# Patient Record
Sex: Female | Born: 1935 | Race: White | Hispanic: No | Marital: Married | Smoking: Never smoker
Health system: Southern US, Community
[De-identification: ages and names within clinical notes are randomized; demographics above are authoritative.]

## PROBLEM LIST (undated history)

## (undated) ENCOUNTER — Ambulatory Visit: Admission: EM | Payer: Medicare Other

## (undated) DIAGNOSIS — N183 Chronic kidney disease, stage 3 unspecified: Secondary | ICD-10-CM

## (undated) DIAGNOSIS — I499 Cardiac arrhythmia, unspecified: Secondary | ICD-10-CM

## (undated) DIAGNOSIS — Z974 Presence of external hearing-aid: Secondary | ICD-10-CM

## (undated) DIAGNOSIS — K219 Gastro-esophageal reflux disease without esophagitis: Secondary | ICD-10-CM

## (undated) DIAGNOSIS — K449 Diaphragmatic hernia without obstruction or gangrene: Secondary | ICD-10-CM

## (undated) DIAGNOSIS — N189 Chronic kidney disease, unspecified: Secondary | ICD-10-CM

## (undated) DIAGNOSIS — Z950 Presence of cardiac pacemaker: Secondary | ICD-10-CM

## (undated) DIAGNOSIS — C801 Malignant (primary) neoplasm, unspecified: Secondary | ICD-10-CM

## (undated) DIAGNOSIS — Z8679 Personal history of other diseases of the circulatory system: Secondary | ICD-10-CM

## (undated) DIAGNOSIS — I442 Atrioventricular block, complete: Secondary | ICD-10-CM

## (undated) DIAGNOSIS — R0600 Dyspnea, unspecified: Secondary | ICD-10-CM

## (undated) DIAGNOSIS — M1990 Unspecified osteoarthritis, unspecified site: Secondary | ICD-10-CM

## (undated) DIAGNOSIS — K224 Dyskinesia of esophagus: Secondary | ICD-10-CM

## (undated) DIAGNOSIS — I469 Cardiac arrest, cause unspecified: Secondary | ICD-10-CM

## (undated) DIAGNOSIS — R011 Cardiac murmur, unspecified: Secondary | ICD-10-CM

## (undated) DIAGNOSIS — B029 Zoster without complications: Secondary | ICD-10-CM

## (undated) DIAGNOSIS — C4372 Malignant melanoma of left lower limb, including hip: Secondary | ICD-10-CM

## (undated) DIAGNOSIS — C50919 Malignant neoplasm of unspecified site of unspecified female breast: Secondary | ICD-10-CM

## (undated) DIAGNOSIS — R609 Edema, unspecified: Secondary | ICD-10-CM

## (undated) DIAGNOSIS — I4891 Unspecified atrial fibrillation: Secondary | ICD-10-CM

## (undated) DIAGNOSIS — I495 Sick sinus syndrome: Secondary | ICD-10-CM

## (undated) DIAGNOSIS — I2510 Atherosclerotic heart disease of native coronary artery without angina pectoris: Secondary | ICD-10-CM

## (undated) DIAGNOSIS — E785 Hyperlipidemia, unspecified: Secondary | ICD-10-CM

## (undated) DIAGNOSIS — I509 Heart failure, unspecified: Secondary | ICD-10-CM

## (undated) DIAGNOSIS — Z9889 Other specified postprocedural states: Secondary | ICD-10-CM

## (undated) DIAGNOSIS — Z7902 Long term (current) use of antithrombotics/antiplatelets: Secondary | ICD-10-CM

## (undated) DIAGNOSIS — G453 Amaurosis fugax: Secondary | ICD-10-CM

## (undated) DIAGNOSIS — H9190 Unspecified hearing loss, unspecified ear: Secondary | ICD-10-CM

## (undated) DIAGNOSIS — D71 Functional disorders of polymorphonuclear neutrophils: Secondary | ICD-10-CM

## (undated) DIAGNOSIS — B019 Varicella without complication: Secondary | ICD-10-CM

## (undated) DIAGNOSIS — I219 Acute myocardial infarction, unspecified: Secondary | ICD-10-CM

## (undated) DIAGNOSIS — I252 Old myocardial infarction: Secondary | ICD-10-CM

## (undated) HISTORY — PX: CHOLECYSTECTOMY: SHX55

## (undated) HISTORY — DX: Unspecified atrial fibrillation: I48.91

## (undated) HISTORY — PX: CARDIAC CATHETERIZATION: SHX172

## (undated) HISTORY — PX: PACEMAKER INSERTION: SHX728

## (undated) HISTORY — PX: OTHER SURGICAL HISTORY: SHX169

## (undated) HISTORY — PX: DILATION AND CURETTAGE OF UTERUS: SHX78

## (undated) HISTORY — PX: ATRIAL ABLATION SURGERY: SHX560

## (undated) HISTORY — PX: ESOPHAGOGASTRODUODENOSCOPY: SHX1529

## (undated) HISTORY — PX: COLONOSCOPY: SHX174

## (undated) HISTORY — DX: Atherosclerotic heart disease of native coronary artery without angina pectoris: I25.10

## (undated) HISTORY — PX: BREAST SURGERY: SHX581

## (undated) HISTORY — PX: ROTATOR CUFF REPAIR: SHX139

## (undated) HISTORY — DX: Old myocardial infarction: I25.2

## (undated) HISTORY — PX: TONSILLECTOMY: SUR1361

---

## 2009-08-23 DIAGNOSIS — I214 Non-ST elevation (NSTEMI) myocardial infarction: Secondary | ICD-10-CM

## 2009-08-23 HISTORY — DX: Non-ST elevation (NSTEMI) myocardial infarction: I21.4

## 2009-10-23 DIAGNOSIS — I469 Cardiac arrest, cause unspecified: Secondary | ICD-10-CM

## 2009-10-23 HISTORY — DX: Cardiac arrest, cause unspecified: I46.9

## 2011-11-24 ENCOUNTER — Emergency Department: Payer: Self-pay | Admitting: Emergency Medicine

## 2011-11-24 ENCOUNTER — Ambulatory Visit: Payer: Self-pay

## 2011-11-24 LAB — COMPREHENSIVE METABOLIC PANEL
Albumin: 3.3 g/dL — ABNORMAL LOW (ref 3.4–5.0)
Alkaline Phosphatase: 95 U/L (ref 50–136)
Anion Gap: 9 (ref 7–16)
Bilirubin,Total: 0.3 mg/dL (ref 0.2–1.0)
Calcium, Total: 8.6 mg/dL (ref 8.5–10.1)
Co2: 28 mmol/L (ref 21–32)
Creatinine: 0.74 mg/dL (ref 0.60–1.30)
EGFR (African American): >60
EGFR (Non-African Amer.): >60
Osmolality: 290 (ref 275–301)

## 2011-11-24 LAB — CBC
HCT: 42.6 % (ref 35.0–47.0)
HGB: 14.3 g/dL (ref 12.0–16.0)
MCHC: 33.5 g/dL (ref 32.0–36.0)
MCV: 95 fL (ref 80–100)
RDW: 13.6 % (ref 11.5–14.5)
WBC: 6 10*3/uL (ref 3.6–11.0)

## 2011-11-24 LAB — CK TOTAL AND CKMB (NOT AT ARMC): CK-MB: 2.4 ng/mL (ref 0.5–3.6)

## 2011-11-27 ENCOUNTER — Ambulatory Visit (INDEPENDENT_AMBULATORY_CARE_PROVIDER_SITE_OTHER): Payer: Medicare Other | Admitting: Cardiovascular Disease

## 2011-11-27 ENCOUNTER — Encounter: Payer: Self-pay | Admitting: Cardiovascular Disease

## 2011-11-27 VITALS — BP 135/81 | HR 120 | Ht 69.0 in | Wt 177.0 lb

## 2011-11-27 DIAGNOSIS — I2510 Atherosclerotic heart disease of native coronary artery without angina pectoris: Secondary | ICD-10-CM | POA: Insufficient documentation

## 2011-11-27 DIAGNOSIS — I4891 Unspecified atrial fibrillation: Secondary | ICD-10-CM | POA: Insufficient documentation

## 2011-11-27 MED ORDER — METOPROLOL TARTRATE 50 MG PO TABS: 50.0000 mg | ORAL_TABLET | Freq: Two times a day (BID) | ORAL | Status: DC

## 2011-11-27 MED ORDER — RIVAROXABAN 20 MG PO TABS: 20.0000 mg | ORAL_TABLET | ORAL | Status: DC

## 2011-11-27 NOTE — Patient Instructions (Addendum)
Your physician wants you to follow-up in: 1 month with Dr. Elease Hashimoto. You will receive a reminder letter in the mail two months in advance. If you don't receive a letter, please call our office to schedule the follow-up appointment.  Your physician recommends that you return for lab work in 1 month 1 week prior to appointment.  Make sure you are fasting.  Nothing to eat/drink after midnight.   Your physician has recommended you make the following change in your medication:   1)stop cardizem  2)stop sotolol  3)start metoprolol 50 mg twice daily  4)start xeralto 20 mg daily  Check BP daily and call us with readings.

## 2011-11-27 NOTE — Assessment & Plan Note (Signed)
Charlotte Glover presents with rapid atrial fibrillation. She has a long history of atrial ablation controlled with sotalol in the past but she is not as well controlled. She's tried taking extra doses of sotalol but did not convert. She was seen in the [facility] emergency room this past weekend.  She's not very symptomatically. I would favor anticoagulation with rate control. She's never tried rate control past.  We discussed all the various options including amiodarone, Tikosyn,  rate control with anticoagulation, and a A. fib ablation. She enjoys getting out playing golf and has not been all that symptomatic playing golf.  We'll start her on metoprolol 50 mg twice a day. She'll discontinue the Cardizem since she's out of it anyway. She'll discontinue the sotalol. We will start her on Xarelto 20 mg a day.  She will take her blood pressure and heart rate on regular basis. I'll see her again in one month for an office visit, fasting labs, and EKG.

## 2011-11-27 NOTE — Progress Notes (Signed)
Charlotte Glover Date of Birth  1928/03/19       [facility]    [place] [address]  [address] [place], [place]  [address]   [place], [place]  [address] [id number]     [phone]   Fax  [phone]    Fax [phone]  Problem List: 1. Coronary artery disease- history of MI( March 2011) 2. History of atrial fibrillation- dx May, 2009 - fairly well controlled on sotalol 3. Cardiac arrest - May 2011-  her husband resuscitated her. Apparently there was no etiology found for cardiac arrest. 3. Hyperlipidemia 4. Hypertension 5. Cholecystectomy  History of Present Illness:  Pt has a hx of CAD and A-fib in the past.  She recently moved from [place], [place].    She went back into A-Fib this weekend and came to [facility] for eval.  She took an extra Sotolol 40 mg but did not convert. She took  160 mg of Sotolol but still did not convert.  She has some fatigue but otherwise does not have any significant problems with the a-Fib.  Has a history of a myocardial infarction in the past but she's not had any episodes of chest pain.  Current Outpatient Prescriptions on File Prior to Visit  Medication Sig Dispense Refill  . diltiazem (CARDIZEM) 60 MG tablet Take 60 mg by mouth daily.      . Diphenhydramine-APAP, sleep, (TYLENOL PM EXTRA STRENGTH PO) Take by mouth as needed.      Marland Kitchen esomeprazole (NEXIUM) 20 MG packet Take 20 mg by mouth daily before breakfast.      . furosemide (LASIX) 40 MG tablet Take 40 mg by mouth daily.      . pravastatin (PRAVACHOL) 40 MG tablet Take 40 mg by mouth daily.      . sotalol (BETAPACE) 80 MG tablet Take 80 mg by mouth 2 (two) times daily.        No Known Allergies  Past Medical History  Diagnosis Date  . Hypertension   . Hyperlipidemia   . Coronary artery disease   . Atrial fibrillation   . History of MI (myocardial infarction)     Past Surgical History  Procedure Date  . Cardiac catheterization   .  Rotator cuff repair   . Cholecystectomy     History  Smoking status  . Former Smoker  . Types: Cigarettes  Smokeless tobacco  . Not on file    History  Alcohol Use  . 0.6 oz/week  . 1 Glasses of wine per week    daily    History reviewed. No pertinent family history.  Reviw of Systems:  Reviewed in the HPI.  All other systems are negative.  Physical Exam: Blood pressure 135/81, pulse 120, height 5\' 9"  (1.753 m), weight 177 lb (80.287 kg). General: Well developed, well nourished, in no acute distress.  Head: Normocephalic, atraumatic, sclera non-icteric, mucus membranes are moist,   Neck: Supple. Carotids are 2 + without bruits. No JVD  Lungs: Clear bilaterally to auscultation.  Heart: irregularly irregular.  normal  S1 S2. No murmurs, gallops or rubs. Her heart rate is tachycardic.  Abdomen: Soft, non-tender, non-distended with normal bowel sounds. No hepatomegaly. No rebound/guarding. No masses.  Msk:  Strength and tone are normal  Extremities: No clubbing or cyanosis. No edema.  Distal pedal pulses are 2+ and equal bilaterally.  Neuro: Alert and oriented X 3. Moves all extremities spontaneously.  Psych:  Responds to  questions appropriately with a normal affect.  ECG: November 24, 2011, a-Fib with rapid vent. Rate.  Assessment / Plan:

## 2011-11-28 ENCOUNTER — Telehealth: Payer: Self-pay | Admitting: Cardiovascular Disease

## 2011-11-28 NOTE — Telephone Encounter (Signed)
Pt aware of Dr. Harvie Bridge recommendations regarding lasix and agrees with plan Mylo Red RN

## 2011-11-28 NOTE — Telephone Encounter (Signed)
She may double her lasix today and tomorrow.  Then return to normal dose.

## 2011-11-28 NOTE — Telephone Encounter (Signed)
New msg Pt wants to talk to you about swelling in her legs. She wants to know should she double her lasix med.

## 2011-11-28 NOTE — Telephone Encounter (Signed)
I spoke with pt husband. Pt is at another md appt. She has had a weight gain of 3 pounds overnight and increased swelling of the ankles. Husband states pt is not short of breath and no angina. "she feels fine".  0700       BP 119/80 HR 100 1000 am  BP 140/76 HR 98 Pt is not sure if she should take another lasix. She will be home later today. I will forward to Dr. Elease Hashimoto for review. Mylo Red RN

## 2011-12-03 ENCOUNTER — Telehealth: Payer: Self-pay | Admitting: Cardiovascular Disease

## 2011-12-03 NOTE — Telephone Encounter (Signed)
Pt calling concerning her Bp and pulse rate would like to speak to a nurse

## 2011-12-03 NOTE — Telephone Encounter (Signed)
Pt was seen last week by Dr. Elease Hashimoto. Some changes were made to her meds and she was told to keep a log of BP and HR.  She is calling with some concerns re: both.  She tells me she wakes up daily with HR=120's.  She takes meds (metoprolol 50 BID) and HR decreases slightly. It then jumps back up to 120's in the PM.  She also says her DBP stays above 100 (110-115) since med changes as well.  She denies h/a, blurred vision, slurred speech. Her only complaint is "slight dizziness". She is anxious to start playing golf again and start exercising.  I told her I would review these with Dr. Elease Hashimoto and call her back. Understanding verb.

## 2011-12-04 ENCOUNTER — Other Ambulatory Visit: Payer: Self-pay

## 2011-12-04 MED ORDER — DILTIAZEM HCL ER COATED BEADS 120 MG PO CP24: 120.0000 mg | ORAL_CAPSULE | Freq: Every day | ORAL | Status: DC

## 2011-12-04 NOTE — Telephone Encounter (Signed)
Discussed with Dr. Mariah Milling who suggests resuming cardizem 120 mg daily, have pt monitor BP/HR closely. If BP/HR cannot tolerate med change, we may need to change metoprolol dose.  He also wants to make sure pt has f/u appt scheduled with Dr. Elease Hashimoto.  Will call pt to advise.

## 2011-12-04 NOTE — Telephone Encounter (Signed)
Discussed with pt. Understanding verb. RX sent to pharm.  She will monitor BP and HR and call us if she cannot tolerate med change.

## 2011-12-04 NOTE — Telephone Encounter (Signed)
Fu call °Patient calling back about this issue °

## 2011-12-04 NOTE — Telephone Encounter (Signed)
Has appt with Dr. Elease Hashimoto in July

## 2011-12-25 ENCOUNTER — Other Ambulatory Visit: Payer: Medicare Other

## 2012-01-07 ENCOUNTER — Other Ambulatory Visit: Payer: Medicare Other

## 2012-01-16 ENCOUNTER — Ambulatory Visit: Payer: Medicare Other | Admitting: Cardiovascular Disease

## 2012-01-23 DIAGNOSIS — I10 Essential (primary) hypertension: Secondary | ICD-10-CM | POA: Insufficient documentation

## 2012-01-23 HISTORY — DX: Essential (primary) hypertension: I10

## 2012-02-08 DIAGNOSIS — I38 Endocarditis, valve unspecified: Secondary | ICD-10-CM

## 2012-02-08 HISTORY — DX: Endocarditis, valve unspecified: I38

## 2012-03-24 HISTORY — PX: ATRIAL FIBRILLATION ABLATION: SHX5732

## 2012-04-02 HISTORY — PX: CARDIOVERSION: SHX1299

## 2012-04-17 ENCOUNTER — Emergency Department: Payer: Self-pay | Admitting: Emergency Medicine

## 2012-04-17 LAB — URINALYSIS, COMPLETE
Bacteria: NONE SEEN
Bilirubin,UR: NEGATIVE
Blood: NEGATIVE
Glucose,UR: NEGATIVE mg/dL (ref 0–75)
Leukocyte Esterase: NEGATIVE
Nitrite: NEGATIVE
Protein: NEGATIVE
RBC,UR: <1 /HPF (ref 0–5)
WBC UR: <1 /HPF (ref 0–5)

## 2012-04-17 LAB — BASIC METABOLIC PANEL
Anion Gap: 12 (ref 7–16)
Calcium, Total: 8.6 mg/dL (ref 8.5–10.1)
EGFR (Non-African Amer.): >60
Osmolality: 282 (ref 275–301)
Potassium: 4.4 mmol/L (ref 3.5–5.1)

## 2012-04-17 LAB — CBC
HCT: 41.9 % (ref 35.0–47.0)
HGB: 14 g/dL (ref 12.0–16.0)
MCH: 30.8 pg (ref 26.0–34.0)
MCHC: 33.3 g/dL (ref 32.0–36.0)
MCV: 93 fL (ref 80–100)
Platelet: 257 10*3/uL (ref 150–440)
RBC: 4.53 10*6/uL (ref 3.80–5.20)
RDW: 14.6 % — ABNORMAL HIGH (ref 11.5–14.5)

## 2012-04-28 ENCOUNTER — Ambulatory Visit: Payer: Self-pay

## 2012-04-28 LAB — CREATININE, SERUM
Creatinine: 0.88 mg/dL (ref 0.60–1.30)
EGFR (African American): >60

## 2012-07-07 ENCOUNTER — Ambulatory Visit: Payer: Self-pay

## 2012-07-11 DIAGNOSIS — Z8679 Personal history of other diseases of the circulatory system: Secondary | ICD-10-CM | POA: Insufficient documentation

## 2012-07-18 ENCOUNTER — Ambulatory Visit: Payer: Self-pay | Admitting: Gastroenterology

## 2012-07-28 ENCOUNTER — Ambulatory Visit: Payer: Self-pay | Admitting: Gastroenterology

## 2012-08-06 ENCOUNTER — Ambulatory Visit: Payer: Self-pay | Admitting: Gastroenterology

## 2013-01-01 DIAGNOSIS — I219 Acute myocardial infarction, unspecified: Secondary | ICD-10-CM

## 2013-01-01 DIAGNOSIS — I252 Old myocardial infarction: Secondary | ICD-10-CM | POA: Insufficient documentation

## 2013-01-01 DIAGNOSIS — IMO0001 Reserved for inherently not codable concepts without codable children: Secondary | ICD-10-CM | POA: Insufficient documentation

## 2013-01-01 HISTORY — DX: Acute myocardial infarction, unspecified: I21.9

## 2013-01-02 HISTORY — PX: ATRIAL FIBRILLATION ABLATION: SHX5732

## 2013-01-15 HISTORY — PX: CARDIOVERSION: SHX1299

## 2013-05-30 ENCOUNTER — Emergency Department: Payer: Self-pay | Admitting: Internal Medicine

## 2013-05-30 LAB — CBC
HGB: 14.3 g/dL (ref 12.0–16.0)
MCV: 88 fL (ref 80–100)
RDW: 15.3 % — ABNORMAL HIGH (ref 11.5–14.5)
WBC: 6.6 10*3/uL (ref 3.6–11.0)

## 2013-05-30 LAB — TSH: Thyroid Stimulating Horm: 1.04 u[IU]/mL

## 2013-05-30 LAB — TROPONIN I: Troponin-I: <0.02 ng/mL

## 2013-05-30 LAB — COMPREHENSIVE METABOLIC PANEL
Alkaline Phosphatase: 93 U/L
Anion Gap: 10 (ref 7–16)
Calcium, Total: 9.1 mg/dL (ref 8.5–10.1)
EGFR (African American): >60
EGFR (Non-African Amer.): >60
Total Protein: 6.5 g/dL (ref 6.4–8.2)

## 2013-06-08 ENCOUNTER — Ambulatory Visit: Payer: Self-pay | Admitting: Cardiology

## 2013-06-08 LAB — CBC WITH DIFFERENTIAL/PLATELET
Basophil #: 0 10*3/uL (ref 0.0–0.1)
Eosinophil #: 0.3 10*3/uL (ref 0.0–0.7)
HCT: 43.6 % (ref 35.0–47.0)
Lymphocyte %: 21.9 %
MCHC: 33.2 g/dL (ref 32.0–36.0)
MCV: 89 fL (ref 80–100)
Neutrophil #: 4.1 10*3/uL (ref 1.4–6.5)
Neutrophil %: 63.9 %
RBC: 4.91 10*6/uL (ref 3.80–5.20)

## 2013-06-08 LAB — PROTIME-INR
INR: 1.2
Prothrombin Time: 15.3 secs — ABNORMAL HIGH (ref 11.5–14.7)

## 2013-06-08 LAB — BASIC METABOLIC PANEL
Anion Gap: 4 — ABNORMAL LOW (ref 7–16)
Calcium, Total: 9.4 mg/dL (ref 8.5–10.1)
Co2: 32 mmol/L (ref 21–32)
EGFR (Non-African Amer.): >60
Osmolality: 279 (ref 275–301)
Sodium: 138 mmol/L (ref 136–145)

## 2013-06-11 ENCOUNTER — Ambulatory Visit: Payer: Self-pay | Admitting: Cardiology

## 2013-06-25 DIAGNOSIS — C50919 Malignant neoplasm of unspecified site of unspecified female breast: Secondary | ICD-10-CM

## 2013-06-25 HISTORY — PX: BREAST BIOPSY: SHX20

## 2013-06-25 HISTORY — DX: Malignant neoplasm of unspecified site of unspecified female breast: C50.919

## 2013-07-07 ENCOUNTER — Ambulatory Visit: Payer: Self-pay

## 2013-07-08 ENCOUNTER — Ambulatory Visit: Payer: Self-pay

## 2013-07-13 ENCOUNTER — Ambulatory Visit: Payer: Self-pay

## 2013-07-13 DIAGNOSIS — D0511 Intraductal carcinoma in situ of right breast: Secondary | ICD-10-CM

## 2013-07-13 HISTORY — DX: Intraductal carcinoma in situ of right breast: D05.11

## 2013-07-17 LAB — PATHOLOGY REPORT

## 2013-08-11 DIAGNOSIS — D0510 Intraductal carcinoma in situ of unspecified breast: Secondary | ICD-10-CM | POA: Insufficient documentation

## 2013-08-11 DIAGNOSIS — Z86000 Personal history of in-situ neoplasm of breast: Secondary | ICD-10-CM | POA: Insufficient documentation

## 2013-11-10 ENCOUNTER — Ambulatory Visit: Payer: Self-pay | Admitting: Unknown Physician Specialty

## 2013-11-24 DIAGNOSIS — C439 Malignant melanoma of skin, unspecified: Secondary | ICD-10-CM | POA: Insufficient documentation

## 2013-11-24 DIAGNOSIS — Z85820 Personal history of malignant melanoma of skin: Secondary | ICD-10-CM | POA: Insufficient documentation

## 2014-03-16 DIAGNOSIS — R519 Headache, unspecified: Secondary | ICD-10-CM | POA: Insufficient documentation

## 2014-03-16 DIAGNOSIS — R5383 Other fatigue: Secondary | ICD-10-CM | POA: Insufficient documentation

## 2014-04-06 DIAGNOSIS — R52 Pain, unspecified: Secondary | ICD-10-CM | POA: Insufficient documentation

## 2014-05-06 DIAGNOSIS — G479 Sleep disorder, unspecified: Secondary | ICD-10-CM | POA: Insufficient documentation

## 2014-05-18 DIAGNOSIS — Z950 Presence of cardiac pacemaker: Secondary | ICD-10-CM | POA: Insufficient documentation

## 2014-05-26 HISTORY — PX: AV NODE ABLATION: SHX1209

## 2014-05-28 ENCOUNTER — Emergency Department: Payer: Self-pay | Admitting: Emergency Medicine

## 2014-05-28 HISTORY — PX: PACEMAKER INSERTION: SHX728

## 2014-05-28 LAB — CBC
HCT: 43 % (ref 35.0–47.0)
HGB: 14 g/dL (ref 12.0–16.0)
MCH: 29.7 pg (ref 26.0–34.0)
MCHC: 32.5 g/dL (ref 32.0–36.0)
MCV: 91 fL (ref 80–100)
PLATELETS: 236 10*3/uL (ref 150–440)
RBC: 4.7 10*6/uL (ref 3.80–5.20)
RDW: 15 % — ABNORMAL HIGH (ref 11.5–14.5)
WBC: 7.9 10*3/uL (ref 3.6–11.0)

## 2014-05-28 LAB — BASIC METABOLIC PANEL
Anion Gap: 9 (ref 7–16)
BUN: 15 mg/dL (ref 7–18)
CHLORIDE: 103 mmol/L (ref 98–107)
CO2: 29 mmol/L (ref 21–32)
CREATININE: 0.87 mg/dL (ref 0.60–1.30)
Calcium, Total: 8.6 mg/dL (ref 8.5–10.1)
EGFR (African American): >60
EGFR (Non-African Amer.): >60
GLUCOSE: 108 mg/dL — AB (ref 65–99)
Osmolality: 283 (ref 275–301)
POTASSIUM: 4.3 mmol/L (ref 3.5–5.1)
SODIUM: 141 mmol/L (ref 136–145)

## 2014-05-28 LAB — TROPONIN I
Troponin-I: 0.63 ng/mL — ABNORMAL HIGH
Troponin-I: 0.57 ng/mL — ABNORMAL HIGH

## 2014-05-28 LAB — CK-MB
CK-MB: 2.5 ng/mL (ref 0.5–3.6)
CK-MB: 2.1 ng/mL (ref 0.5–3.6)

## 2014-05-28 LAB — PROTIME-INR
INR: 1.4
PROTHROMBIN TIME: 17 s — AB (ref 11.5–14.7)

## 2014-06-30 DIAGNOSIS — I498 Other specified cardiac arrhythmias: Secondary | ICD-10-CM | POA: Insufficient documentation

## 2014-07-15 ENCOUNTER — Ambulatory Visit: Payer: Self-pay | Admitting: Specialist

## 2014-10-08 ENCOUNTER — Other Ambulatory Visit: Payer: Self-pay | Admitting: Specialist

## 2014-10-08 DIAGNOSIS — R918 Other nonspecific abnormal finding of lung field: Secondary | ICD-10-CM

## 2014-10-15 NOTE — Op Note (Signed)
PATIENT NAME:  Charlotte Glover, Charlotte Glover MR#:  [id number] DATE OF BIRTH:  [birth year]  DATE OF PROCEDURE:  06/11/2013  PRIMARY CARE PHYSICIAN:  Dr. Ola Spurr.  PRIMARY DIAGNOSES: 1.  Sick sinus syndrome.  2.  Atrial fibrillation.   PROCEDURE:  Dual chamber pacemaker implantation.   POSTPROCEDURE DIAGNOSIS:  Atrial and ventricular sensing.   INDICATION:  The patient is an 81 year old female with history of atrial fibrillation status post catheter ablation and redo catheter ablation. The patient has had intermittent sinus bradycardia and has been intolerant of moderate doses of metoprolol, Cardizem or sotalol. The patient has had recurrent paroxysmal atrial fibrillation alternating with episodes of sinus bradycardia. The patient is symptomatic when she is bradycardic with dizziness.   PROCEDURE:  The risks, benefits, and alternatives of dual chamber pacemaker implantation were explained to the patient and informed written consent was obtained.   She was brought to the Operating Room in a fasting state. The left pectoral region was prepped and draped in the usual sterile manner. Anesthesia was obtained with 1% Xylocaine locally. A 6-cm incision was performed over the left pectoral region. The pacemaker pocket was generated by electrocautery and blunt dissection. Access was obtained to the left subclavian vein by fine needle aspiration. The right ventricular and right atrial leads were positioned to the right ventricular septum and right atrial appendage under fluoroscopic guidance. After proper thresholds were obtained, the leads were sutured in place. The pacemaker pocket was irrigated with gentamicin solution and the leads were connected dual-chamber, MRI-compatible, rate responsive pacemaker generator (Medtronic A2D RO1) and positioned into the pocket. The pocket was closed with 2-0 and 4-0 Vicryl, respectively. Steri-Strips and a pressure dressing were applied.  ____________________________ Isaias Cowman, MD ap:jm D: 06/11/2013 14:07:59 ET T: 06/11/2013 14:28:34 ET JOB#: [id number]  cc: Isaias Cowman, MD, <Dictator>   Isaias Cowman MD ELECTRONICALLY SIGNED 06/24/2013 8:39

## 2015-04-05 DIAGNOSIS — G454 Transient global amnesia: Secondary | ICD-10-CM | POA: Insufficient documentation

## 2015-05-23 ENCOUNTER — Ambulatory Visit: Admission: RE | Admit: 2015-05-23 | Payer: Medicare Other | Source: Ambulatory Visit

## 2015-12-28 DIAGNOSIS — R918 Other nonspecific abnormal finding of lung field: Secondary | ICD-10-CM | POA: Insufficient documentation

## 2015-12-29 ENCOUNTER — Other Ambulatory Visit: Payer: Self-pay | Admitting: Infectious Diseases

## 2015-12-29 DIAGNOSIS — R918 Other nonspecific abnormal finding of lung field: Secondary | ICD-10-CM

## 2016-01-03 ENCOUNTER — Ambulatory Visit
Admission: RE | Admit: 2016-01-03 | Discharge: 2016-01-03 | Disposition: A | Discharge status: Home / Self Care | Payer: Medicare Other | Source: Ambulatory Visit | Attending: Infectious Diseases | Admitting: Infectious Diseases

## 2016-01-03 DIAGNOSIS — K449 Diaphragmatic hernia without obstruction or gangrene: Secondary | ICD-10-CM | POA: Insufficient documentation

## 2016-01-03 DIAGNOSIS — R918 Other nonspecific abnormal finding of lung field: Secondary | ICD-10-CM | POA: Insufficient documentation

## 2016-01-03 DIAGNOSIS — I728 Aneurysm of other specified arteries: Secondary | ICD-10-CM | POA: Insufficient documentation

## 2016-01-03 DIAGNOSIS — I2510 Atherosclerotic heart disease of native coronary artery without angina pectoris: Secondary | ICD-10-CM | POA: Diagnosis not present

## 2016-03-06 ENCOUNTER — Other Ambulatory Visit
Admission: RE | Admit: 2016-03-06 | Discharge: 2016-03-06 | Disposition: A | Discharge status: Home / Self Care | Payer: Medicare Other | Source: Ambulatory Visit | Attending: Gastroenterology | Admitting: Gastroenterology

## 2016-03-06 DIAGNOSIS — R197 Diarrhea, unspecified: Secondary | ICD-10-CM | POA: Diagnosis present

## 2016-03-07 LAB — GASTROINTESTINAL PANEL BY PCR, STOOL (REPLACES STOOL CULTURE)
ADENOVIRUS F40/41: NOT DETECTED
Astrovirus: NOT DETECTED
CAMPYLOBACTER SPECIES: NOT DETECTED
CRYPTOSPORIDIUM: NOT DETECTED
CYCLOSPORA CAYETANENSIS: NOT DETECTED
E. coli O157: NOT DETECTED
ENTEROPATHOGENIC E COLI (EPEC): DETECTED — AB
Entamoeba histolytica: NOT DETECTED
Enteroaggregative E coli (EAEC): NOT DETECTED
Enterotoxigenic E coli (ETEC): NOT DETECTED
Giardia lamblia: NOT DETECTED
Norovirus GI/GII: NOT DETECTED
PLESIMONAS SHIGELLOIDES: NOT DETECTED
ROTAVIRUS A: NOT DETECTED
SALMONELLA SPECIES: NOT DETECTED
SHIGA LIKE TOXIN PRODUCING E COLI (STEC): NOT DETECTED
SHIGELLA/ENTEROINVASIVE E COLI (EIEC): NOT DETECTED
Sapovirus (I, II, IV, and V): NOT DETECTED
VIBRIO SPECIES: NOT DETECTED
Vibrio cholerae: NOT DETECTED
YERSINIA ENTEROCOLITICA: NOT DETECTED

## 2016-04-04 ENCOUNTER — Encounter: Payer: Self-pay | Admitting: Emergency Medicine

## 2016-04-04 ENCOUNTER — Ambulatory Visit
Admission: EM | Admit: 2016-04-04 | Discharge: 2016-04-04 | Disposition: A | Discharge status: Home / Self Care | Payer: Medicare Other | Attending: Family Medicine | Admitting: Family Medicine

## 2016-04-04 ENCOUNTER — Ambulatory Visit: Payer: Medicare Other

## 2016-04-04 DIAGNOSIS — X58XXXA Exposure to other specified factors, initial encounter: Secondary | ICD-10-CM | POA: Insufficient documentation

## 2016-04-04 DIAGNOSIS — M79605 Pain in left leg: Secondary | ICD-10-CM

## 2016-04-04 DIAGNOSIS — S93145A Subluxation of metatarsophalangeal joint of left lesser toe(s), initial encounter: Secondary | ICD-10-CM | POA: Diagnosis not present

## 2016-04-04 DIAGNOSIS — M79672 Pain in left foot: Secondary | ICD-10-CM | POA: Diagnosis present

## 2016-04-04 DIAGNOSIS — S8012XA Contusion of left lower leg, initial encounter: Secondary | ICD-10-CM

## 2016-04-04 NOTE — ED Provider Notes (Signed)
[facility] ____________________________________________  Time seen: Approximately 3:54 PM  I have reviewed the triage vital signs and the nursing notes.   HISTORY  Chief Complaint Foot Pain   HPI Charlotte Glover is a 82 y.o. female presents for complaint of left lower leg pain. Patient presents this past Saturday she was walking through her house and accidentally ran into her dishwasher corner. Patient reports this occurred on Saturday. Patient reports she had immediate pain in the same location after injury that has continued. Patient reports pain when walking. Patient states that pain has slightly improved from Saturday but pain continues. Denies pain radiation. Denies any numbness or tingling sensation. Patient reports she has chronic bilateral lower extremity swelling, but states she has more swelling directly around pain site. Denies any calf pain or calf swelling. Reports has remained ambulatory. Reports has iced and elevated some.  Denies fall, head injury or loss of consciousness. Denies recent consultation, recent immobilization. Denies chest pain, shortness of breath, dizziness, weakness, paresthesias or other complaints. Reports currently taking Xarelto.  Leonel Ramsay, MD: PCP   Past Medical History:  Diagnosis Date  . Atrial fibrillation ([place])   . Coronary artery disease   . History of MI (myocardial infarction)   . Hyperlipidemia   . Hypertension     Patient Active Problem List   Diagnosis Date Noted  . Atrial fibrillation ([place]) 11/27/2011  . Coronary artery disease 11/27/2011    Past Surgical History:  Procedure Laterality Date  . CARDIAC CATHETERIZATION    . CHOLECYSTECTOMY    . ROTATOR CUFF REPAIR      Current Outpatient Rx  . Order #: [id number] Class: Historical Med  . Order #: [id number] Class: Historical Med  . Order #: [id number] Class: Historical Med  . Order #: [id number] Class: Historical Med  . Order #: [id number] Class:  Historical Med  . Order #: [id number] Class: Normal    No current facility-administered medications for this encounter.   Current Outpatient Prescriptions:  .  gabapentin (NEURONTIN) 300 MG capsule, Take 900 mg by mouth 2 (two) times daily., Disp: , Rfl:  .  omeprazole (PRILOSEC) 20 MG capsule, Take 20 mg by mouth 2 (two) times daily before a meal., Disp: , Rfl:  .  acetaminophen (TYLENOL) 325 MG tablet, Take 325 mg by mouth every 6 (six) hours as needed., Disp: , Rfl:  .  Multiple Vitamin (MULTIVITAMIN) tablet, Take 1 tablet by mouth daily., Disp: , Rfl:  .  Multiple Vitamins-Minerals (PRESERVISION AREDS PO), Take by mouth daily., Disp: , Rfl:  .  Rivaroxaban (XARELTO) 20 MG TABS, Take 20 mg by mouth 1 day or 1 dose., Disp: 30 tablet, Rfl: 0  Allergies Review of patient's allergies indicates no known allergies.  History reviewed. No pertinent family history.  Social History Social History  Substance Use Topics  . Smoking status: Former Smoker    Types: Cigarettes  . Smokeless tobacco: Never Used  . Alcohol use 0.6 oz/week    1 Glasses of wine per week     Comment: daily    Review of Systems Constitutional: No fever/chills Eyes: No visual changes. ENT: No sore throat. Cardiovascular: Denies chest pain. Respiratory: Denies shortness of breath. Gastrointestinal: No abdominal pain.  No nausea, no vomiting.  No diarrhea.  No constipation. Genitourinary: Negative for dysuria. Musculoskeletal: Negative for back pain.As above.  Skin: Negative for rash. Neurological: Negative for headaches, focal weakness or numbness.  10-point ROS otherwise negative.  ____________________________________________   PHYSICAL EXAM:  VITAL SIGNS: ED Triage  Vitals  Enc Vitals Group     BP 04/04/16 1538 (!) 155/68     Pulse Rate 04/04/16 1538 78     Resp 04/04/16 1538 16     Temp 04/04/16 1538 97.9 F (36.6 C)     Temp Source 04/04/16 1538 Oral     SpO2 04/04/16 1538 99 %     Weight  04/04/16 1538 170 lb (77.1 kg)     Height 04/04/16 1538 5\' 7"  (1.702 m)     Head Circumference --      Peak Flow --      Pain Score 04/04/16 1542 6     Pain Loc --      Pain Edu? --      Excl. in [place]? --     Constitutional: Alert and oriented. Well appearing and in no acute distress. Eyes: Conjunctivae are normal. PERRL. EOMI. ENT      Head: Normocephalic and atraumatic.      Mouth/Throat: Mucous membranes are moist. Cardiovascular: Normal rate, regular rhythm. Grossly normal heart sounds.  Good peripheral circulation. Respiratory: Normal respiratory effort without tachypnea nor retractions. Breath sounds are clear and equal bilaterally. No wheezes/rales/rhonchi.. Musculoskeletal:  Ambulatory with steady gait.  Except: left lower lateral fibula area moderate tenderness to palpation, left lateral malleolus mild tenderness to palpation, no erythema, no ecchymosis, left lateral shin wound present that patient reports this is from a biopsy site and is healing well and denies complaints regarding biopsy site without surrounding erythema to wound, patient with bilateral lower extremity pitting edema to bilateral ankles and feet, left distal fibular area also with swelling, no calf tenderness bilaterally. Bilateral pedal pulses equal and easily palpated. Left lower extremity otherwise nontender. Left distal foot normal capillary refill.  Neurologic:  Normal speech and language. No gross focal neurologic deficits are appreciated. Speech is normal. No gait instability.  Skin:  Skin is warm, dry and intact. No rash noted. Psychiatric: Mood and affect are normal. Speech and behavior are normal. Patient exhibits appropriate insight and judgment   ___________________________________________   LABS (all labs ordered are listed, but only abnormal results are displayed)  Labs Reviewed - No data to display  RADIOLOGY  Dg Tibia/fibula Left  Result Date: 04/04/2016 CLINICAL DATA:  Injury to the  lateral lower leg 4 days ago. Generalized pain with moderate swelling. EXAM: LEFT TIBIA AND FIBULA - 2 VIEW COMPARISON:  None. FINDINGS: Tibia and fibula are intact without a fracture. Irregularity and degenerative changes at the medial malleolus and medial ankle joint. There is evidence for subcutaneous edema in the left lower leg. IMPRESSION: No acute bone abnormality in left lower leg. Electronically Signed   By: Markus Daft M.D.   On: 04/04/2016 16:42   Dg Foot Complete Left  Result Date: 04/04/2016 CLINICAL DATA:  Lateral distal pain and swelling. EXAM: LEFT FOOT - COMPLETE 3+ VIEW COMPARISON:  None. FINDINGS: Subluxation at the DIP joint of the left little toe. No fracture visualized. Soft tissue swelling noted along the dorsum of the foot. Plantar calcaneal spur. IMPRESSION: Subluxation of the DIP joint of the left little toe. Electronically Signed   By: Rolm Baptise M.D.   On: 04/04/2016 16:43   ____________________________________________   PROCEDURES Procedures    INITIAL IMPRESSION / ASSESSMENT AND PLAN / ED COURSE  Pertinent labs & imaging results that were available during my care of the patient were reviewed by me and considered in my medical decision making (see chart for details).  Well-appearing  patient. No acute distress. Presents for complaints of left lower extremity pain post mechanical injury this past weekend. Patient with point bony tenderness, and per patient pain is fully reproducible by palpation, pain to distal lateral fibula and lateral left malleolus. Will evaluate x-rays. Reports chronic bilateral lower extremity swelling.  Per radiologist left tib-fib x-ray no acute bony abnormality of the left lower leg. The radiologist left foot x-ray subluxation at the DIP joint of the little left toe. Discussed in detail with patient x-ray results. Left fifth toe nontender to palpation, and patient denies injury to this toe. Patient further states left fifth toe has always had  a subluxation and she has multiple injuries in the past to her left fifth toe. Patient states left fifth toe view on x-ray is old. Suspect contusion injuries with local pain. Encourage patient to elevate, ice. Velcro stirrup splint applied for support. Patient uses cane as baseline and denies need for additional assistive devices or walker. Patient ambulatory with steady gait. Patient denies need for prescription medications. Denies any other complaints. Patient reports that she follows with Dr. Rudene Christians orthopedic as needed and will follow up for any continued pain. Counsel patient to monitor swelling closely at home.   Discussed follow up with Primary care physician this week. Discussed follow up and return parameters including no resolution or any worsening concerns. Patient verbalized understanding and agreed to plan.   ____________________________________________   FINAL CLINICAL IMPRESSION(S) / ED DIAGNOSES  Final diagnoses:  Left leg pain  Contusion of left lower extremity, initial encounter     New Prescriptions   No medications on file    Note: This dictation was prepared with Dragon dictation along with smaller phrase technology. Any transcriptional errors that result from this process are unintentional.    Clinical Course      Marylene Land, NP 04/04/16 [place], NP 04/04/16 1719

## 2016-04-04 NOTE — ED Triage Notes (Signed)
Patient c/o pain in her left foot and left lower leg since Saturday.  Patient states that she hit her left lower leg on the side of the dishwasher on Saturday.

## 2016-04-04 NOTE — Discharge Instructions (Signed)
Elevate. Apply ice.    Follow up with your primary care physician this week as needed. Return to Urgent care for new or worsening concerns.

## 2016-04-12 ENCOUNTER — Other Ambulatory Visit: Payer: Self-pay | Admitting: Neurology

## 2016-04-12 ENCOUNTER — Ambulatory Visit
Admission: RE | Admit: 2016-04-12 | Discharge: 2016-04-12 | Disposition: A | Discharge status: Home / Self Care | Payer: Medicare Other | Source: Ambulatory Visit | Attending: Neurology | Admitting: Neurology

## 2016-04-12 DIAGNOSIS — M79605 Pain in left leg: Secondary | ICD-10-CM

## 2016-04-12 DIAGNOSIS — M7989 Other specified soft tissue disorders: Secondary | ICD-10-CM

## 2016-04-23 DIAGNOSIS — R600 Localized edema: Secondary | ICD-10-CM | POA: Insufficient documentation

## 2016-05-08 ENCOUNTER — Encounter: Payer: Self-pay | Admitting: *Deleted

## 2016-05-08 ENCOUNTER — Encounter
Admission: RE | Disposition: A | Discharge status: Home / Self Care | Payer: Self-pay | Source: Ambulatory Visit | Attending: Cardiology

## 2016-05-08 ENCOUNTER — Ambulatory Visit
Admission: RE | Admit: 2016-05-08 | Discharge: 2016-05-08 | Disposition: A | Discharge status: Home / Self Care | Payer: Medicare Other | Source: Ambulatory Visit | Attending: Cardiology | Admitting: Cardiology

## 2016-05-08 DIAGNOSIS — I4891 Unspecified atrial fibrillation: Secondary | ICD-10-CM | POA: Diagnosis not present

## 2016-05-08 DIAGNOSIS — I340 Nonrheumatic mitral (valve) insufficiency: Secondary | ICD-10-CM | POA: Diagnosis not present

## 2016-05-08 DIAGNOSIS — I10 Essential (primary) hypertension: Secondary | ICD-10-CM | POA: Diagnosis not present

## 2016-05-08 DIAGNOSIS — Z950 Presence of cardiac pacemaker: Secondary | ICD-10-CM | POA: Insufficient documentation

## 2016-05-08 DIAGNOSIS — I252 Old myocardial infarction: Secondary | ICD-10-CM | POA: Insufficient documentation

## 2016-05-08 DIAGNOSIS — I272 Pulmonary hypertension, unspecified: Secondary | ICD-10-CM

## 2016-05-08 DIAGNOSIS — I2510 Atherosclerotic heart disease of native coronary artery without angina pectoris: Secondary | ICD-10-CM | POA: Insufficient documentation

## 2016-05-08 DIAGNOSIS — E785 Hyperlipidemia, unspecified: Secondary | ICD-10-CM | POA: Diagnosis not present

## 2016-05-08 DIAGNOSIS — Z8674 Personal history of sudden cardiac arrest: Secondary | ICD-10-CM | POA: Diagnosis not present

## 2016-05-08 HISTORY — DX: Edema, unspecified: R60.9

## 2016-05-08 HISTORY — PX: CARDIAC CATHETERIZATION: SHX172

## 2016-05-08 HISTORY — DX: Zoster without complications: B02.9

## 2016-05-08 HISTORY — DX: Malignant (primary) neoplasm, unspecified: C80.1

## 2016-05-08 HISTORY — DX: Dyspnea, unspecified: R06.00

## 2016-05-08 HISTORY — DX: Cardiac arrest, cause unspecified: I46.9

## 2016-05-08 HISTORY — DX: Gastro-esophageal reflux disease without esophagitis: K21.9

## 2016-05-08 HISTORY — DX: Acute myocardial infarction, unspecified: I21.9

## 2016-05-08 HISTORY — DX: Varicella without complication: B01.9

## 2016-05-08 HISTORY — DX: Pulmonary hypertension, unspecified: I27.20

## 2016-05-08 HISTORY — DX: Other specified postprocedural states: Z86.79

## 2016-05-08 HISTORY — DX: Unspecified hearing loss, unspecified ear: H91.90

## 2016-05-08 HISTORY — DX: Cardiac arrhythmia, unspecified: I49.9

## 2016-05-08 HISTORY — DX: Other specified postprocedural states: Z98.890

## 2016-05-08 SURGERY — RIGHT/LEFT HEART CATH AND CORONARY ANGIOGRAPHY
Anesthesia: Moderate Sedation | Laterality: Bilateral

## 2016-05-08 MED ORDER — FENTANYL CITRATE (PF) 100 MCG/2ML IJ SOLN
INTRAMUSCULAR | Status: AC
  Filled 2016-05-08: qty 2

## 2016-05-08 MED ORDER — MIDAZOLAM HCL 2 MG/2ML IJ SOLN
INTRAMUSCULAR | Status: DC | PRN
  Administered 2016-05-08: 1 mg via INTRAVENOUS

## 2016-05-08 MED ORDER — IOPAMIDOL (ISOVUE-300) INJECTION 61%
INTRAVENOUS | Status: DC | PRN
Start: 2016-05-08 — End: 2016-05-08
  Administered 2016-05-08: 80 mL via INTRA_ARTERIAL

## 2016-05-08 MED ORDER — FENTANYL CITRATE (PF) 100 MCG/2ML IJ SOLN
INTRAMUSCULAR | Status: DC | PRN
  Administered 2016-05-08: 25 ug via INTRAVENOUS

## 2016-05-08 MED ORDER — HEPARIN (PORCINE) IN NACL 2-0.9 UNIT/ML-% IJ SOLN
INTRAMUSCULAR | Status: AC
  Filled 2016-05-08: qty 500

## 2016-05-08 MED ORDER — MIDAZOLAM HCL 2 MG/2ML IJ SOLN
INTRAMUSCULAR | Status: AC
  Filled 2016-05-08: qty 2

## 2016-05-08 SURGICAL SUPPLY — 14 items
CATH 5FR JL4 DIAGNOSTIC (CATHETERS) ×3 IMPLANT
CATH 5FR JR4 DIAGNOSTIC (CATHETERS) ×2 IMPLANT
CATH INFINITI 5FR ANG PIGTAIL (CATHETERS) ×3 IMPLANT
CATH SWANZ 7F THERMO (CATHETERS) ×3 IMPLANT
DEVICE CLOSURE MYNXGRIP 5F (Vascular Products) ×3 IMPLANT
GUIDEWIRE 3MM J TIP .035 145 (WIRE) ×3 IMPLANT
GUIDEWIRE EMER 3M J .025X150CM (WIRE) ×3 IMPLANT
GUIDEWIRE J TIP .035 (WIRE) IMPLANT
KIT MANI 3VAL PERCEP (MISCELLANEOUS) ×3 IMPLANT
KIT RIGHT HEART (MISCELLANEOUS) ×3 IMPLANT
NEEDLE PERC 18GX7CM (NEEDLE) ×3 IMPLANT
PACK CARDIAC CATH (CUSTOM PROCEDURE TRAY) ×3 IMPLANT
SHEATH AVANTI 5FR X 11CM (SHEATH) ×3 IMPLANT
SHEATH PINNACLE 7F 10CM (SHEATH) ×3 IMPLANT

## 2016-05-08 NOTE — Discharge Instructions (Signed)

## 2016-05-16 ENCOUNTER — Encounter: Payer: Self-pay | Admitting: Anesthesiology

## 2016-05-16 ENCOUNTER — Ambulatory Visit
Admission: RE | Admit: 2016-05-16 | Discharge: 2016-05-16 | Disposition: A | Discharge status: Home / Self Care | Payer: Medicare Other | Source: Ambulatory Visit | Attending: Cardiology | Admitting: Cardiology

## 2016-05-16 ENCOUNTER — Encounter
Admission: RE | Disposition: A | Discharge status: Home / Self Care | Payer: Self-pay | Source: Ambulatory Visit | Attending: Cardiology

## 2016-05-16 DIAGNOSIS — I252 Old myocardial infarction: Secondary | ICD-10-CM | POA: Insufficient documentation

## 2016-05-16 DIAGNOSIS — Z853 Personal history of malignant neoplasm of breast: Secondary | ICD-10-CM | POA: Diagnosis not present

## 2016-05-16 DIAGNOSIS — Z79899 Other long term (current) drug therapy: Secondary | ICD-10-CM | POA: Diagnosis not present

## 2016-05-16 DIAGNOSIS — K219 Gastro-esophageal reflux disease without esophagitis: Secondary | ICD-10-CM | POA: Insufficient documentation

## 2016-05-16 DIAGNOSIS — I495 Sick sinus syndrome: Secondary | ICD-10-CM | POA: Insufficient documentation

## 2016-05-16 DIAGNOSIS — Z7901 Long term (current) use of anticoagulants: Secondary | ICD-10-CM | POA: Diagnosis not present

## 2016-05-16 DIAGNOSIS — I481 Persistent atrial fibrillation: Secondary | ICD-10-CM | POA: Insufficient documentation

## 2016-05-16 DIAGNOSIS — I10 Essential (primary) hypertension: Secondary | ICD-10-CM | POA: Diagnosis not present

## 2016-05-16 DIAGNOSIS — I081 Rheumatic disorders of both mitral and tricuspid valves: Secondary | ICD-10-CM | POA: Insufficient documentation

## 2016-05-16 DIAGNOSIS — Z791 Long term (current) use of non-steroidal anti-inflammatories (NSAID): Secondary | ICD-10-CM | POA: Diagnosis not present

## 2016-05-16 DIAGNOSIS — Z950 Presence of cardiac pacemaker: Secondary | ICD-10-CM | POA: Diagnosis not present

## 2016-05-16 DIAGNOSIS — I480 Paroxysmal atrial fibrillation: Secondary | ICD-10-CM | POA: Diagnosis not present

## 2016-05-16 HISTORY — PX: TEE WITHOUT CARDIOVERSION: SHX5443

## 2016-05-16 SURGERY — ECHOCARDIOGRAM, TRANSESOPHAGEAL
Anesthesia: Moderate Sedation

## 2016-05-16 MED ORDER — BUTAMBEN-TETRACAINE-BENZOCAINE 2-2-14 % EX AERO
INHALATION_SPRAY | CUTANEOUS | Status: AC
  Filled 2016-05-16: qty 20

## 2016-05-16 MED ORDER — MIDAZOLAM HCL 5 MG/5ML IJ SOLN
INTRAMUSCULAR | Status: AC
  Filled 2016-05-16: qty 5

## 2016-05-16 MED ORDER — FENTANYL CITRATE (PF) 100 MCG/2ML IJ SOLN
INTRAMUSCULAR | Status: AC | PRN
  Administered 2016-05-16: 50 ug via INTRAVENOUS

## 2016-05-16 MED ORDER — FENTANYL CITRATE (PF) 100 MCG/2ML IJ SOLN
INTRAMUSCULAR | Status: AC
  Filled 2016-05-16: qty 2

## 2016-05-16 MED ORDER — LIDOCAINE VISCOUS 2 % MT SOLN
OROMUCOSAL | Status: AC
  Filled 2016-05-16: qty 15

## 2016-05-16 MED ORDER — MIDAZOLAM HCL 2 MG/2ML IJ SOLN
INTRAMUSCULAR | Status: AC | PRN
Start: 2016-05-16 — End: 2016-05-16
  Administered 2016-05-16: 2 mg via INTRAVENOUS

## 2016-05-16 MED ORDER — SODIUM CHLORIDE FLUSH 0.9 % IV SOLN
INTRAVENOUS | Status: AC
  Filled 2016-05-16: qty 10

## 2016-05-16 MED ORDER — SODIUM CHLORIDE 0.9 % IV SOLN: INTRAVENOUS | Status: DC

## 2016-05-16 NOTE — Discharge Instructions (Signed)
Electrical Cardioversion, Care After °This sheet gives you information about how to care for yourself after your procedure. Your health care provider may also give you more specific instructions. If you have problems or questions, contact your health care provider. °What can I expect after the procedure? °After the procedure, it is common to have: °· Some redness on the skin where the shocks were given. °Follow these instructions at home: °· Do not drive for 24 hours if you were given a medicine to help you relax (sedative). °· Take over-the-counter and prescription medicines only as told by your health care provider. °· Ask your health care provider how to check your pulse. Check it often. °· Rest for 48 hours after the procedure or as told by your health care provider. °· Avoid or limit your caffeine use as told by your health care provider. °Contact a health care provider if: °· You feel like your heart is beating too quickly or your pulse is not regular. °· You have a serious muscle cramp that does not go away. °Get help right away if: °· You have discomfort in your chest. °· You are dizzy or you feel faint. °· You have trouble breathing or you are short of breath. °· Your speech is slurred. °· You have trouble moving an arm or leg on one side of your body. °· Your fingers or toes turn cold or blue. °This information is not intended to replace advice given to you by your health care provider. Make sure you discuss any questions you have with your health care provider. °Document Released: 04/01/2013 Document Revised: 01/13/2016 Document Reviewed: 12/16/2015 °Elsevier Interactive Patient Education © 2017 [employer] ° °

## 2016-05-16 NOTE — Progress Notes (Signed)
*  PRELIMINARY RESULTS* Echocardiogram Echocardiogram Transesophageal has been performed.  Charlotte Glover 05/16/2016, 8:21 AM

## 2016-05-16 NOTE — Procedures (Signed)
    TRANSESOPHAGEAL ECHOCARDIOGRAM   NAME:  Charlotte Glover   MRN: [id number] DOB:  01/15/1928   ADMIT DATE: 05/16/2016  INDICATIONS: Mitral valve disease PROCEDURE:   Informed consent was obtained prior to the procedure. The risks, benefits and alternatives for the procedure were discussed and the patient comprehended these risks.  Risks include, but are not limited to, cough, sore throat, vomiting, nausea, somnolence, esophageal and stomach trauma or perforation, bleeding, low blood pressure, aspiration, pneumonia, infection, trauma to the teeth and death.    After a procedural time-out, the patient was given 2 mg versed and 50 mcg fentanyl for moderate sedation.  The oropharynx was anesthetized 1 cc of topical 1% viscous lidocaine.  The transesophageal probe was inserted in the esophagus and stomach without difficulty and multiple views were obtained.    COMPLICATIONS:    There were no immediate complications.  FINDINGS:  LEFT VENTRICLE: EF = 55%. No regional wall motion abnormalities.  RIGHT VENTRICLE: Normal size and function.   LEFT ATRIUM: Mildly enlarged with no thrombus.  LEFT ATRIAL APPENDAGE: No thrombus.   RIGHT ATRIUM:  Upper limits of normal. Pacer wire visualized  AORTIC VALVE:  Trileaflet. Mild to moderate AI  MITRAL VALVE:    Moderate to severe MR with two jets. No vegetations.   TRICUSPID VALVE: Normal. Mild tr  PULMONIC VALVE: Grossly normal.  INTERATRIAL SEPTUM: No PFO or ASD. Agitated saline contrast injected with no right to left shunt  PERICARDIUM: No effusion  DESCENDING AORTA: No significant abnormalities.    CONCLUSION: No vegetations.  Moderate to severe MR

## 2016-05-29 DIAGNOSIS — I340 Nonrheumatic mitral (valve) insufficiency: Secondary | ICD-10-CM | POA: Insufficient documentation

## 2016-06-20 ENCOUNTER — Encounter: Payer: Self-pay | Admitting: Anesthesiology

## 2016-06-20 ENCOUNTER — Ambulatory Visit: Payer: Medicare Other | Attending: Anesthesiology | Admitting: Anesthesiology

## 2016-06-20 VITALS — BP 139/49 | HR 71 | Temp 96.1°F | Resp 18 | Ht 69.0 in | Wt 173.0 lb

## 2016-06-20 DIAGNOSIS — M545 Low back pain: Secondary | ICD-10-CM | POA: Diagnosis present

## 2016-06-20 DIAGNOSIS — Z7901 Long term (current) use of anticoagulants: Secondary | ICD-10-CM | POA: Insufficient documentation

## 2016-06-20 DIAGNOSIS — M48062 Spinal stenosis, lumbar region with neurogenic claudication: Secondary | ICD-10-CM | POA: Insufficient documentation

## 2016-06-20 DIAGNOSIS — G8929 Other chronic pain: Secondary | ICD-10-CM | POA: Diagnosis not present

## 2016-06-20 DIAGNOSIS — Z79899 Other long term (current) drug therapy: Secondary | ICD-10-CM | POA: Diagnosis not present

## 2016-06-20 DIAGNOSIS — I4891 Unspecified atrial fibrillation: Secondary | ICD-10-CM | POA: Diagnosis not present

## 2016-06-20 DIAGNOSIS — Z5181 Encounter for therapeutic drug level monitoring: Secondary | ICD-10-CM | POA: Diagnosis not present

## 2016-06-20 DIAGNOSIS — K219 Gastro-esophageal reflux disease without esophagitis: Secondary | ICD-10-CM | POA: Insufficient documentation

## 2016-06-20 DIAGNOSIS — I2510 Atherosclerotic heart disease of native coronary artery without angina pectoris: Secondary | ICD-10-CM | POA: Insufficient documentation

## 2016-06-20 DIAGNOSIS — H9190 Unspecified hearing loss, unspecified ear: Secondary | ICD-10-CM | POA: Diagnosis not present

## 2016-06-20 DIAGNOSIS — Z8674 Personal history of sudden cardiac arrest: Secondary | ICD-10-CM | POA: Insufficient documentation

## 2016-06-20 DIAGNOSIS — Z950 Presence of cardiac pacemaker: Secondary | ICD-10-CM | POA: Insufficient documentation

## 2016-06-20 DIAGNOSIS — I252 Old myocardial infarction: Secondary | ICD-10-CM | POA: Insufficient documentation

## 2016-06-20 NOTE — Patient Instructions (Signed)
Pain Management Discharge Instructions  General Discharge Instructions :  If you need to reach your doctor call: Monday-Friday 8:00 am - 4:00 pm at [phone] or toll free [phone].  After clinic hours [phone] to have operator reach doctor.  Bring all of your medication bottles to all your appointments in the pain clinic.  To cancel or reschedule your appointment with Pain Management please remember to call 24 hours in advance to avoid a fee.  Refer to the educational materials which you have been given on: General Risks, I had my Procedure. Discharge Instructions, Post Sedation.  Post Procedure Instructions:  The drugs you were given will stay in your system until tomorrow, so for the next 24 hours you should not drive, make any legal decisions or drink any alcoholic beverages.  You may eat anything you prefer, but it is better to start with liquids then soups and crackers, and gradually work up to solid foods.  Please notify your doctor immediately if you have any unusual bleeding, trouble breathing or pain that is not related to your normal pain.  Depending on the type of procedure that was done, some parts of your body may feel week and/or numb.  This usually clears up by tonight or the next day.  Walk with the use of an assistive device or accompanied by an adult for the 24 hours.  You may use ice on the affected area for the first 24 hours.  Put ice in a Ziploc bag and cover with a towel and place against area 15 minutes on 15 minutes off.  You may switch to heat after 24 hours.Trigger Point Injection Trigger points are areas where you have pain. A trigger point injection is a shot given in the trigger point to help relieve pain for a few days to a few months. Common places for trigger points include:  The neck.  The shoulders.  The upper back.  The lower back. A trigger point injection will not cure long-lasting (chronic) pain permanently. These injections do not  always work for every person, but for some people they can help to relieve pain for a few days to a few months. Tell a health care provider about:  Any allergies you have.  All medicines you are taking, including vitamins, herbs, eye drops, creams, and over-the-counter medicines.  Any problems you or family members have had with anesthetic medicines.  Any blood disorders you have.  Any surgeries you have had.  Any medical conditions you have. What are the risks? Generally, this is a safe procedure. However, problems may occur, including:  Infection.  Bleeding.  Allergic reaction to the injected medicine.  Irritation of the skin around the injection site. What happens before the procedure?  Ask your health care provider about changing or stopping your regular medicines. This is especially important if you are taking diabetes medicines or blood thinners. What happens during the procedure?  Your health care provider will feel for trigger points. A marker may be used to circle the area for the injection.  The skin over the trigger point will be washed with a germ-killing (antiseptic) solution.  A thin needle is used for the shot. You may feel pain or a twitching feeling when the needle enters the trigger point.  A numbing solution may be injected into the trigger point. Sometimes a medicine to keep down swelling, redness, and warmth (inflammation) is also injected.  Your health care provider may move the needle around the area where the trigger   point is located until the tightness and twitching goes away.  After the injection, your health care provider may put gentle pressure over the injection site.  The injection site will be covered with a bandage (dressing). The procedure may vary among health care providers and hospitals. What happens after the procedure?  The dressing can be taken off in a few hours or as told by your health care provider.  You may feel sore and stiff  for 1-2 days. This information is not intended to replace advice given to you by your health care provider. Make sure you discuss any questions you have with your health care provider. Document Released: 05/31/2011 Document Revised: 02/12/2016 Document Reviewed: 11/29/2014 Elsevier Interactive Patient Education  2017 [employer]  

## 2016-06-21 NOTE — Progress Notes (Signed)
Subjective:  Patient ID: Charlotte Glover, female    DOB: 09/17/1927  Age: 82 y.o. MRN: [id number]  CC: Back Pain (low and left)      PROCEDURE:None  HPI Charlotte Glover presents for a new patient evaluation. She has a long-standing history of low back pain. This has been present for greater than 4 years with no known inciting event. Her maximum VAS score is a 7 and occasionally she is pain-free but recently the pain has been worse area and it is not influenced by time of day and is aggravated by standing lateral rotation and bending. She has taken oxycodone that is given her relief but conservative therapy has failed to give her any significant improvement. She has tried TENS physical therapy modalities and medication management of a conservative nature without much relief. She reports no lower extremity weakness or problems with her bowel or bladder function. And she describes the pain is aching and cramping disabling primarily in the central lower back region. In the past she has had epidural steroids with improvement in her low back pain.  History Pretty has a past medical history of Atrial fibrillation ([place]); Cancer ([place]); Cardiac arrest ([place]); Chickenpox; Coronary artery disease; Dyspnea; Dysrhythmia; Edema; GERD (gastroesophageal reflux disease); History of MI (myocardial infarction); HOH (hard of hearing); Myocardial infarction; S/P ablation of atrial fibrillation; and Shingles.   She has a past surgical history that includes Cardiac catheterization; Rotator cuff repair (Left); Cholecystectomy; Pacemaker insertion; Tonsillectomy; Breast surgery; Dilation and curettage of uterus; uterine polyp; Atrial ablation surgery; Esophagogastroduodenoscopy; Cardiac catheterization (Bilateral, 05/08/2016); and TEE without cardioversion (N/A, 05/16/2016).   Her family history includes Cancer in her brother, daughter, sister, and son.She reports that she has never smoked. She has never used  smokeless tobacco. She reports that she drinks about 0.6 oz of alcohol per week . She reports that she does not use drugs.  No results found for this or any previous visit.  No results found for: TOXASSSELUR  Outpatient Medications Prior to Visit  Medication Sig Dispense Refill  . calcium carbonate (TUMS - DOSED IN MG ELEMENTAL CALCIUM) 500 MG chewable tablet Chew 1-2 tablets by mouth daily as needed for indigestion or heartburn.    . furosemide (LASIX) 40 MG tablet Take 40 mg by mouth 2 (two) times daily.   3  . gabapentin (NEURONTIN) 300 MG capsule Take 1,200-1,500 mg by mouth 2 (two) times daily. Take 1500mg  in the morning and 1200mg  at night    . Multiple Vitamin (MULTIVITAMIN) tablet Take 1 tablet by mouth daily.    . Multiple Vitamins-Minerals (PRESERVISION AREDS 2) CAPS Take 1 capsule by mouth 2 (two) times daily.    . nitroGLYCERIN (NITROSTAT) 0.4 MG SL tablet Place 0.4 mg under the tongue every 5 (five) minutes x 2 doses as needed for chest pain.     . nortriptyline (PAMELOR) 10 MG capsule Take 10 mg by mouth every evening.  3  . omeprazole (PRILOSEC) 20 MG capsule Take 20 mg by mouth 2 (two) times daily before a meal.    . Polyvinyl Alcohol-Povidone (REFRESH OP) Apply 1 drop to eye daily as needed (dry eyes).    . potassium chloride SA (K-DUR,KLOR-CON) 20 MEQ tablet Take 20 mEq by mouth once.    . Rivaroxaban (XARELTO) 20 MG TABS Take 20 mg by mouth 1 day or 1 dose. (Patient taking differently: Take 20 mg by mouth daily. ) 30 tablet 0  . senna (SENOKOT) 8.6 MG TABS tablet Take  1 tablet by mouth daily as needed for mild constipation.     No facility-administered medications prior to visit.    Lab Results  Component Value Date   WBC 7.9 05/28/2014   HGB 14.0 05/28/2014   HCT 43.0 05/28/2014   PLT 236 05/28/2014   GLUCOSE 108 (H) 05/28/2014   ALT 21 05/30/2013   AST 19 05/30/2013   NA 141 05/28/2014   K 4.3 05/28/2014   CL 103 05/28/2014   CREATININE 0.87 05/28/2014   BUN  15 05/28/2014   CO2 29 05/28/2014   TSH 1.04 05/30/2013   INR 1.4 05/28/2014    --------------------------------------------------------------------------------------------------------------------- No results found.     ---------------------------------------------------------------------------------------------------------------------- Past Medical History:  Diagnosis Date  . Atrial fibrillation ([place])   . Cancer ([place])   . Cardiac arrest ([place])   . Chickenpox   . Coronary artery disease   . Dyspnea   . Dysrhythmia   . Edema   . GERD (gastroesophageal reflux disease)   . History of MI (myocardial infarction)   . HOH (hard of hearing)   . Myocardial infarction   . S/P ablation of atrial fibrillation   . Shingles     Past Surgical History:  Procedure Laterality Date  . ATRIAL ABLATION SURGERY    . BREAST SURGERY    . CARDIAC CATHETERIZATION    . CARDIAC CATHETERIZATION Bilateral 05/08/2016   Procedure: Right/Left Heart Cath and Coronary Angiography;  Surgeon: Isaias Cowman, MD;  Location: [facility];  Service: Cardiovascular;  Laterality: Bilateral;  . CHOLECYSTECTOMY    . DILATION AND CURETTAGE OF UTERUS    . ESOPHAGOGASTRODUODENOSCOPY    . PACEMAKER INSERTION    . ROTATOR CUFF REPAIR Left   . TEE WITHOUT CARDIOVERSION N/A 05/16/2016   Procedure: TRANSESOPHAGEAL ECHOCARDIOGRAM (TEE);  Surgeon: Teodoro Spray, MD;  Location: [facility];  Service: Cardiovascular;  Laterality: N/A;  . TONSILLECTOMY    . uterine polyp      Family History  Problem Relation Age of Onset  . Cancer Sister   . Cancer Brother   . Cancer Daughter   . Cancer Son     Social History  Substance Use Topics  . Smoking status: Never Smoker  . Smokeless tobacco: Never Used  . Alcohol use 0.6 oz/week    1 Glasses of wine per week     Comment: daily    ---------------------------------------------------------------------------------------------------------------------- Social  History   Social History  . Marital status: Married    Spouse name: N/A  . Number of children: N/A  . Years of education: N/A   Social History Main Topics  . Smoking status: Never Smoker  . Smokeless tobacco: Never Used  . Alcohol use 0.6 oz/week    1 Glasses of wine per week     Comment: daily  . Drug use: No  . Sexual activity: Not Asked   Other Topics Concern  . None   Social History Narrative  . None    Scheduled Meds: Continuous Infusions: PRN Meds:.   BP (!) 139/49   Pulse 71   Temp (!) 96.1 F (35.6 C) (Oral)   Resp 18   Ht 5\' 9"  (1.753 m)   Wt 173 lb (78.5 kg)   SpO2 100%   BMI 25.55 kg/m    BP Readings from Last 3 Encounters:  06/20/16 (!) 139/49  05/16/16 (!) 152/71  05/08/16 (!) 144/70     Wt Readings from Last 3 Encounters:  06/20/16 173 lb (78.5 kg)  05/16/16 173 lb (78.5 kg)  05/08/16 173 lb (78.5 kg)     ----------------------------------------------------------------------------------------------------------------------  ROS Review of Systems cardiac: No angina or shortness of breath she does have a pacemaker following her heart surgery with a history of mitral valve regurg. Pulmonary: No dyspnea on exertion or PND GI: No reflux at present no constipation  Neurologic: As above   Objective:  BP (!) 139/49   Pulse 71   Temp (!) 96.1 F (35.6 C) (Oral)   Resp 18   Ht 5\' 9"  (1.753 m)   Wt 173 lb (78.5 kg)   SpO2 100%   BMI 25.55 kg/m   Physical Exam Patient is a good historian hard of hearing. Pleasant alert and oriented and cooperative Heart is regular rate and rhythm with distant 2/6 systolic ejection murmur Lungs are clear to auscultation bilaterally Inspection of the low back reveals some paraspinous muscle tenderness with 1 overt trigger point on the left lower back. She has a negative straight leg raise bilaterally with good muscle strength rated at 5 over 5 both proximal and distal and intact light touch  sensation.     Assessment & Plan:   Sharonda was seen today for back pain.  Diagnoses and all orders for this visit:  Spinal stenosis of lumbar region with neurogenic claudication -     ToxASSURE Select 13 (MW), Urine  Chronic left-sided low back pain without sciatica -     TRIGGER POINT INJECTION; Future -     ToxASSURE Select 13 (MW), Urine     ----------------------------------------------------------------------------------------------------------------------  Problem List Items Addressed This Visit    None    Visit Diagnoses    Spinal stenosis of lumbar region with neurogenic claudication    -  Primary   Relevant Orders   ToxASSURE Select 13 (MW), Urine   Chronic left-sided low back pain without sciatica       Relevant Orders   TRIGGER POINT INJECTION   ToxASSURE Select 13 (MW), Urine      ----------------------------------------------------------------------------------------------------------------------  1. Spinal stenosis of lumbar region with neurogenic claudication We will have her return to clinic in 1-2 weeks for trigger point injection for an L5 left side trigger point. In the meantime I want her to continue with her stretching and strengthening exercises she is on blood thinners which we would need to hold prior to proceeding with an epidural steroid. She's had these in the past for her spinal stenosis and they have given her good relief. - ToxASSURE Select 13 (MW), Urine  2. Chronic left-sided low back pain without sciatica We'll have her continue her current medication management with the urine drug screen today and which point we may initiate her existing opioid therapy. - TRIGGER POINT INJECTION; Future - ToxASSURE Select 13 (MW), Urine    ----------------------------------------------------------------------------------------------------------------------  I am having Ms. Lajeunesse maintain her multivitamin, rivaroxaban, omeprazole, gabapentin,  PRESERVISION AREDS 2, nortriptyline, furosemide, nitroGLYCERIN, calcium carbonate, Polyvinyl Alcohol-Povidone (REFRESH OP), senna, and potassium chloride SA.   No orders of the defined types were placed in this encounter.      Follow-up: Return in about 2 weeks (around 07/04/2016) for evaluation, procedure.    Molli Barrows, MD  The [facility] practitioner database for opioid medications on this patient has been reviewed by me and my staff   Greater than 50% of the total encounter time was spent in counseling and / or coordination of care.     This dictation was performed utilizing Systems analyst.  Please  excuse any unintentional or mistaken typographical errors as a result.

## 2016-06-28 LAB — TOXASSURE SELECT 13 (MW), URINE

## 2016-06-30 DIAGNOSIS — I442 Atrioventricular block, complete: Secondary | ICD-10-CM | POA: Insufficient documentation

## 2016-07-02 ENCOUNTER — Encounter: Payer: Self-pay | Admitting: Anesthesiology

## 2016-07-02 ENCOUNTER — Ambulatory Visit: Payer: Medicare Other | Attending: Anesthesiology | Admitting: Anesthesiology

## 2016-07-02 VITALS — BP 156/72 | HR 72 | Resp 18 | Ht 69.0 in | Wt 175.0 lb

## 2016-07-02 DIAGNOSIS — M48062 Spinal stenosis, lumbar region with neurogenic claudication: Secondary | ICD-10-CM | POA: Diagnosis not present

## 2016-07-02 DIAGNOSIS — Z79899 Other long term (current) drug therapy: Secondary | ICD-10-CM | POA: Diagnosis not present

## 2016-07-02 DIAGNOSIS — Z7901 Long term (current) use of anticoagulants: Secondary | ICD-10-CM | POA: Insufficient documentation

## 2016-07-02 DIAGNOSIS — Z950 Presence of cardiac pacemaker: Secondary | ICD-10-CM | POA: Insufficient documentation

## 2016-07-02 DIAGNOSIS — G8929 Other chronic pain: Secondary | ICD-10-CM | POA: Diagnosis not present

## 2016-07-02 DIAGNOSIS — M545 Low back pain, unspecified: Secondary | ICD-10-CM

## 2016-07-02 MED ORDER — ROPIVACAINE HCL 2 MG/ML IJ SOLN
INTRAMUSCULAR | Status: AC
  Filled 2016-07-02: qty 10

## 2016-07-02 MED ORDER — DEXAMETHASONE SODIUM PHOSPHATE 4 MG/ML IJ SOLN
INTRAMUSCULAR | Status: AC
  Filled 2016-07-02: qty 2

## 2016-07-02 NOTE — Progress Notes (Signed)
Safety precautions to be maintained throughout the outpatient stay will include: orient to surroundings, keep bed in low position, maintain call bell within reach at all times, provide assistance with transfer out of bed and ambulation.  

## 2016-07-02 NOTE — Patient Instructions (Signed)
Trigger Point Injection Trigger points are areas where you have pain. A trigger point injection is a shot given in the trigger point to help relieve pain for a few days to a few months. Common places for trigger points include:  The neck.  The shoulders.  The upper back.  The lower back. A trigger point injection will not cure long-lasting (chronic) pain permanently. These injections do not always work for every person, but for some people they can help to relieve pain for a few days to a few months. Tell a health care provider about:  Any allergies you have.  All medicines you are taking, including vitamins, herbs, eye drops, creams, and over-the-counter medicines.  Any problems you or family members have had with anesthetic medicines.  Any blood disorders you have.  Any surgeries you have had.  Any medical conditions you have. What are the risks? Generally, this is a safe procedure. However, problems may occur, including:  Infection.  Bleeding.  Allergic reaction to the injected medicine.  Irritation of the skin around the injection site. What happens before the procedure?  Ask your health care provider about changing or stopping your regular medicines. This is especially important if you are taking diabetes medicines or blood thinners. What happens during the procedure?  Your health care provider will feel for trigger points. A marker may be used to circle the area for the injection.  The skin over the trigger point will be washed with a germ-killing (antiseptic) solution.  A thin needle is used for the shot. You may feel pain or a twitching feeling when the needle enters the trigger point.  A numbing solution may be injected into the trigger point. Sometimes a medicine to keep down swelling, redness, and warmth (inflammation) is also injected.  Your health care provider may move the needle around the area where the trigger point is located until the tightness and  twitching goes away.  After the injection, your health care provider may put gentle pressure over the injection site.  The injection site will be covered with a bandage (dressing). The procedure may vary among health care providers and hospitals. What happens after the procedure?  The dressing can be taken off in a few hours or as told by your health care provider.  You may feel sore and stiff for 1-2 days. This information is not intended to replace advice given to you by your health care provider. Make sure you discuss any questions you have with your health care provider. Document Released: 05/31/2011 Document Revised: 02/12/2016 Document Reviewed: 11/29/2014 Elsevier Interactive Patient Education  2017 [employer]. Pain Management Discharge Instructions  General Discharge Instructions :  If you need to reach your doctor call: Monday-Friday 8:00 am - 4:00 pm at [phone] or toll free [phone].  After clinic hours [phone] to have operator reach doctor.  Bring all of your medication bottles to all your appointments in the pain clinic.  To cancel or reschedule your appointment with Pain Management please remember to call 24 hours in advance to avoid a fee.  Refer to the educational materials which you have been given on: General Risks, I had my Procedure. Discharge Instructions, Post Sedation.  Post Procedure Instructions:  The drugs you were given will stay in your system until tomorrow, so for the next 24 hours you should not drive, make any legal decisions or drink any alcoholic beverages.  You may eat anything you prefer, but it is better to start with liquids then  soups and crackers, and gradually work up to solid foods.  Please notify your doctor immediately if you have any unusual bleeding, trouble breathing or pain that is not related to your normal pain.  Depending on the type of procedure that was done, some parts of your body may feel week and/or numb.   This usually clears up by tonight or the next day.  Walk with the use of an assistive device or accompanied by an adult for the 24 hours.  You may use ice on the affected area for the first 24 hours.  Put ice in a Ziploc bag and cover with a towel and place against area 15 minutes on 15 minutes off.  You may switch to heat after 24 hours.

## 2016-07-03 ENCOUNTER — Telehealth: Payer: Self-pay | Admitting: *Deleted

## 2016-07-03 NOTE — Progress Notes (Addendum)
Subjective:  Patient ID: Charlotte Glover, female    DOB [birth year]  Age: 83 y.o. MRN: [id number]  CC: Back Pain (lower, left)   Service Provided on Last Visit: Evaluation (new patient visit)  PROCEDURE : Left L5 trigger point injection  HPI Charlotte Glover presents for reevaluation. The quality characteristic and distribution of her back pain and generalized pain are otherwise unchanged. She continues to have severe pain in the left lower back of the same quality mentioned at her preceding initial patient evaluation. She desires to proceed with a trigger point injection for that today. No changes in her medication are reported and no changes in lower extremity strength or function are reported.   By history. She has a long-standing history of low back pain. This has been present for greater than 4 years with no known inciting event. Her maximum VAS score is a 7 and occasionally she is pain-free but recently the pain has been worse.  it is not influenced by time of day and is aggravated by standing lateral rotation and bending. She has taken oxycodone that is given her relief but conservative therapy has failed to give her any significant improvement. She has tried TENS physical therapy modalities and medication management of a conservative nature without much relief. She reports no lower extremity weakness or problems with her bowel or bladder function. And she describes the pain is aching and cramping disabling primarily in the central lower back region. In the past she has had epidural steroids with improvement in her low back pain.  History Charlotte Glover has a past medical history of Atrial fibrillation ([place]); Cancer ([place]); Cardiac arrest ([place]); Chickenpox; Coronary artery disease; Dyspnea; Dysrhythmia; Edema; GERD (gastroesophageal reflux disease); History of MI (myocardial infarction); HOH (hard of hearing); Myocardial infarction; S/P ablation of atrial fibrillation; and Shingles.    She has a past surgical history that includes Cardiac catheterization; Rotator cuff repair (Left); Cholecystectomy; Pacemaker insertion; Tonsillectomy; Breast surgery; Dilation and curettage of uterus; uterine polyp; Atrial ablation surgery; Esophagogastroduodenoscopy; Cardiac catheterization (Bilateral, 05/08/2016); and TEE without cardioversion (N/A, 05/16/2016).   Her family history includes Cancer in her brother, daughter, sister, and son.She reports that she has never smoked. She has never used smokeless tobacco. She reports that she drinks about 0.6 oz of alcohol per week . She reports that she does not use drugs.  No results found for this or any previous visit.  [employer] Select 13  Date Value Ref Range Status  06/20/2016 FINAL  Final    Comment:    ==================================================================== [employer] SELECT 13 (MW) ==================================================================== Test                             Result       Flag       Units   NO DRUGS DETECTED. ==================================================================== Test                      Result    Flag   Units      Ref Range   Creatinine              20               mg/dL      >=20 ==================================================================== Declared Medications:  The flagging and interpretation on this report are based on the  following declared medications.  Unexpected results may arise from  inaccuracies in the declared medications.  **Note:  The testing scope of this panel does not include following  reported medications:  Calcium Carbonate (Calcium Carb)  Docusate (Senokot-S)  Furosemide  Gabapentin (Neurontin)  Multivitamin  Nitroglycerin (Nitrostat)  Nortriptyline (Pamelor)  Omeprazole (Prilosec)  Potassium (K-Dur)  Rivaroxaban (Xarelto)  Sennosides (Senokot-S)  Supplement  (Preservision) ==================================================================== For clinical consultation, please call [phone]. ====================================================================     Outpatient Medications Prior to Visit  Medication Sig Dispense Refill  . calcium carbonate (TUMS - DOSED IN MG ELEMENTAL CALCIUM) 500 MG chewable tablet Chew 1-2 tablets by mouth daily as needed for indigestion or heartburn.    . furosemide (LASIX) 40 MG tablet Take 40 mg by mouth 2 (two) times daily.   3  . gabapentin (NEURONTIN) 300 MG capsule Take 1,200-1,500 mg by mouth 2 (two) times daily. Take 1500mg  in the morning and 1200mg  at night    . Multiple Vitamin (MULTIVITAMIN) tablet Take 1 tablet by mouth daily.    . Multiple Vitamins-Minerals (PRESERVISION AREDS 2) CAPS Take 1 capsule by mouth 2 (two) times daily.    . nitroGLYCERIN (NITROSTAT) 0.4 MG SL tablet Place 0.4 mg under the tongue every 5 (five) minutes x 2 doses as needed for chest pain.     . nortriptyline (PAMELOR) 10 MG capsule Take 10 mg by mouth every evening.  3  . omeprazole (PRILOSEC) 20 MG capsule Take 20 mg by mouth 2 (two) times daily before a meal.    . Polyvinyl Alcohol-Povidone (REFRESH OP) Apply 1 drop to eye daily as needed (dry eyes).    . potassium chloride SA (K-DUR,KLOR-CON) 20 MEQ tablet Take 20 mEq by mouth once.    . Rivaroxaban (XARELTO) 20 MG TABS Take 20 mg by mouth 1 day or 1 dose. (Patient taking differently: Take 20 mg by mouth daily. ) 30 tablet 0  . senna (SENOKOT) 8.6 MG TABS tablet Take 1 tablet by mouth daily as needed for mild constipation.     No facility-administered medications prior to visit.    Lab Results  Component Value Date   WBC 7.9 05/28/2014   HGB 14.0 05/28/2014   HCT 43.0 05/28/2014   PLT 236 05/28/2014   GLUCOSE 108 (H) 05/28/2014   ALT 21 05/30/2013   AST 19 05/30/2013   NA 141 05/28/2014   K 4.3 05/28/2014   CL 103 05/28/2014   CREATININE 0.87 05/28/2014    BUN 15 05/28/2014   CO2 29 05/28/2014   TSH 1.04 05/30/2013   INR 1.4 05/28/2014    --------------------------------------------------------------------------------------------------------------------- No results found.     ---------------------------------------------------------------------------------------------------------------------- Past Medical History:  Diagnosis Date  . Atrial fibrillation ([place])   . Cancer ([place])   . Cardiac arrest ([place])   . Chickenpox   . Coronary artery disease   . Dyspnea   . Dysrhythmia   . Edema   . GERD (gastroesophageal reflux disease)   . History of MI (myocardial infarction)   . HOH (hard of hearing)   . Myocardial infarction   . S/P ablation of atrial fibrillation   . Shingles     Past Surgical History:  Procedure Laterality Date  . ATRIAL ABLATION SURGERY    . BREAST SURGERY    . CARDIAC CATHETERIZATION    . CARDIAC CATHETERIZATION Bilateral 05/08/2016   Procedure: Right/Left Heart Cath and Coronary Angiography;  Surgeon: Isaias Cowman, MD;  Location: [facility];  Service: Cardiovascular;  Laterality: Bilateral;  . CHOLECYSTECTOMY    . DILATION AND CURETTAGE OF UTERUS    .  ESOPHAGOGASTRODUODENOSCOPY    . PACEMAKER INSERTION    . ROTATOR CUFF REPAIR Left   . TEE WITHOUT CARDIOVERSION N/A 05/16/2016   Procedure: TRANSESOPHAGEAL ECHOCARDIOGRAM (TEE);  Surgeon: Teodoro Spray, MD;  Location: [facility];  Service: Cardiovascular;  Laterality: N/A;  . TONSILLECTOMY    . uterine polyp      Family History  Problem Relation Age of Onset  . Cancer Sister   . Cancer Brother   . Cancer Daughter   . Cancer Son     Social History  Substance Use Topics  . Smoking status: Never Smoker  . Smokeless tobacco: Never Used  . Alcohol use 0.6 oz/week    1 Glasses of wine per week     Comment: daily     ---------------------------------------------------------------------------------------------------------------------- Social History   Social History  . Marital status: Married    Spouse name: N/A  . Number of children: N/A  . Years of education: N/A   Social History Main Topics  . Smoking status: Never Smoker  . Smokeless tobacco: Never Used  . Alcohol use 0.6 oz/week    1 Glasses of wine per week     Comment: daily  . Drug use: No  . Sexual activity: Not Asked   Other Topics Concern  . None   Social History Narrative  . None    Scheduled Meds: Continuous Infusions: PRN Meds:.   BP (!) 156/72   Pulse 72   Resp 18   Ht 5\' 9"  (1.753 m)   Wt 175 lb (79.4 kg)   SpO2 100%   BMI 25.84 kg/m    BP Readings from Last 3 Encounters:  07/02/16 (!) 156/72  06/20/16 (!) 139/49  05/16/16 (!) 152/71     Wt Readings from Last 3 Encounters:  07/02/16 175 lb (79.4 kg)  06/20/16 173 lb (78.5 kg)  05/16/16 173 lb (78.5 kg)     ----------------------------------------------------------------------------------------------------------------------  ROS Review of Systems cardiac: No angina or shortness of breath she does have a pacemaker following her heart surgery with a history of mitral valve regurg. Pulmonary: No dyspnea on exertion or PND GI: No reflux at present no constipation  Neurologic: As above   Objective:  BP (!) 156/72   Pulse 72   Resp 18   Ht 5\' 9"  (1.753 m)   Wt 175 lb (79.4 kg)   SpO2 100%   BMI 25.84 kg/m   Physical Exam Patient is a good historian hard of hearing. Pleasant alert and oriented and cooperative Heart is regular rate and rhythm with distant 2/6 systolic ejection murmur Lungs are clear to auscultation bilaterally Inspection of the low back reveals some paraspinous muscle tenderness with 1 overt trigger point on the left lower back.    Assessment & Plan:   Charlotte Glover was seen today for back pain.  Diagnoses and all orders  for this visit:  Spinal stenosis of lumbar region with neurogenic claudication  Chronic left-sided low back pain without sciatica -     TRIGGER POINT INJECTION  Acute left-sided low back pain without sciatica  Other orders -     ropivacaine (PF) 2 mg/mL (0.2%) (NAROPIN) 2 MG/ML injection;  -     dexamethasone (DECADRON) 4 MG/ML injection;      ----------------------------------------------------------------------------------------------------------------------  Problem List Items Addressed This Visit    None    Visit Diagnoses    Spinal stenosis of lumbar region with neurogenic claudication    -  Primary   Chronic left-sided low back pain without  sciatica       Acute left-sided low back pain without sciatica          ----------------------------------------------------------------------------------------------------------------------  1. Spinal stenosis of lumbar region with neurogenic claudication  2. Chronic left-sided low back pain without sciatica  3.  Myofascial low back pain. We'll plan on a trigger point injection at L5 left side today. The risks and benefits of an reviewed all questions answered. She is to return to clinic in 1 month for reevaluation.  ----------------------------------------------------------------------------------------------------------------------  I am having Ms. Pottenger maintain her multivitamin, rivaroxaban, omeprazole, gabapentin, PRESERVISION AREDS 2, nortriptyline, furosemide, nitroGLYCERIN, calcium carbonate, Polyvinyl Alcohol-Povidone (REFRESH OP), senna, and potassium chloride SA.   Meds ordered this encounter  Medications  . ropivacaine (PF) 2 mg/mL (0.2%) (NAROPIN) 2 MG/ML injection    Sharlett Iles, Delores: cabinet override  . dexamethasone (DECADRON) 4 MG/ML injection    Sharlett Iles, Delores: cabinet override       Follow-up: Return in about 1 month (around 08/02/2016) for evaluation, procedure.   Procedure note. Left L5 trigger  point injection  Trigger point injection: The area overlying the aforementioned trigger points were prepped with alcohol. They were then injected with a 25-gauge needle with 8 cc of ropivacaine 0.2% and Decadron 8 mg at each site after negative aspiration for heme. This was performed after informed consent was obtained and risks and benefits reviewed. She tolerated this procedure without difficulty and was convalesced and discharged to home in stable condition for follow-up as mentioned.  @Shawnee Gambone, MD@    Molli Barrows, MD  The [place] practitioner database for opioid medications on this patient has been reviewed by me and my staff   Greater than 50% of the total encounter time was spent in counseling and / or coordination of care.     This dictation was performed utilizing Systems analyst.  Please excuse any unintentional or mistaken typographical errors as a result.

## 2016-07-03 NOTE — Telephone Encounter (Signed)
No problems post procedure. 

## 2016-07-09 ENCOUNTER — Ambulatory Visit: Payer: BLUE CROSS/BLUE SHIELD | Admitting: Anesthesiology

## 2016-07-13 HISTORY — PX: BIV PACEMAKER INSERTION CRT-P: EP1199

## 2016-08-09 ENCOUNTER — Encounter: Payer: Self-pay | Admitting: Anesthesiology

## 2016-08-09 ENCOUNTER — Ambulatory Visit: Payer: Medicare Other | Attending: Anesthesiology | Admitting: Anesthesiology

## 2016-08-09 VITALS — BP 139/60 | HR 88 | Temp 97.7°F | Resp 16 | Ht 69.0 in | Wt 170.0 lb

## 2016-08-09 DIAGNOSIS — R0600 Dyspnea, unspecified: Secondary | ICD-10-CM | POA: Diagnosis not present

## 2016-08-09 DIAGNOSIS — Z950 Presence of cardiac pacemaker: Secondary | ICD-10-CM | POA: Insufficient documentation

## 2016-08-09 DIAGNOSIS — K219 Gastro-esophageal reflux disease without esophagitis: Secondary | ICD-10-CM | POA: Insufficient documentation

## 2016-08-09 DIAGNOSIS — I252 Old myocardial infarction: Secondary | ICD-10-CM | POA: Diagnosis not present

## 2016-08-09 DIAGNOSIS — M48062 Spinal stenosis, lumbar region with neurogenic claudication: Secondary | ICD-10-CM | POA: Insufficient documentation

## 2016-08-09 DIAGNOSIS — G8929 Other chronic pain: Secondary | ICD-10-CM | POA: Diagnosis not present

## 2016-08-09 DIAGNOSIS — M545 Low back pain, unspecified: Secondary | ICD-10-CM

## 2016-08-09 DIAGNOSIS — Z859 Personal history of malignant neoplasm, unspecified: Secondary | ICD-10-CM | POA: Insufficient documentation

## 2016-08-09 DIAGNOSIS — I2510 Atherosclerotic heart disease of native coronary artery without angina pectoris: Secondary | ICD-10-CM | POA: Diagnosis not present

## 2016-08-09 DIAGNOSIS — I4891 Unspecified atrial fibrillation: Secondary | ICD-10-CM | POA: Diagnosis not present

## 2016-08-09 DIAGNOSIS — M25561 Pain in right knee: Secondary | ICD-10-CM | POA: Insufficient documentation

## 2016-08-09 DIAGNOSIS — H9190 Unspecified hearing loss, unspecified ear: Secondary | ICD-10-CM | POA: Insufficient documentation

## 2016-08-09 DIAGNOSIS — Z79899 Other long term (current) drug therapy: Secondary | ICD-10-CM | POA: Insufficient documentation

## 2016-08-09 DIAGNOSIS — Z809 Family history of malignant neoplasm, unspecified: Secondary | ICD-10-CM | POA: Insufficient documentation

## 2016-08-09 DIAGNOSIS — Z7901 Long term (current) use of anticoagulants: Secondary | ICD-10-CM | POA: Diagnosis not present

## 2016-08-09 DIAGNOSIS — Z8674 Personal history of sudden cardiac arrest: Secondary | ICD-10-CM | POA: Insufficient documentation

## 2016-08-09 MED ORDER — DEXAMETHASONE SODIUM PHOSPHATE 4 MG/ML IJ SOLN
10.0000 mg | Freq: Once | INTRAMUSCULAR | Status: AC
  Administered 2016-08-13: 10 mg

## 2016-08-09 MED ORDER — ROPIVACAINE HCL 2 MG/ML IJ SOLN
1.0000 mL | Freq: Once | INTRAMUSCULAR | Status: AC
  Administered 2016-08-13: 1 mL via EPIDURAL

## 2016-08-09 MED ORDER — DEXAMETHASONE SODIUM PHOSPHATE 4 MG/ML IJ SOLN
INTRAMUSCULAR | Status: AC
  Filled 2016-08-09: qty 2

## 2016-08-09 MED ORDER — ROPIVACAINE HCL 2 MG/ML IJ SOLN
INTRAMUSCULAR | Status: AC
  Filled 2016-08-09: qty 20

## 2016-08-09 NOTE — Progress Notes (Signed)
Subjective:  Patient ID: Charlotte Glover, female    DOB 1927/12/19  Age: 83 y.o. MRN: [id number]  CC: Knee Pain (right)   Service Provided on Last Visit: Procedure  PROCEDURE : Left L5 trigger point injection#2  HPI Holliday Callaway presents for reevaluation last seen January 8. She had a left L5 trigger point injection at that time yielding 100% pain relief up until last week. She now has some intermittent pain of the same quality but much less frequent and no longer continuous in the left lower back. No changes to the lower extremities strength or function are noted.   By history. She has a long-standing history of low back pain. This has been present for greater than 4 years with no known inciting event. Her maximum VAS score is a 7 and occasionally she is pain-free but recently the pain has been worse.  it is not influenced by time of day and is aggravated by standing lateral rotation and bending. She has taken oxycodone that is given her relief but conservative therapy has failed to give her any significant improvement. She has tried TENS physical therapy modalities and medication management of a conservative nature without much relief. She reports no lower extremity weakness or problems with her bowel or bladder function. And she describes the pain is aching and cramping disabling primarily in the central lower back region. In the past she has had epidural steroids with improvement in her low back pain.  History Tranise has a past medical history of Atrial fibrillation ([place]); Cancer ([place]); Cardiac arrest ([facility] [place]); Chickenpox; Coronary artery disease; Dyspnea; Dysrhythmia; Edema; GERD (gastroesophageal reflux disease); History of MI (myocardial infarction); HOH (hard of hearing); Myocardial infarction; S/P ablation of atrial fibrillation; and Shingles.   She has a past surgical history that includes Cardiac catheterization; Rotator cuff repair (Left); Cholecystectomy; Pacemaker  insertion; Tonsillectomy; Breast surgery; Dilation and curettage of uterus; uterine polyp; Atrial ablation surgery; Esophagogastroduodenoscopy; Cardiac catheterization (Bilateral, 05/08/2016); and TEE without cardioversion (N/A, 05/16/2016).   Her family history includes Cancer in her brother, daughter, sister, and son.She reports that she has never smoked. She has never used smokeless tobacco. She reports that she drinks about 0.6 oz of alcohol per week . She reports that she does not use drugs.  No results found for this or any previous visit.  Tox[employer] Select 13  Date Value Ref Range Status  06/20/2016 FINAL  Final    Comment:    ==================================================================== TOX[employer] SELECT 13 (MW) ==================================================================== Test                             Result       Flag       Units   NO DRUGS DETECTED. ==================================================================== Test                      Result    Flag   Units      Ref Range   Creatinine              20               mg/dL      >=20 ==================================================================== Declared Medications:  The flagging and interpretation on this report are based on the  following declared medications.  Unexpected results may arise from  inaccuracies in the declared medications.  **Note: The testing scope of this panel does not include following  reported medications:  Calcium Carbonate (Calcium Carb)  Docusate (Senokot-S)  Furosemide  Gabapentin (Neurontin)  Multivitamin  Nitroglycerin (Nitrostat)  Nortriptyline (Pamelor)  Omeprazole (Prilosec)  Potassium (K-Dur)  Rivaroxaban (Xarelto)  Sennosides (Senokot-S)  Supplement (Preservision) ==================================================================== For clinical consultation, please call [phone]. ====================================================================      Outpatient Medications Prior to Visit  Medication Sig Dispense Refill  . calcium carbonate (TUMS - DOSED IN MG ELEMENTAL CALCIUM) 500 MG chewable tablet Chew 1-2 tablets by mouth daily as needed for indigestion or heartburn.    . furosemide (LASIX) 40 MG tablet Take 40 mg by mouth 2 (two) times daily.   3  . gabapentin (NEURONTIN) 300 MG capsule Take 1,200-1,500 mg by mouth 2 (two) times daily. Take 1500mg  in the morning and 1200mg  at night    . Multiple Vitamin (MULTIVITAMIN) tablet Take 1 tablet by mouth daily.    . nitroGLYCERIN (NITROSTAT) 0.4 MG SL tablet Place 0.4 mg under the tongue every 5 (five) minutes x 2 doses as needed for chest pain.     . nortriptyline (PAMELOR) 10 MG capsule Take 10 mg by mouth every evening.  3  . Polyvinyl Alcohol-Povidone (REFRESH OP) Apply 1 drop to eye daily as needed (dry eyes).    . potassium chloride SA (K-DUR,KLOR-CON) 20 MEQ tablet Take 20 mEq by mouth once.    . Rivaroxaban (XARELTO) 20 MG TABS Take 20 mg by mouth 1 day or 1 dose. (Patient taking differently: Take 20 mg by mouth daily. ) 30 tablet 0  . senna (SENOKOT) 8.6 MG TABS tablet Take 1 tablet by mouth daily as needed for mild constipation.    . Multiple Vitamins-Minerals (PRESERVISION AREDS 2) CAPS Take 1 capsule by mouth 2 (two) times daily.    Marland Kitchen omeprazole (PRILOSEC) 20 MG capsule Take 20 mg by mouth 2 (two) times daily before a meal.     No facility-administered medications prior to visit.    Lab Results  Component Value Date   WBC 7.9 05/28/2014   HGB 14.0 05/28/2014   HCT 43.0 05/28/2014   PLT 236 05/28/2014   GLUCOSE 108 (H) 05/28/2014   ALT 21 05/30/2013   AST 19 05/30/2013   NA 141 05/28/2014   K 4.3 05/28/2014   CL 103 05/28/2014   CREATININE 0.87 05/28/2014   BUN 15 05/28/2014   CO2 29 05/28/2014   TSH 1.04 05/30/2013   INR 1.4 05/28/2014    --------------------------------------------------------------------------------------------------------------------- No  results found.     ---------------------------------------------------------------------------------------------------------------------- Past Medical History:  Diagnosis Date  . Atrial fibrillation ([place])   . Cancer ([place])   . Cardiac arrest ([place])   . Chickenpox   . Coronary artery disease   . Dyspnea   . Dysrhythmia   . Edema   . GERD (gastroesophageal reflux disease)   . History of MI (myocardial infarction)   . HOH (hard of hearing)   . Myocardial infarction   . S/P ablation of atrial fibrillation   . Shingles     Past Surgical History:  Procedure Laterality Date  . ATRIAL ABLATION SURGERY    . BREAST SURGERY    . CARDIAC CATHETERIZATION    . CARDIAC CATHETERIZATION Bilateral 05/08/2016   Procedure: Right/Left Heart Cath and Coronary Angiography;  Surgeon: Isaias Cowman, MD;  Location: [facility];  Service: Cardiovascular;  Laterality: Bilateral;  . CHOLECYSTECTOMY    . DILATION AND CURETTAGE OF UTERUS    . ESOPHAGOGASTRODUODENOSCOPY    . PACEMAKER INSERTION    . ROTATOR CUFF REPAIR  Left   . TEE WITHOUT CARDIOVERSION N/A 05/16/2016   Procedure: TRANSESOPHAGEAL ECHOCARDIOGRAM (TEE);  Surgeon: Teodoro Spray, MD;  Location: [facility];  Service: Cardiovascular;  Laterality: N/A;  . TONSILLECTOMY    . uterine polyp      Family History  Problem Relation Age of Onset  . Cancer Sister   . Cancer Brother   . Cancer Daughter   . Cancer Son     Social History  Substance Use Topics  . Smoking status: Never Smoker  . Smokeless tobacco: Never Used  . Alcohol use 0.6 oz/week    1 Glasses of wine per week     Comment: daily    ---------------------------------------------------------------------------------------------------------------------- Social History   Social History  . Marital status: Married    Spouse name: N/A  . Number of children: N/A  . Years of education: N/A   Social History Main Topics  . Smoking status: Never Smoker  .  Smokeless tobacco: Never Used  . Alcohol use 0.6 oz/week    1 Glasses of wine per week     Comment: daily  . Drug use: No  . Sexual activity: Not Asked   Other Topics Concern  . None   Social History Narrative  . None    Scheduled Meds: Continuous Infusions: PRN Meds:.   BP 139/60   Pulse 88   Temp 97.7 F (36.5 C) (Oral)   Resp 16   Ht 5\' 9"  (1.753 m)   Wt 170 lb (77.1 kg)   SpO2 98%   BMI 25.10 kg/m    BP Readings from Last 3 Encounters:  08/09/16 139/60  07/02/16 (!) 156/72  06/20/16 (!) 139/49     Wt Readings from Last 3 Encounters:  08/09/16 170 lb (77.1 kg)  07/02/16 175 lb (79.4 kg)  06/20/16 173 lb (78.5 kg)     ----------------------------------------------------------------------------------------------------------------------  ROS Review of Systems cardiac: No angina or shortness of breath she does have a pacemaker following her heart surgery with a history of mitral valve regurg. Pulmonary: No dyspnea on exertion or PND GI: No reflux at present no constipation  Neurologic: As above   Objective:  BP 139/60   Pulse 88   Temp 97.7 F (36.5 C) (Oral)   Resp 16   Ht 5\' 9"  (1.753 m)   Wt 170 lb (77.1 kg)   SpO2 98%   BMI 25.10 kg/m   Physical Exam Patient is a good historian hard of hearing. Pleasant alert and oriented and cooperative Heart is regular rate and rhythm with distant 2/6 systolic ejection murmur Lungs are clear to auscultation bilaterally Inspection of the low back reveals some paraspinous muscle tenderness with 1 overt trigger point on the left lower back With diminished tenderness as compared to her January exam.   Assessment & Plan:   Genavee was seen today for knee pain.  Diagnoses and all orders for this visit:  Chronic left-sided low back pain without sciatica -     dexamethasone (DECADRON) injection 10 mg; 2.5 mLs (10 mg total) by Other route once. -     ropivacaine (PF) 2 mg/mL (0.2%) (NAROPIN) injection 1 mL;  1 mL by Epidural route once.  Other orders -     ropivacaine (PF) 2 mg/mL (0.2%) (NAROPIN) 2 MG/ML injection;  -     dexamethasone (DECADRON) 4 MG/ML injection;      ----------------------------------------------------------------------------------------------------------------------  Problem List Items Addressed This Visit    None    Visit Diagnoses    Chronic  left-sided low back pain without sciatica    -  Primary   Relevant Medications   Acetaminophen 500 MG coapsule   naproxen sodium (ANAPROX) 220 MG tablet   dexamethasone (DECADRON) 4 MG/ML injection   dexamethasone (DECADRON) injection 10 mg   ropivacaine (PF) 2 mg/mL (0.2%) (NAROPIN) injection 1 mL      ----------------------------------------------------------------------------------------------------------------------  1. Spinal stenosis of lumbar region with neurogenic claudication  2. Chronic left-sided low back pain without sciatica  3.  Myofascial low back pain. We'll plan on a trigger point injection at L5 left side today. The risks and benefits of an reviewed all questions answered. She is to return to clinic in 1 month for reevaluation.  ----------------------------------------------------------------------------------------------------------------------  I am having Ms. Sadowsky maintain her multivitamin, rivaroxaban, omeprazole, gabapentin, PRESERVISION AREDS 2, nortriptyline, furosemide, nitroGLYCERIN, calcium carbonate, Polyvinyl Alcohol-Povidone (REFRESH OP), senna, potassium chloride SA, Acetaminophen, gabapentin, nortriptyline, omeprazole, rivaroxaban, and naproxen sodium. We will continue to administer dexamethasone and ropivacaine (PF) 2 mg/mL (0.2%).   Meds ordered this encounter  Medications  . Acetaminophen 500 MG coapsule    Sig: Take by mouth.  . gabapentin (NEURONTIN) 300 MG capsule    Sig: Take by mouth.  . nortriptyline (PAMELOR) 50 MG capsule    Sig: Take by mouth.  Marland Kitchen omeprazole (PRILOSEC)  20 MG capsule    Sig: TAKE 1 CAPSULE(20 MG) BY MOUTH TWICE DAILY  . rivaroxaban (XARELTO) 20 MG TABS tablet    Sig: Take by mouth.  . naproxen sodium (ANAPROX) 220 MG tablet    Sig: Take 220 mg by mouth as needed.  . ropivacaine (PF) 2 mg/mL (0.2%) (NAROPIN) 2 MG/ML injection    Sharlett Iles, Delores: cabinet override  . dexamethasone (DECADRON) 4 MG/ML injection    Sharlett Iles, Delores: cabinet override  . dexamethasone (DECADRON) injection 10 mg  . ropivacaine (PF) 2 mg/mL (0.2%) (NAROPIN) injection 1 mL       Follow-up: Return in about 2 months (around 10/07/2016) for evaluation, procedure.   Procedure note. #2 Left L5 trigger point injection  Trigger point injection: The area overlying the aforementioned trigger points were prepped with alcohol. They were then injected with a 25-gauge needle with 8 cc of ropivacaine 0.2% and Decadron 8 mg at each site after negative aspiration for heme. This was performed after informed consent was obtained and risks and benefits reviewed. She tolerated this procedure without difficulty and was convalesced and discharged to home in stable condition for follow-up as mentioned.  @James  Adams, MD@    Molli Barrows, MD  The [facility] practitioner database for opioid medications on this patient has been reviewed by me and my staff   Greater than 50% of the total encounter time was spent in counseling and / or coordination of care.     This dictation was performed utilizing Systems analyst.  Please excuse any unintentional or mistaken typographical errors as a result.

## 2016-08-09 NOTE — Progress Notes (Signed)
Safety precautions to be maintained throughout the outpatient stay will include: orient to surroundings, keep bed in low position, maintain call bell within reach at all times, provide assistance with transfer out of bed and ambulation.  

## 2016-08-10 ENCOUNTER — Telehealth: Payer: Self-pay

## 2016-08-10 NOTE — Telephone Encounter (Signed)
Post procedure phone call.  Patient states she is doing fine, but the pain has came back.

## 2016-08-13 ENCOUNTER — Telehealth: Payer: Self-pay

## 2016-08-13 NOTE — Telephone Encounter (Signed)
Pt had a procedure Thursday. Pt has been in pain every since she had procedure. pt has been taking six  300mg  of gabapentin to help reduce pain. Patient would like for someone to call her back as as possible to discuss what she should do next

## 2016-08-13 NOTE — Telephone Encounter (Signed)
Denies any fever, loss of bowel or bladder control. Her only symptom is increased pain. Reminded that it may take 4-10 days to feel effects of steroids. Instructed to apply heat, rest. Call if any of the above symptoms appear. Reassurred that it is common to experience increased pain during first few days following a procedure.

## 2016-10-31 ENCOUNTER — Other Ambulatory Visit: Payer: Self-pay | Admitting: Infectious Diseases

## 2016-10-31 DIAGNOSIS — G8929 Other chronic pain: Secondary | ICD-10-CM

## 2016-10-31 DIAGNOSIS — M545 Low back pain: Principal | ICD-10-CM

## 2016-11-08 ENCOUNTER — Ambulatory Visit (HOSPITAL_COMMUNITY)
Admission: RE | Admit: 2016-11-08 | Discharge: 2016-11-08 | Disposition: A | Discharge status: Home / Self Care | Payer: Medicare Other | Source: Ambulatory Visit | Attending: Infectious Diseases | Admitting: Infectious Diseases

## 2016-11-08 DIAGNOSIS — M48061 Spinal stenosis, lumbar region without neurogenic claudication: Secondary | ICD-10-CM | POA: Diagnosis not present

## 2016-11-08 DIAGNOSIS — M4606 Spinal enthesopathy, lumbar region: Secondary | ICD-10-CM | POA: Insufficient documentation

## 2016-11-08 DIAGNOSIS — M47896 Other spondylosis, lumbar region: Secondary | ICD-10-CM | POA: Diagnosis not present

## 2016-11-08 DIAGNOSIS — G8929 Other chronic pain: Secondary | ICD-10-CM | POA: Insufficient documentation

## 2016-11-08 DIAGNOSIS — M545 Low back pain: Secondary | ICD-10-CM | POA: Insufficient documentation

## 2016-11-08 LAB — POCT I-STAT CREATININE: Creatinine, Ser: 0.4 mg/dL — ABNORMAL LOW (ref 0.44–1.00)

## 2016-11-08 LAB — POCT ACTIVATED CLOTTING TIME: ACTIVATED CLOTTING TIME: 230 s

## 2016-11-08 MED ORDER — GADOBENATE DIMEGLUMINE 529 MG/ML IV SOLN
15.0000 mL | Freq: Once | INTRAVENOUS | Status: AC | PRN
  Administered 2016-11-08: 15 mL via INTRAVENOUS

## 2016-12-07 DIAGNOSIS — M75101 Unspecified rotator cuff tear or rupture of right shoulder, not specified as traumatic: Secondary | ICD-10-CM | POA: Insufficient documentation

## 2016-12-07 DIAGNOSIS — M19011 Primary osteoarthritis, right shoulder: Secondary | ICD-10-CM | POA: Insufficient documentation

## 2016-12-07 DIAGNOSIS — M7581 Other shoulder lesions, right shoulder: Secondary | ICD-10-CM | POA: Insufficient documentation

## 2016-12-12 ENCOUNTER — Other Ambulatory Visit: Payer: Self-pay | Admitting: Surgery

## 2016-12-12 DIAGNOSIS — M25511 Pain in right shoulder: Secondary | ICD-10-CM

## 2016-12-12 DIAGNOSIS — M7581 Other shoulder lesions, right shoulder: Secondary | ICD-10-CM

## 2016-12-12 DIAGNOSIS — M75101 Unspecified rotator cuff tear or rupture of right shoulder, not specified as traumatic: Secondary | ICD-10-CM

## 2016-12-12 DIAGNOSIS — M19011 Primary osteoarthritis, right shoulder: Secondary | ICD-10-CM

## 2016-12-21 ENCOUNTER — Ambulatory Visit
Admission: RE | Admit: 2016-12-21 | Discharge: 2016-12-21 | Disposition: A | Discharge status: Home / Self Care | Payer: Medicare Other | Source: Ambulatory Visit | Attending: Surgery | Admitting: Surgery

## 2016-12-21 DIAGNOSIS — M25511 Pain in right shoulder: Secondary | ICD-10-CM

## 2016-12-21 DIAGNOSIS — M7581 Other shoulder lesions, right shoulder: Secondary | ICD-10-CM

## 2016-12-21 DIAGNOSIS — M75101 Unspecified rotator cuff tear or rupture of right shoulder, not specified as traumatic: Secondary | ICD-10-CM

## 2016-12-21 DIAGNOSIS — M19011 Primary osteoarthritis, right shoulder: Secondary | ICD-10-CM

## 2016-12-24 ENCOUNTER — Ambulatory Visit
Admission: RE | Admit: 2016-12-24 | Discharge: 2016-12-24 | Disposition: A | Discharge status: Home / Self Care | Payer: Medicare Other | Source: Ambulatory Visit | Attending: Surgery | Admitting: Surgery

## 2016-12-24 DIAGNOSIS — M75101 Unspecified rotator cuff tear or rupture of right shoulder, not specified as traumatic: Secondary | ICD-10-CM | POA: Insufficient documentation

## 2016-12-24 DIAGNOSIS — M25511 Pain in right shoulder: Secondary | ICD-10-CM | POA: Diagnosis present

## 2016-12-24 DIAGNOSIS — M19011 Primary osteoarthritis, right shoulder: Secondary | ICD-10-CM | POA: Diagnosis not present

## 2016-12-24 DIAGNOSIS — M7581 Other shoulder lesions, right shoulder: Secondary | ICD-10-CM | POA: Insufficient documentation

## 2016-12-24 MED ORDER — LIDOCAINE HCL (PF) 1 % IJ SOLN
10.0000 mL | Freq: Once | INTRAMUSCULAR | Status: AC
  Administered 2016-12-24: 10 mL
  Filled 2016-12-24: qty 10

## 2016-12-24 MED ORDER — IOPAMIDOL (ISOVUE-200) INJECTION 41%
10.0000 mL | Freq: Once | INTRAVENOUS | Status: AC | PRN
  Administered 2016-12-24: 10 mL
  Filled 2016-12-24: qty 50

## 2017-03-13 ENCOUNTER — Encounter: Payer: Self-pay | Admitting: *Deleted

## 2017-03-14 NOTE — Discharge Instructions (Signed)

## 2017-03-20 ENCOUNTER — Ambulatory Visit: Payer: Medicare Other | Admitting: Anesthesiology

## 2017-03-20 ENCOUNTER — Encounter: Payer: Self-pay | Admitting: *Deleted

## 2017-03-20 ENCOUNTER — Encounter
Admission: RE | Disposition: A | Discharge status: Home / Self Care | Payer: Self-pay | Source: Ambulatory Visit | Attending: Ophthalmology

## 2017-03-20 ENCOUNTER — Ambulatory Visit
Admission: RE | Admit: 2017-03-20 | Discharge: 2017-03-20 | Disposition: A | Discharge status: Home / Self Care | Payer: Medicare Other | Source: Ambulatory Visit | Attending: Ophthalmology | Admitting: Ophthalmology

## 2017-03-20 DIAGNOSIS — K219 Gastro-esophageal reflux disease without esophagitis: Secondary | ICD-10-CM | POA: Diagnosis not present

## 2017-03-20 DIAGNOSIS — T8522XA Displacement of intraocular lens, initial encounter: Secondary | ICD-10-CM | POA: Insufficient documentation

## 2017-03-20 DIAGNOSIS — I2510 Atherosclerotic heart disease of native coronary artery without angina pectoris: Secondary | ICD-10-CM | POA: Insufficient documentation

## 2017-03-20 DIAGNOSIS — I10 Essential (primary) hypertension: Secondary | ICD-10-CM | POA: Diagnosis not present

## 2017-03-20 DIAGNOSIS — Y838 Other surgical procedures as the cause of abnormal reaction of the patient, or of later complication, without mention of misadventure at the time of the procedure: Secondary | ICD-10-CM | POA: Diagnosis not present

## 2017-03-20 DIAGNOSIS — M1990 Unspecified osteoarthritis, unspecified site: Secondary | ICD-10-CM | POA: Insufficient documentation

## 2017-03-20 DIAGNOSIS — I252 Old myocardial infarction: Secondary | ICD-10-CM | POA: Insufficient documentation

## 2017-03-20 DIAGNOSIS — I4891 Unspecified atrial fibrillation: Secondary | ICD-10-CM | POA: Diagnosis not present

## 2017-03-20 DIAGNOSIS — Y9289 Other specified places as the place of occurrence of the external cause: Secondary | ICD-10-CM | POA: Insufficient documentation

## 2017-03-20 HISTORY — PX: ANTERIOR VITRECTOMY: SHX1173

## 2017-03-20 HISTORY — DX: Unspecified osteoarthritis, unspecified site: M19.90

## 2017-03-20 HISTORY — DX: Presence of external hearing-aid: Z97.4

## 2017-03-20 HISTORY — PX: PERIPHERAL IRIDOTOMY: SHX5414

## 2017-03-20 HISTORY — PX: CATARACT EXTRACTION W/PHACO: SHX586

## 2017-03-20 SURGERY — VITRECTOMY, ANTERIOR
Anesthesia: Monitor Anesthesia Care | Laterality: Left | Wound class: Clean

## 2017-03-20 MED ORDER — CEFUROXIME OPHTHALMIC INJECTION 1 MG/0.1 ML
INJECTION | OPHTHALMIC | Status: DC | PRN
  Administered 2017-03-20: 1 mg via INTRACAMERAL

## 2017-03-20 MED ORDER — FENTANYL CITRATE (PF) 100 MCG/2ML IJ SOLN
INTRAMUSCULAR | Status: DC | PRN
  Administered 2017-03-20: 50 ug via INTRAVENOUS

## 2017-03-20 MED ORDER — LIDOCAINE HCL 2 % IJ SOLN
INTRAMUSCULAR | Status: DC | PRN
  Administered 2017-03-20: 2.5 mL

## 2017-03-20 MED ORDER — PROPOFOL 10 MG/ML IV BOLUS
INTRAVENOUS | Status: DC | PRN
  Administered 2017-03-20: 30 mg via INTRAVENOUS

## 2017-03-20 MED ORDER — ACETAMINOPHEN 160 MG/5ML PO SOLN: 325.0000 mg | ORAL | Status: DC | PRN

## 2017-03-20 MED ORDER — EPINEPHRINE PF 1 MG/ML IJ SOLN
INTRAOCULAR | Status: DC | PRN
  Administered 2017-03-20: 56 mL via OPHTHALMIC

## 2017-03-20 MED ORDER — MOXIFLOXACIN HCL 0.5 % OP SOLN
1.0000 [drp] | Freq: Three times a day (TID) | OPHTHALMIC | Status: DC
  Administered 2017-03-20 (×3): 1 [drp] via OPHTHALMIC

## 2017-03-20 MED ORDER — MIDAZOLAM HCL 2 MG/2ML IJ SOLN
INTRAMUSCULAR | Status: DC | PRN
  Administered 2017-03-20: 2 mg via INTRAVENOUS

## 2017-03-20 MED ORDER — ACETAMINOPHEN 325 MG PO TABS: 325.0000 mg | ORAL_TABLET | ORAL | Status: DC | PRN

## 2017-03-20 MED ORDER — ONDANSETRON HCL 4 MG/2ML IJ SOLN: 4.0000 mg | Freq: Once | INTRAMUSCULAR | Status: DC | PRN

## 2017-03-20 MED ORDER — ARMC OPHTHALMIC DILATING DROPS
1.0000 "application " | OPHTHALMIC | Status: DC | PRN
  Administered 2017-03-20 (×3): 1 via OPHTHALMIC

## 2017-03-20 MED ORDER — NA HYALUR & NA CHOND-NA HYALUR 0.4-0.35 ML IO KIT
PACK | INTRAOCULAR | Status: DC | PRN
  Administered 2017-03-20: 1 mL via INTRAOCULAR

## 2017-03-20 MED ORDER — LACTATED RINGERS IV SOLN: 10.0000 mL/h | INTRAVENOUS | Status: DC

## 2017-03-20 MED ORDER — ACETYLCHOLINE CHLORIDE 20 MG IO SOLR
INTRAOCULAR | Status: DC | PRN
  Administered 2017-03-20: 20 mg via INTRAOCULAR

## 2017-03-20 SURGICAL SUPPLY — 26 items
CANNULA ANT/CHMB 27GA (MISCELLANEOUS) ×6 IMPLANT
CARTRIDGE ABBOTT (MISCELLANEOUS) IMPLANT
GLIDE IOL SHEET 35MLX5ML (MISCELLANEOUS) ×3 IMPLANT
GLOVE SURG LX 7.5 STRW (GLOVE) ×2
GLOVE SURG LX STRL 7.5 STRW (GLOVE) ×1 IMPLANT
GLOVE SURG TRIUMPH 8.0 PF LTX (GLOVE) ×3 IMPLANT
GOWN STRL REUS W/ TWL LRG LVL3 (GOWN DISPOSABLE) ×2 IMPLANT
GOWN STRL REUS W/TWL LRG LVL3 (GOWN DISPOSABLE) ×4
KNIFE 45D UP 2.3 (MISCELLANEOUS) ×3 IMPLANT
LENS IOL +20 DIOP CNVXPLN (Intraocular Lens) ×1 IMPLANT
LENS IOL KELMAN MF 20.0 (Intraocular Lens) ×1 IMPLANT
LENS IOL KELMAN MULTIFLEX 20.0 (Intraocular Lens) ×2 IMPLANT
MARKER SKIN DUAL TIP RULER LAB (MISCELLANEOUS) ×3 IMPLANT
NDL RETROBULBAR .5 NSTRL (NEEDLE) ×3 IMPLANT
NEEDLE FILTER BLUNT 18X 1/2SAF (NEEDLE) ×2
NEEDLE FILTER BLUNT 18X1 1/2 (NEEDLE) ×1 IMPLANT
PACK CATARACT BRASINGTON (MISCELLANEOUS) ×3 IMPLANT
PACK EYE AFTER SURG (MISCELLANEOUS) ×3 IMPLANT
PACK OPTHALMIC (MISCELLANEOUS) ×3 IMPLANT
SUT ETHILON 10-0 CS-B-6CS-B-6 (SUTURE) ×3
SUTURE EHLN 10-0 CS-B-6CS-B-6 (SUTURE) ×1 IMPLANT
SYR 3ML LL SCALE MARK (SYRINGE) ×6 IMPLANT
SYR 5ML LL (SYRINGE) ×3 IMPLANT
SYR TB 1ML LUER SLIP (SYRINGE) ×3 IMPLANT
WATER STERILE IRR 250ML POUR (IV SOLUTION) ×3 IMPLANT
WIPE NON LINTING 3.25X3.25 (MISCELLANEOUS) ×3 IMPLANT

## 2017-03-20 NOTE — H&P (Signed)
The History and Physical notes are on paper, have been signed, and are to be scanned. The patient remains stable and unchanged from the H&P.   Previous H&P reviewed, patient examined, and there are no changes.  Charlotte Glover 03/20/2017 10:31 AM'

## 2017-03-20 NOTE — Anesthesia Procedure Notes (Signed)
Procedure Name: MAC Date/Time: 03/20/2017 11:07 AM Performed by: Londell Moh Pre-anesthesia Checklist: Patient identified, Emergency Drugs available, Suction available and Patient being monitored Patient Re-evaluated:Patient Re-evaluated prior to induction Oxygen Delivery Method: Nasal cannula

## 2017-03-20 NOTE — Op Note (Signed)
LOCATION:  [place]  PREOPERATIVE DIAGNOSIS: Dislocated intraocular lens left eye.   POSTOPERATIVE DIAGNOSIS:   Dislocated intraocular lens left eye.    PROCEDURE:  1) Intraocular lens exchange left eye 2) Anterior vitrectomy left eye 3) Peripheral iridotomy left eye   LENS:  Implant Name Type Inv. Item Serial No. Manufacturer Lot No. LRB No. Used  LENS IOL ANT DIOP 20.0 - [id number] Intraocular Lens LENS IOL ANT DIOP 20.0 [id number] ALCON   Left 1     SURGEON:  Wyonia Hough, MD   ANESTHESIA:  Retrobulbar block of Xylocaine and Bupivacaine.   COMPLICATIONS:  None.   DESCRIPTION OF PROCEDURE:  The patient was identified in the holding room and transported to the operating room and placed in the supine position under the operating microscope.  The left eye was identified as the operative eye and a retrobulbar block was performed under intravenous sedation.  It was then prepped and draped in the usual sterile ophthalmic fashion.   A 1.5 millimeter clear-corneal paracentesis was made at the 3:00 and 9:00 positions.  The anterior chamber was filled with Viscoat viscoelastic.  A conjunctival peritomy was performed with Westcott scissors and hemostasis was achieved with cautery.  A 54mm scleral tunnel incision was made at the 12:00 position.  A 2.4 mm incision was made into the anterior chamber.  The posterior chamber lens was elevated into the anterior chamber with the viscoat cannula.  The haptic was grasped and externalized.  The full 6 mm incision was extended with the keratome.  The lens was explanted.  Anterior vitrectomy was done bimanually throught the 1.5 mm incisions.  A peripheral iridotomy was placed at the 12:00 position.    Wounds were checked with Weck cells and noted to be free of incarcerated vitreous strands.  The anterior chamber was dilated with Provisc viscoelastic.   An [id number] Anterior chamber lens was placed into the anterior chamber.  Two 10-0 nylon  sutures were placed through the 6 mm incision.  The lens was rotated 90 degrees so that the haptics were in the 3 and 9 o'clock positions.   The bimanual vitrector was used to aspirate the viscoelastic. Wounds were again checked to ensure no vitreous was present.  The lens was well centered and the pupil was round.  Miochol was placed into the anterior chamber.   Wounds were hydrated with balanced salt solution.  The anterior chamber was inflated to a physiologic pressure with balanced salt solution.  No wound leaks were noted. Cefuroxime 0.1 ml of a 10mg /ml solution was injected into the anterior chamber for a dose of 1 mg of intracameral antibiotic at the completion of the case. The conjunctival peritomy was closed with cautery. The eye was patched.  The patient was taken to the recovery room in stable condition without complications of anesthesia or surgery.   Breeanne Oblinger 03/20/2017, 11:52 AM

## 2017-03-20 NOTE — Anesthesia Preprocedure Evaluation (Signed)
Anesthesia Evaluation  Patient identified by MRN, date of birth, ID band  Reviewed: NPO status   History of Anesthesia Complications Negative for: history of anesthetic complications  Airway Mallampati: III  TM Distance: >3 FB Neck ROM: full    Dental no notable dental hx.    Pulmonary neg pulmonary ROS,    Pulmonary exam normal        Cardiovascular Exercise Tolerance: Good + CAD and + Past MI (2011; cardiac arrest;)  Normal cardiovascular exam+ dysrhythmias (ablation) Atrial Fibrillation + pacemaker + Valvular Problems/Murmurs (mod to severe MR) MR   P. HTN.   Neuro/Psych LBpain;  Neck pain, posterior head. negative neurological ROS  negative psych ROS   GI/Hepatic negative GI ROS, Neg liver ROS, GERD  Controlled,  Endo/Other  negative endocrine ROS  Renal/GU negative Renal ROS  negative genitourinary   Musculoskeletal  (+) Arthritis ,   Abdominal   Peds  Hematology R breast cancer, in situ.;  Melanoma, leg;   Anesthesia Other Findings cath 2017:    1. Normal coronary anatomy 2. Mildly reduced left ventricular function with mild global hypokinesis, with LVEF 46% 3. Moderate to severe mitral regurgitation 4. Pulmonary hypertension;   Last coumadin : 9/25;        Reproductive/Obstetrics                             Anesthesia Physical Anesthesia Plan  ASA: III  Anesthesia Plan: MAC   Post-op Pain Management:    Induction:   PONV Risk Score and Plan:   Airway Management Planned:   Additional Equipment:   Intra-op Plan:   Post-operative Plan:   Informed Consent: I have reviewed the patients History and Physical, chart, labs and discussed the procedure including the risks, benefits and alternatives for the proposed anesthesia with the patient or authorized representative who has indicated his/her understanding and acceptance.     Plan Discussed with:  CRNA  Anesthesia Plan Comments:         Anesthesia Quick Evaluation

## 2017-03-20 NOTE — Anesthesia Postprocedure Evaluation (Signed)
Anesthesia Post Note  Patient: Charlotte Glover  Procedure(s) Performed: Procedure(s) (LRB): ANTERIOR VITRECTOMY (Left) IOL EXCHANGE (Left) PERIPHERAL IRIDECTOMY (Left)  Patient location during evaluation: PACU Anesthesia Type: MAC Level of consciousness: awake and alert Pain management: pain level controlled Vital Signs Assessment: post-procedure vital signs reviewed and stable Respiratory status: spontaneous breathing, nonlabored ventilation, respiratory function stable and patient connected to nasal cannula oxygen Cardiovascular status: stable and blood pressure returned to baseline Postop Assessment: no apparent nausea or vomiting Anesthetic complications: no    Currie Dennin

## 2017-03-20 NOTE — Transfer of Care (Signed)
Immediate Anesthesia Transfer of Care Note  Patient: Charlotte Glover  Procedure(s) Performed: Procedure(s) with comments: ANTERIOR VITRECTOMY (Left) - IVA TOPICAL LEFT IOL EXCHANGE (Left) PERIPHERAL IRIDECTOMY (Left)  Patient Location: PACU  Anesthesia Type: MAC  Level of Consciousness: awake, alert  and patient cooperative  Airway and Oxygen Therapy: Patient Spontanous Breathing and Patient connected to supplemental oxygen  Post-op Assessment: Post-op Vital signs reviewed, Patient's Cardiovascular Status Stable, Respiratory Function Stable, Patent Airway and No signs of Nausea or vomiting  Post-op Vital Signs: Reviewed and stable  Complications: No apparent anesthesia complications

## 2017-03-21 ENCOUNTER — Encounter: Payer: Self-pay | Admitting: Ophthalmology

## 2017-03-21 ENCOUNTER — Other Ambulatory Visit: Payer: Self-pay | Admitting: Gastroenterology

## 2017-03-21 DIAGNOSIS — R1319 Other dysphagia: Secondary | ICD-10-CM

## 2017-03-21 DIAGNOSIS — R1310 Dysphagia, unspecified: Secondary | ICD-10-CM

## 2017-03-28 ENCOUNTER — Ambulatory Visit
Admission: RE | Admit: 2017-03-28 | Discharge: 2017-03-28 | Disposition: A | Discharge status: Home / Self Care | Payer: Medicare Other | Source: Ambulatory Visit | Attending: Gastroenterology | Admitting: Gastroenterology

## 2017-03-28 DIAGNOSIS — R1310 Dysphagia, unspecified: Secondary | ICD-10-CM | POA: Insufficient documentation

## 2017-03-28 DIAGNOSIS — K219 Gastro-esophageal reflux disease without esophagitis: Secondary | ICD-10-CM | POA: Diagnosis not present

## 2017-03-28 DIAGNOSIS — R1319 Other dysphagia: Secondary | ICD-10-CM

## 2017-04-12 DIAGNOSIS — I872 Venous insufficiency (chronic) (peripheral): Secondary | ICD-10-CM | POA: Insufficient documentation

## 2017-04-15 ENCOUNTER — Other Ambulatory Visit: Payer: Self-pay | Admitting: Infectious Diseases

## 2017-04-15 DIAGNOSIS — D0511 Intraductal carcinoma in situ of right breast: Secondary | ICD-10-CM

## 2017-05-06 DIAGNOSIS — B029 Zoster without complications: Secondary | ICD-10-CM | POA: Insufficient documentation

## 2017-05-27 ENCOUNTER — Ambulatory Visit
Admission: RE | Admit: 2017-05-27 | Discharge: 2017-05-27 | Disposition: A | Discharge status: Home / Self Care | Payer: Medicare Other | Source: Ambulatory Visit | Attending: Infectious Diseases | Admitting: Infectious Diseases

## 2017-05-27 DIAGNOSIS — D0511 Intraductal carcinoma in situ of right breast: Secondary | ICD-10-CM

## 2017-05-27 HISTORY — DX: Malignant neoplasm of unspecified site of unspecified female breast: C50.919

## 2017-07-02 IMAGING — CT CT SHOULDER*R* W/CM
1 series · 12 of 14 positions shown, 15 images · non-contrast
Comparison: Image from contrast injection reviewed.

CLINICAL DATA: Chronic right shoulder pain. Rotator cuff
tendinopathy. No known injury.

EXAM:
CT ARTHROGRAPHY OF THE RIGHT SHOULDER
TECHNIQUE: Multidetector CT imaging was performed following the standard
protocol after injection of dilute contrast into the joint.

[Series 10: ax st · axial · 0.27mm/px · z∈[-115,+3]mm · 12 of 77 slices shown, 15 images]
[im 6/77  soft-tissue]
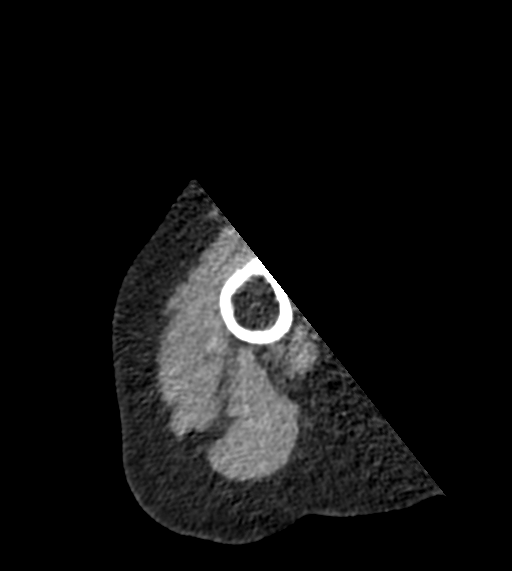
[im 6/77  bone]
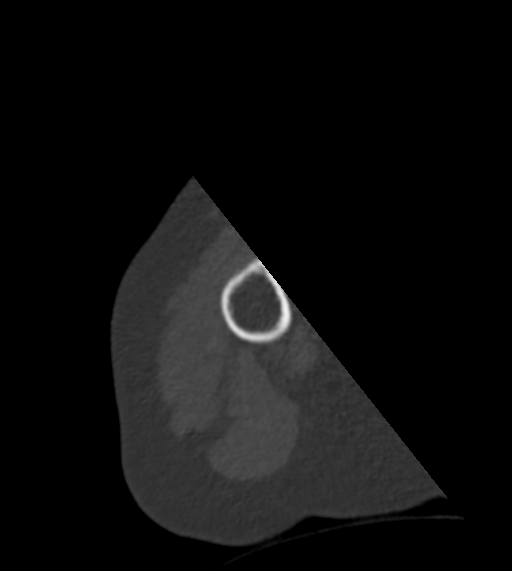
[im 12/77  bone]
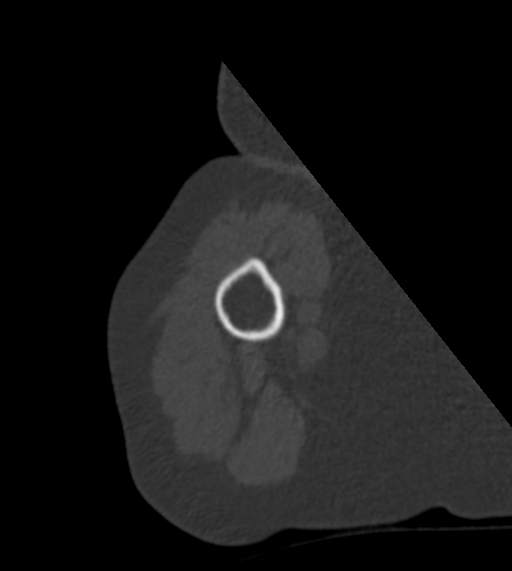
[im 18/77  bone]
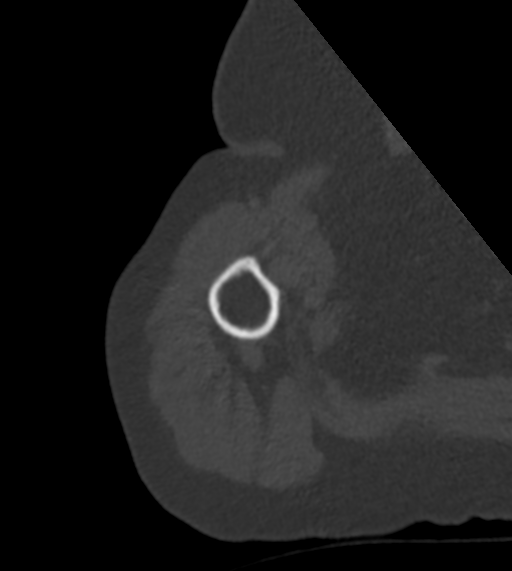
[im 24/77  bone]
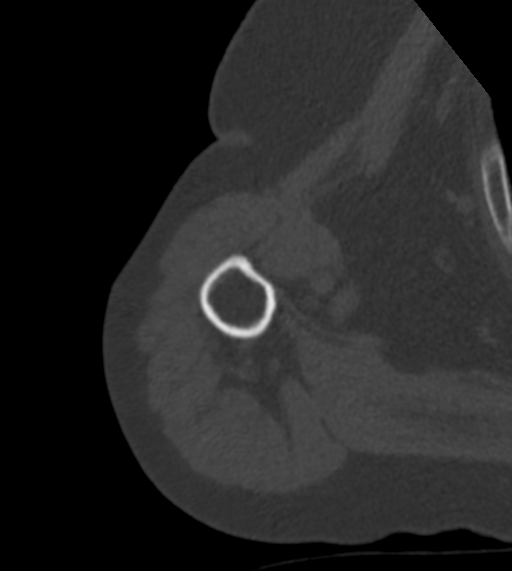
[im 30/77  soft-tissue]
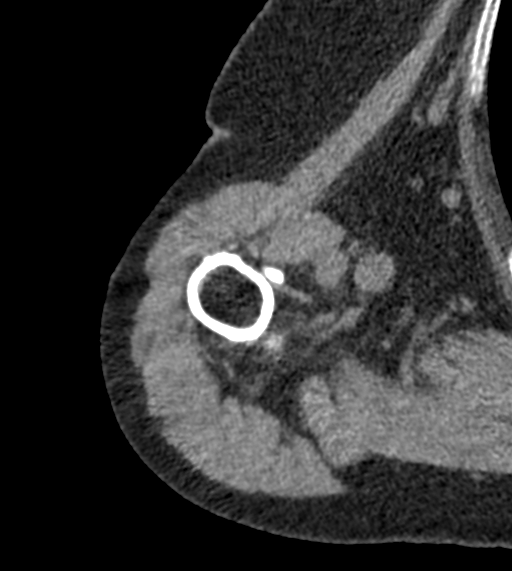
[im 30/77  bone]
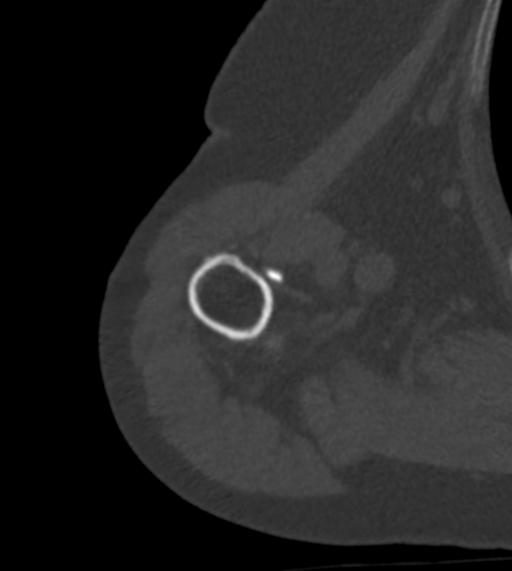
[im 36/77  bone]
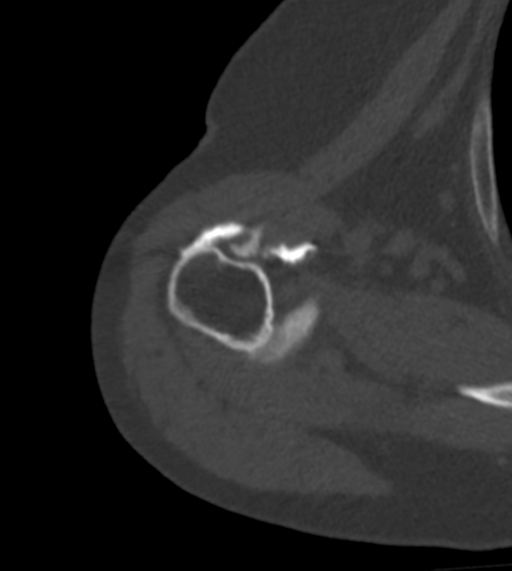
[im 41/77  bone]
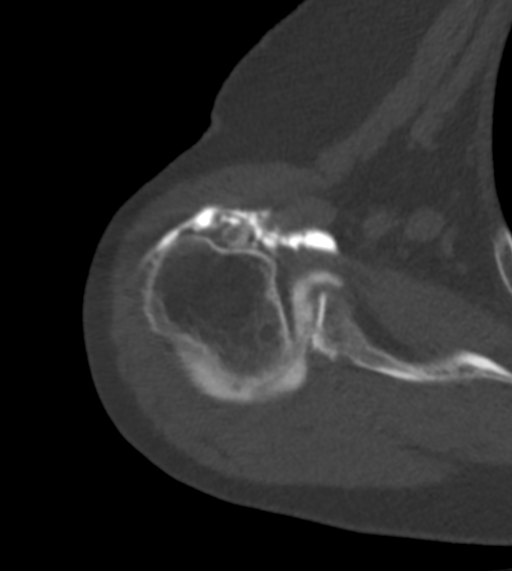
[im 47/77  bone]
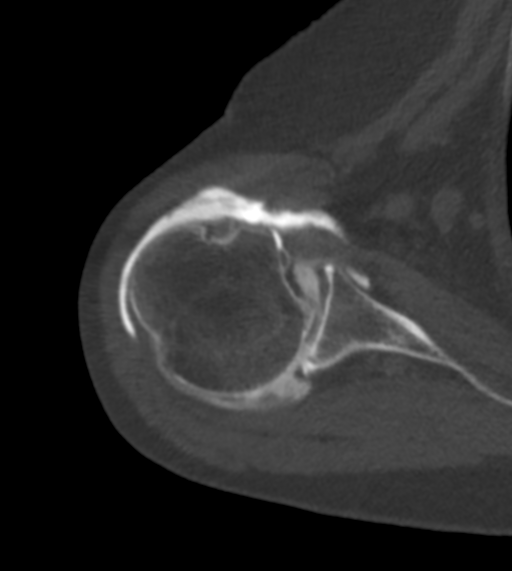
[im 53/77  soft-tissue]
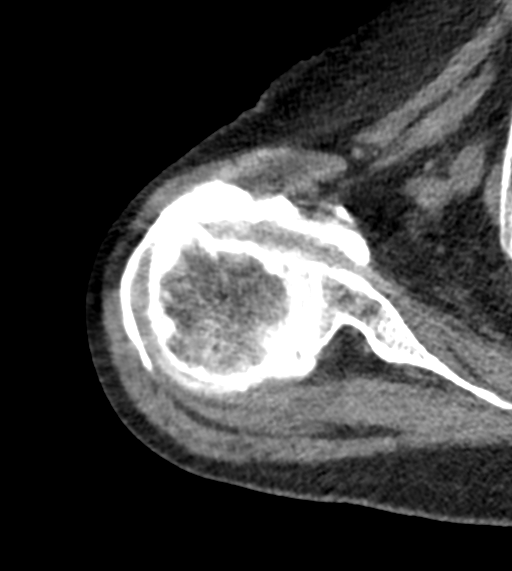
[im 53/77  bone]
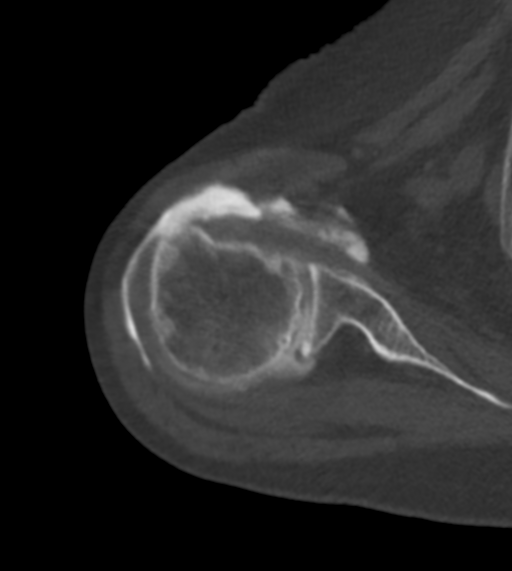
[im 59/77  bone]
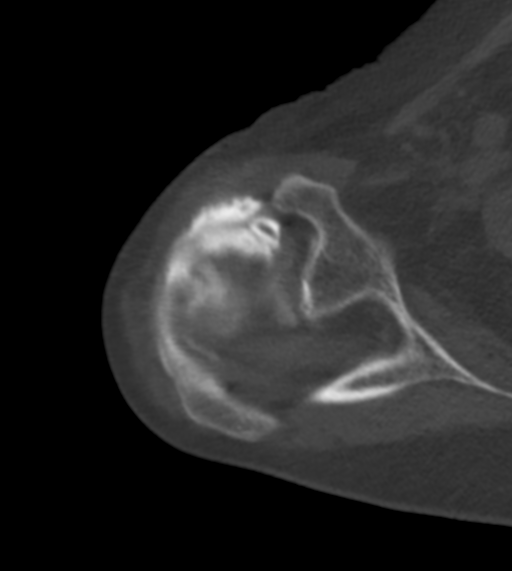
[im 65/77  bone]
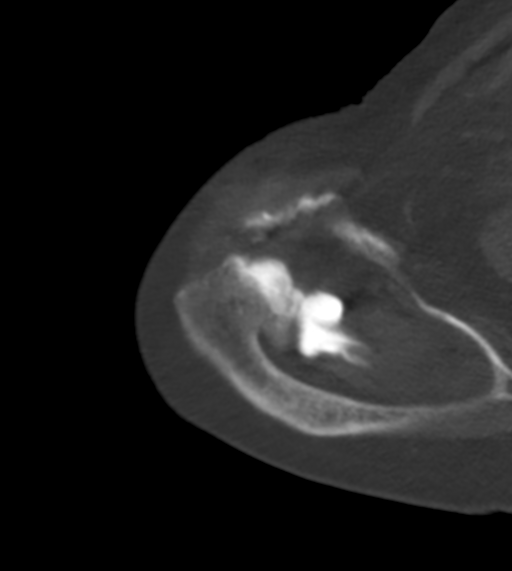
[im 71/77  bone]
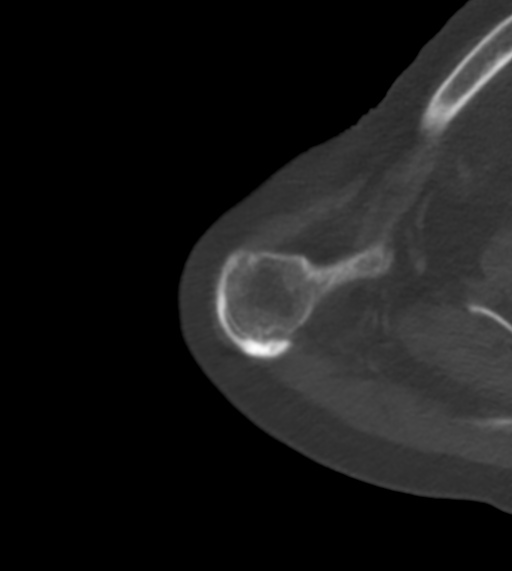

[12 of 14 positions shown; findings below may reference images not displayed]

FINDINGS: The rotator cuff appears thickened consistent with tendinopathy.
There is a partial width, full-thickness tear of the leading edge of
the supraspinatus measuring approximately 0.7 cm from front to back
with little to no retraction. There is no atrophy. The rotator cuff
is otherwise intact.

Long head biceps tendon is intact. The humeral head is posteriorly
subluxed on the glenoid suggestive of glenohumeral joint
instability. The posterior labrum appears degenerated without focal
tear. The patient has glenohumeral osteoarthritis with a small
osteophyte off the humeral head and cartilage thinning. Bulky
acromioclavicular degenerative change and a prominent subacromial
spur also seen. Contrast extravasates into the
subacromial/subdeltoid bursa.
IMPRESSION: Rotator cuff tendinopathy with a 0.7 cm from front to back
full-thickness tear of the leading edge of the supraspinatus with
minimal retraction. No atrophy.

Bulky acromioclavicular osteoarthritis and prominent subacromial
spurring.

Moderate glenohumeral osteoarthritis.

Posterior subluxation of the humeral head on the glenoid suggestive
of glenohumeral joint instability.

## 2017-12-06 ENCOUNTER — Other Ambulatory Visit: Payer: Self-pay | Admitting: Orthopedic Surgery

## 2017-12-06 DIAGNOSIS — M545 Low back pain: Secondary | ICD-10-CM

## 2017-12-23 ENCOUNTER — Ambulatory Visit (HOSPITAL_COMMUNITY): Admission: RE | Admit: 2017-12-23 | Payer: Medicare Other | Source: Ambulatory Visit

## 2017-12-27 DIAGNOSIS — N183 Chronic kidney disease, stage 3 unspecified: Secondary | ICD-10-CM | POA: Insufficient documentation

## 2017-12-30 ENCOUNTER — Other Ambulatory Visit: Payer: Self-pay | Admitting: Internal Medicine

## 2017-12-30 DIAGNOSIS — R918 Other nonspecific abnormal finding of lung field: Secondary | ICD-10-CM

## 2018-01-02 ENCOUNTER — Ambulatory Visit
Admission: RE | Admit: 2018-01-02 | Discharge: 2018-01-02 | Disposition: A | Discharge status: Home / Self Care | Payer: Medicare Other | Source: Ambulatory Visit | Attending: Internal Medicine | Admitting: Internal Medicine

## 2018-01-02 ENCOUNTER — Encounter (INDEPENDENT_AMBULATORY_CARE_PROVIDER_SITE_OTHER): Payer: Self-pay

## 2018-01-02 DIAGNOSIS — R918 Other nonspecific abnormal finding of lung field: Secondary | ICD-10-CM | POA: Diagnosis present

## 2018-01-02 DIAGNOSIS — R911 Solitary pulmonary nodule: Secondary | ICD-10-CM | POA: Diagnosis not present

## 2018-01-06 ENCOUNTER — Ambulatory Visit (HOSPITAL_COMMUNITY)
Admission: RE | Admit: 2018-01-06 | Discharge: 2018-01-06 | Disposition: A | Discharge status: Home / Self Care | Payer: Medicare Other | Source: Ambulatory Visit | Attending: Orthopedic Surgery | Admitting: Orthopedic Surgery

## 2018-01-06 ENCOUNTER — Ambulatory Visit (HOSPITAL_COMMUNITY): Payer: BLUE CROSS/BLUE SHIELD

## 2018-01-06 DIAGNOSIS — M5136 Other intervertebral disc degeneration, lumbar region: Secondary | ICD-10-CM | POA: Diagnosis not present

## 2018-01-06 DIAGNOSIS — M48061 Spinal stenosis, lumbar region without neurogenic claudication: Secondary | ICD-10-CM | POA: Diagnosis not present

## 2018-01-06 DIAGNOSIS — M545 Low back pain: Secondary | ICD-10-CM | POA: Diagnosis not present

## 2018-01-07 ENCOUNTER — Other Ambulatory Visit: Payer: Self-pay | Admitting: Internal Medicine

## 2018-01-07 DIAGNOSIS — R918 Other nonspecific abnormal finding of lung field: Secondary | ICD-10-CM

## 2018-01-14 ENCOUNTER — Ambulatory Visit
Admission: RE | Admit: 2018-01-14 | Discharge: 2018-01-14 | Disposition: A | Discharge status: Home / Self Care | Payer: Medicare Other | Source: Ambulatory Visit | Attending: Internal Medicine | Admitting: Internal Medicine

## 2018-01-14 DIAGNOSIS — R911 Solitary pulmonary nodule: Secondary | ICD-10-CM | POA: Diagnosis not present

## 2018-01-14 DIAGNOSIS — R918 Other nonspecific abnormal finding of lung field: Secondary | ICD-10-CM

## 2018-01-14 LAB — GLUCOSE, CAPILLARY: Glucose-Capillary: 97 mg/dL (ref 70–99)

## 2018-01-14 MED ORDER — FLUDEOXYGLUCOSE F - 18 (FDG) INJECTION
9.6500 | Freq: Once | INTRAVENOUS | Status: AC | PRN
  Administered 2018-01-14: 9.65 via INTRAVENOUS

## 2018-11-05 DIAGNOSIS — R700 Elevated erythrocyte sedimentation rate: Secondary | ICD-10-CM | POA: Insufficient documentation

## 2018-11-20 DIAGNOSIS — R519 Headache, unspecified: Secondary | ICD-10-CM | POA: Insufficient documentation

## 2018-11-24 ENCOUNTER — Other Ambulatory Visit: Payer: Self-pay | Admitting: Internal Medicine

## 2018-11-24 DIAGNOSIS — R519 Headache, unspecified: Secondary | ICD-10-CM

## 2018-11-24 DIAGNOSIS — R42 Dizziness and giddiness: Secondary | ICD-10-CM

## 2018-11-25 ENCOUNTER — Encounter: Payer: Self-pay | Admitting: Emergency Medicine

## 2018-11-25 ENCOUNTER — Other Ambulatory Visit: Payer: Self-pay

## 2018-11-25 ENCOUNTER — Emergency Department: Payer: Medicare Other

## 2018-11-25 ENCOUNTER — Emergency Department
Admission: EM | Admit: 2018-11-25 | Discharge: 2018-11-25 | Disposition: A | Discharge status: Home / Self Care | Payer: Medicare Other | Attending: Emergency Medicine | Admitting: Emergency Medicine

## 2018-11-25 DIAGNOSIS — Z853 Personal history of malignant neoplasm of breast: Secondary | ICD-10-CM | POA: Diagnosis not present

## 2018-11-25 DIAGNOSIS — I252 Old myocardial infarction: Secondary | ICD-10-CM | POA: Insufficient documentation

## 2018-11-25 DIAGNOSIS — Z7901 Long term (current) use of anticoagulants: Secondary | ICD-10-CM | POA: Diagnosis not present

## 2018-11-25 DIAGNOSIS — Z79899 Other long term (current) drug therapy: Secondary | ICD-10-CM | POA: Diagnosis not present

## 2018-11-25 DIAGNOSIS — E876 Hypokalemia: Secondary | ICD-10-CM | POA: Diagnosis not present

## 2018-11-25 DIAGNOSIS — Z950 Presence of cardiac pacemaker: Secondary | ICD-10-CM | POA: Insufficient documentation

## 2018-11-25 DIAGNOSIS — R0789 Other chest pain: Secondary | ICD-10-CM | POA: Diagnosis not present

## 2018-11-25 DIAGNOSIS — I2510 Atherosclerotic heart disease of native coronary artery without angina pectoris: Secondary | ICD-10-CM | POA: Insufficient documentation

## 2018-11-25 DIAGNOSIS — R5383 Other fatigue: Secondary | ICD-10-CM | POA: Diagnosis not present

## 2018-11-25 LAB — BASIC METABOLIC PANEL
Anion gap: 15 (ref 5–15)
BUN: 37 mg/dL — ABNORMAL HIGH (ref 8–23)
CO2: 30 mmol/L (ref 22–32)
Calcium: 9.1 mg/dL (ref 8.9–10.3)
Chloride: 92 mmol/L — ABNORMAL LOW (ref 98–111)
Creatinine, Ser: 1.17 mg/dL — ABNORMAL HIGH (ref 0.44–1.00)
GFR calc Af Amer: 48 mL/min — ABNORMAL LOW (ref >60)
GFR calc non Af Amer: 42 mL/min — ABNORMAL LOW (ref >60)
Glucose, Bld: 117 mg/dL — ABNORMAL HIGH (ref 70–99)
Potassium: 2.8 mmol/L — ABNORMAL LOW (ref 3.5–5.1)
Sodium: 137 mmol/L (ref 135–145)

## 2018-11-25 LAB — CBC
HCT: 43.3 % (ref 36.0–46.0)
Hemoglobin: 14.7 g/dL (ref 12.0–15.0)
MCH: 30.8 pg (ref 26.0–34.0)
MCHC: 33.9 g/dL (ref 30.0–36.0)
MCV: 90.6 fL (ref 80.0–100.0)
Platelets: 251 10*3/uL (ref 150–400)
RBC: 4.78 MIL/uL (ref 3.87–5.11)
RDW: 15.8 % — ABNORMAL HIGH (ref 11.5–15.5)
WBC: 15.4 10*3/uL — ABNORMAL HIGH (ref 4.0–10.5)
nRBC: 0 % (ref 0.0–0.2)

## 2018-11-25 LAB — TROPONIN I
Troponin I: <0.03 ng/mL (ref <0.03)
Troponin I: <0.03 ng/mL (ref <0.03)

## 2018-11-25 LAB — MAGNESIUM: Magnesium: 2.3 mg/dL (ref 1.7–2.4)

## 2018-11-25 LAB — BRAIN NATRIURETIC PEPTIDE: B Natriuretic Peptide: 55 pg/mL (ref 0.0–100.0)

## 2018-11-25 MED ORDER — SODIUM CHLORIDE 0.9% FLUSH
3.0000 mL | Freq: Once | INTRAVENOUS | Status: AC
  Administered 2018-11-25: 3 mL via INTRAVENOUS

## 2018-11-25 MED ORDER — POTASSIUM CHLORIDE CRYS ER 20 MEQ PO TBCR: 20.0000 meq | EXTENDED_RELEASE_TABLET | Freq: Two times a day (BID) | ORAL | 0 refills | Status: DC

## 2018-11-25 MED ORDER — POTASSIUM CHLORIDE CRYS ER 20 MEQ PO TBCR
40.0000 meq | EXTENDED_RELEASE_TABLET | Freq: Once | ORAL | Status: AC
  Administered 2018-11-25: 12:00:00 40 meq via ORAL
  Filled 2018-11-25: qty 2

## 2018-11-25 MED ORDER — POTASSIUM CHLORIDE 10 MEQ/100ML IV SOLN
10.0000 meq | INTRAVENOUS | Status: AC
  Administered 2018-11-25 (×2): 10 meq via INTRAVENOUS
  Filled 2018-11-25 (×2): qty 100

## 2018-11-25 NOTE — ED Triage Notes (Signed)
Pt c/o having an episode of chest pain that started around 935am lasting about 5-58min. States she took 1 nitro was having nausea with the pain and b/p dropped to 85/65. Denies any chest pain at present.

## 2018-11-25 NOTE — ED Notes (Signed)
Was at home doing a crossword puzzle and developed severe crushing central chest pain.  She took ntg and it went away.  She called dr Josefa Half office and they tiold her to come here.  She has no pain now and is in nad.

## 2018-11-25 NOTE — ED Provider Notes (Signed)
[facility] EMERGENCY DEPARTMENT Provider Note   CSN: [id number] Arrival date & time: 11/25/18  1053    History   Chief Complaint Chief Complaint  Patient presents with  . Chest Pain    HPI Aloma Boch is a 85 y.o. female.     HPI 85 year old female with past medical history of A. fib, coronary disease, here with transient chest pain.  The patient states that she was in her usual state of health until this morning.  She felt acute onset of very mild chest pressure.  She took a nitroglycerin and it relieved.  She had eaten 1 or 2 hours prior to this.  She has a history of esophageal spasms states it felt somewhat similar to that, though slightly worse.  She called her cardiologist, and did not want to come, but was recommended to come for evaluation.  She now feels completely back to her baseline.  She states her symptoms does not feel similar to her previous chest coronary episodes.  Denies any other complaints.  Nursing medication changes.  Of note, she did take metolazone yesterday and has been fighting increased fluid.  She is been taking her other meds as prescribed.  Denies any shortness of breath.  No palpitations.  No fevers or chills.   Past Medical History:  Diagnosis Date  . Arthritis   . Atrial fibrillation ([place])   . Breast cancer ([place]) 2015   DCIS  . Cancer ([place])   . Cardiac arrest ([place])   . Chickenpox   . Coronary artery disease   . Dyspnea   . Dysrhythmia   . Edema   . GERD (gastroesophageal reflux disease)   . Hearing aid worn    bilateral  . History of MI (myocardial infarction)   . HOH (hard of hearing)   . Myocardial infarction ([place]) 01/01/2013  . S/P ablation of atrial fibrillation   . Shingles     Patient Active Problem List   Diagnosis Date Noted  . Cardiac pacemaker 05/18/2014  . Atrial fibrillation ([place]) 11/27/2011  . Coronary artery disease 11/27/2011    Past Surgical History:  Procedure Laterality Date  .  ANTERIOR VITRECTOMY Left 03/20/2017   Procedure: ANTERIOR VITRECTOMY;  Surgeon: Leandrew Koyanagi, MD;  Location: [facility];  Service: Ophthalmology;  Laterality: Left;  IVA TOPICAL LEFT  . ATRIAL ABLATION SURGERY    . BREAST BIOPSY Right 2015   + for DCIS   . BREAST SURGERY    . CARDIAC CATHETERIZATION    . CARDIAC CATHETERIZATION Bilateral 05/08/2016   Procedure: Right/Left Heart Cath and Coronary Angiography;  Surgeon: Isaias Cowman, MD;  Location: [facility];  Service: Cardiovascular;  Laterality: Bilateral;  . CATARACT EXTRACTION W/PHACO Left 03/20/2017   Procedure: IOL EXCHANGE;  Surgeon: Leandrew Koyanagi, MD;  Location: [facility];  Service: Ophthalmology;  Laterality: Left;  . CHOLECYSTECTOMY    . DILATION AND CURETTAGE OF UTERUS    . ESOPHAGOGASTRODUODENOSCOPY    . PACEMAKER INSERTION    . PERIPHERAL IRIDOTOMY Left 03/20/2017   Procedure: PERIPHERAL IRIDECTOMY;  Surgeon: Leandrew Koyanagi, MD;  Location: [facility];  Service: Ophthalmology;  Laterality: Left;  . ROTATOR CUFF REPAIR Left   . TEE WITHOUT CARDIOVERSION N/A 05/16/2016   Procedure: TRANSESOPHAGEAL ECHOCARDIOGRAM (TEE);  Surgeon: Teodoro Spray, MD;  Location: [facility];  Service: Cardiovascular;  Laterality: N/A;  . TONSILLECTOMY    . uterine polyp       OB History  No obstetric history on file.      Home Medications    Prior to Admission medications   Medication Sig Start Date End Date Taking? Authorizing Provider  Acetaminophen 500 MG coapsule Take by mouth.    [provider]  calcium carbonate (TUMS - DOSED IN MG ELEMENTAL CALCIUM) 500 MG chewable tablet Chew 1-2 tablets by mouth daily as needed for indigestion or heartburn.    [provider]  furosemide (LASIX) 40 MG tablet Take 40 mg by mouth 2 (two) times daily.  03/30/16   [provider]  gabapentin (NEURONTIN) 300 MG capsule Take 1,200-1,500 mg by mouth 2 (two) times  daily. Take 1500mg  in the morning and 1200mg  at night    [provider]  gabapentin (NEURONTIN) 300 MG capsule Take by mouth. 07/14/16   [provider]  losartan (COZAAR) 25 MG tablet Take 25 mg by mouth daily.    [provider]  metolazone (ZAROXOLYN) 5 MG tablet Take 5 mg by mouth daily as needed.    [provider]  Multiple Vitamin (MULTIVITAMIN) tablet Take 1 tablet by mouth daily.    [provider]  Multiple Vitamins-Minerals (PRESERVISION AREDS 2) CAPS Take 1 capsule by mouth 2 (two) times daily.    [provider]  naproxen sodium (ANAPROX) 220 MG tablet Take 220 mg by mouth as needed.    [provider]  nitroGLYCERIN (NITROSTAT) 0.4 MG SL tablet Place 0.4 mg under the tongue every 5 (five) minutes x 2 doses as needed for chest pain.  04/01/13   [provider]  nortriptyline (PAMELOR) 10 MG capsule Take 10 mg by mouth every evening. 01/27/16   [provider]  nortriptyline (PAMELOR) 50 MG capsule Take by mouth.    [provider]  omeprazole (PRILOSEC) 20 MG capsule Take 20 mg by mouth 2 (two) times daily before a meal.    [provider]  omeprazole (PRILOSEC) 20 MG capsule TAKE 1 CAPSULE(20 MG) BY MOUTH TWICE DAILY 07/23/16   [provider]  Polyvinyl Alcohol-Povidone (REFRESH OP) Apply 1 drop to eye daily as needed (dry eyes).    [provider]  potassium chloride SA (K-DUR) 20 MEQ tablet Take 1 tablet (20 mEq total) by mouth 2 (two) times daily for 5 days. 11/25/18 11/30/18  Duffy Bruce, MD  senna (SENOKOT) 8.6 MG TABS tablet Take 1 tablet by mouth daily as needed for mild constipation.    [provider]  warfarin (COUMADIN) 5 MG tablet Take 5 mg by mouth daily.    [provider]    Family History Family History  Problem Relation Age of Onset  . Breast cancer Mother   . Cancer Sister   . Cancer Brother   . Cancer Daughter   . Breast cancer  Daughter   . Cancer Son     Social History Social History   Tobacco Use  . Smoking status: Never Smoker  . Smokeless tobacco: Never Used  Substance Use Topics  . Alcohol use: Yes    Alcohol/week: 7.0 standard drinks    Types: 7 Shots of liquor per week    Comment:  (1 scotch daily)  . Drug use: No     Allergies   Amiodarone; Metoprolol; Pantoprazole; and Tape   Review of Systems Review of Systems  Constitutional: Positive for fatigue. Negative for chills and fever.  HENT: Negative for congestion and rhinorrhea.   Eyes: Negative for visual disturbance.  Respiratory: Positive for chest  tightness. Negative for cough, shortness of breath and wheezing.   Cardiovascular: Negative for chest pain and leg swelling.  Gastrointestinal: Negative for abdominal pain, diarrhea, nausea and vomiting.  Genitourinary: Negative for dysuria and flank pain.  Musculoskeletal: Negative for neck pain and neck stiffness.  Skin: Negative for rash and wound.  Allergic/Immunologic: Negative for immunocompromised state.  Neurological: Negative for syncope, weakness and headaches.  All other systems reviewed and are negative.    Physical Exam Updated Vital Signs BP 131/66   Pulse 70   Temp 98.1 F (36.7 C) (Oral)   Resp 19   Ht 5\' 9"  (1.753 m)   Wt 77.1 kg   SpO2 100%   BMI 25.10 kg/m   Physical Exam Vitals signs and nursing note reviewed.  Constitutional:      General: She is not in acute distress.    Appearance: She is well-developed.  HENT:     Head: Normocephalic and atraumatic.  Eyes:     Conjunctiva/sclera: Conjunctivae normal.  Neck:     Musculoskeletal: Neck supple.  Cardiovascular:     Rate and Rhythm: Normal rate and regular rhythm.     Heart sounds: Normal heart sounds. No murmur. No friction rub.  Pulmonary:     Effort: Pulmonary effort is normal. No respiratory distress.     Breath sounds: Normal breath sounds. No wheezing or rales.  Abdominal:     General: There  is no distension.     Palpations: Abdomen is soft.     Tenderness: There is no abdominal tenderness.  Musculoskeletal:     Right lower leg: Edema present.     Left lower leg: Edema present.  Skin:    General: Skin is warm.     Capillary Refill: Capillary refill takes less than 2 seconds.  Neurological:     Mental Status: She is alert and oriented to person, place, and time.     Motor: No abnormal muscle tone.      ED Treatments / Results  Labs (all labs ordered are listed, but only abnormal results are displayed) Labs Reviewed  BASIC METABOLIC PANEL - Abnormal; Notable for the following components:      Result Value   Potassium 2.8 (*)    Chloride 92 (*)    Glucose, Bld 117 (*)    BUN 37 (*)    Creatinine, Ser 1.17 (*)    GFR calc non Af Amer 42 (*)    GFR calc Af Amer 48 (*)    All other components within normal limits  CBC - Abnormal; Notable for the following components:   WBC 15.4 (*)    RDW 15.8 (*)    All other components within normal limits  TROPONIN I  BRAIN NATRIURETIC PEPTIDE  MAGNESIUM  TROPONIN I    EKG None  Radiology Dg Chest 2 View  Result Date: 11/25/2018 CLINICAL DATA:  Chest pain EXAM: CHEST - 2 VIEW COMPARISON:  PET-CT January 14, 2018. FINDINGS: There is no edema or consolidation. Previously noted nodular opacity in the medial left base is not appreciable by radiography. Heart is borderline enlarged with pacemaker leads attached to the right atrium, right ventricle, and coronary sinus. No adenopathy. No bone lesions. IMPRESSION: No edema or consolidation. Heart borderline enlarged with pacemaker leads as described. No adenopathy evident. Electronically Signed   By: Lowella Grip III M.D.   On: 11/25/2018 11:41    Procedures Procedures (including critical care time)  Medications Ordered in ED Medications  sodium chloride  flush (NS) 0.9 % injection 3 mL (3 mLs Intravenous Given 11/25/18 1110)  potassium chloride 10 mEq in 100 mL IVPB (0 mEq  Intravenous Stopped 11/25/18 1456)  potassium chloride SA (K-DUR) CR tablet 40 mEq (40 mEq Oral Given 11/25/18 1222)     Initial Impression / Assessment and Plan / ED Course  I have reviewed the triage vital signs and the nursing notes.  Pertinent labs & imaging results that were available during my care of the patient were reviewed by me and considered in my medical decision making (see chart for details).  Clinical Course as of Nov 25 1907  Tue Nov 25, 2018  1315 Discussed with Dr. Saralyn Pilar. Given normal coronaries in 2017, with no pain here and neg trop, low concern for ACS. Plan to replace    [CI]    Clinical Course User Index [CI] Duffy Bruce, MD       85 year old female here with transient episode of chest pain, now resolved.  She has history of esophageal spasms and I suspect this could be the etiology of her pain, though she also has a coronary history.  However, coronary history is somewhat questionable and I reviewed with her cardiologist, and saw that she had a completely clean cath in 2017, and does not actually have coronary disease.  He recommends delta troponin and discharge if negative.  Interrogated her pacemaker, which shows no evidence of persistent or severe arrhythmia.  Of note, her labs do show mild hypokalemia which could have contributed to esophageal or other muscular spasm.  Will replace her potassium, repeat troponin, and discharge if negative as per cardiologist.  K replaced, repeat trop is negative.Pt feels much better and has had no CP here. She is requesting d/c home. Had a long shared decision making discussion with her re: obs vs d/c. She elects for d/c. Understands need to return with any recurrent CP or other concerning sx. Will d/c home.  Final Clinical Impressions(s) / ED Diagnoses   Final diagnoses:  Atypical chest pain  Hypokalemia    ED Discharge Orders         Ordered    potassium chloride SA (K-DUR) 20 MEQ tablet  2 times daily      11/25/18 1527           Duffy Bruce, MD 11/25/18 1909

## 2018-11-25 NOTE — ED Notes (Signed)
Attempted to interrogate pacer, battery dead in device, called [employer] and was told they would be coming to assess this

## 2018-11-25 NOTE — ED Notes (Signed)
Pt ambulated to toilet unassisted, gait steady

## 2018-11-25 NOTE — Discharge Instructions (Signed)
For the next few days, take potassium TWICE a day instead of once  Call Dr. Saralyn Pilar tomorrow to discuss follow-up  RETURN to the ER if you develop recurrent chest pain

## 2018-11-25 NOTE — ED Notes (Signed)
[employer] rep called back and will be here in about 45 minutes to interrogate pacer.

## 2018-12-01 ENCOUNTER — Ambulatory Visit
Admission: RE | Admit: 2018-12-01 | Discharge: 2018-12-01 | Disposition: A | Discharge status: Home / Self Care | Payer: Medicare Other | Source: Ambulatory Visit | Attending: Internal Medicine | Admitting: Internal Medicine

## 2018-12-01 ENCOUNTER — Other Ambulatory Visit: Payer: Self-pay

## 2018-12-01 ENCOUNTER — Other Ambulatory Visit: Payer: Self-pay | Admitting: Internal Medicine

## 2018-12-01 DIAGNOSIS — R519 Headache, unspecified: Secondary | ICD-10-CM

## 2018-12-01 DIAGNOSIS — R42 Dizziness and giddiness: Secondary | ICD-10-CM | POA: Insufficient documentation

## 2018-12-01 DIAGNOSIS — R51 Headache: Secondary | ICD-10-CM | POA: Diagnosis not present

## 2018-12-05 ENCOUNTER — Other Ambulatory Visit (HOSPITAL_COMMUNITY): Payer: Self-pay | Admitting: Neurology

## 2018-12-05 ENCOUNTER — Other Ambulatory Visit: Payer: Self-pay | Admitting: Neurology

## 2018-12-05 DIAGNOSIS — G44211 Episodic tension-type headache, intractable: Secondary | ICD-10-CM

## 2018-12-25 ENCOUNTER — Ambulatory Visit (HOSPITAL_COMMUNITY)
Admission: RE | Admit: 2018-12-25 | Discharge: 2018-12-25 | Disposition: A | Discharge status: Home / Self Care | Payer: Medicare Other | Source: Ambulatory Visit | Attending: Neurology | Admitting: Neurology

## 2018-12-25 ENCOUNTER — Other Ambulatory Visit: Payer: Self-pay

## 2018-12-25 DIAGNOSIS — G44211 Episodic tension-type headache, intractable: Secondary | ICD-10-CM

## 2018-12-29 DIAGNOSIS — D329 Benign neoplasm of meninges, unspecified: Secondary | ICD-10-CM | POA: Insufficient documentation

## 2018-12-31 ENCOUNTER — Other Ambulatory Visit: Payer: Self-pay | Admitting: Internal Medicine

## 2018-12-31 DIAGNOSIS — R911 Solitary pulmonary nodule: Secondary | ICD-10-CM

## 2019-01-01 ENCOUNTER — Other Ambulatory Visit: Payer: Self-pay | Admitting: Neurology

## 2019-01-01 DIAGNOSIS — G44211 Episodic tension-type headache, intractable: Secondary | ICD-10-CM

## 2019-01-01 DIAGNOSIS — G08 Intracranial and intraspinal phlebitis and thrombophlebitis: Secondary | ICD-10-CM

## 2019-01-01 DIAGNOSIS — M316 Other giant cell arteritis: Secondary | ICD-10-CM

## 2019-01-02 ENCOUNTER — Telehealth (HOSPITAL_COMMUNITY): Payer: Self-pay

## 2019-01-08 ENCOUNTER — Other Ambulatory Visit (HOSPITAL_COMMUNITY): Payer: Self-pay | Admitting: Neurology

## 2019-01-08 DIAGNOSIS — G08 Intracranial and intraspinal phlebitis and thrombophlebitis: Secondary | ICD-10-CM

## 2019-01-09 ENCOUNTER — Other Ambulatory Visit: Payer: Self-pay

## 2019-01-09 ENCOUNTER — Ambulatory Visit
Admission: RE | Admit: 2019-01-09 | Discharge: 2019-01-09 | Disposition: A | Discharge status: Home / Self Care | Payer: Medicare Other | Source: Ambulatory Visit | Attending: Internal Medicine | Admitting: Internal Medicine

## 2019-01-09 DIAGNOSIS — R911 Solitary pulmonary nodule: Secondary | ICD-10-CM | POA: Diagnosis present

## 2019-02-24 ENCOUNTER — Ambulatory Visit: Payer: Medicare Other | Admitting: Physical Therapy

## 2019-02-27 ENCOUNTER — Ambulatory Visit: Payer: Medicare Other | Attending: Physician Assistant | Admitting: Physical Therapy

## 2019-02-27 ENCOUNTER — Encounter: Payer: Self-pay | Admitting: Physical Therapy

## 2019-02-27 ENCOUNTER — Other Ambulatory Visit: Payer: Self-pay

## 2019-02-27 DIAGNOSIS — H8111 Benign paroxysmal vertigo, right ear: Secondary | ICD-10-CM | POA: Diagnosis present

## 2019-02-27 DIAGNOSIS — M6281 Muscle weakness (generalized): Secondary | ICD-10-CM | POA: Diagnosis present

## 2019-02-27 DIAGNOSIS — R42 Dizziness and giddiness: Secondary | ICD-10-CM | POA: Diagnosis present

## 2019-02-27 DIAGNOSIS — R262 Difficulty in walking, not elsewhere classified: Secondary | ICD-10-CM | POA: Diagnosis present

## 2019-02-27 NOTE — Therapy (Signed)
[facility] [address] [place], [place], [address] Phone: [phone]   Fax:  [phone]  Physical Therapy Evaluation  Patient Details  Name: Charlotte Glover MRN: [id number] Date of Birth: May 03, 1928 Referring Provider (PT): Brett Fairy PA-C   Encounter Date: 02/27/2019  PT End of Session - 02/27/19 1305    Visit Number  1    Number of Visits  9    Date for PT Re-Evaluation  05/01/19    Authorization Type  1/10 progress notes    PT Start Time  0854    PT Stop Time  0949    PT Time Calculation (min)  55 min    Activity Tolerance  Patient tolerated treatment well       Past Medical History:  Diagnosis Date  . Arthritis   . Atrial fibrillation ([place])   . Breast cancer ([place]) 2015   DCIS  . Cancer ([place])   . Cardiac arrest ([place])   . Chickenpox   . Coronary artery disease   . Dyspnea   . Dysrhythmia   . Edema   . GERD (gastroesophageal reflux disease)   . Hearing aid worn    bilateral  . History of MI (myocardial infarction)   . HOH (hard of hearing)   . Myocardial infarction ([place]) 01/01/2013  . S/P ablation of atrial fibrillation   . Shingles     Past Surgical History:  Procedure Laterality Date  . ANTERIOR VITRECTOMY Left 03/20/2017   Procedure: ANTERIOR VITRECTOMY;  Surgeon: Leandrew Koyanagi, MD;  Location: [place];  Service: Ophthalmology;  Laterality: Left;  IVA TOPICAL LEFT  . ATRIAL ABLATION SURGERY    . BREAST BIOPSY Right 2015   + for DCIS   . BREAST SURGERY    . CARDIAC CATHETERIZATION    . CARDIAC CATHETERIZATION Bilateral 05/08/2016   Procedure: Right/Left Heart Cath and Coronary Angiography;  Surgeon: Isaias Cowman, MD;  Location: [place] [facility];  Service: Cardiovascular;  Laterality: Bilateral;  . CATARACT EXTRACTION W/PHACO Left 03/20/2017   Procedure: IOL EXCHANGE;  Surgeon: Leandrew Koyanagi, MD;  Location: [place];  Service: Ophthalmology;   Laterality: Left;  . CHOLECYSTECTOMY    . DILATION AND CURETTAGE OF UTERUS    . ESOPHAGOGASTRODUODENOSCOPY    . PACEMAKER INSERTION    . PERIPHERAL IRIDOTOMY Left 03/20/2017   Procedure: PERIPHERAL IRIDECTOMY;  Surgeon: Leandrew Koyanagi, MD;  Location: [place];  Service: Ophthalmology;  Laterality: Left;  . ROTATOR CUFF REPAIR Left   . TEE WITHOUT CARDIOVERSION N/A 05/16/2016   Procedure: TRANSESOPHAGEAL ECHOCARDIOGRAM (TEE);  Surgeon: Teodoro Spray, MD;  Location: [facility];  Service: Cardiovascular;  Laterality: N/A;  . TONSILLECTOMY    . uterine polyp      There were no vitals filed for this visit.   Subjective Assessment - 02/27/19 1303    Subjective  Patient states that she gets dizziness several times a day and that it usually occurs if she rolls over or turns.    Pertinent History  Patient reports she started having dizziness about a month ago when she would turn over in the morning. Patient reports she has dizziness and vertigo. Patient reports she was seen by ENT services and that she had the Epley maneuver performed 3 times at the ENT office. Patient reports her symptoms got better for a while and now have returned. Patient states she thought she was all better, but states she one night recently she moved and  experienced vertigo again. Patient she is cautious when she gets up in the morning and tries not to turn her head. Patient reports she has a fear of falling. Patient reports she has a bad right knee and that is why she uses the [facility]. Patient reports she has poor balance. Patient reports she has veered when she walks for a long time. Patient reports sometimes she will get dizziness when she is sitting still, but states when that occurs it is not as bad as when she is moving. Patient reports she typically has episodes of sneezing fits where she will sneeze seventeen to twenty times in a row. Patient describes her dizziness as vertigo, unsteadiness, lightheadedness a few  times and aural fullness. Patient reports that she experiences dizziness a few times a day and that the episodes last seconds. Patient's symptoms are provoked by bending over, rolling over and turning her head. Patient reports nothing makes her symptoms better. Patient reports she has not had difficulty with dizziness prior to one month age.    Patient Stated Goals  to have decreased dizziness and vertigo and improved balance         [facility] PT Assessment - 02/27/19 0001      Assessment   Medical Diagnosis  dizziness and giddiness    Referring Provider (PT)  Brett Fairy PA-C    Prior Therapy  no prior vestibular therapy; reports had Epley manuever performed at ENT office      Precautions   Precautions  Fall      Restrictions   Weight Bearing Restrictions  No      Balance Screen   Has the patient fallen in the past 6 months  No    Has the patient had a decrease in activity level because of a fear of falling?   Yes    Is the patient reluctant to leave their home because of a fear of falling?   No      Home Film/video editor residence    Living Arrangements  Spouse/significant other    Available Help at Discharge  Family    Type of Home  Other(Comment)   [place] Access  Level entry    [place] - single point      Prior Function   Level of Independence  Independent with community mobility with device      Cognition   Overall Cognitive Status  Within Functional Limits for tasks assessed      Standardized Balance Assessment   Standardized Balance Assessment  --       VESTIBULAR AND BALANCE EVALUATION   HISTORY:  Subjective history of current problem: Patient reports she started having dizziness about a month ago when she would turn over in the morning. Patient reports she has dizziness and vertigo. Patient reports she was seen by ENT services and that she had the Epley maneuver performed 3 times at  the ENT office. Patient reports her symptoms got better for a while and now have returned. Patient states she thought she was all better, but states she one night recently she moved and experienced vertigo again. Patient she is cautious when she gets up in the morning and tries not to turn her head. Patient reports she has a fear of falling. Patient reports she has a bad right knee and that is why she uses the [facility]. Patient reports she has poor balance.  Patient reports she has veered when she walks for a long time. Patient reports sometimes she will get dizziness when she is sitting still, but states when that occurs it is not as bad as when she is moving. Patient reports she typically has episodes of sneezing fits where she will sneeze seventeen to twenty times in a row. Patient describes her dizziness as vertigo, unsteadiness, lightheadedness a few times and aural fullness. Patient reports that she experiences dizziness a few times a day and that the episodes last seconds. Patient's symptoms are provoked by bending over, rolling over and turning her head. Patient reports nothing makes her symptoms better. Patient reports she has not had difficulty with dizziness prior to one month age.   Description of dizziness: vertigo, unsteadiness, lightheadedness a few times, aural fullness Frequency: a few times a day Duration: seconds Symptom nature: motion provoked, positional, variable, intermittent   Provocative Factors: bending over, rolling over, turning head Easing Factors: nothing  Progression of symptoms: better History of similar episodes: no  Falls (yes/no): no but reports she is afraid of falling Number of falls in past 6 months: 0  Auditory complaints (tinnitus, pain, drainage): tinnitus states she is not sure how long this has been going on and is not able to say if it is both ears  Vision (last eye exam, diplopia, recent changes): wears glasses; reports she just got a new  prescription  Current Symptoms: (dysarthria, drop attacks, bowel and bladder changes, recent weight loss/gain)    Review of systems negative for red flags.  Patient reports she has twisted esophagus and has spasms.   EXAMINATION    MUSCULOSKELETAL SCREEN: Cervical Spine ROM: AROM grossly WFL cervical flexion, rotation left and right and extension. Right and left rotation was grossly 0-45 degrees.    Functional Mobility: independent with transfers and bed mobility skills  Gait: Patient arrives ambulating with SPC. Patient ambulates with slower cadence with fair scanning of visual environment with step through gait pattern.   Balance: Patient is challenged by ambulating with head turns.    OCULOMOTOR / VESTIBULAR TESTING: Deferred this date secondary to patient with positive bilateral Dix-Hallpike tests   BPPV TESTS:  Symptoms Duration Intensity Nystagmus  Left Dix-Hallpike Mild vertigo About 10 sec "mild" Left beating upward torsional nystagmus  Right Dix-Hallpike vertigo About 30 sec 5/10 Right beating upward torsional nystagmus    FUNCTIONAL OUTCOME MEASURES: Will plan on performing DHI, ABC and DGI next session.   Canalith Repositioning Manuever: On mat table, performed right Dix-Hallpike testing and it was positive with right upbeating torsional nystagmus of short duration. Performed right canalith repositioning maneuver (CRT). Repeated R CRT for a total of 3 maneuvers with retesting between maneuvers. Patient reported 5/10 vertigo with first maneuver and reports decreased dizziness with each subsequent maneuver.     PT Education - 02/27/19 1304    Education Details  discussed BPPV and Epley maneuver    Person(s) Educated  Patient    Methods  Explanation;Verbal cues    Comprehension  Verbalized understanding       PT Short Term Goals - 02/27/19 1309      PT SHORT TERM GOAL #1   Title  Patient will report 50% or greater improvement in her symptoms of dizziness and  imbalance with provoking motions or positions.    Time  4    Period  Weeks    Status  New    Target Date  03/27/19        PT  Long Term Goals - 02/27/19 1310      PT LONG TERM GOAL #1   Title  Patient will report 75% or greater improvement in her symptoms of dizziness and imbalance with provoking motions or positions    Time  8    Period  Weeks    Status  New    Target Date  04/24/19      PT LONG TERM GOAL #2   Title  Patient will reduce falls risk as indicated by Activities Specific Balance Confidence Scale (ABC) >67%.    Time  8    Period  Weeks    Status  New    Target Date  04/24/19      PT LONG TERM GOAL #3   Title  Patient will demonstrate reduced falls risk as evidenced by Dynamic Gait Index (DGI) >19/24.    Time  8    Period  Weeks    Status  New    Target Date  04/24/19      PT LONG TERM GOAL #4   Title  Patient reports no vertigo with provoking motions or positions.    Time  8    Period  Weeks    Status  New    Target Date  04/24/19             Plan - 02/27/19 1306    Clinical Impression Statement  Patient presents to the clinic with reports of episodes of vertigo several times a day that lasts seconds and are brought on by turning her head, rolling over in bed and bending over. Patient with positive right and left Dix-Hallpike tests which nystagmus noted and patient reporting vertigo which could be indicative of bilateral BPPV. The right ear provoked patient's symptoms more therefore performed 3 right canalith repositioning maneuvers this date which were well tolerated by the patient. Deferred further vestibular testing this date and will plan to assess at follow-up sessions as well as plan to further assess balance and functional outcome measures. Patient would benefit from PT services to address functional deficits and goals as set on plan of care.    Personal Factors and Comorbidities  Age;Comorbidity 3+    Comorbidities  MI, CAD, arthritis     Examination-Activity Limitations  Bend    Stability/Clinical Decision Making  Evolving/Moderate complexity    Clinical Decision Making  Moderate    Rehab Potential  Good    PT Frequency  1x / week    PT Duration  8 weeks    PT Treatment/Interventions  Canalith Repostioning;ADLs/Self Care Home Management;Gait training;Stair training;Therapeutic activities;Therapeutic exercise;Balance training;Neuromuscular re-education;Patient/family education;Vestibular    PT Next Visit Plan  repeat Dix-Hallpike and Epley maneuver as needed    Consulted and Agree with Plan of Care  Patient       Patient will benefit from skilled therapeutic intervention in order to improve the following deficits and impairments:  Decreased balance, Difficulty walking, Dizziness, Decreased strength  Visit Diagnosis: Dizziness and giddiness  BPPV (benign paroxysmal positional vertigo), right     Problem List Patient Active Problem List   Diagnosis Date Noted  . Cardiac pacemaker 05/18/2014  . Atrial fibrillation ([place]) 11/27/2011  . Coronary artery disease 11/27/2011   Lady Deutscher PT, DPT [phone] Lady Deutscher 02/27/2019, 1:27 PM  [place] [facility] [address] [place], [place], [address] Phone: [phone]   Fax:  [phone]  Name: Vear Mcaulay MRN: [id number] Date of Birth: [birth year]

## 2019-03-06 ENCOUNTER — Ambulatory Visit: Payer: Medicare Other | Admitting: Physical Therapy

## 2019-03-06 ENCOUNTER — Other Ambulatory Visit: Payer: Self-pay

## 2019-03-06 ENCOUNTER — Encounter: Payer: Self-pay | Admitting: Physical Therapy

## 2019-03-06 DIAGNOSIS — R42 Dizziness and giddiness: Secondary | ICD-10-CM | POA: Diagnosis not present

## 2019-03-06 DIAGNOSIS — H8111 Benign paroxysmal vertigo, right ear: Secondary | ICD-10-CM

## 2019-03-06 NOTE — Therapy (Signed)
[facility] [address] [place], [place], [address] Phone: [phone]   Fax:  [phone]  Physical Therapy Treatment  Patient Details  Name: Charlotte Glover MRN: [id number] Date of Birth: [birth year] Referring Provider (PT): Brett Fairy PA-C   Encounter Date: 03/06/2019  PT End of Session - 03/06/19 0946    Visit Number  2    Number of Visits  9    Date for PT Re-Evaluation  05/01/19    Authorization Type  2/10 progress notes    PT Start Time  0855    PT Stop Time  0934    PT Time Calculation (min)  39 min    Activity Tolerance  Patient tolerated treatment well    Behavior During Therapy  [facility] for tasks assessed/performed       Past Medical History:  Diagnosis Date  . Arthritis   . Atrial fibrillation ([place])   . Breast cancer ([place]) 2015   DCIS  . Cancer ([place])   . Cardiac arrest ([place])   . Chickenpox   . Coronary artery disease   . Dyspnea   . Dysrhythmia   . Edema   . GERD (gastroesophageal reflux disease)   . Hearing aid worn    bilateral  . History of MI (myocardial infarction)   . HOH (hard of hearing)   . Myocardial infarction ([place]) 01/01/2013  . S/P ablation of atrial fibrillation   . Shingles     Past Surgical History:  Procedure Laterality Date  . ANTERIOR VITRECTOMY Left 03/20/2017   Procedure: ANTERIOR VITRECTOMY;  Surgeon: Leandrew Koyanagi, MD;  Location: [place];  Service: Ophthalmology;  Laterality: Left;  IVA TOPICAL LEFT  . ATRIAL ABLATION SURGERY    . BREAST BIOPSY Right 2015   + for DCIS   . BREAST SURGERY    . CARDIAC CATHETERIZATION    . CARDIAC CATHETERIZATION Bilateral 05/08/2016   Procedure: Right/Left Heart Cath and Coronary Angiography;  Surgeon: Isaias Cowman, MD;  Location: [place] [facility];  Service: Cardiovascular;  Laterality: Bilateral;  . CATARACT EXTRACTION W/PHACO Left 03/20/2017   Procedure: IOL EXCHANGE;  Surgeon: Leandrew Koyanagi, MD;   Location: [place];  Service: Ophthalmology;  Laterality: Left;  . CHOLECYSTECTOMY    . DILATION AND CURETTAGE OF UTERUS    . ESOPHAGOGASTRODUODENOSCOPY    . PACEMAKER INSERTION    . PERIPHERAL IRIDOTOMY Left 03/20/2017   Procedure: PERIPHERAL IRIDECTOMY;  Surgeon: Leandrew Koyanagi, MD;  Location: [place];  Service: Ophthalmology;  Laterality: Left;  . ROTATOR CUFF REPAIR Left   . TEE WITHOUT CARDIOVERSION N/A 05/16/2016   Procedure: TRANSESOPHAGEAL ECHOCARDIOGRAM (TEE);  Surgeon: Teodoro Spray, MD;  Location: [facility];  Service: Cardiovascular;  Laterality: N/A;  . TONSILLECTOMY    . uterine polyp      There were no vitals filed for this visit.  Subjective Assessment - 03/06/19 1227    Subjective  Patient states she is doing good but that she continues to have vertigo.    Pertinent History  Patient reports she started having dizziness about a month ago when she would turn over in the morning. Patient reports she has dizziness and vertigo. Patient reports she was seen by ENT services and that she had the Epley maneuver performed 3 times at the ENT office. Patient reports her symptoms got better for a while and now have returned. Patient states she thought she was all better, but states she one night recently she  moved and experienced vertigo again. Patient she is cautious when she gets up in the morning and tries not to turn her head. Patient reports she has a fear of falling. Patient reports she has a bad right knee and that is why she uses the [facility]. Patient reports she has poor balance. Patient reports she has veered when she walks for a long time. Patient reports sometimes she will get dizziness when she is sitting still, but states when that occurs it is not as bad as when she is moving. Patient reports she typically has episodes of sneezing fits where she will sneeze seventeen to twenty times in a row. Patient describes her dizziness as vertigo, unsteadiness,  lightheadedness a few times and aural fullness. Patient reports that she experiences dizziness a few times a day and that the episodes last seconds. Patient's symptoms are provoked by bending over, rolling over and turning her head. Patient reports nothing makes her symptoms better. Patient reports she has not had difficulty with dizziness prior to one month age.    Patient Stated Goals  to have decreased dizziness and vertigo and improved balance        Canalith Repositioning Manuever: On mat table, performed right Dix-Hallpike testing and it was positive with right upbeating torsional nystagmus of short duration without latency.  Performed right canalith repositioning maneuver (CRT). Repeated right CRT for a total of 3 maneuvers with retesting between maneuvers. Patient reports 5/10 vertigo with first maneuver, 3/10 with second and 0/10 with third maneuver.  When patient rolled over onto the left side-lying position during the CRT, patient reported a second bout of vertigo and noted ageotrophic nystagmus.  Patient reports that she felt better and was not having dizziness at the end of the session.    PT Education - 03/06/19 1227    Education Details  discussed plan of care and canalith repositioning manuever    Person(s) Educated  Patient    Methods  Explanation    Comprehension  Verbalized understanding       PT Short Term Goals - 02/27/19 1309      PT SHORT TERM GOAL #1   Title  Patient will report 50% or greater improvement in her symptoms of dizziness and imbalance with provoking motions or positions.    Time  4    Period  Weeks    Status  New    Target Date  03/27/19        PT Long Term Goals - 02/27/19 1310      PT LONG TERM GOAL #1   Title  Patient will report 75% or greater improvement in her symptoms of dizziness and imbalance with provoking motions or positions    Time  8    Period  Weeks    Status  New    Target Date  04/24/19      PT LONG TERM GOAL #2   Title   Patient will reduce falls risk as indicated by Activities Specific Balance Confidence Scale (ABC) >67%.    Time  8    Period  Weeks    Status  New    Target Date  04/24/19      PT LONG TERM GOAL #3   Title  Patient will demonstrate reduced falls risk as evidenced by Dynamic Gait Index (DGI) >19/24.    Time  8    Period  Weeks    Status  New    Target Date  04/24/19  PT LONG TERM GOAL #4   Title  Patient reports no vertigo with provoking motions or positions.    Time  8    Period  Weeks    Status  New    Target Date  04/24/19            Plan - 03/06/19 1228    Clinical Impression Statement  Patient with positive right Dix-Hallpike test and performed canalith repositioning manuever. Patient again noted to have a second bout of nystagmus and vertigo during the canalith repositioning manuever when patient when patient moved into left sidelying. Will plan on treating right ear until cleared and then will test left ear. Patient would benefit from continued PT services to address goals and functional deficits.    Personal Factors and Comorbidities  Age;Comorbidity 3+    Comorbidities  MI, CAD, arthritis    Examination-Activity Limitations  Bend    Stability/Clinical Decision Making  Evolving/Moderate complexity    Rehab Potential  Good    PT Frequency  1x / week    PT Duration  8 weeks    PT Treatment/Interventions  Canalith Repostioning;ADLs/Self Care Home Management;Gait training;Stair training;Therapeutic activities;Therapeutic exercise;Balance training;Neuromuscular re-education;Patient/family education;Vestibular    PT Next Visit Plan  repeat Dix-Hallpike and Epley maneuver as needed    Consulted and Agree with Plan of Care  Patient       Patient will benefit from skilled therapeutic intervention in order to improve the following deficits and impairments:  Decreased balance, Difficulty walking, Dizziness, Decreased strength  Visit Diagnosis: Dizziness and  giddiness  BPPV (benign paroxysmal positional vertigo), right     Problem List Patient Active Problem List   Diagnosis Date Noted  . Cardiac pacemaker 05/18/2014  . Atrial fibrillation ([place]) 11/27/2011  . Coronary artery disease 11/27/2011   Lady Deutscher PT, DPT [phone] Lady Deutscher 03/06/2019, 12:33 PM  [facility] [address] [place], [place], [address] Phone: [phone]   Fax:  [phone]  Name: Paulena Kiang MRN: [id number] Date of Birth: 1928-02-19

## 2019-03-10 ENCOUNTER — Ambulatory Visit: Payer: Medicare Other | Admitting: Physical Therapy

## 2019-03-10 ENCOUNTER — Other Ambulatory Visit: Payer: Self-pay

## 2019-03-10 ENCOUNTER — Encounter: Payer: Self-pay | Admitting: Physical Therapy

## 2019-03-10 DIAGNOSIS — R42 Dizziness and giddiness: Secondary | ICD-10-CM | POA: Diagnosis not present

## 2019-03-10 DIAGNOSIS — H8111 Benign paroxysmal vertigo, right ear: Secondary | ICD-10-CM

## 2019-03-10 NOTE — Therapy (Signed)
[facility] [address]. [place], [place], [address] Phone: [phone]   Fax:  [phone]  Physical Therapy Treatment  Patient Details  Name: Charlotte Glover MRN: [id number] Date of Birth: April 12, 1928 Referring Provider (PT): Brett Fairy PA-C   Encounter Date: 03/10/2019  PT End of Session - 03/10/19 1740    Visit Number  3    Number of Visits  9    Date for PT Re-Evaluation  05/01/19    Authorization Type  2/10 progress notes    PT Start Time  1536    PT Stop Time  1614    PT Time Calculation (min)  38 min    Activity Tolerance  Patient tolerated treatment well    Behavior During Therapy  [facility] for tasks assessed/performed       Past Medical History:  Diagnosis Date  . Arthritis   . Atrial fibrillation ([place])   . Breast cancer ([place]) 2015   DCIS  . Cancer ([place])   . Cardiac arrest ([place])   . Chickenpox   . Coronary artery disease   . Dyspnea   . Dysrhythmia   . Edema   . GERD (gastroesophageal reflux disease)   . Hearing aid worn    bilateral  . History of MI (myocardial infarction)   . HOH (hard of hearing)   . Myocardial infarction ([place]) 01/01/2013  . S/P ablation of atrial fibrillation   . Shingles     Past Surgical History:  Procedure Laterality Date  . ANTERIOR VITRECTOMY Left 03/20/2017   Procedure: ANTERIOR VITRECTOMY;  Surgeon: Leandrew Koyanagi, MD;  Location: [place];  Service: Ophthalmology;  Laterality: Left;  IVA TOPICAL LEFT  . ATRIAL ABLATION SURGERY    . BREAST BIOPSY Right 2015   + for DCIS   . BREAST SURGERY    . CARDIAC CATHETERIZATION    . CARDIAC CATHETERIZATION Bilateral 05/08/2016   Procedure: Right/Left Heart Cath and Coronary Angiography;  Surgeon: Isaias Cowman, MD;  Location: [place] [facility];  Service: Cardiovascular;  Laterality: Bilateral;  . CATARACT EXTRACTION W/PHACO Left 03/20/2017   Procedure: IOL EXCHANGE;  Surgeon: Leandrew Koyanagi, MD;   Location: [place];  Service: Ophthalmology;  Laterality: Left;  . CHOLECYSTECTOMY    . DILATION AND CURETTAGE OF UTERUS    . ESOPHAGOGASTRODUODENOSCOPY    . PACEMAKER INSERTION    . PERIPHERAL IRIDOTOMY Left 03/20/2017   Procedure: PERIPHERAL IRIDECTOMY;  Surgeon: Leandrew Koyanagi, MD;  Location: [place];  Service: Ophthalmology;  Laterality: Left;  . ROTATOR CUFF REPAIR Left   . TEE WITHOUT CARDIOVERSION N/A 05/16/2016   Procedure: TRANSESOPHAGEAL ECHOCARDIOGRAM (TEE);  Surgeon: Teodoro Spray, MD;  Location: [facility];  Service: Cardiovascular;  Laterality: N/A;  . TONSILLECTOMY    . uterine polyp      There were no vitals filed for this visit.  Subjective Assessment - 03/10/19 1739    Subjective  Patient reports that she has had a few episodes of vertigo when she is in bed and turns her head.    Pertinent History  Patient reports she started having dizziness about a month ago when she would turn over in the morning. Patient reports she has dizziness and vertigo. Patient reports she was seen by ENT services and that she had the Epley maneuver performed 3 times at the ENT office. Patient reports her symptoms got better for a while and now have returned. Patient states she thought she was all better,  but states she one night recently she moved and experienced vertigo again. Patient she is cautious when she gets up in the morning and tries not to turn her head. Patient reports she has a fear of falling. Patient reports she has a bad right knee and that is why she uses the [facility]. Patient reports she has poor balance. Patient reports she has veered when she walks for a long time. Patient reports sometimes she will get dizziness when she is sitting still, but states when that occurs it is not as bad as when she is moving. Patient reports she typically has episodes of sneezing fits where she will sneeze seventeen to twenty times in a row. Patient describes her dizziness as  vertigo, unsteadiness, lightheadedness a few times and aural fullness. Patient reports that she experiences dizziness a few times a day and that the episodes last seconds. Patient's symptoms are provoked by bending over, rolling over and turning her head. Patient reports nothing makes her symptoms better. Patient reports she has not had difficulty with dizziness prior to one month age.    Patient Stated Goals  to have decreased dizziness and vertigo and improved balance        Canalith Repositioning Manuever: On mat table, performed left Dix-Hallpike testing and it was positive with left upbeating torsional nystagmus of short duration without latency. Performed left canalith repositioning maneuver (CRT). Repeated left CRT for a total of 3 maneuvers with retesting between maneuvers. Held each position of the CRT maneuvers for 1.5 to 2 minutes this date.  Patient reported <5/10 vertigo with first and denied vertigo with the 2nd and 3rd last maneuvers.  On mat table, performed right Dix-Hallpike testing and it was negative both times with no nystagmus observed and patient denied vertigo. However, during the CRT maneuver when patient rolled her head from the left to the right side on the first maneuver, patient experienced vertigo and noted right torsional up-beating nystagmus of short duration. This did not occur with the last two CRT maneuvers.      PT Education - 03/10/19 1740    Education Details  discussed plan of care    Person(s) Educated  Patient    Methods  Explanation    Comprehension  Verbalized understanding       PT Short Term Goals - 02/27/19 1309      PT SHORT TERM GOAL #1   Title  Patient will report 50% or greater improvement in her symptoms of dizziness and imbalance with provoking motions or positions.    Time  4    Period  Weeks    Status  New    Target Date  03/27/19        PT Long Term Goals - 02/27/19 1310      PT LONG TERM GOAL #1   Title  Patient will report  75% or greater improvement in her symptoms of dizziness and imbalance with provoking motions or positions    Time  8    Period  Weeks    Status  New    Target Date  04/24/19      PT LONG TERM GOAL #2   Title  Patient will reduce falls risk as indicated by Activities Specific Balance Confidence Scale (ABC) >67%.    Time  8    Period  Weeks    Status  New    Target Date  04/24/19      PT LONG TERM GOAL #3   Title  Patient  will demonstrate reduced falls risk as evidenced by Dynamic Gait Index (DGI) >19/24.    Time  8    Period  Weeks    Status  New    Target Date  04/24/19      PT LONG TERM GOAL #4   Title  Patient reports no vertigo with provoking motions or positions.    Time  8    Period  Weeks    Status  New    Target Date  04/24/19            Plan - 03/10/19 1741    Clinical Impression Statement  Patient reports improvement in her symptoms but states she did have a few episodes of vertigo when turning her head in bed since last visit. Patient with negative Right Dix-Hallpike test this date. Patient with positive left Dix-Hallpike test and performed 3 canalith repositioning maneuvers. Held each position during the maneuvers for 1.5 to 2 minutes each to try to assist in clearance of particles. Will consider inclining table next visit if patient continues to have complaints of vertigo and positive Dix-Hallpike tests.    Personal Factors and Comorbidities  Age;Comorbidity 3+    Comorbidities  MI, CAD, arthritis    Examination-Activity Limitations  Bend    Stability/Clinical Decision Making  Evolving/Moderate complexity    Rehab Potential  Good    PT Frequency  1x / week    PT Duration  8 weeks    PT Treatment/Interventions  Canalith Repostioning;ADLs/Self Care Home Management;Gait training;Stair training;Therapeutic activities;Therapeutic exercise;Balance training;Neuromuscular re-education;Patient/family education;Vestibular    PT Next Visit Plan  repeat Dix-Hallpike and  Epley maneuver as needed    Consulted and Agree with Plan of Care  Patient       Patient will benefit from skilled therapeutic intervention in order to improve the following deficits and impairments:  Decreased balance, Difficulty walking, Dizziness, Decreased strength  Visit Diagnosis: Dizziness and giddiness  BPPV (benign paroxysmal positional vertigo), right     Problem List Patient Active Problem List   Diagnosis Date Noted  . Cardiac pacemaker 05/18/2014  . Atrial fibrillation ([place]) 11/27/2011  . Coronary artery disease 11/27/2011   Lady Deutscher PT, DPT [phone] Lady Deutscher 03/10/2019, 5:49 PM  [facility] [address] [place], [place], [address] Phone: [phone]   Fax:  [phone]  Name: Charlotte Glover MRN: [id number] Date of Birth: [birth year]

## 2019-03-19 ENCOUNTER — Encounter: Payer: Medicare Other | Admitting: Physical Therapy

## 2019-03-23 ENCOUNTER — Other Ambulatory Visit: Payer: Self-pay

## 2019-03-23 ENCOUNTER — Encounter: Payer: Self-pay | Admitting: Physical Therapy

## 2019-03-23 ENCOUNTER — Ambulatory Visit: Payer: Medicare Other | Admitting: Physical Therapy

## 2019-03-23 DIAGNOSIS — R42 Dizziness and giddiness: Secondary | ICD-10-CM

## 2019-03-23 DIAGNOSIS — R262 Difficulty in walking, not elsewhere classified: Secondary | ICD-10-CM

## 2019-03-23 DIAGNOSIS — M6281 Muscle weakness (generalized): Secondary | ICD-10-CM

## 2019-03-23 NOTE — Therapy (Signed)
[facility] [address] [place], [place], [address] Phone: [phone]   Fax:  [phone]  Physical Therapy Treatment  Patient Details  Name: Charlotte Glover MRN: [id number] Date of Birth: 06-09-1928 Referring Provider (PT): Brett Fairy PA-C   Encounter Date: 03/23/2019  PT End of Session - 03/23/19 0903    Visit Number  4    Number of Visits  9    Date for PT Re-Evaluation  05/01/19    Authorization Type  4/10 progress notes    PT Start Time  0900    PT Stop Time  0931    PT Time Calculation (min)  31 min    Equipment Utilized During Treatment  Gait belt    Activity Tolerance  Patient tolerated treatment well    Behavior During Therapy  [facility] for tasks assessed/performed       Past Medical History:  Diagnosis Date  . Arthritis   . Atrial fibrillation ([place])   . Breast cancer ([place]) 2015   DCIS  . Cancer ([place])   . Cardiac arrest ([place])   . Chickenpox   . Coronary artery disease   . Dyspnea   . Dysrhythmia   . Edema   . GERD (gastroesophageal reflux disease)   . Hearing aid worn    bilateral  . History of MI (myocardial infarction)   . HOH (hard of hearing)   . Myocardial infarction ([place]) 01/01/2013  . S/P ablation of atrial fibrillation   . Shingles     Past Surgical History:  Procedure Laterality Date  . ANTERIOR VITRECTOMY Left 03/20/2017   Procedure: ANTERIOR VITRECTOMY;  Surgeon: Leandrew Koyanagi, MD;  Location: [place];  Service: Ophthalmology;  Laterality: Left;  IVA TOPICAL LEFT  . ATRIAL ABLATION SURGERY    . BREAST BIOPSY Right 2015   + for DCIS   . BREAST SURGERY    . CARDIAC CATHETERIZATION    . CARDIAC CATHETERIZATION Bilateral 05/08/2016   Procedure: Right/Left Heart Cath and Coronary Angiography;  Surgeon: Isaias Cowman, MD;  Location: [place] [facility];  Service: Cardiovascular;  Laterality: Bilateral;  . CATARACT EXTRACTION W/PHACO Left 03/20/2017   Procedure:  IOL EXCHANGE;  Surgeon: Leandrew Koyanagi, MD;  Location: [place];  Service: Ophthalmology;  Laterality: Left;  . CHOLECYSTECTOMY    . DILATION AND CURETTAGE OF UTERUS    . ESOPHAGOGASTRODUODENOSCOPY    . PACEMAKER INSERTION    . PERIPHERAL IRIDOTOMY Left 03/20/2017   Procedure: PERIPHERAL IRIDECTOMY;  Surgeon: Leandrew Koyanagi, MD;  Location: [place];  Service: Ophthalmology;  Laterality: Left;  . ROTATOR CUFF REPAIR Left   . TEE WITHOUT CARDIOVERSION N/A 05/16/2016   Procedure: TRANSESOPHAGEAL ECHOCARDIOGRAM (TEE);  Surgeon: Teodoro Spray, MD;  Location: [facility];  Service: Cardiovascular;  Laterality: N/A;  . TONSILLECTOMY    . uterine polyp      There were no vitals filed for this visit.  Subjective Assessment - 03/23/19 0900    Subjective  Patient states she is on an antibiotic for a sinus infection. Patient states she is feeling better now but that she missed last week becuase she was not feeling well. Patient reports she has not had any episodes of vertigo in the past 2 weeks. Patient states she was having bad headaches because of the sinus infection, but states the headaches have cleared.    Pertinent History  Patient reports she started having dizziness about a month ago when she would turn over  in the morning. Patient reports she has dizziness and vertigo. Patient reports she was seen by ENT services and that she had the Epley maneuver performed 3 times at the ENT office. Patient reports her symptoms got better for a while and now have returned. Patient states she thought she was all better, but states she one night recently she moved and experienced vertigo again. Patient she is cautious when she gets up in the morning and tries not to turn her head. Patient reports she has a fear of falling. Patient reports she has a bad right knee and that is why she uses the [facility]. Patient reports she has poor balance. Patient reports she has veered when she walks for a  long time. Patient reports sometimes she will get dizziness when she is sitting still, but states when that occurs it is not as bad as when she is moving. Patient reports she typically has episodes of sneezing fits where she will sneeze seventeen to twenty times in a row. Patient describes her dizziness as vertigo, unsteadiness, lightheadedness a few times and aural fullness. Patient reports that she experiences dizziness a few times a day and that the episodes last seconds. Patient's symptoms are provoked by bending over, rolling over and turning her head. Patient reports nothing makes her symptoms better. Patient reports she has not had difficulty with dizziness prior to one month age.    Patient Stated Goals  to have decreased dizziness and vertigo and improved balance       Neuromuscular Re-education:  Dix-Hallpike: Performed left and right Dix-Hallpike tests and both were negative with patient denying vertigo and no nystagmus observed.  Patient performed the DGI and the ABC scale. Patient reports that she has difficulty walking on uneven surfaces and on her inclined driveway. Patient reports that she uses the [facility] at times in the community because she is afraid that her right knee will "collapse". Patient states she has had episodes of her right knee buckling in the past. Patient reports she receives gel injections for her knee from the orthopedic physician. Patient with valgus right knee and Trendelenberg gait. Patient ambulates with SPC in the clinic.   FUNCTIONAL OUTCOME MEASURES:  Results Comments  ABC Scale 82.5% Low falls risk  DGI 17/24 Falls risk; in need of intervention    MMT:   Right Left  Hip flexors 3/5 +2/5  Hamstrings +3/5 +3/5  Quadriceps -5/5 -5/5  Dorsiflexors -5/5 -5/5     PT Education - 03/23/19 0903    Education Details  discussed progress towards goals and plan of care-patient's vertigo has resolved but she would like to now work on her balance and strength  deficits    Person(s) Educated  Patient    Methods  Explanation    Comprehension  Verbalized understanding       PT Short Term Goals - 03/23/19 0910      PT SHORT TERM GOAL #1   Title  Patient will report 50% or greater improvement in her symptoms of dizziness and imbalance with provoking motions or positions.    Baseline  100% on 03/23/19    Time  4    Period  Weeks    Status  Achieved    Target Date  03/27/19        PT Long Term Goals - 03/23/19 0912      PT LONG TERM GOAL #1   Title  Patient will report 75% or greater improvement in her symptoms of dizziness and imbalance  with provoking motions or positions    Baseline  Patient reports 100% improvement in her symptoms on 03/23/19    Time  8    Period  Weeks    Status  Achieved      PT LONG TERM GOAL #2   Title  Patient will reduce falls risk as indicated by Activities Specific Balance Confidence Scale (ABC) >67%.    Baseline  scored 82.5% on 03/23/19    Time  8    Period  Weeks    Status  Achieved      PT LONG TERM GOAL #3   Title  Patient will demonstrate reduced falls risk as evidenced by Dynamic Gait Index (DGI) >19/24.    Baseline  scored 17/24 on 03/23/19    Time  8    Period  Weeks    Status  On-going      PT LONG TERM GOAL #4   Title  Patient reports no vertigo with provoking motions or positions.    Time  8    Period  Weeks    Status  Achieved            Plan - 03/23/19 1232    Clinical Impression Statement  Patient with negative bilateral Dix-Hallpike tests this date and patient reports that she has not had any episodes of vertigo in the past two weeks. Patient has met 1/1 short term goals and 3/4 long term goals. Patient's vertigo has resolved but she does continue to have difficulty wtih balance and decreased strength. Patient reports that she has difficulty walking on uneven ground, difficulty walking on inclines such as her driveway and difficulty with sit to standing. Patient scored a 17/24 on the  DGI which indicates falls risk. Patient would benefit from continued PT services to further address functional deficits and to try to further reduce her falls risk.    Personal Factors and Comorbidities  Age;Comorbidity 3+    Comorbidities  MI, CAD, arthritis    Examination-Activity Limitations  Bend    Stability/Clinical Decision Making  Evolving/Moderate complexity    Rehab Potential  Good    PT Frequency  1x / week    PT Duration  8 weeks    PT Treatment/Interventions  Canalith Repostioning;ADLs/Self Care Home Management;Gait training;Stair training;Therapeutic activities;Therapeutic exercise;Balance training;Neuromuscular re-education;Patient/family education;Vestibular    Consulted and Agree with Plan of Care  Patient       Patient will benefit from skilled therapeutic intervention in order to improve the following deficits and impairments:  Decreased balance, Difficulty walking, Dizziness, Decreased strength  Visit Diagnosis: Dizziness and giddiness  Difficulty in walking, not elsewhere classified  Muscle weakness (generalized)     Problem List Patient Active Problem List   Diagnosis Date Noted  . Cardiac pacemaker 05/18/2014  . Atrial fibrillation ([place]) 11/27/2011  . Coronary artery disease 11/27/2011   Lady Deutscher PT, DPT [phone] Lady Deutscher 03/23/2019, 12:46 PM  [facility] [address] [place], [place], [address] Phone: [phone]   Fax:  [phone]  Name: Charlotte Glover MRN: [id number] Date of Birth: 10/26/1927

## 2019-03-31 ENCOUNTER — Ambulatory Visit: Payer: Medicare Other | Admitting: Physical Therapy

## 2019-04-01 ENCOUNTER — Ambulatory Visit: Payer: Medicare Other | Admitting: Physical Therapy

## 2019-04-07 ENCOUNTER — Other Ambulatory Visit (INDEPENDENT_AMBULATORY_CARE_PROVIDER_SITE_OTHER): Payer: Self-pay | Admitting: Nurse Practitioner

## 2019-04-07 ENCOUNTER — Other Ambulatory Visit
Admission: RE | Admit: 2019-04-07 | Discharge: 2019-04-07 | Disposition: A | Discharge status: Home / Self Care | Payer: Medicare Other | Source: Ambulatory Visit | Attending: Vascular Surgery | Admitting: Vascular Surgery

## 2019-04-07 ENCOUNTER — Encounter (INDEPENDENT_AMBULATORY_CARE_PROVIDER_SITE_OTHER): Payer: Self-pay | Admitting: Vascular Surgery

## 2019-04-07 ENCOUNTER — Other Ambulatory Visit: Payer: Self-pay

## 2019-04-07 ENCOUNTER — Telehealth (INDEPENDENT_AMBULATORY_CARE_PROVIDER_SITE_OTHER): Payer: Self-pay

## 2019-04-07 ENCOUNTER — Ambulatory Visit: Payer: Medicare Other | Attending: Physician Assistant | Admitting: Physical Therapy

## 2019-04-07 ENCOUNTER — Ambulatory Visit (INDEPENDENT_AMBULATORY_CARE_PROVIDER_SITE_OTHER): Payer: Medicare Other | Admitting: Vascular Surgery

## 2019-04-07 VITALS — BP 144/78 | HR 89 | Resp 16 | Ht 66.0 in | Wt 177.0 lb

## 2019-04-07 DIAGNOSIS — R2689 Other abnormalities of gait and mobility: Secondary | ICD-10-CM | POA: Diagnosis present

## 2019-04-07 DIAGNOSIS — R262 Difficulty in walking, not elsewhere classified: Secondary | ICD-10-CM | POA: Insufficient documentation

## 2019-04-07 DIAGNOSIS — M6281 Muscle weakness (generalized): Secondary | ICD-10-CM | POA: Diagnosis present

## 2019-04-07 DIAGNOSIS — Z20828 Contact with and (suspected) exposure to other viral communicable diseases: Secondary | ICD-10-CM | POA: Diagnosis not present

## 2019-04-07 DIAGNOSIS — I4891 Unspecified atrial fibrillation: Secondary | ICD-10-CM | POA: Diagnosis not present

## 2019-04-07 DIAGNOSIS — I2510 Atherosclerotic heart disease of native coronary artery without angina pectoris: Secondary | ICD-10-CM

## 2019-04-07 DIAGNOSIS — M316 Other giant cell arteritis: Secondary | ICD-10-CM | POA: Insufficient documentation

## 2019-04-07 DIAGNOSIS — Z01812 Encounter for preprocedural laboratory examination: Secondary | ICD-10-CM | POA: Insufficient documentation

## 2019-04-07 LAB — SARS CORONAVIRUS 2 (TAT 6-24 HRS): SARS Coronavirus 2: NEGATIVE

## 2019-04-07 NOTE — Telephone Encounter (Signed)
Patient returned the call and was given the information regarding her pre-op at the [facility] on 04/08/2019. Patient will need to be at the [facility] at 10:00 am for pre-op, patient was asked to not eat or drink anything after 12:00 pm midnight tonight and to not take her Xarelto. Patient stated she understood.

## 2019-04-07 NOTE — Assessment & Plan Note (Signed)
Rate controlled.  On anticoagulation.  Will hold today for procedure this week.

## 2019-04-07 NOTE — Telephone Encounter (Signed)
A message was left for the patient to be at the [facility] on 04/08/2019 at 10:00 am to do her pre-op before her surgery with Dr. Lucky Cowboy. Patient was also advised to not eat or drink anything after 12:00 pm tonight.

## 2019-04-07 NOTE — Assessment & Plan Note (Signed)
The patient has clinical parameters consistent with temporal arteritis.  She is currently on steroids and a formal diagnosis would be helpful in terms of dosing and duration of her steroids.  She has had a previous temporal artery biopsy in the past on the left which was negative, but her clinical concern remains quite high.  For this reason, a right temporal artery biopsy should be performed at this time.  I discussed the risks and benefits the procedure.  I would do this in the hospital either with general anesthesia or sedation.  I have discussed the typical recovery.  The results usually take 1 to 2 days to return if not longer.  She voices her understanding and is agreeable to proceed.  We will try to get this scheduled for this week.

## 2019-04-07 NOTE — Patient Instructions (Signed)
Temporal Artery Biopsy  Arteries are blood vessels that carry blood from the heart to the rest of the body. Temporal arteries are found on the side of the head and between the ears and eyes. During a temporal artery biopsy, a sample of the temporal artery is removed and examined under a microscope. This procedure may be done to see if you have a condition that causes your arteries to become swollen or inflamed (temporal arteritis or giant cell arteritis). Tell a health care provider about:  Any allergies you have.  All medicines you are taking, including vitamins, herbs, eye drops, creams, and over-the-counter medicines.  Any problems you or family members have had with anesthetic medicines.  Any blood disorders you have.  Any surgeries you have had.  Any medical conditions you have or have had.  Whether you are pregnant or may be pregnant. What are the risks? Generally, this is a safe procedure. However, problems may occur, including:  Bleeding.  A collection of blood under the skin (hematoma).  Infection.  Nerve damage in the temples. This can cause numbness or make the muscles in your face weak.  Scarring. On the scalp, hair may not grow around the scar. What happens before the procedure?  You may have blood tests to make sure that your blood clots normally.  Ask your health care provider about: ? Changing or stopping your regular medicines. This is especially important if you are taking diabetes medicines or blood thinners. ? Taking medicines such as aspirin and ibuprofen. These medicines can thin your blood. Do not take these medicines unless your health care provider tells you to take them. ? Taking over-the-counter medicines, vitamins, herbs, and supplements.  Follow instructions from your health care provider about eating or drinking restrictions.  Plan to have someone take you home from the hospital or clinic.  If you will be going home right after the procedure,  plan to have someone with you for 24 hours.  Ask your health care provider: ? How your surgery site will be marked. ? What steps will be taken to help prevent infection. These may include:  Removing hair at the surgery site.  Washing skin with a germ-killing soap.  Taking antibiotic medicine. What happens during the procedure?  Small monitors will be put on your body. They will be used to check your heart, blood pressure, and oxygen level.  An IV will be inserted into one of your veins.  You will be given one or more of the following: ? A medicine to help you relax (sedative). ? A medicine to numb the area (local anesthetic).  A tool called a Doppler may be used to find the artery.  An incision will be made over the temporal artery.  Two clamps will be placed on the artery. Then, a small piece of the artery between the clamps will be cut and removed.  The remaining ends of the artery will be closed with stitches (sutures) to avoid bleeding. The clamps will then be removed.  The incision will be closed with sutures.  A bandage (dressing) may be placed on the incision.  The artery piece that was taken out will be checked under a microscope. The procedure may vary among health care providers and hospitals. What happens after the procedure?  Your blood pressure, heart rate, breathing rate, and blood oxygen level may be monitored until you leave the hospital or clinic.  You may be given medicine for pain.  Do not drive for 24  hours if you were given a sedative during your procedure. Summary  During a temporal artery biopsy, a sample of the temporal artery is removed and examined under a microscope. This procedure may be done to see if you have a condition that causes your arteries to become swollen or inflamed (temporal arteritis or giant cell arteritis).  Before the procedure, follow instructions from your health care provider about changing or stopping your medicines and  about any eating or drinking restrictions.  Plan to have someone take you home after the procedure and have someone with you for 24 hours.  During the procedure, the incision will be closed with sutures, and a bandage (dressing) may be placed on the incision. This information is not intended to replace advice given to you by your health care provider. Make sure you discuss any questions you have with your health care provider. Document Released: 05/30/2009 Document Revised: 03/05/2018 Document Reviewed: 03/05/2018 Elsevier Patient Education  2020 [employer]  

## 2019-04-07 NOTE — Assessment & Plan Note (Signed)
Continue cardiac and antihypertensive medications as already ordered and reviewed, no changes at this time. Continue statin as ordered and reviewed, no changes at this time Nitrates PRN for chest pain  

## 2019-04-07 NOTE — Telephone Encounter (Signed)
Spoke with the patient and gave her the information regarding her surgery for 04/08/2019 with Dr. Lucky Cowboy. Patient is scheduled for temporal artery biopsy and will do her Covid testing today .

## 2019-04-07 NOTE — Patient Instructions (Signed)
Access Code: [email or web address]  URL: [email or web address]  Date: 04/07/2019  Prepared by: Dorcas Carrow   Exercises  Seated Long Arc Quad - 20 reps - 2 sets - 1x daily - 4x weekly  Seated March - 20 reps - 2 sets - 1x daily - 4x weekly  Seated Heel Raise - 20 reps - 2 sets - 1x daily - 4x weekly  Seated Hamstring Curl with Anchored Resistance - 15 reps - 2 sets - 1x daily - 4x weekly

## 2019-04-07 NOTE — Progress Notes (Addendum)
Patient ID: Charlotte Glover, female   DOB: 1927/09/09, 85 y.o.   MRN: [id number]  Chief Complaint  Patient presents with  . New Patient (Initial Visit)    temporal artery headaches    HPI Charlotte Glover is a 85 y.o. female.  I am asked to see the patient by Dr. Annalee Genta for evaluation for temporal artery biopsy. The patient has recurrent, severe headaches.  They were frontal and now they are right temporal and fontal. The patient is currently on steroids and this does help the headaches.  There is no clear inciting factor or something that really made them worse.  She had similar symptoms a year and a half ago and underwent a left temporal artery biopsy by General Surgery.  She describes this is a very miserable experience where she had to lay still for 2 hours.  That biopsy came back negative but given her severe symptoms and her clinical parameters which favor a temporal arteritis, her rheumatologist would like to have a right temporal artery biopsy performed at this time which sounds pretty reasonable.     Past Medical History:  Diagnosis Date  . Arthritis   . Atrial fibrillation ([place])   . Breast cancer ([place]) 2015   DCIS  . Cancer ([place])   . Cardiac arrest ([place])   . Chickenpox   . Coronary artery disease   . Dyspnea   . Dysrhythmia   . Edema   . GERD (gastroesophageal reflux disease)   . Hearing aid worn    bilateral  . History of MI (myocardial infarction)   . HOH (hard of hearing)   . Myocardial infarction ([place]) 01/01/2013  . S/P ablation of atrial fibrillation   . Shingles     Past Surgical History:  Procedure Laterality Date  . ANTERIOR VITRECTOMY Left 03/20/2017   Procedure: ANTERIOR VITRECTOMY;  Surgeon: Leandrew Koyanagi, MD;  Location: [facility];  Service: Ophthalmology;  Laterality: Left;  IVA TOPICAL LEFT  . ATRIAL ABLATION SURGERY    . BREAST BIOPSY Right 2015   + for DCIS   . BREAST SURGERY    . CARDIAC CATHETERIZATION    .  CARDIAC CATHETERIZATION Bilateral 05/08/2016   Procedure: Right/Left Heart Cath and Coronary Angiography;  Surgeon: Isaias Cowman, MD;  Location: [facility];  Service: Cardiovascular;  Laterality: Bilateral;  . CATARACT EXTRACTION W/PHACO Left 03/20/2017   Procedure: IOL EXCHANGE;  Surgeon: Leandrew Koyanagi, MD;  Location: [facility];  Service: Ophthalmology;  Laterality: Left;  . CHOLECYSTECTOMY    . DILATION AND CURETTAGE OF UTERUS    . ESOPHAGOGASTRODUODENOSCOPY    . PACEMAKER INSERTION    . PERIPHERAL IRIDOTOMY Left 03/20/2017   Procedure: PERIPHERAL IRIDECTOMY;  Surgeon: Leandrew Koyanagi, MD;  Location: [facility];  Service: Ophthalmology;  Laterality: Left;  . ROTATOR CUFF REPAIR Left   . TEE WITHOUT CARDIOVERSION N/A 05/16/2016   Procedure: TRANSESOPHAGEAL ECHOCARDIOGRAM (TEE);  Surgeon: Teodoro Spray, MD;  Location: [facility];  Service: Cardiovascular;  Laterality: N/A;  . TONSILLECTOMY    . uterine polyp      Family History Family History  Problem Relation Age of Onset  . Breast cancer Mother   . Cancer Sister   . Cancer Brother   . Cancer Daughter   . Breast cancer Daughter   . Cancer Son     Social History Social History   Tobacco Use  . Smoking status: Never Smoker  . Smokeless tobacco: Never Used  Substance Use Topics  . Alcohol use: Yes    Alcohol/week: 7.0 standard drinks    Types: 7 Shots of liquor per week    Comment:  (1 scotch daily)  . Drug use: No    Allergies  Allergen Reactions  . Amiodarone Other (See Comments)    Bradycardia and sinus pauses  . Baclofen Other (See Comments)    Double vision  . Metoprolol Rash  . Pantoprazole Rash  . Tape Rash    Current Outpatient Medications  Medication Sig Dispense Refill  . Acetaminophen 500 MG coapsule Take by mouth.    . furosemide (LASIX) 40 MG tablet Take 40 mg by mouth 2 (two) times daily.   3  . gabapentin (NEURONTIN) 300 MG capsule Take 1,200-1,500 mg  by mouth 2 (two) times daily. Take 1500mg  in the morning and 1200mg  at night    . gabapentin (NEURONTIN) 300 MG capsule Take by mouth.    . losartan (COZAAR) 25 MG tablet Take 25 mg by mouth daily.    . metolazone (ZAROXOLYN) 5 MG tablet Take 5 mg by mouth daily as needed.    . Multiple Vitamin (MULTIVITAMIN) tablet Take 1 tablet by mouth daily.    . Multiple Vitamins-Minerals (PRESERVISION AREDS 2) CAPS Take 1 capsule by mouth 2 (two) times daily.    . nitroGLYCERIN (NITROSTAT) 0.4 MG SL tablet Place 0.4 mg under the tongue every 5 (five) minutes x 2 doses as needed for chest pain.     Marland Kitchen omeprazole (PRILOSEC) 20 MG capsule Take 20 mg by mouth 2 (two) times daily before a meal.    . omeprazole (PRILOSEC) 20 MG capsule TAKE 1 CAPSULE(20 MG) BY MOUTH TWICE DAILY    . Polyvinyl Alcohol-Povidone (REFRESH OP) Apply 1 drop to eye daily as needed (dry eyes).    . predniSONE (DELTASONE) 20 MG tablet Take 20 mg by mouth daily with breakfast.    . senna (SENOKOT) 8.6 MG TABS tablet Take 1 tablet by mouth daily as needed for mild constipation.    . calcium carbonate (TUMS - DOSED IN MG ELEMENTAL CALCIUM) 500 MG chewable tablet Chew 1-2 tablets by mouth daily as needed for indigestion or heartburn.    . naproxen sodium (ANAPROX) 220 MG tablet Take 220 mg by mouth as needed.    . nortriptyline (PAMELOR) 10 MG capsule Take 10 mg by mouth every evening.  3  . nortriptyline (PAMELOR) 50 MG capsule Take by mouth.    . potassium chloride SA (K-DUR) 20 MEQ tablet Take 1 tablet (20 mEq total) by mouth 2 (two) times daily for 5 days. 10 tablet 0  . warfarin (COUMADIN) 5 MG tablet Take 5 mg by mouth daily.     No current facility-administered medications for this visit.       REVIEW OF SYSTEMS (Negative unless checked)  Constitutional: [] Weight loss  [] Fever  [] Chills Cardiac: [] Chest pain   [] Chest pressure   [] Palpitations   [] Shortness of breath when laying flat   [] Shortness of breath at rest   [] Shortness  of breath with exertion. Vascular:  [] Pain in legs with walking   [] Pain in legs at rest   [] Pain in legs when laying flat   [] Claudication   [] Pain in feet when walking  [] Pain in feet at rest  [] Pain in feet when laying flat   [] History of DVT   [] Phlebitis   [] Swelling in legs   [] Varicose veins   [] Non-healing ulcers Pulmonary:   [] Uses home oxygen   []   Productive cough   [] Hemoptysis   [] Wheeze  [] COPD   [] Asthma Neurologic:  [] Dizziness  [] Blackouts   [] Seizures   [] History of stroke   [] History of TIA  [] Aphasia   [] Temporary blindness   [] Dysphagia   [] Weakness or numbness in arms   [] Weakness or numbness in legs X positive for headaches Musculoskeletal:  [x] Arthritis   [] Joint swelling   [x] Joint pain   [] Low back pain Hematologic:  [] Easy bruising  [] Easy bleeding   [] Hypercoagulable state   [] Anemic  [] Hepatitis Gastrointestinal:  [] Blood in stool   [] Vomiting blood  [] Gastroesophageal reflux/heartburn   [] Abdominal pain Genitourinary:  [] Chronic kidney disease   [] Difficult urination  [] Frequent urination  [] Burning with urination   [] Hematuria Skin:  [] Rashes   [] Ulcers   [] Wounds Psychological:  [] History of anxiety   []  History of major depression.    Physical Exam BP (!) 144/78 (BP Location: Right Arm)   Pulse 89   Resp 16   Ht 5\' 6"  (1.676 m)   Wt 177 lb (80.3 kg)   BMI 28.57 kg/m  Gen:  WD/WN, NAD. Appears younger than stated age. Head: [place]/AT, No temporalis wasting. Prominent temp pulse not noted on the right. Ear/Nose/Throat: Hearing diminished, nares w/o erythema or drainage, oropharynx w/o Erythema/Exudate Eyes: Conjunctiva clear, sclera non-icteric  Neck: trachea midline.  No JVD.  Pulmonary:  Good air movement, respirations not labored, no use of accessory muscles  Cardiac: Irregular Vascular:  Vessel Right Left  Radial Palpable Palpable                                   Gastrointestinal:. No masses, surgical incisions, or scars. Musculoskeletal: M/S 5/5  throughout.  Extremities without ischemic changes.  No deformity or atrophy. Trace LE edema. Neurologic: Sensation grossly intact in extremities.  Symmetrical.  Speech is fluent. Motor exam as listed above. Psychiatric: Judgment intact, Mood & affect appropriate for pt's clinical situation. Dermatologic: No rashes or ulcers noted.  No cellulitis or open wounds.    Radiology No results found.  Labs No results found for this or any previous visit (from the past 2160 hour(s)).  Assessment/Plan:  Coronary artery disease Continue cardiac and antihypertensive medications as already ordered and reviewed, no changes at this time. Continue statin as ordered and reviewed, no changes at this time Nitrates PRN for chest pain   Atrial fibrillation Rate controlled.  On anticoagulation.  Will hold today for procedure this week.  Temporal arteritis ([place]) The patient has clinical parameters consistent with temporal arteritis.  She is currently on steroids and a formal diagnosis would be helpful in terms of dosing and duration of her steroids.  She has had a previous temporal artery biopsy in the past on the left which was negative, but her clinical concern remains quite high.  For this reason, a right temporal artery biopsy should be performed at this time.  I discussed the risks and benefits the procedure.  I would do this in the hospital either with general anesthesia or sedation.  I have discussed the typical recovery.  The results usually take 1 to 2 days to return if not longer.  She voices her understanding and is agreeable to proceed.  We will try to get this scheduled for this week.      Leotis Pain 04/07/2019, 3:27 PM   This note was created with Dragon medical transcription system.  Any errors from dictation are  unintentional.

## 2019-04-08 ENCOUNTER — Encounter: Payer: Self-pay | Admitting: *Deleted

## 2019-04-08 ENCOUNTER — Ambulatory Visit
Admission: RE | Admit: 2019-04-08 | Discharge: 2019-04-08 | Disposition: A | Discharge status: Home / Self Care | Payer: Medicare Other | Attending: Vascular Surgery | Admitting: Vascular Surgery

## 2019-04-08 ENCOUNTER — Encounter
Admission: RE | Disposition: A | Discharge status: Home / Self Care | Payer: Self-pay | Source: Home / Self Care | Attending: Vascular Surgery

## 2019-04-08 ENCOUNTER — Encounter: Payer: Self-pay | Admitting: Physical Therapy

## 2019-04-08 ENCOUNTER — Encounter: Payer: Medicare Other | Admitting: Physical Therapy

## 2019-04-08 ENCOUNTER — Ambulatory Visit: Payer: Medicare Other | Admitting: Certified Registered"

## 2019-04-08 DIAGNOSIS — R9431 Abnormal electrocardiogram [ECG] [EKG]: Secondary | ICD-10-CM | POA: Diagnosis not present

## 2019-04-08 DIAGNOSIS — M316 Other giant cell arteritis: Secondary | ICD-10-CM | POA: Diagnosis present

## 2019-04-08 DIAGNOSIS — R519 Headache, unspecified: Secondary | ICD-10-CM

## 2019-04-08 HISTORY — PX: ARTERY BIOPSY: SHX891

## 2019-04-08 LAB — TYPE AND SCREEN
ABO/RH(D): A POS
Antibody Screen: NEGATIVE

## 2019-04-08 LAB — COMPREHENSIVE METABOLIC PANEL
ALT: 30 U/L (ref 0–44)
AST: 26 U/L (ref 15–41)
Albumin: 3.5 g/dL (ref 3.5–5.0)
Alkaline Phosphatase: 74 U/L (ref 38–126)
Anion gap: 14 (ref 5–15)
BUN: 33 mg/dL — ABNORMAL HIGH (ref 8–23)
CO2: 29 mmol/L (ref 22–32)
Calcium: 9.3 mg/dL (ref 8.9–10.3)
Chloride: 95 mmol/L — ABNORMAL LOW (ref 98–111)
Creatinine, Ser: 1.13 mg/dL — ABNORMAL HIGH (ref 0.44–1.00)
GFR calc Af Amer: 50 mL/min — ABNORMAL LOW (ref >60)
GFR calc non Af Amer: 43 mL/min — ABNORMAL LOW (ref >60)
Glucose, Bld: 115 mg/dL — ABNORMAL HIGH (ref 70–99)
Potassium: 2.9 mmol/L — ABNORMAL LOW (ref 3.5–5.1)
Sodium: 138 mmol/L (ref 135–145)
Total Bilirubin: 1 mg/dL (ref 0.3–1.2)
Total Protein: 6.2 g/dL — ABNORMAL LOW (ref 6.5–8.1)

## 2019-04-08 LAB — CBC
HCT: 39.9 % (ref 36.0–46.0)
Hemoglobin: 13.3 g/dL (ref 12.0–15.0)
MCH: 30 pg (ref 26.0–34.0)
MCHC: 33.3 g/dL (ref 30.0–36.0)
MCV: 90.1 fL (ref 80.0–100.0)
Platelets: 253 10*3/uL (ref 150–400)
RBC: 4.43 MIL/uL (ref 3.87–5.11)
RDW: 15.7 % — ABNORMAL HIGH (ref 11.5–15.5)
WBC: 10.1 10*3/uL (ref 4.0–10.5)
nRBC: 0 % (ref 0.0–0.2)

## 2019-04-08 LAB — APTT: aPTT: 31 seconds (ref 24–36)

## 2019-04-08 LAB — PROTIME-INR
INR: 1.2 (ref 0.8–1.2)
Prothrombin Time: 15.3 seconds — ABNORMAL HIGH (ref 11.4–15.2)

## 2019-04-08 LAB — ABO/RH: ABO/RH(D): A POS

## 2019-04-08 SURGERY — BIOPSY TEMPORAL ARTERY
Anesthesia: Monitor Anesthesia Care | Site: Face | Laterality: Right

## 2019-04-08 MED ORDER — PROPOFOL 10 MG/ML IV BOLUS
INTRAVENOUS | Status: AC
  Filled 2019-04-08: qty 20

## 2019-04-08 MED ORDER — CHLORHEXIDINE GLUCONATE CLOTH 2 % EX PADS: 6.0000 | MEDICATED_PAD | Freq: Once | CUTANEOUS | Status: DC

## 2019-04-08 MED ORDER — FENTANYL CITRATE (PF) 100 MCG/2ML IJ SOLN
INTRAMUSCULAR | Status: AC
  Filled 2019-04-08: qty 2

## 2019-04-08 MED ORDER — CEFAZOLIN SODIUM-DEXTROSE 2-4 GM/100ML-% IV SOLN
2.0000 g | INTRAVENOUS | Status: AC
  Administered 2019-04-08: 2 g via INTRAVENOUS

## 2019-04-08 MED ORDER — ONDANSETRON HCL 4 MG/2ML IJ SOLN: 4.0000 mg | Freq: Once | INTRAMUSCULAR | Status: DC | PRN

## 2019-04-08 MED ORDER — LACTATED RINGERS IV SOLN
INTRAVENOUS | Status: DC
  Administered 2019-04-08: 11:00:00 via INTRAVENOUS

## 2019-04-08 MED ORDER — LIDOCAINE-EPINEPHRINE 1 %-1:100000 IJ SOLN
INTRAMUSCULAR | Status: AC
  Filled 2019-04-08: qty 1

## 2019-04-08 MED ORDER — LIDOCAINE-EPINEPHRINE 1 %-1:100000 IJ SOLN
INTRAMUSCULAR | Status: DC | PRN
  Administered 2019-04-08: 6 mL

## 2019-04-08 MED ORDER — LIDOCAINE HCL (CARDIAC) PF 100 MG/5ML IV SOSY
PREFILLED_SYRINGE | INTRAVENOUS | Status: DC | PRN
  Administered 2019-04-08: 60 mg via INTRATRACHEAL

## 2019-04-08 MED ORDER — FENTANYL CITRATE (PF) 100 MCG/2ML IJ SOLN: 25.0000 ug | INTRAMUSCULAR | Status: DC | PRN

## 2019-04-08 MED ORDER — LIDOCAINE HCL (PF) 2 % IJ SOLN
INTRAMUSCULAR | Status: AC
  Filled 2019-04-08: qty 10

## 2019-04-08 MED ORDER — CEFAZOLIN SODIUM-DEXTROSE 2-4 GM/100ML-% IV SOLN
INTRAVENOUS | Status: AC
  Filled 2019-04-08: qty 100

## 2019-04-08 MED ORDER — EPHEDRINE SULFATE 50 MG/ML IJ SOLN
INTRAMUSCULAR | Status: DC | PRN
  Administered 2019-04-08: 10 mg via INTRAVENOUS

## 2019-04-08 MED ORDER — FENTANYL CITRATE (PF) 100 MCG/2ML IJ SOLN
INTRAMUSCULAR | Status: DC | PRN
  Administered 2019-04-08 (×2): 25 ug via INTRAVENOUS

## 2019-04-08 MED ORDER — PROPOFOL 500 MG/50ML IV EMUL
INTRAVENOUS | Status: DC | PRN
  Administered 2019-04-08: 50 ug/kg/min via INTRAVENOUS

## 2019-04-08 SURGICAL SUPPLY — 40 items
BLADE SURG 15 STRL LF DISP TIS (BLADE) ×1 IMPLANT
BLADE SURG 15 STRL SS (BLADE) ×2
BLADE SURG SZ11 CARB STEEL (BLADE) ×3 IMPLANT
CNTNR SPEC 2.5X3XGRAD LEK (MISCELLANEOUS) ×1
CONT SPEC 4OZ STER OR WHT (MISCELLANEOUS) ×2
CONTAINER SPEC 2.5X3XGRAD LEK (MISCELLANEOUS) ×1 IMPLANT
COTTON BALL STRL MEDIUM (GAUZE/BANDAGES/DRESSINGS) ×3 IMPLANT
COVER WAND RF STERILE (DRAPES) ×3 IMPLANT
DERMABOND ADVANCED (GAUZE/BANDAGES/DRESSINGS) ×2
DERMABOND ADVANCED .7 DNX12 (GAUZE/BANDAGES/DRESSINGS) ×1 IMPLANT
DRAPE LAPAROTOMY 77X122 PED (DRAPES) ×3 IMPLANT
DRSG TELFA 4X3 1S NADH ST (GAUZE/BANDAGES/DRESSINGS) ×3 IMPLANT
ELECT CAUTERY BLADE 6.4 (BLADE) ×3 IMPLANT
ELECT REM PT RETURN 9FT ADLT (ELECTROSURGICAL) ×3
ELECTRODE REM PT RTRN 9FT ADLT (ELECTROSURGICAL) ×1 IMPLANT
GLOVE BIO SURGEON STRL SZ7 (GLOVE) ×3 IMPLANT
GOWN STRL REUS W/ TWL LRG LVL3 (GOWN DISPOSABLE) ×2 IMPLANT
GOWN STRL REUS W/ TWL XL LVL3 (GOWN DISPOSABLE) ×1 IMPLANT
GOWN STRL REUS W/TWL LRG LVL3 (GOWN DISPOSABLE) ×4
GOWN STRL REUS W/TWL XL LVL3 (GOWN DISPOSABLE) ×2
KIT TURNOVER KIT A (KITS) ×3 IMPLANT
LABEL OR SOLS (LABEL) ×3 IMPLANT
NEEDLE HYPO 25X1 1.5 SAFETY (NEEDLE) IMPLANT
NS IRRIG 500ML POUR BTL (IV SOLUTION) ×3 IMPLANT
PACK BASIN MINOR ARMC (MISCELLANEOUS) ×3 IMPLANT
SOL PREP PVP 2OZ (MISCELLANEOUS) ×3
SOLUTION PREP PVP 2OZ (MISCELLANEOUS) ×1 IMPLANT
SUCTION FRAZIER HANDLE 10FR (MISCELLANEOUS) ×2
SUCTION TUBE FRAZIER 10FR DISP (MISCELLANEOUS) ×1 IMPLANT
SUT MNCRL AB 4-0 PS2 18 (SUTURE) ×3 IMPLANT
SUT SILK 2 0 (SUTURE) ×2
SUT SILK 2-0 18XBRD TIE 12 (SUTURE) ×1 IMPLANT
SUT SILK 3 0 (SUTURE) ×2
SUT SILK 3-0 18XBRD TIE 12 (SUTURE) ×1 IMPLANT
SUT SILK 4 0 (SUTURE) ×2
SUT SILK 4-0 18XBRD TIE 12 (SUTURE) ×1 IMPLANT
SUT VIC AB 3-0 SH 27 (SUTURE) ×2
SUT VIC AB 3-0 SH 27X BRD (SUTURE) ×1 IMPLANT
SYR 10ML LL (SYRINGE) IMPLANT
SYR BULB IRRIG 60ML STRL (SYRINGE) ×3 IMPLANT

## 2019-04-08 NOTE — Anesthesia Preprocedure Evaluation (Addendum)
Anesthesia Evaluation  Patient identified by MRN, date of birth, ID band Patient awake    Reviewed: Allergy & Precautions, H&P , NPO status , Patient's Chart, lab work & pertinent test results, reviewed documented beta blocker date and time   Airway Mallampati: II  TM Distance: >3 FB Neck ROM: full    Dental no notable dental hx. (+) Teeth Intact   Pulmonary shortness of breath and with exertion,    Pulmonary exam normal breath sounds clear to auscultation       Cardiovascular Exercise Tolerance: Poor hypertension, On Medications + CAD, + Past MI, +CHF and + DOE  + dysrhythmias  Rhythm:regular Rate:Normal  PM dependent with recent Parschos interrogaton. Severe MR JA   Neuro/Psych negative neurological ROS  negative psych ROS   GI/Hepatic Neg liver ROS, GERD  Medicated,  Endo/Other  negative endocrine ROSdiabetes, Well Controlled  Renal/GU CRFRenal disease     Musculoskeletal   Abdominal   Peds  Hematology negative hematology ROS (+)   Anesthesia Other Findings   Reproductive/Obstetrics negative OB ROS                            Anesthesia Physical Anesthesia Plan  ASA: II  Anesthesia Plan: MAC   Post-op Pain Management:    Induction:   PONV Risk Score and Plan:   Airway Management Planned:   Additional Equipment:   Intra-op Plan:   Post-operative Plan:   Informed Consent: I have reviewed the patients History and Physical, chart, labs and discussed the procedure including the risks, benefits and alternatives for the proposed anesthesia with the patient or authorized representative who has indicated his/her understanding and acceptance.       Plan Discussed with: CRNA  Anesthesia Plan Comments:         Anesthesia Quick Evaluation

## 2019-04-08 NOTE — Transfer of Care (Signed)
Immediate Anesthesia Transfer of Care Note  Patient: [name]  Procedure(s) Performed: BIOPSY TEMPORAL ARTERY (Right Face)  Patient Location: PACU  Anesthesia Type:MAC  Level of Consciousness: awake and alert   Airway & Oxygen Therapy: Patient Spontanous Breathing and Patient connected to nasal cannula oxygen  Post-op Assessment: Report given to RN and Post -op Vital signs reviewed and stable  Post vital signs: Reviewed and stable  Last Vitals:  Vitals Value Taken Time  BP 116/51 04/08/19 1454  Temp 36.2 C 04/08/19 1454  Pulse 70 04/08/19 1457  Resp 15 04/08/19 1457  SpO2 100 % 04/08/19 1457  Vitals shown include unvalidated device data.  Last Pain:  Vitals:   04/08/19 1454  TempSrc:   PainSc: 0-No pain         Complications: No apparent anesthesia complications

## 2019-04-08 NOTE — Op Note (Signed)
        OPERATIVE NOTE   PRE-OPERATIVE DIAGNOSIS: suspected temporal arteritis, severe persistent headaches  POST-OPERATIVE DIAGNOSIS: Same as above  PROCEDURE: 1.   Right temporal artery biopsy  SURGEON: Leotis Pain, MD  ASSISTANT(S): Hezzie Bump, PA-C  ANESTHESIA: General  ESTIMATED BLOOD LOSS: Minimal  FINDING(S): 1.  none  SPECIMEN(S):  Right  superficial temporal artery sent to pathology  INDICATIONS:   Patient is a 85 y.o. female who presents with severe, persistent headaches and a clinical suspicion high for temporal arteritis. We were consulted by rheumatology for consideration for temporal artery biopsy. Risks and benefits were discussed and she was agreeable to proceed. An assistant was present during the procedure to help facilitate the exposure and expedite the procedure.  DESCRIPTION: After obtaining full informed written consent, the patient was brought back to the operating room and placed supine upon the operating table.  The patient received IV antibiotics prior to induction.  After obtaining adequate anesthesia, the patient was prepped and draped in the standard fashion. The assistant provided retraction and mobilization to help facilitate exposure and expedite the procedure throughout the entire procedure.  This included following suture, using retractors, and optimizing lighting. The area in front of his right ear was anesthetized copiously with a solution of 1% lidocaine with epinephrine. I then made an incision just in front of the right ear overlying the palpable pulse. I then dissected down through the subcutaneous tissues and identified the superficial temporal artery. This was dissected out over a several centimeters and branches were ligated and divided between silk ties. Care was used to avoid electrocautery around the artery. I then clamped the artery proximally and distally and transected the artery. The specimen was then sent to pathology. The proximal and  distal artery were ligated with 3-0 silk ties. Hemostasis was achieved. The wound was then closed with a series of interrupted 3-0 Vicryl's and the skin was closed with a 4-0 Monocryl. Sterile dressing was placed. The patient was taken to the recovery room in stable condition having tolerated the procedure well.  COMPLICATIONS: None  CONDITION: Stable   Leotis Pain 04/08/2019 2:37 PM  This note was created with Dragon Medical transcription system. Any errors in dictation are purely unintentional.

## 2019-04-08 NOTE — OR Nursing (Signed)
Patients hear aids returned to her at the end of the case. She verbally agreed that both hearing aids and their batteries were present.

## 2019-04-08 NOTE — Discharge Instructions (Signed)

## 2019-04-08 NOTE — Therapy (Signed)
[facility] [address] [place], [place], [address] Phone: [phone]   Fax:  [phone]  Physical Therapy Treatment  Patient Details  Name: Charlotte Glover MRN: [id number] Date of Birth: [birth year] Referring Provider (PT): Brett Fairy PA-C   Encounter Date: 04/07/2019  PT End of Session - 04/08/19 0847    Visit Number  5    Number of Visits  9    Date for PT Re-Evaluation  05/01/19    PT Start Time  0915    PT Stop Time  1001    PT Time Calculation (min)  46 min    Equipment Utilized During Treatment  Gait belt    Activity Tolerance  Patient tolerated treatment well    Behavior During Therapy  [facility] for tasks assessed/performed       Past Medical History:  Diagnosis Date  . Arthritis   . Atrial fibrillation ([place])   . Breast cancer ([place]) 2015   DCIS  . Cancer ([place])   . Cardiac arrest ([place])   . Chickenpox   . Coronary artery disease   . Dyspnea   . Dysrhythmia   . Edema   . GERD (gastroesophageal reflux disease)   . Hearing aid worn    bilateral  . History of MI (myocardial infarction)   . HOH (hard of hearing)   . Myocardial infarction ([place]) 01/01/2013  . S/P ablation of atrial fibrillation   . Shingles     Past Surgical History:  Procedure Laterality Date  . ANTERIOR VITRECTOMY Left 03/20/2017   Procedure: ANTERIOR VITRECTOMY;  Surgeon: Leandrew Koyanagi, MD;  Location: [place];  Service: Ophthalmology;  Laterality: Left;  IVA TOPICAL LEFT  . ATRIAL ABLATION SURGERY    . BREAST BIOPSY Right 2015   + for DCIS   . BREAST SURGERY    . CARDIAC CATHETERIZATION    . CARDIAC CATHETERIZATION Bilateral 05/08/2016   Procedure: Right/Left Heart Cath and Coronary Angiography;  Surgeon: Isaias Cowman, MD;  Location: [place] [facility];  Service: Cardiovascular;  Laterality: Bilateral;  . CATARACT EXTRACTION W/PHACO Left 03/20/2017   Procedure: IOL EXCHANGE;  Surgeon: Leandrew Koyanagi, MD;  Location: [place];  Service: Ophthalmology;  Laterality: Left;  . CHOLECYSTECTOMY    . DILATION AND CURETTAGE OF UTERUS    . ESOPHAGOGASTRODUODENOSCOPY    . PACEMAKER INSERTION    . PERIPHERAL IRIDOTOMY Left 03/20/2017   Procedure: PERIPHERAL IRIDECTOMY;  Surgeon: Leandrew Koyanagi, MD;  Location: [place];  Service: Ophthalmology;  Laterality: Left;  . ROTATOR CUFF REPAIR Left   . TEE WITHOUT CARDIOVERSION N/A 05/16/2016   Procedure: TRANSESOPHAGEAL ECHOCARDIOGRAM (TEE);  Surgeon: Teodoro Spray, MD;  Location: [facility];  Service: Cardiovascular;  Laterality: N/A;  . TONSILLECTOMY    . uterine polyp      There were no vitals filed for this visit.  Subjective Assessment - 04/08/19 0839    Subjective  Pt reports having bad headaches over past week.  Pt reports she may have temporal arteritis and that she is going to see vascular this afternoon for a biopsy on the R (biopsy on L in May negative).  Pt reports taking predinsone for one week and that it has improved her headaches; she reports that she took a dose this morning.  Pt states she currently has a dull headache (1/10) with no reports of vertigo/ dizziness.  Pt reports using SPC as balance "safety blanket".    Pertinent History  Patient reports she started having dizziness about a month ago when she would turn over in the morning. Patient reports she has dizziness and vertigo. Patient reports she was seen by ENT services and that she had the Epley maneuver performed 3 times at the ENT office. Patient reports her symptoms got better for a while and now have returned. Patient states she thought she was all better, but states she one night recently she moved and experienced vertigo again. Patient she is cautious when she gets up in the morning and tries not to turn her head. Patient reports she has a fear of falling. Patient reports she has a bad right knee and that is why she uses the [facility]. Patient reports  she has poor balance. Patient reports she has veered when she walks for a long time. Patient reports sometimes she will get dizziness when she is sitting still, but states when that occurs it is not as bad as when she is moving. Patient reports she typically has episodes of sneezing fits where she will sneeze seventeen to twenty times in a row. Patient describes her dizziness as vertigo, unsteadiness, lightheadedness a few times and aural fullness. Patient reports that she experiences dizziness a few times a day and that the episodes last seconds. Patient's symptoms are provoked by bending over, rolling over and turning her head. Patient reports nothing makes her symptoms better. Patient reports she has not had difficulty with dizziness prior to one month age.    Patient Stated Goals  to have decreased dizziness and vertigo and improved balance    Currently in Pain?  No/denies       MMT  Right Left  Hip flexion 3/5 3/5  Hip abduction 4/5 4/5  Hip adduction 4+/5 4+/5  Knee flexion 5/5 5/5  Knee extension 5/5 5/5  Dorsiflexion 5/5 5/5  Plantarflexion 5/5 5/5    Berg: 46/56 - moderate fall risk (>50%) 5xSTS: 26.2s  Therapeutic Exercise: Gait in clinic with SPC - excessive R knee valgus and R ankle pronation in stance; inconsistent use of SPC with unintentional placement Seated marching, LAQ, heel raises with 3# ankle weights - 2x20 B each Seated hamstring curls with RTB resistance - 2x20 B    PT Education - 04/08/19 0847    Education Details  Pt educated on HEP    Person(s) Educated  Patient    Methods  Explanation;Demonstration    Comprehension  Verbalized understanding;Returned demonstration       PT Short Term Goals - 03/23/19 0910      PT SHORT TERM GOAL #1   Title  Patient will report 50% or greater improvement in her symptoms of dizziness and imbalance with provoking motions or positions.    Baseline  100% on 03/23/19    Time  4    Period  Weeks    Status  Achieved     Target Date  03/27/19        PT Long Term Goals - 04/08/19 1821      PT LONG TERM GOAL #1   Title  Patient will report 75% or greater improvement in her symptoms of dizziness and imbalance with provoking motions or positions    Baseline  Patient reports 100% improvement in her symptoms on 03/23/19    Time  8    Period  Weeks    Status  Achieved      PT LONG TERM GOAL #2   Title  Patient will reduce falls risk as indicated by  Activities Specific Balance Confidence Scale (ABC) >67%.    Baseline  scored 82.5% on 03/23/19    Time  8    Period  Weeks    Status  Achieved      PT LONG TERM GOAL #3   Title  Patient will demonstrate reduced falls risk as evidenced by Dynamic Gait Index (DGI) >19/24.    Baseline  scored 17/24 on 03/23/19    Time  8    Period  Weeks    Status  On-going      PT LONG TERM GOAL #4   Title  Patient reports no vertigo with provoking motions or positions.    Time  8    Period  Weeks    Status  Achieved      PT LONG TERM GOAL #5   Title  Pt will decrease 5TSTS by at least 3 seconds in order to demonstrate clinically significant improvement in LE strength.    Baseline  5xSTS 26.2 sec 04/07/2019    Time  8    Period  Weeks    Status  New    Target Date  06/02/19      Additional Long Term Goals   Additional Long Term Goals  Yes      PT LONG TERM GOAL #6   Title  Pt will increase Berg score to at least 50 to indicate a decrease in fall risk.    Baseline  Berg score 46/56    Time  8    Period  Weeks    Status  New    Target Date  06/02/19            Plan - 04/08/19 0904    Clinical Impression Statement  Pt presents to PT with deficits in B LE strength, balance, and gait mechanics.  Pt Berg score of 46/56 indicates moderate fall risk; results reveal limitations in LE power and tandem/ single leg balance.  Gait in clinic with [facility] reveals excessive R knee valgus, R ankle pronation in stance, and inconsistent use of SPC with unintentional placement.   Pt educated on LE strengthening for HEP.  Pt will benefit from skilled therapy to increase B LE strength, improve balance/ gait mechanics and decrease fall risk.    Personal Factors and Comorbidities  Age;Comorbidity 3+    Comorbidities  MI, CAD, arthritis    Examination-Activity Limitations  Bend    Stability/Clinical Decision Making  Evolving/Moderate complexity    Clinical Decision Making  Moderate    Rehab Potential  Good    PT Frequency  1x / week    PT Duration  8 weeks    PT Treatment/Interventions  Canalith Repostioning;ADLs/Self Care Home Management;Gait training;Stair training;Therapeutic activities;Therapeutic exercise;Balance training;Neuromuscular re-education;Patient/family education;Vestibular    PT Next Visit Plan  Progress LE strengthening, gait training indoors/ outdoors, gait with head turns    PT Home Exercise Plan  [id number]    Consulted and Agree with Plan of Care  Patient       Patient will benefit from skilled therapeutic intervention in order to improve the following deficits and impairments:  Decreased balance, Difficulty walking, Dizziness, Decreased strength  Visit Diagnosis: Difficulty in walking, not elsewhere classified  Muscle weakness (generalized)  Balance problem     Problem List Patient Active Problem List   Diagnosis Date Noted  . Temporal arteritis ([place]) 04/07/2019  . Cardiac pacemaker 05/18/2014  . Atrial fibrillation ([place]) 11/27/2011  . Coronary artery disease 11/27/2011   Pura Spice,  PT, DPT # [id number] Chinita Greenland, SPT 04/08/2019, 6:34 PM  [facility] [address] [place], [place], [address] Phone: [phone]   Fax:  [phone]  Name: Tima Than MRN: [id number] Date of Birth: 1927-09-20

## 2019-04-08 NOTE — Anesthesia Post-op Follow-up Note (Signed)
Anesthesia QCDR form completed.        

## 2019-04-08 NOTE — H&P (Signed)
[facility] History & Physical Update  The patient was interviewed and re-examined.  The patient's previous History and Physical has been reviewed and is unchanged.  There is no change in the plan of care. We plan to proceed with the scheduled procedure.  Leotis Pain, MD  04/08/2019, 11:15 AM

## 2019-04-09 ENCOUNTER — Encounter: Payer: Self-pay | Admitting: Vascular Surgery

## 2019-04-10 LAB — SURGICAL PATHOLOGY

## 2019-04-14 ENCOUNTER — Ambulatory Visit: Payer: Medicare Other | Admitting: Physical Therapy

## 2019-04-14 ENCOUNTER — Other Ambulatory Visit: Payer: Self-pay

## 2019-04-14 ENCOUNTER — Encounter: Payer: Self-pay | Admitting: Physical Therapy

## 2019-04-14 DIAGNOSIS — R262 Difficulty in walking, not elsewhere classified: Secondary | ICD-10-CM

## 2019-04-14 DIAGNOSIS — M6281 Muscle weakness (generalized): Secondary | ICD-10-CM

## 2019-04-14 DIAGNOSIS — R2689 Other abnormalities of gait and mobility: Secondary | ICD-10-CM

## 2019-04-14 NOTE — Therapy (Signed)
[facility] [address] [place], [place], [address] Phone: [phone]   Fax:  [phone]  Physical Therapy Treatment  Patient Details  Name: Charlotte Glover MRN: [id number] Date of Birth: Mar 11, 1928 Referring Provider (PT): Brett Fairy PA-C   Encounter Date: 04/14/2019  PT End of Session - 04/14/19 1807    Visit Number  6    Number of Visits  9    Date for PT Re-Evaluation  05/01/19    Authorization - Visit Number  6    Authorization - Number of Visits  10    PT Start Time  0914    PT Stop Time  1002    PT Time Calculation (min)  48 min    Equipment Utilized During Treatment  Gait belt    Activity Tolerance  Patient tolerated treatment well    Behavior During Therapy  [facility] for tasks assessed/performed       Past Medical History:  Diagnosis Date  . Arthritis   . Atrial fibrillation ([place])   . Breast cancer ([place]) 2015   DCIS  . Cancer ([place])   . Cardiac arrest ([place])   . Chickenpox   . Coronary artery disease   . Dyspnea   . Dysrhythmia   . Edema   . GERD (gastroesophageal reflux disease)   . Hearing aid worn    bilateral  . History of MI (myocardial infarction)   . HOH (hard of hearing)   . Myocardial infarction ([place]) 01/01/2013  . S/P ablation of atrial fibrillation   . Shingles     Past Surgical History:  Procedure Laterality Date  . ANTERIOR VITRECTOMY Left 03/20/2017   Procedure: ANTERIOR VITRECTOMY;  Surgeon: Leandrew Koyanagi, MD;  Location: [place];  Service: Ophthalmology;  Laterality: Left;  IVA TOPICAL LEFT  . ARTERY BIOPSY Right 04/08/2019   Procedure: BIOPSY TEMPORAL ARTERY;  Surgeon: Algernon Huxley, MD;  Location: [facility];  Service: Vascular;  Laterality: Right;  . ATRIAL ABLATION SURGERY    . BREAST BIOPSY Right 2015   + for DCIS   . BREAST SURGERY    . CARDIAC CATHETERIZATION    . CARDIAC CATHETERIZATION Bilateral 05/08/2016   Procedure: Right/Left Heart Cath and  Coronary Angiography;  Surgeon: Isaias Cowman, MD;  Location: [place] [facility];  Service: Cardiovascular;  Laterality: Bilateral;  . CATARACT EXTRACTION W/PHACO Left 03/20/2017   Procedure: IOL EXCHANGE;  Surgeon: Leandrew Koyanagi, MD;  Location: [place];  Service: Ophthalmology;  Laterality: Left;  . CHOLECYSTECTOMY    . DILATION AND CURETTAGE OF UTERUS    . ESOPHAGOGASTRODUODENOSCOPY    . PACEMAKER INSERTION    . PERIPHERAL IRIDOTOMY Left 03/20/2017   Procedure: PERIPHERAL IRIDECTOMY;  Surgeon: Leandrew Koyanagi, MD;  Location: [place];  Service: Ophthalmology;  Laterality: Left;  . ROTATOR CUFF REPAIR Left   . TEE WITHOUT CARDIOVERSION N/A 05/16/2016   Procedure: TRANSESOPHAGEAL ECHOCARDIOGRAM (TEE);  Surgeon: Teodoro Spray, MD;  Location: [facility];  Service: Cardiovascular;  Laterality: N/A;  . TONSILLECTOMY    . uterine polyp      There were no vitals filed for this visit.  Subjective Assessment - 04/14/19 1803    Subjective  Pt reports headache today 2-3/10 and that head is still sore from biopsy last Wednesday; has been on large doses of prednisone for a week (started at 80mg , at 40mg  now).    Pertinent History  Patient reports she started having dizziness about a month  ago when she would turn over in the morning. Patient reports she has dizziness and vertigo. Patient reports she was seen by ENT services and that she had the Epley maneuver performed 3 times at the ENT office. Patient reports her symptoms got better for a while and now have returned. Patient states she thought she was all better, but states she one night recently she moved and experienced vertigo again. Patient she is cautious when she gets up in the morning and tries not to turn her head. Patient reports she has a fear of falling. Patient reports she has a bad right knee and that is why she uses the [facility]. Patient reports she has poor balance. Patient reports she has veered when she  walks for a long time. Patient reports sometimes she will get dizziness when she is sitting still, but states when that occurs it is not as bad as when she is moving. Patient reports she typically has episodes of sneezing fits where she will sneeze seventeen to twenty times in a row. Patient describes her dizziness as vertigo, unsteadiness, lightheadedness a few times and aural fullness. Patient reports that she experiences dizziness a few times a day and that the episodes last seconds. Patient's symptoms are provoked by bending over, rolling over and turning her head. Patient reports nothing makes her symptoms better. Patient reports she has not had difficulty with dizziness prior to one month age.    Patient Stated Goals  to have decreased dizziness and vertigo and improved balance    Currently in Pain?  No/denies       Therapeutic Exercise: Nustep L1 x10 min Seated marching/ LAQ/ heel raises, no weights - 2x10 each Stair tapping on 6" step - 2x10 with unilateral UE support for balance   Neuro: Gait around clinic with head turns: horizontal, vertical - x2 each Rhomberg standing with head turns: horizontal, vertical - x10 each Standing on foam with head turns: rhomberg, narrow - x10 each Gait on outdoor surfaces with cane: inclines, curbs, grass    PT Education - 04/14/19 1804    Education Details  Pt educated on gait on outdoor surfaces    Person(s) Educated  Patient    Methods  Explanation    Comprehension  Verbalized understanding;Returned demonstration       PT Short Term Goals - 03/23/19 0910      PT SHORT TERM GOAL #1   Title  Patient will report 50% or greater improvement in her symptoms of dizziness and imbalance with provoking motions or positions.    Baseline  100% on 03/23/19    Time  4    Period  Weeks    Status  Achieved    Target Date  03/27/19        PT Long Term Goals - 04/08/19 1821      PT LONG TERM GOAL #1   Title  Patient will report 75% or greater  improvement in her symptoms of dizziness and imbalance with provoking motions or positions    Baseline  Patient reports 100% improvement in her symptoms on 03/23/19    Time  8    Period  Weeks    Status  Achieved      PT LONG TERM GOAL #2   Title  Patient will reduce falls risk as indicated by Activities Specific Balance Confidence Scale (ABC) >67%.    Baseline  scored 82.5% on 03/23/19    Time  8    Period  Weeks  Status  Achieved      PT LONG TERM GOAL #3   Title  Patient will demonstrate reduced falls risk as evidenced by Dynamic Gait Index (DGI) >19/24.    Baseline  scored 17/24 on 03/23/19    Time  8    Period  Weeks    Status  On-going      PT LONG TERM GOAL #4   Title  Patient reports no vertigo with provoking motions or positions.    Time  8    Period  Weeks    Status  Achieved      PT LONG TERM GOAL #5   Title  Pt will decrease 5TSTS by at least 3 seconds in order to demonstrate clinically significant improvement in LE strength.    Baseline  5xSTS 26.2 sec 04/07/2019    Time  8    Period  Weeks    Status  New    Target Date  06/02/19      Additional Long Term Goals   Additional Long Term Goals  Yes      PT LONG TERM GOAL #6   Title  Pt will increase Berg score to at least 50 to indicate a decrease in fall risk.    Baseline  Berg score 46/56    Time  8    Period  Weeks    Status  New    Target Date  06/02/19            Plan - 04/14/19 1809    Clinical Impression Statement  Pt limited in therapy 2/2 fatigue and headache.  Pt demonstrates increased postural sway with head turns in standing on foam, no LOB throughout session.  Pt required frequent rest breaks to catch her breath and expressed a lack of confidence in her ability to perform exercises, reporting that she surprised herself with completion of many endurance, balance, and gait activities. During gait outdoors with SPC, pt expresses fear of falling backwards on inclines and ascending/ descending  curbs.  Pt will benefit from further skilled therapy to increase LE strength/ endurance, improve balance/ gait mechanics, and decrease fall risk.    Personal Factors and Comorbidities  Age;Comorbidity 3+    Comorbidities  MI, CAD, arthritis    Examination-Activity Limitations  Bend    Stability/Clinical Decision Making  Evolving/Moderate complexity    Clinical Decision Making  Moderate    Rehab Potential  Good    PT Frequency  1x / week    PT Duration  8 weeks    PT Treatment/Interventions  Canalith Repostioning;ADLs/Self Care Home Management;Gait training;Stair training;Therapeutic activities;Therapeutic exercise;Balance training;Neuromuscular re-education;Patient/family education;Vestibular    PT Next Visit Plan  Gait with head turns, curbs with SPC, LE strengthening    PT Home Exercise Plan  [id number]    Consulted and Agree with Plan of Care  Patient       Patient will benefit from skilled therapeutic intervention in order to improve the following deficits and impairments:  Decreased balance, Difficulty walking, Dizziness, Decreased strength  Visit Diagnosis: Difficulty in walking, not elsewhere classified  Muscle weakness (generalized)  Balance problem     Problem List Patient Active Problem List   Diagnosis Date Noted  . Temporal arteritis ([place]) 04/07/2019  . Cardiac pacemaker 05/18/2014  . Atrial fibrillation ([place]) 11/27/2011  . Coronary artery disease 11/27/2011   Pura Spice, PT, DPT # [id number] Chinita Greenland, SPT 04/14/2019, 6:18 PM  [facility]  [address] [place], [place], [address] Phone: [phone]   Fax:  [phone]  Name: Charlotte Glover MRN: [id number] Date of Birth: [birth year]

## 2019-04-15 ENCOUNTER — Encounter: Payer: Medicare Other | Admitting: Physical Therapy

## 2019-04-21 ENCOUNTER — Ambulatory Visit: Payer: Medicare Other | Admitting: Physical Therapy

## 2019-04-21 ENCOUNTER — Encounter: Payer: Self-pay | Admitting: Physical Therapy

## 2019-04-21 ENCOUNTER — Other Ambulatory Visit: Payer: Self-pay

## 2019-04-21 DIAGNOSIS — R2689 Other abnormalities of gait and mobility: Secondary | ICD-10-CM

## 2019-04-21 DIAGNOSIS — R262 Difficulty in walking, not elsewhere classified: Secondary | ICD-10-CM | POA: Diagnosis not present

## 2019-04-21 DIAGNOSIS — M6281 Muscle weakness (generalized): Secondary | ICD-10-CM

## 2019-04-21 NOTE — Therapy (Signed)
[facility] [address] [place], [place], [address] Phone: [phone]   Fax:  [phone]  Physical Therapy Treatment  Patient Details  Name: Charlotte Glover MRN: [id number] Date of Birth: 1927-11-18 Referring Provider (PT): Brett Fairy PA-C   Encounter Date: 04/21/2019  PT End of Session - 04/21/19 0921    Visit Number  7    Number of Visits  9    Date for PT Re-Evaluation  05/01/19    Authorization - Visit Number  7    Authorization - Number of Visits  10    PT Start Time  0915    PT Stop Time  1001    PT Time Calculation (min)  46 min    Equipment Utilized During Treatment  Gait belt    Activity Tolerance  Patient tolerated treatment well    Behavior During Therapy  [facility] for tasks assessed/performed       Past Medical History:  Diagnosis Date  . Arthritis   . Atrial fibrillation ([place])   . Breast cancer ([place]) 2015   DCIS  . Cancer ([place])   . Cardiac arrest ([place])   . Chickenpox   . Coronary artery disease   . Dyspnea   . Dysrhythmia   . Edema   . GERD (gastroesophageal reflux disease)   . Hearing aid worn    bilateral  . History of MI (myocardial infarction)   . HOH (hard of hearing)   . Myocardial infarction ([place]) 01/01/2013  . S/P ablation of atrial fibrillation   . Shingles     Past Surgical History:  Procedure Laterality Date  . ANTERIOR VITRECTOMY Left 03/20/2017   Procedure: ANTERIOR VITRECTOMY;  Surgeon: Leandrew Koyanagi, MD;  Location: [place];  Service: Ophthalmology;  Laterality: Left;  IVA TOPICAL LEFT  . ARTERY BIOPSY Right 04/08/2019   Procedure: BIOPSY TEMPORAL ARTERY;  Surgeon: Algernon Huxley, MD;  Location: [facility];  Service: Vascular;  Laterality: Right;  . ATRIAL ABLATION SURGERY    . BREAST BIOPSY Right 2015   + for DCIS   . BREAST SURGERY    . CARDIAC CATHETERIZATION    . CARDIAC CATHETERIZATION Bilateral 05/08/2016   Procedure: Right/Left Heart Cath and  Coronary Angiography;  Surgeon: Isaias Cowman, MD;  Location: [place] [facility];  Service: Cardiovascular;  Laterality: Bilateral;  . CATARACT EXTRACTION W/PHACO Left 03/20/2017   Procedure: IOL EXCHANGE;  Surgeon: Leandrew Koyanagi, MD;  Location: [place];  Service: Ophthalmology;  Laterality: Left;  . CHOLECYSTECTOMY    . DILATION AND CURETTAGE OF UTERUS    . ESOPHAGOGASTRODUODENOSCOPY    . PACEMAKER INSERTION    . PERIPHERAL IRIDOTOMY Left 03/20/2017   Procedure: PERIPHERAL IRIDECTOMY;  Surgeon: Leandrew Koyanagi, MD;  Location: [place];  Service: Ophthalmology;  Laterality: Left;  . ROTATOR CUFF REPAIR Left   . TEE WITHOUT CARDIOVERSION N/A 05/16/2016   Procedure: TRANSESOPHAGEAL ECHOCARDIOGRAM (TEE);  Surgeon: Teodoro Spray, MD;  Location: [facility];  Service: Cardiovascular;  Laterality: N/A;  . TONSILLECTOMY    . uterine polyp      There were no vitals filed for this visit.  Subjective Assessment - 04/21/19 0914    Subjective  Pt reports 5/10 headache.  Pt reports will be seeing a neurologist this afternoon.  Pt reports can stand up from bed without using hands which she says is a "big accomplishment."    Pertinent History  Patient reports she started having dizziness about a  month ago when she would turn over in the morning. Patient reports she has dizziness and vertigo. Patient reports she was seen by ENT services and that she had the Epley maneuver performed 3 times at the ENT office. Patient reports her symptoms got better for a while and now have returned. Patient states she thought she was all better, but states she one night recently she moved and experienced vertigo again. Patient she is cautious when she gets up in the morning and tries not to turn her head. Patient reports she has a fear of falling. Patient reports she has a bad right knee and that is why she uses the [facility]. Patient reports she has poor balance. Patient reports she has veered  when she walks for a long time. Patient reports sometimes she will get dizziness when she is sitting still, but states when that occurs it is not as bad as when she is moving. Patient reports she typically has episodes of sneezing fits where she will sneeze seventeen to twenty times in a row. Patient describes her dizziness as vertigo, unsteadiness, lightheadedness a few times and aural fullness. Patient reports that she experiences dizziness a few times a day and that the episodes last seconds. Patient's symptoms are provoked by bending over, rolling over and turning her head. Patient reports nothing makes her symptoms better. Patient reports she has not had difficulty with dizziness prior to one month age.    Patient Stated Goals  to have decreased dizziness and vertigo and improved balance    Currently in Pain?  No/denies        Therapeutic Exercise: Nustep L0 x10 min Seated marching/ LAQ/ heel raises 3# ankle weights - 2x20 each Marching in // bars forward/ backward 3# ankle weights - x5 Lateral walking 3# ankle weights - x4 Gait on outdoor surfaces with cane: inclines, curbs, grass - x1 lap - patient unwilling to continue after 1 lap   PT Education - 04/21/19 1723    Education Details  Pt educated on exercise mechanics    Person(s) Educated  Patient    Methods  Explanation;Demonstration    Comprehension  Verbalized understanding;Returned demonstration       PT Short Term Goals - 03/23/19 0910      PT SHORT TERM GOAL #1   Title  Patient will report 50% or greater improvement in her symptoms of dizziness and imbalance with provoking motions or positions.    Baseline  100% on 03/23/19    Time  4    Period  Weeks    Status  Achieved    Target Date  03/27/19        PT Long Term Goals - 04/08/19 1821      PT LONG TERM GOAL #1   Title  Patient will report 75% or greater improvement in her symptoms of dizziness and imbalance with provoking motions or positions    Baseline  Patient  reports 100% improvement in her symptoms on 03/23/19    Time  8    Period  Weeks    Status  Achieved      PT LONG TERM GOAL #2   Title  Patient will reduce falls risk as indicated by Activities Specific Balance Confidence Scale (ABC) >67%.    Baseline  scored 82.5% on 03/23/19    Time  8    Period  Weeks    Status  Achieved      PT LONG TERM GOAL #3   Title  Patient  will demonstrate reduced falls risk as evidenced by Dynamic Gait Index (DGI) >19/24.    Baseline  scored 17/24 on 03/23/19    Time  8    Period  Weeks    Status  On-going      PT LONG TERM GOAL #4   Title  Patient reports no vertigo with provoking motions or positions.    Time  8    Period  Weeks    Status  Achieved      PT LONG TERM GOAL #5   Title  Pt will decrease 5TSTS by at least 3 seconds in order to demonstrate clinically significant improvement in LE strength.    Baseline  5xSTS 26.2 sec 04/07/2019    Time  8    Period  Weeks    Status  New    Target Date  06/02/19      Additional Long Term Goals   Additional Long Term Goals  Yes      PT LONG TERM GOAL #6   Title  Pt will increase Berg score to at least 50 to indicate a decrease in fall risk.    Baseline  Berg score 46/56    Time  8    Period  Weeks    Status  New    Target Date  06/02/19            Plan - 04/21/19 1725    Clinical Impression Statement  Pt somewhat limited by fatigue and SOB, but is able to finish exercise reps and takes rest breaks between sets.  Pt experirences R knee pain when beginning LE warm up on bike that dissipates with continued use.  Pt expresses an initial lack of confidence in ability to perform exercises and gait activities, but is willing to try and performs tasks well with no LOB or postural sway; pt seems confident in performing her HEP.  Pt discontinued outdoor walking after 1 lap in the front of the clinic saying she was "done for the day."  Pt will benefit from further skilled therapy to increase LE strength,  improve balance/ gait mechanics, and decrease fall risk.    Personal Factors and Comorbidities  Age;Comorbidity 3+    Comorbidities  MI, CAD, arthritis    Examination-Activity Limitations  Bend    Stability/Clinical Decision Making  Evolving/Moderate complexity    Clinical Decision Making  Moderate    Rehab Potential  Good    PT Frequency  1x / week    PT Duration  8 weeks    PT Treatment/Interventions  Canalith Repostioning;ADLs/Self Care Home Management;Gait training;Stair training;Therapeutic activities;Therapeutic exercise;Balance training;Neuromuscular re-education;Patient/family education;Vestibular    PT Next Visit Plan  LE strengthening, outdoor walking    PT Home Exercise Plan  [id number]    Consulted and Agree with Plan of Care  Patient       Patient will benefit from skilled therapeutic intervention in order to improve the following deficits and impairments:  Decreased balance, Difficulty walking, Dizziness, Decreased strength  Visit Diagnosis: Difficulty in walking, not elsewhere classified  Muscle weakness (generalized)  Balance problem     Problem List Patient Active Problem List   Diagnosis Date Noted  . Temporal arteritis ([place]) 04/07/2019  . Cardiac pacemaker 05/18/2014  . Atrial fibrillation ([place]) 11/27/2011  . Coronary artery disease 11/27/2011   Pura Spice, PT, DPT # [id number] Chinita Greenland, SPT 04/21/2019, 5:34 PM  [facility] [address] [place], [place], [address]  Phone: [phone]   Fax:  [phone]  Name: Deniss Freundlich MRN: [id number] Date of Birth: January 04, 1928

## 2019-04-21 NOTE — Anesthesia Postprocedure Evaluation (Signed)
Anesthesia Post Note  Patient: Charlotte Glover  Procedure(s) Performed: BIOPSY TEMPORAL ARTERY (Right Face)  Patient location during evaluation: PACU Anesthesia Type: MAC Level of consciousness: awake and alert Pain management: pain level controlled Vital Signs Assessment: post-procedure vital signs reviewed and stable Respiratory status: spontaneous breathing, nonlabored ventilation, respiratory function stable and patient connected to nasal cannula oxygen Cardiovascular status: blood pressure returned to baseline and stable Postop Assessment: no apparent nausea or vomiting Anesthetic complications: no     Last Vitals:  Vitals:   04/08/19 1524 04/08/19 1531  BP: (!) 126/58 (!) 131/50  Pulse: 70 75  Resp: 13 16  Temp: (!) 36.3 C (!) 36.2 C  SpO2: 98% 100%    Last Pain:  Vitals:   04/09/19 0839  TempSrc:   PainSc: [place]

## 2019-04-22 ENCOUNTER — Ambulatory Visit (INDEPENDENT_AMBULATORY_CARE_PROVIDER_SITE_OTHER): Payer: Medicare Other | Admitting: Nurse Practitioner

## 2019-04-22 ENCOUNTER — Encounter: Payer: Medicare Other | Admitting: Physical Therapy

## 2019-04-28 ENCOUNTER — Ambulatory Visit: Payer: Medicare Other | Admitting: Physical Therapy

## 2019-04-29 ENCOUNTER — Encounter: Payer: Self-pay | Admitting: Physical Therapy

## 2019-04-29 ENCOUNTER — Encounter: Payer: Medicare Other | Admitting: Physical Therapy

## 2019-04-29 ENCOUNTER — Ambulatory Visit: Payer: Medicare Other | Attending: Physician Assistant | Admitting: Physical Therapy

## 2019-04-29 ENCOUNTER — Other Ambulatory Visit: Payer: Self-pay

## 2019-04-29 DIAGNOSIS — M6281 Muscle weakness (generalized): Secondary | ICD-10-CM | POA: Insufficient documentation

## 2019-04-29 DIAGNOSIS — R42 Dizziness and giddiness: Secondary | ICD-10-CM | POA: Insufficient documentation

## 2019-04-29 DIAGNOSIS — H8111 Benign paroxysmal vertigo, right ear: Secondary | ICD-10-CM | POA: Insufficient documentation

## 2019-04-29 DIAGNOSIS — R262 Difficulty in walking, not elsewhere classified: Secondary | ICD-10-CM | POA: Insufficient documentation

## 2019-04-29 DIAGNOSIS — R2689 Other abnormalities of gait and mobility: Secondary | ICD-10-CM | POA: Insufficient documentation

## 2019-04-29 NOTE — Therapy (Signed)
[place] [facility] [address] [place], [place], [address] Phone: [phone]   Fax:  [phone]  Physical Therapy Treatment  Patient Details  Name: Charlotte Glover MRN: [id number] Date of Birth: 25-Dec-1927 Referring Provider (PT): Brett Fairy PA-C   Encounter Date: 04/29/2019  PT End of Session - 04/29/19 1224    Visit Number  8    Number of Visits  9    Date for PT Re-Evaluation  05/01/19    Authorization - Visit Number  8    Authorization - Number of Visits  10    PT Start Time  1108    PT Stop Time  1156    PT Time Calculation (min)  48 min    Equipment Utilized During Treatment  Gait belt    Activity Tolerance  Patient tolerated treatment well    Behavior During Therapy  [facility] for tasks assessed/performed       Past Medical History:  Diagnosis Date  . Arthritis   . Atrial fibrillation ([place])   . Breast cancer ([place]) 2015   DCIS  . Cancer ([place])   . Cardiac arrest ([place])   . Chickenpox   . Coronary artery disease   . Dyspnea   . Dysrhythmia   . Edema   . GERD (gastroesophageal reflux disease)   . Hearing aid worn    bilateral  . History of MI (myocardial infarction)   . HOH (hard of hearing)   . Myocardial infarction ([place]) 01/01/2013  . S/P ablation of atrial fibrillation   . Shingles     Past Surgical History:  Procedure Laterality Date  . ANTERIOR VITRECTOMY Left 03/20/2017   Procedure: ANTERIOR VITRECTOMY;  Surgeon: Leandrew Koyanagi, MD;  Location: [place];  Service: Ophthalmology;  Laterality: Left;  IVA TOPICAL LEFT  . ARTERY BIOPSY Right 04/08/2019   Procedure: BIOPSY TEMPORAL ARTERY;  Surgeon: Algernon Huxley, MD;  Location: [facility];  Service: Vascular;  Laterality: Right;  . ATRIAL ABLATION SURGERY    . BREAST BIOPSY Right 2015   + for DCIS   . BREAST SURGERY    . CARDIAC CATHETERIZATION    . CARDIAC CATHETERIZATION Bilateral 05/08/2016   Procedure: Right/Left Heart Cath and  Coronary Angiography;  Surgeon: Isaias Cowman, MD;  Location: [place] [facility];  Service: Cardiovascular;  Laterality: Bilateral;  . CATARACT EXTRACTION W/PHACO Left 03/20/2017   Procedure: IOL EXCHANGE;  Surgeon: Leandrew Koyanagi, MD;  Location: [place];  Service: Ophthalmology;  Laterality: Left;  . CHOLECYSTECTOMY    . DILATION AND CURETTAGE OF UTERUS    . ESOPHAGOGASTRODUODENOSCOPY    . PACEMAKER INSERTION    . PERIPHERAL IRIDOTOMY Left 03/20/2017   Procedure: PERIPHERAL IRIDECTOMY;  Surgeon: Leandrew Koyanagi, MD;  Location: [place];  Service: Ophthalmology;  Laterality: Left;  . ROTATOR CUFF REPAIR Left   . TEE WITHOUT CARDIOVERSION N/A 05/16/2016   Procedure: TRANSESOPHAGEAL ECHOCARDIOGRAM (TEE);  Surgeon: Teodoro Spray, MD;  Location: [facility];  Service: Cardiovascular;  Laterality: N/A;  . TONSILLECTOMY    . uterine polyp      There were no vitals filed for this visit.  Subjective Assessment - 04/29/19 1223    Subjective  Pt. states she is doing okay.  No new complaints with headaches.  Pt. drove to PT today and entered clinic with use of SPC.    Pertinent History  Patient reports she started having dizziness about a month ago when she would turn over  in the morning. Patient reports she has dizziness and vertigo. Patient reports she was seen by ENT services and that she had the Epley maneuver performed 3 times at the ENT office. Patient reports her symptoms got better for a while and now have returned. Patient states she thought she was all better, but states she one night recently she moved and experienced vertigo again. Patient she is cautious when she gets up in the morning and tries not to turn her head. Patient reports she has a fear of falling. Patient reports she has a bad right knee and that is why she uses the [facility]. Patient reports she has poor balance. Patient reports she has veered when she walks for a long time. Patient reports sometimes  she will get dizziness when she is sitting still, but states when that occurs it is not as bad as when she is moving. Patient reports she typically has episodes of sneezing fits where she will sneeze seventeen to twenty times in a row. Patient describes her dizziness as vertigo, unsteadiness, lightheadedness a few times and aural fullness. Patient reports that she experiences dizziness a few times a day and that the episodes last seconds. Patient's symptoms are provoked by bending over, rolling over and turning her head. Patient reports nothing makes her symptoms better. Patient reports she has not had difficulty with dizziness prior to one month age.    Patient Stated Goals  to have decreased dizziness and vertigo and improved balance    Currently in Pain?  No/denies        Therapeutic Exercise:  Nustep B UE/LE L3 x 10 min. Seated marching/ LAQ/ heel raises 3# ankle weights -20x2 each Marching in // bars forward/ backward/ 3# ankle weights - x5 Lateral walking 3# ankle weights - x4 Step touches (3" plinth): toe/ heel strike on L/R 20x2 Walking in hallway with use of SPC (2-point gait) with cuing to correct posture/ head position/ BOS Gait on outdoor surfaces with cane to car (maneuvering thresholds/ curb/ ramp)    PT Short Term Goals - 03/23/19 0910      PT SHORT TERM GOAL #1   Title  Patient will report 50% or greater improvement in her symptoms of dizziness and imbalance with provoking motions or positions.    Baseline  100% on 03/23/19    Time  4    Period  Weeks    Status  Achieved    Target Date  03/27/19        PT Long Term Goals - 04/08/19 1821      PT LONG TERM GOAL #1   Title  Patient will report 75% or greater improvement in her symptoms of dizziness and imbalance with provoking motions or positions    Baseline  Patient reports 100% improvement in her symptoms on 03/23/19    Time  8    Period  Weeks    Status  Achieved      PT LONG TERM GOAL #2   Title  Patient  will reduce falls risk as indicated by Activities Specific Balance Confidence Scale (ABC) >67%.    Baseline  scored 82.5% on 03/23/19    Time  8    Period  Weeks    Status  Achieved      PT LONG TERM GOAL #3   Title  Patient will demonstrate reduced falls risk as evidenced by Dynamic Gait Index (DGI) >19/24.    Baseline  scored 17/24 on 03/23/19    Time  8    Period  Weeks    Status  On-going      PT LONG TERM GOAL #4   Title  Patient reports no vertigo with provoking motions or positions.    Time  8    Period  Weeks    Status  Achieved      PT LONG TERM GOAL #5   Title  Pt will decrease 5TSTS by at least 3 seconds in order to demonstrate clinically significant improvement in LE strength.    Baseline  5xSTS 26.2 sec 04/07/2019    Time  8    Period  Weeks    Status  New    Target Date  06/02/19      Additional Long Term Goals   Additional Long Term Goals  Yes      PT LONG TERM GOAL #6   Title  Pt will increase Berg score to at least 50 to indicate a decrease in fall risk.    Baseline  Berg score 46/56    Time  8    Period  Weeks    Status  New    Target Date  06/02/19         Plan - 04/29/19 1224    Clinical Impression Statement  Moderate fatigue and shortness of breath with standing ther.ex./ walking tasks in //-bars.  Pt. requires short seated rest breaks to recover.  Pt. had 1 LOB with hallway walking (with SPC) with lean into wall on R (due to fatigue).  Pt. able to self-correct and return to 2-point gait in hallway.  Pt. maneuvers around PT clinic obstacles with min. cuing and focus on step pattern/ posture.  Pt. prefers forward head/ flexed posture in standing and walkng tasks.    Personal Factors and Comorbidities  Age;Comorbidity 3+    Comorbidities  MI, CAD, arthritis    Examination-Activity Limitations  Bend    Stability/Clinical Decision Making  Evolving/Moderate complexity    Clinical Decision Making  Moderate    Rehab Potential  Good    PT Frequency  1x /  week    PT Duration  8 weeks    PT Treatment/Interventions  Canalith Repostioning;ADLs/Self Care Home Management;Gait training;Stair training;Therapeutic activities;Therapeutic exercise;Balance training;Neuromuscular re-education;Patient/family education;Vestibular    PT Next Visit Plan  LE strengthening, outdoor walking.  CHECK GOALS/ RECERT    PT Home Exercise Plan  [id number]    Consulted and Agree with Plan of Care  Patient       Patient will benefit from skilled therapeutic intervention in order to improve the following deficits and impairments:  Decreased balance, Difficulty walking, Dizziness, Decreased strength  Visit Diagnosis: Difficulty in walking, not elsewhere classified  Muscle weakness (generalized)  Balance problem  Dizziness and giddiness  BPPV (benign paroxysmal positional vertigo), right     Problem List Patient Active Problem List   Diagnosis Date Noted  . Temporal arteritis ([place]) 04/07/2019  . Cardiac pacemaker 05/18/2014  . Atrial fibrillation ([place]) 11/27/2011  . Coronary artery disease 11/27/2011   Pura Spice, PT, DPT # [phone] 04/29/2019, 12:29 PM  [facility] [address] [place], [place], [address] Phone: [phone]   Fax:  [phone]  Name: Charlotte Glover MRN: [id number] Date of Birth: Mar 22, 1928

## 2019-05-04 DIAGNOSIS — Z13820 Encounter for screening for osteoporosis: Secondary | ICD-10-CM | POA: Insufficient documentation

## 2019-05-04 DIAGNOSIS — Z7952 Long term (current) use of systemic steroids: Secondary | ICD-10-CM | POA: Insufficient documentation

## 2019-05-05 ENCOUNTER — Encounter: Payer: Self-pay | Admitting: Physical Therapy

## 2019-05-05 ENCOUNTER — Ambulatory Visit: Payer: Medicare Other | Admitting: Physical Therapy

## 2019-05-05 DIAGNOSIS — M6281 Muscle weakness (generalized): Secondary | ICD-10-CM

## 2019-05-05 DIAGNOSIS — R2689 Other abnormalities of gait and mobility: Secondary | ICD-10-CM

## 2019-05-05 DIAGNOSIS — R262 Difficulty in walking, not elsewhere classified: Secondary | ICD-10-CM | POA: Diagnosis not present

## 2019-05-05 DIAGNOSIS — R42 Dizziness and giddiness: Secondary | ICD-10-CM

## 2019-05-05 NOTE — Therapy (Signed)
[facility] [address] [place], [place], [address] Phone: [phone]   Fax:  [phone]  Physical Therapy Treatment  Patient Details  Name: Charlotte Glover MRN: [id number] Date of Birth: 10-Mar-1928 Referring Provider (PT): Brett Fairy PA-C   Encounter Date: 05/05/2019  PT End of Session - 05/05/19 1324    Visit Number  9    Number of Visits  13    Date for PT Re-Evaluation  06/02/19    Authorization - Visit Number  1    Authorization - Number of Visits  10    PT Start Time  1026    PT Stop Time  1116    PT Time Calculation (min)  50 min    Activity Tolerance  Patient tolerated treatment well    Behavior During Therapy  [facility] for tasks assessed/performed       Past Medical History:  Diagnosis Date  . Arthritis   . Atrial fibrillation ([place])   . Breast cancer ([place]) 2015   DCIS  . Cancer ([place])   . Cardiac arrest ([place])   . Chickenpox   . Coronary artery disease   . Dyspnea   . Dysrhythmia   . Edema   . GERD (gastroesophageal reflux disease)   . Hearing aid worn    bilateral  . History of MI (myocardial infarction)   . HOH (hard of hearing)   . Myocardial infarction ([place]) 01/01/2013  . S/P ablation of atrial fibrillation   . Shingles     Past Surgical History:  Procedure Laterality Date  . ANTERIOR VITRECTOMY Left 03/20/2017   Procedure: ANTERIOR VITRECTOMY;  Surgeon: Leandrew Koyanagi, MD;  Location: [place];  Service: Ophthalmology;  Laterality: Left;  IVA TOPICAL LEFT  . ARTERY BIOPSY Right 04/08/2019   Procedure: BIOPSY TEMPORAL ARTERY;  Surgeon: Algernon Huxley, MD;  Location: [facility];  Service: Vascular;  Laterality: Right;  . ATRIAL ABLATION SURGERY    . BREAST BIOPSY Right 2015   + for DCIS   . BREAST SURGERY    . CARDIAC CATHETERIZATION    . CARDIAC CATHETERIZATION Bilateral 05/08/2016   Procedure: Right/Left Heart Cath and Coronary Angiography;  Surgeon: Isaias Cowman,  MD;  Location: [place] CV [facility];  Service: Cardiovascular;  Laterality: Bilateral;  . CATARACT EXTRACTION W/PHACO Left 03/20/2017   Procedure: IOL EXCHANGE;  Surgeon: Leandrew Koyanagi, MD;  Location: [place];  Service: Ophthalmology;  Laterality: Left;  . CHOLECYSTECTOMY    . DILATION AND CURETTAGE OF UTERUS    . ESOPHAGOGASTRODUODENOSCOPY    . PACEMAKER INSERTION    . PERIPHERAL IRIDOTOMY Left 03/20/2017   Procedure: PERIPHERAL IRIDECTOMY;  Surgeon: Leandrew Koyanagi, MD;  Location: [place];  Service: Ophthalmology;  Laterality: Left;  . ROTATOR CUFF REPAIR Left   . TEE WITHOUT CARDIOVERSION N/A 05/16/2016   Procedure: TRANSESOPHAGEAL ECHOCARDIOGRAM (TEE);  Surgeon: Teodoro Spray, MD;  Location: [facility];  Service: Cardiovascular;  Laterality: N/A;  . TONSILLECTOMY    . uterine polyp      There were no vitals filed for this visit.  Subjective Assessment - 05/05/19 1036    Subjective  Pt. reports no new complaints.  Pt. had a L side HA yesterday but no HA today.    Pertinent History  Patient reports she started having dizziness about a month ago when she would turn over in the morning. Patient reports she has dizziness and vertigo. Patient reports she was seen by ENT services  and that she had the Epley maneuver performed 3 times at the ENT office. Patient reports her symptoms got better for a while and now have returned. Patient states she thought she was all better, but states she one night recently she moved and experienced vertigo again. Patient she is cautious when she gets up in the morning and tries not to turn her head. Patient reports she has a fear of falling. Patient reports she has a bad right knee and that is why she uses the [facility]. Patient reports she has poor balance. Patient reports she has veered when she walks for a long time. Patient reports sometimes she will get dizziness when she is sitting still, but states when that occurs it is not as bad as  when she is moving. Patient reports she typically has episodes of sneezing fits where she will sneeze seventeen to twenty times in a row. Patient describes her dizziness as vertigo, unsteadiness, lightheadedness a few times and aural fullness. Patient reports that she experiences dizziness a few times a day and that the episodes last seconds. Patient's symptoms are provoked by bending over, rolling over and turning her head. Patient reports nothing makes her symptoms better. Patient reports she has not had difficulty with dizziness prior to one month age.    Patient Stated Goals  to have decreased dizziness and vertigo and improved balance    Currently in Pain?  No/denies         [facility] PT Assessment - 05/05/19 0001      Assessment   Medical Diagnosis  dizziness and giddiness    Referring Provider (PT)  Brett Fairy PA-C      Prior Function   Level of Independence  Independent with community mobility with device       Therapeutic Exercise:  Seated marching/ LAQ/ heel raises4# ankle weights-20x2each Marching in // bars forward/ backward/ 4# ankle weights - x5 Lateral walking 4# ankle weights - x5 Nustep B UE/LE L3 x 10 min.  Step touches (6" step): toe/ heel strike on L/R 20x2 Walking in hallway with use of SPC (2-point gait) with cuing to correct posture/ head position/ BOS Reviewed HEP   PT Short Term Goals - 03/23/19 0910      PT SHORT TERM GOAL #1   Title  Patient will report 50% or greater improvement in her symptoms of dizziness and imbalance with provoking motions or positions.    Baseline  100% on 03/23/19    Time  4    Period  Weeks    Status  Achieved    Target Date  03/27/19        PT Long Term Goals - 05/05/19 1332      PT LONG TERM GOAL #1   Title  Patient will report 75% or greater improvement in her symptoms of dizziness and imbalance with provoking motions or positions    Baseline  Patient reports 100% improvement in her symptoms on 03/23/19    Time   8    Period  Weeks    Status  Achieved      PT LONG TERM GOAL #2   Title  Patient will reduce falls risk as indicated by Activities Specific Balance Confidence Scale (ABC) >67%.    Baseline  scored 82.5% on 03/23/19    Time  8    Period  Weeks    Status  Achieved      PT LONG TERM GOAL #3   Title  Patient will demonstrate  reduced falls risk as evidenced by Dynamic Gait Index (DGI) >19/24.    Baseline  scored 17/24 on 03/23/19    Time  4    Period  Weeks    Status  On-going    Target Date  06/02/19      PT LONG TERM GOAL #4   Title  Patient reports no vertigo with provoking motions or positions.    Time  8    Period  Weeks    Status  Achieved      PT LONG TERM GOAL #5   Title  Pt will decrease 5TSTS by at least 3 seconds in order to demonstrate clinically significant improvement in LE strength.    Baseline  5xSTS 26.2 sec 04/07/2019    Time  8    Period  Weeks    Status  On-going    Target Date  06/02/19      PT LONG TERM GOAL #6   Title  Pt will increase Berg score to at least 50 to indicate a decrease in fall risk.    Baseline  Berg score 46/56    Time  4    Period  Weeks    Status  On-going    Target Date  06/02/19            Plan - 05/05/19 1325    Clinical Impression Statement  Pt. entered PT with use of SPC and reports increase shortness breath with increase activity today.  Pt. able to complete standing ther.ex. with proper technique and min. cuing to correct posture/ head position.  No LOB during tx. session but cuing to increase hip flexion/ step pattern/ heel strike.  Pt. reports no HA today and still working with MDs to determine cause of migraines.  Pt. will continue with daily walking program and ther.ex.    Personal Factors and Comorbidities  Age;Comorbidity 3+    Comorbidities  MI, CAD, arthritis    Examination-Activity Limitations  Bend    Stability/Clinical Decision Making  Evolving/Moderate complexity    Clinical Decision Making  Moderate    Rehab  Potential  Good    PT Frequency  1x / week    PT Duration  4 weeks    PT Treatment/Interventions  Canalith Repostioning;ADLs/Self Care Home Management;Gait training;Stair training;Therapeutic activities;Therapeutic exercise;Balance training;Neuromuscular re-education;Patient/family education;Vestibular    PT Next Visit Plan  LE strengthening, outdoor walking.    PT Home Exercise Plan  [id number]    Consulted and Agree with Plan of Care  Patient       Patient will benefit from skilled therapeutic intervention in order to improve the following deficits and impairments:  Decreased balance, Difficulty walking, Dizziness, Decreased strength  Visit Diagnosis: Difficulty in walking, not elsewhere classified  Muscle weakness (generalized)  Balance problem  Dizziness and giddiness     Problem List Patient Active Problem List   Diagnosis Date Noted  . Temporal arteritis ([place]) 04/07/2019  . Cardiac pacemaker 05/18/2014  . Atrial fibrillation ([place]) 11/27/2011  . Coronary artery disease 11/27/2011   Pura Spice, PT, DPT # [phone] 05/05/2019, 1:34 PM   [facility] [address] [place], [place], [address] Phone: [phone]   Fax:  [phone]  Name: Charlotte Glover MRN: [id number] Date of Birth: 08/06/1927

## 2019-05-06 ENCOUNTER — Other Ambulatory Visit: Payer: Self-pay | Admitting: Internal Medicine

## 2019-05-06 DIAGNOSIS — R519 Headache, unspecified: Secondary | ICD-10-CM

## 2019-05-12 ENCOUNTER — Other Ambulatory Visit: Payer: Self-pay

## 2019-05-12 ENCOUNTER — Encounter: Payer: Self-pay | Admitting: Physical Therapy

## 2019-05-12 ENCOUNTER — Ambulatory Visit: Payer: Medicare Other | Admitting: Physical Therapy

## 2019-05-12 DIAGNOSIS — R262 Difficulty in walking, not elsewhere classified: Secondary | ICD-10-CM | POA: Diagnosis not present

## 2019-05-12 DIAGNOSIS — R2689 Other abnormalities of gait and mobility: Secondary | ICD-10-CM

## 2019-05-12 DIAGNOSIS — M6281 Muscle weakness (generalized): Secondary | ICD-10-CM

## 2019-05-12 DIAGNOSIS — R42 Dizziness and giddiness: Secondary | ICD-10-CM

## 2019-05-12 NOTE — Therapy (Signed)
[facility] [address] [place], [place], [address] Phone: [phone]   Fax:  [phone]  Physical Therapy Treatment  Patient Details  Name: Charlotte Glover MRN: [id number] Date of Birth: 1927-11-29 Referring Provider (PT): Brett Fairy PA-C   Encounter Date: 05/12/2019  PT End of Session - 05/12/19 1223    Visit Number  10    Number of Visits  13    Date for PT Re-Evaluation  06/02/19    Authorization - Visit Number  2    Authorization - Number of Visits  10    PT Start Time  1025    PT Stop Time  1114    PT Time Calculation (min)  49 min    Activity Tolerance  Patient tolerated treatment well    Behavior During Therapy  [facility] for tasks assessed/performed       Past Medical History:  Diagnosis Date  . Arthritis   . Atrial fibrillation ([place])   . Breast cancer ([place]) 2015   DCIS  . Cancer ([place])   . Cardiac arrest ([place])   . Chickenpox   . Coronary artery disease   . Dyspnea   . Dysrhythmia   . Edema   . GERD (gastroesophageal reflux disease)   . Hearing aid worn    bilateral  . History of MI (myocardial infarction)   . HOH (hard of hearing)   . Myocardial infarction ([place]) 01/01/2013  . S/P ablation of atrial fibrillation   . Shingles     Past Surgical History:  Procedure Laterality Date  . ANTERIOR VITRECTOMY Left 03/20/2017   Procedure: ANTERIOR VITRECTOMY;  Surgeon: Leandrew Koyanagi, MD;  Location: [place];  Service: Ophthalmology;  Laterality: Left;  IVA TOPICAL LEFT  . ARTERY BIOPSY Right 04/08/2019   Procedure: BIOPSY TEMPORAL ARTERY;  Surgeon: Algernon Huxley, MD;  Location: [facility];  Service: Vascular;  Laterality: Right;  . ATRIAL ABLATION SURGERY    . BREAST BIOPSY Right 2015   + for DCIS   . BREAST SURGERY    . CARDIAC CATHETERIZATION    . CARDIAC CATHETERIZATION Bilateral 05/08/2016   Procedure: Right/Left Heart Cath and Coronary Angiography;  Surgeon: Isaias Cowman,  MD;  Location: [place] CV [facility];  Service: Cardiovascular;  Laterality: Bilateral;  . CATARACT EXTRACTION W/PHACO Left 03/20/2017   Procedure: IOL EXCHANGE;  Surgeon: Leandrew Koyanagi, MD;  Location: [place];  Service: Ophthalmology;  Laterality: Left;  . CHOLECYSTECTOMY    . DILATION AND CURETTAGE OF UTERUS    . ESOPHAGOGASTRODUODENOSCOPY    . PACEMAKER INSERTION    . PERIPHERAL IRIDOTOMY Left 03/20/2017   Procedure: PERIPHERAL IRIDECTOMY;  Surgeon: Leandrew Koyanagi, MD;  Location: [place];  Service: Ophthalmology;  Laterality: Left;  . ROTATOR CUFF REPAIR Left   . TEE WITHOUT CARDIOVERSION N/A 05/16/2016   Procedure: TRANSESOPHAGEAL ECHOCARDIOGRAM (TEE);  Surgeon: Teodoro Spray, MD;  Location: [facility];  Service: Cardiovascular;  Laterality: N/A;  . TONSILLECTOMY    . uterine polyp      There were no vitals filed for this visit.  Subjective Assessment - 05/12/19 1220    Subjective  Pt. states she has been doing alright.  Pt. will not be able to attend PT next week due to Thanksgiving week/ family in town but will return the following week.  No new complaints.    Pertinent History  Patient reports she started having dizziness about a month ago when she would turn over  in the morning. Patient reports she has dizziness and vertigo. Patient reports she was seen by ENT services and that she had the Epley maneuver performed 3 times at the ENT office. Patient reports her symptoms got better for a while and now have returned. Patient states she thought she was all better, but states she one night recently she moved and experienced vertigo again. Patient she is cautious when she gets up in the morning and tries not to turn her head. Patient reports she has a fear of falling. Patient reports she has a bad right knee and that is why she uses the [facility]. Patient reports she has poor balance. Patient reports she has veered when she walks for a long time. Patient reports  sometimes she will get dizziness when she is sitting still, but states when that occurs it is not as bad as when she is moving. Patient reports she typically has episodes of sneezing fits where she will sneeze seventeen to twenty times in a row. Patient describes her dizziness as vertigo, unsteadiness, lightheadedness a few times and aural fullness. Patient reports that she experiences dizziness a few times a day and that the episodes last seconds. Patient's symptoms are provoked by bending over, rolling over and turning her head. Patient reports nothing makes her symptoms better. Patient reports she has not had difficulty with dizziness prior to one month age.    Patient Stated Goals  to have decreased dizziness and vertigo and improved balance    Currently in Pain?  No/denies         Therapeutic Exercise:   Seated marching/ LAQ/ heel raises 5# ankle weights - 30x each Marching in // bars forward/ backward/ 5# ankle weights - x5 Lateral walking 5# ankle weights - x5 Step ups/down at stairs (B UE assist) Nustep B UE/LE L3 x 10 min.  Neuro:   Step touches (6" step): toe/ heel strike on L/R 20x2 Walking in clinic without use of SPC with cuing to correct posture/ head position/ BOS Walking over blue mat pad in //-bars without UE assist (forward/ backwards)- good heel clearance.   PT Short Term Goals - 03/23/19 0910      PT SHORT TERM GOAL #1   Title  Patient will report 50% or greater improvement in her symptoms of dizziness and imbalance with provoking motions or positions.    Baseline  100% on 03/23/19    Time  4    Period  Weeks    Status  Achieved    Target Date  03/27/19        PT Long Term Goals - 05/05/19 1332      PT LONG TERM GOAL #1   Title  Patient will report 75% or greater improvement in her symptoms of dizziness and imbalance with provoking motions or positions    Baseline  Patient reports 100% improvement in her symptoms on 03/23/19    Time  8    Period  Weeks     Status  Achieved      PT LONG TERM GOAL #2   Title  Patient will reduce falls risk as indicated by Activities Specific Balance Confidence Scale (ABC) >67%.    Baseline  scored 82.5% on 03/23/19    Time  8    Period  Weeks    Status  Achieved      PT LONG TERM GOAL #3   Title  Patient will demonstrate reduced falls risk as evidenced by Dynamic Gait Index (DGI) >19/24.  Baseline  scored 17/24 on 03/23/19    Time  4    Period  Weeks    Status  On-going    Target Date  06/02/19      PT LONG TERM GOAL #4   Title  Patient reports no vertigo with provoking motions or positions.    Time  8    Period  Weeks    Status  Achieved      PT LONG TERM GOAL #5   Title  Pt will decrease 5TSTS by at least 3 seconds in order to demonstrate clinically significant improvement in LE strength.    Baseline  5xSTS 26.2 sec 04/07/2019    Time  8    Period  Weeks    Status  On-going    Target Date  06/02/19      PT LONG TERM GOAL #6   Title  Pt will increase Berg score to at least 50 to indicate a decrease in fall risk.    Baseline  Berg score 46/56    Time  4    Period  Weeks    Status  On-going    Target Date  06/02/19            Plan - 05/12/19 1224    Clinical Impression Statement  Improved endurance with standing tolerance/ Nustep ex. with less shortness of breath.  Pt. able to ambulate over blue pad without UE assist but remains hesitant.  Pt. really hesitant with trying to walking on grassy/ uneven terrain.  Pt. benefits from elevated seat to complete resisted hip/knee ther.ex.  No change to HEP at this time.    Personal Factors and Comorbidities  Age;Comorbidity 3+    Comorbidities  MI, CAD, arthritis    Examination-Activity Limitations  Bend    Stability/Clinical Decision Making  Evolving/Moderate complexity    Clinical Decision Making  Moderate    Rehab Potential  Good    PT Frequency  1x / week    PT Duration  4 weeks    PT Treatment/Interventions  Canalith  Repostioning;ADLs/Self Care Home Management;Gait training;Stair training;Therapeutic activities;Therapeutic exercise;Balance training;Neuromuscular re-education;Patient/family education;Vestibular    PT Next Visit Plan  LE strengthening, outdoor walking.    PT Home Exercise Plan  [id number]    Consulted and Agree with Plan of Care  Patient       Patient will benefit from skilled therapeutic intervention in order to improve the following deficits and impairments:  Decreased balance, Difficulty walking, Dizziness, Decreased strength  Visit Diagnosis: Difficulty in walking, not elsewhere classified  Muscle weakness (generalized)  Balance problem  Dizziness and giddiness     Problem List Patient Active Problem List   Diagnosis Date Noted  . Temporal arteritis ([place]) 04/07/2019  . Cardiac pacemaker 05/18/2014  . Atrial fibrillation ([place]) 11/27/2011  . Coronary artery disease 11/27/2011   Pura Spice, PT, DPT # [phone] 05/12/2019, 12:30 PM  [facility] [address] [place], [place], [address] Phone: [phone]   Fax:  [phone]  Name: Charlotte Glover MRN: [id number] Date of Birth: April 29, 1928

## 2019-05-14 ENCOUNTER — Ambulatory Visit
Admission: RE | Admit: 2019-05-14 | Discharge: 2019-05-14 | Disposition: A | Discharge status: Home / Self Care | Payer: Medicare Other | Source: Ambulatory Visit | Attending: Internal Medicine | Admitting: Internal Medicine

## 2019-05-14 ENCOUNTER — Other Ambulatory Visit: Payer: Self-pay

## 2019-05-14 DIAGNOSIS — R519 Headache, unspecified: Secondary | ICD-10-CM | POA: Insufficient documentation

## 2019-05-14 HISTORY — DX: Heart failure, unspecified: I50.9

## 2019-05-14 MED ORDER — IOHEXOL 350 MG/ML SOLN
100.0000 mL | Freq: Once | INTRAVENOUS | Status: AC | PRN
  Administered 2019-05-14: 75 mL via INTRAVENOUS

## 2019-05-19 ENCOUNTER — Encounter: Payer: Medicare Other | Admitting: Physical Therapy

## 2019-05-26 ENCOUNTER — Ambulatory Visit: Payer: Medicare Other | Admitting: Physical Therapy

## 2019-05-29 ENCOUNTER — Other Ambulatory Visit: Payer: Self-pay

## 2019-05-29 ENCOUNTER — Ambulatory Visit (INDEPENDENT_AMBULATORY_CARE_PROVIDER_SITE_OTHER): Payer: Medicare Other | Admitting: Vascular Surgery

## 2019-05-29 ENCOUNTER — Encounter (INDEPENDENT_AMBULATORY_CARE_PROVIDER_SITE_OTHER): Payer: Self-pay | Admitting: Vascular Surgery

## 2019-05-29 DIAGNOSIS — R519 Headache, unspecified: Secondary | ICD-10-CM | POA: Diagnosis not present

## 2019-05-29 DIAGNOSIS — G8929 Other chronic pain: Secondary | ICD-10-CM | POA: Diagnosis not present

## 2019-05-29 DIAGNOSIS — I6521 Occlusion and stenosis of right carotid artery: Secondary | ICD-10-CM

## 2019-05-29 NOTE — Progress Notes (Signed)
MRN : [id number]  Charlotte Glover is a 85 y.o. (1928-01-10) female who presents with chief complaint of  Chief Complaint  Patient presents with   Follow-up    ref [place],ica stenosis  .  History of Present Illness: Patient returns today in follow up of her headaches.  About 2 months ago, she underwent a temporal artery biopsy which was unrevealing for temporal arteritis.  She continues to be bothered by severe headaches.  She recently saw an ophthalmologist who thinks she may have had a stroke which would explain some of her visual changes and headaches.  She recently underwent a CT angiogram which I have independently reviewed and have gone over with the patient today.  Her cervical carotid and vertebral arteries are widely patent although somewhat tortuous.  She does have some intracranial right carotid stenosis that appears to be at least moderately significant.  We discussed with the patient today that we do not treat intracranial disease as that is a low likelihood of cause of a stroke and would be very unlikely to be the cause of her headaches.  She does have a 1.3 cm extra-axial mass that is felt to likely be a meningioma.  Current Outpatient Medications  Medication Sig Dispense Refill   Acetaminophen 500 MG coapsule Take by mouth.     azelastine (ASTELIN) 0.1 % nasal spray Place into both nostrils 2 (two) times daily. Use in each nostril as directed     calcium carbonate (TUMS - DOSED IN MG ELEMENTAL CALCIUM) 500 MG chewable tablet Chew 1-2 tablets by mouth daily as needed for indigestion or heartburn.     esomeprazole (NEXIUM) 20 MG capsule Take 20 mg by mouth 2 (two) times daily before a meal.     furosemide (LASIX) 40 MG tablet Take 40 mg by mouth 2 (two) times daily.   3   gabapentin (NEURONTIN) 300 MG capsule Take 1,200-1,500 mg by mouth 2 (two) times daily. Take 1500mg  in the morning and 1200mg  at night     losartan (COZAAR) 25 MG tablet Take 25 mg by mouth daily.      metolazone (ZAROXOLYN) 5 MG tablet Take 5 mg by mouth daily as needed.     Multiple Vitamin (MULTIVITAMIN) tablet Take 1 tablet by mouth daily.     Multiple Vitamins-Minerals (PRESERVISION AREDS 2) CAPS Take 1 capsule by mouth 2 (two) times daily.     naproxen sodium (ANAPROX) 220 MG tablet Take 220 mg by mouth as needed.     nitroGLYCERIN (NITROSTAT) 0.4 MG SL tablet Place 0.4 mg under the tongue every 5 (five) minutes x 2 doses as needed for chest pain.      predniSONE (DELTASONE) 20 MG tablet Take 5 mg by mouth daily with breakfast.      rivaroxaban (XARELTO) 20 MG TABS tablet Take 20 mg by mouth daily after breakfast.     senna (SENOKOT) 8.6 MG TABS tablet Take 1 tablet by mouth daily as needed for mild constipation.     triamcinolone ointment (KENALOG) 0.1 % Apply 1 application topically 2 (two) times daily.     gabapentin (NEURONTIN) 300 MG capsule Take by mouth.     nortriptyline (PAMELOR) 10 MG capsule Take 10 mg by mouth every evening.  3   nortriptyline (PAMELOR) 50 MG capsule Take by mouth.     omeprazole (PRILOSEC) 20 MG capsule Take 20 mg by mouth 2 (two) times daily before a meal.     omeprazole (PRILOSEC) 20 MG  capsule TAKE 1 CAPSULE(20 MG) BY MOUTH TWICE DAILY     Polyvinyl Alcohol-Povidone (REFRESH OP) Apply 1 drop to eye daily as needed (dry eyes).     potassium chloride SA (K-DUR) 20 MEQ tablet Take 1 tablet (20 mEq total) by mouth 2 (two) times daily for 5 days. 10 tablet 0   warfarin (COUMADIN) 5 MG tablet Take 5 mg by mouth daily.     No current facility-administered medications for this visit.     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   Cancer ([place])    Cardiac arrest ([place])    CHF (congestive heart failure) ([facility])    Chickenpox    Coronary artery disease    Dyspnea    Dysrhythmia    Edema    GERD (gastroesophageal reflux disease)    Hearing aid worn      bilateral   History of MI (myocardial infarction)    HOH (hard of hearing)    Hypertension 01/23/2012   Myocardial infarction ([place]) 01/01/2013   S/P ablation of atrial fibrillation    Shingles     Past Surgical History:  Procedure Laterality Date   ANTERIOR VITRECTOMY Left 03/20/2017   Procedure: ANTERIOR VITRECTOMY;  Surgeon: Leandrew Koyanagi, MD;  Location: [facility];  Service: Ophthalmology;  Laterality: Left;  IVA TOPICAL LEFT   ARTERY BIOPSY Right 04/08/2019   Procedure: BIOPSY TEMPORAL ARTERY;  Surgeon: Algernon Huxley, MD;  Location: [facility];  Service: Vascular;  Laterality: Right;   ATRIAL ABLATION SURGERY     BREAST BIOPSY Right 2015   + for DCIS    BREAST SURGERY     CARDIAC CATHETERIZATION     CARDIAC CATHETERIZATION Bilateral 05/08/2016   Procedure: Right/Left Heart Cath and Coronary Angiography;  Surgeon: Isaias Cowman, MD;  Location: [facility];  Service: Cardiovascular;  Laterality: Bilateral;   CATARACT EXTRACTION W/PHACO Left 03/20/2017   Procedure: IOL EXCHANGE;  Surgeon: Leandrew Koyanagi, MD;  Location: [facility];  Service: Ophthalmology;  Laterality: Left;   CHOLECYSTECTOMY     DILATION AND CURETTAGE OF UTERUS     ESOPHAGOGASTRODUODENOSCOPY     PACEMAKER INSERTION     PERIPHERAL IRIDOTOMY Left 03/20/2017   Procedure: PERIPHERAL IRIDECTOMY;  Surgeon: Leandrew Koyanagi, MD;  Location: [facility];  Service: Ophthalmology;  Laterality: Left;   ROTATOR CUFF REPAIR Left    TEE WITHOUT CARDIOVERSION N/A 05/16/2016   Procedure: TRANSESOPHAGEAL ECHOCARDIOGRAM (TEE);  Surgeon: Teodoro Spray, MD;  Location: [facility];  Service: Cardiovascular;  Laterality: N/A;   TONSILLECTOMY     uterine polyp       Social History   Tobacco Use   Smoking status: Never Smoker   Smokeless tobacco: Never Used  Substance Use Topics   Alcohol use: Yes    Alcohol/week: 7.0 standard drinks    Types: 7  Shots of liquor per week    Comment:  (1 scotch daily)   Drug use: No    Family History  Problem Relation Age of Onset   Breast cancer Mother    Cancer Sister    Cancer Brother    Cancer Daughter    Breast cancer Daughter    Cancer Son     Allergies  Allergen Reactions   Amiodarone Other (See Comments)    Bradycardia and sinus pauses   Baclofen Other (See Comments)    Double vision  Metoprolol Rash   Pantoprazole Rash   Tape Rash    REVIEW OF SYSTEMS (Negative unless checked)  Constitutional: [] ?Weight loss  [] ?Fever  [] ?Chills Cardiac: [] ?Chest pain   [] ?Chest pressure   [] ?Palpitations   [] ?Shortness of breath when laying flat   [] ?Shortness of breath at rest   [] ?Shortness of breath with exertion. Vascular:  [] ?Pain in legs with walking   [] ?Pain in legs at rest   [] ?Pain in legs when laying flat   [] ?Claudication   [] ?Pain in feet when walking  [] ?Pain in feet at rest  [] ?Pain in feet when laying flat   [] ?History of DVT   [] ?Phlebitis   [] ?Swelling in legs   [] ?Varicose veins   [] ?Non-healing ulcers Pulmonary:   [] ?Uses home oxygen   [] ?Productive cough   [] ?Hemoptysis   [] ?Wheeze  [] ?COPD   [] ?Asthma Neurologic:  [] ?Dizziness  [] ?Blackouts   [] ?Seizures   [] ?History of stroke   [] ?History of TIA  [] ?Aphasia   [] ?Temporary blindness   [] ?Dysphagia   [] ?Weakness or numbness in arms   [] ?Weakness or numbness in legs X positive for headaches Musculoskeletal:  [x] ?Arthritis   [] ?Joint swelling   [x] ?Joint pain   [] ?Low back pain Hematologic:  [] ?Easy bruising  [] ?Easy bleeding   [] ?Hypercoagulable state   [] ?Anemic  [] ?Hepatitis Gastrointestinal:  [] ?Blood in stool   [] ?Vomiting blood  [] ?Gastroesophageal reflux/heartburn   [] ?Abdominal pain Genitourinary:  [] ?Chronic kidney disease   [] ?Difficult urination  [] ?Frequent urination  [] ?Burning with urination   [] ?Hematuria Skin:  [] ?Rashes   [] ?Ulcers   [] ?Wounds Psychological:  [] ?History of anxiety   [] ? History  of major depression.  Physical Examination  BP 134/78 (BP Location: Right Arm)    Pulse 90    Resp 16    Wt 183 lb (83 kg)    BMI 29.54 kg/m  Gen:  WD/WN, NAD.  Appears younger than stated age Head: [place]/AT, No temporalis wasting. Ear/Nose/Throat: Hearing diminished, nares w/o erythema or drainage Eyes: Conjunctiva clear. Sclera non-icteric Neck: Supple.  Trachea midline Pulmonary:  Good air movement, no use of accessory muscles.  Cardiac: RRR, no JVD Vascular:  Vessel Right Left  Radial Palpable Palpable                       Musculoskeletal: M/S 5/5 throughout.  No deformity or atrophy.  No lower extremity edema. Neurologic: Sensation grossly intact in extremities.  Symmetrical.  Speech is fluent.  Psychiatric: Judgment intact, Mood & affect appropriate for pt's clinical situation. Dermatologic: No rashes or ulcers noted.  No cellulitis or open wounds.       Labs Recent Results (from the past 2160 hour(s))  SARS CORONAVIRUS 2 (TAT 6-24 HRS) Nasopharyngeal Nasopharyngeal Swab     Status: None   Collection Time: 04/07/19  3:08 PM   Specimen: Nasopharyngeal Swab  Result Value Ref Range   SARS Coronavirus 2 NEGATIVE NEGATIVE    Comment: (NOTE) SARS-CoV-2 target nucleic acids are NOT DETECTED. The SARS-CoV-2 RNA is generally detectable in upper and lower respiratory specimens during the acute phase of infection. Negative results do not preclude SARS-CoV-2 infection, do not rule out co-infections with other pathogens, and should not be used as the sole basis for treatment or other patient management decisions. Negative results must be combined with clinical observations, patient history, and epidemiological information. The expected result is Negative. Fact Sheet for Patients: SugarRoll.be Fact Sheet for Healthcare Providers: https://www.woods-mathews.com/ This test is not yet approved or cleared by the [place]  States FDA and  has  been authorized for detection and/or diagnosis of SARS-CoV-2 by FDA under an Emergency Use Authorization (EUA). This EUA will remain  in effect (meaning this test can be used) for the duration of the COVID-19 declaration under Section 56 4(b)(1) of the Act, 21 U.S.C. section 360bbb-3(b)(1), unless the authorization is terminated or revoked sooner. Performed at [facility], [place] [address] [place], [place] [address]   Type and screen     Status: None   Collection Time: 04/08/19 10:36 AM  Result Value Ref Range   ABO/RH(D) A POS    Antibody Screen NEG    Sample Expiration      04/11/2019,2359 Performed at [facility], [address] [place], [place] [address]   Comprehensive metabolic panel     Status: Abnormal   Collection Time: 04/08/19 10:42 AM  Result Value Ref Range   Sodium 138 135 - 145 mmol/L   Potassium 2.9 (L) 3.5 - 5.1 mmol/L   Chloride 95 (L) 98 - 111 mmol/L   CO2 29 22 - 32 mmol/L   Glucose, Bld 115 (H) 70 - 99 mg/dL   BUN 33 (H) 8 - 23 mg/dL   Creatinine, Ser 1.13 (H) 0.44 - 1.00 mg/dL   Calcium 9.3 8.9 - 10.3 mg/dL   Total Protein 6.2 (L) 6.5 - 8.1 g/dL   Albumin 3.5 3.5 - 5.0 g/dL   AST 26 15 - 41 U/L   ALT 30 0 - 44 U/L   Alkaline Phosphatase 74 38 - 126 U/L   Total Bilirubin 1.0 0.3 - 1.2 mg/dL   GFR calc non Af Amer 43 (L) >60 mL/min   GFR calc Af Amer 50 (L) >60 mL/min   Anion gap 14 5 - 15    Comment: Performed at [facility], [address] [place], [place] [address]  CBC     Status: Abnormal   Collection Time: 04/08/19 10:42 AM  Result Value Ref Range   WBC 10.1 4.0 - 10.5 K/uL   RBC 4.43 3.87 - 5.11 MIL/uL   Hemoglobin 13.3 12.0 - 15.0 g/dL   HCT 39.9 36.0 - 46.0 %   MCV 90.1 80.0 - 100.0 fL   MCH 30.0 26.0 - 34.0 pg   MCHC 33.3 30.0 - 36.0 g/dL   RDW 15.7 (H) 11.5 - 15.5 %   Platelets 253 150 - 400 K/uL   nRBC 0.0 0.0 - 0.2 %    Comment: Performed at [facility], [place], [place]  [address]  PT- INR Day of Surgery     Status: Abnormal   Collection Time: 04/08/19 10:42 AM  Result Value Ref Range   Prothrombin Time 15.3 (H) 11.4 - 15.2 seconds   INR 1.2 0.8 - 1.2    Comment: (NOTE) INR goal varies based on device and disease states. Performed at [facility], [address] [place], [place] [address]   APTT     Status: None   Collection Time: 04/08/19 10:42 AM  Result Value Ref Range   aPTT 31 24 - 36 seconds    Comment: Performed at [facility], [address] [place], [place] [address]  ABO/Rh     Status: None   Collection Time: 04/08/19 10:42 AM  Result Value Ref Range   ABO/RH(D)      A POS Performed at [facility], [address] [place],  [address]   Surgical pathology  Status: None   Collection Time: 04/08/19  2:23 PM  Result Value Ref Range   SURGICAL PATHOLOGY      SURGICAL PATHOLOGY CASE: [id number] PATIENT: Su Hilt Surgical Pathology Report     Specimen Submitted: A. Temporal artery, right  Clinical History: Temporal arteritis    DIAGNOSIS: A. TEMPORAL ARTERY, RIGHT; BIOPSY: - ARTERIAL TISSUE WITH MILD INTIMAL HYPERPLASIA. - NEGATIVE FOR GIANT CELL ARTERITIS.  Comment: The submitted arterial tissue was examined over 190 histologic levels  GROSS DESCRIPTION: A. Labeled: Right temporal artery Received: In formalin Tissue fragment(s): 1 Size: 2.3 cm in length and 0.1 cm in diameter Description: Vascular segment Entirely submitted in 2 cassettes.   Final Diagnosis performed by Allena Napoleon, MD.   Electronically signed 04/10/2019 8:33:33AM The electronic signature indicates that the named Attending Pathologist has evaluated the specimen Technical component performed at [facility], [address], [place], [place] [address] Lab: [phone] Dir: Rush Farmer, MD, MMM  Professional component performed at [place], [facility], [place], [place],  [place] [address] Lab: [phone] Dir: Dellia Nims. Reuel Derby, MD     Radiology Ct Angio Head W Or Wo Contrast  Result Date: 05/14/2019 CLINICAL DATA:  Chronic headaches since 08/2017 located in the frontal and temporal regions. History of breast cancer. EXAM: CT ANGIOGRAPHY HEAD AND NECK TECHNIQUE: Multidetector CT imaging of the head and neck was performed using the standard protocol during bolus administration of intravenous contrast. Multiplanar CT image reconstructions and MIPs were obtained to evaluate the vascular anatomy. Carotid stenosis measurements (when applicable) are obtained utilizing NASCET criteria, using the distal internal carotid diameter as the denominator. CONTRAST:  11mL OMNIPAQUE IOHEXOL 350 MG/ML SOLN COMPARISON:  Head MRI 12/25/2018 and CT 12/01/2018. Chest CT 01/09/2019. FINDINGS: CT HEAD FINDINGS Brain: There is no evidence of acute infarct, intracranial hemorrhage, midline shift, or extra-axial fluid collection. The ventricles and sulci are within normal limits for age. A 9 mm focus of extra-axial calcification over the posterior right frontal convexity near the vertex is unchanged and may represent focal dural calcification or an inconsequential small meningioma. There is a 1.3 cm rounded midline extra-axial mass in the posterior fossa posterior and inferior to the cerebellar vermis which is slightly hyperattenuating to cerebellum on CT and which is largely isointense to cerebellum on MRI. There is no significant mass effect or brain edema. Vascular: Calcified atherosclerosis at the skull base. No hyperdense vessel. Skull: No fracture or suspicious osseous lesion. Sinuses: Paranasal sinuses and mastoid air cells are clear. Orbits: Bilateral cataract extraction. Review of the MIP images confirms the above findings CTA NECK FINDINGS Aortic arch: Incomplete imaging of the aortic arch. Standard 3 vessel arch configuration with widely patent arch vessel origins. Right carotid system: Patent and  smooth without evidence of stenosis or dissection. Tortuous proximal ICA. Left carotid system: Patent and smooth without evidence of stenosis or dissection. Tortuous distal cervical ICA. Vertebral arteries: Patent and codominant. No significant stenosis or dissection is identified, however venous contrast limits assessment of the left V1 segment. Skeleton: Moderately advanced cervical disc and facet degeneration. Facet and interbody ankylosis at C2-3 and C4-5. Other neck: No evidence of cervical lymphadenopathy or mass. Upper chest: Peripheral ground-glass opacity in the left upper lobe, unchanged from the prior chest CT. Review of the MIP images confirms the above findings CTA HEAD FINDINGS Anterior circulation: The internal carotid arteries are patent from skull base to carotid termini without evidence of significant stenosis on the left, however there is a severe proximal  right supraclinoid ICA stenosis. The left cavernous ICA is mildly ectatic. ACAs and MCAs are patent without evidence of proximal branch occlusion or significant proximal stenosis. The right A1 segment is hypoplastic. No aneurysm is identified. Posterior circulation: The intracranial vertebral arteries are widely patent to the basilar. Patent PICA, AICA, and SCA origins are seen bilaterally. The basilar artery is widely patent. Posterior communicating arteries are diminutive or absent. Both PCAs are patent without evidence of significant proximal stenosis. No aneurysm is identified. Venous sinuses: Patent. Anatomic variants: Hypoplastic right A1. Review of the MIP images confirms the above findings IMPRESSION: 1. Severe right supraclinoid ICA stenosis. 2. Widely patent cervical carotid and vertebral arteries. 3. No evidence of acute intracranial abnormality. 4. 1.3 cm extra-axial mass posterior and inferior to the cerebellar vermis, possibly a meningioma or subependymoma. No significant mass effect or brain edema. Consider obtaining a  contrast-enhanced brain MRI for further evaluation. Electronically Signed   By: Logan Bores M.D.   On: 05/14/2019 16:30   Ct Angio Neck W Or Wo Contrast  Result Date: 05/14/2019 CLINICAL DATA:  Chronic headaches since 08/2017 located in the frontal and temporal regions. History of breast cancer. EXAM: CT ANGIOGRAPHY HEAD AND NECK TECHNIQUE: Multidetector CT imaging of the head and neck was performed using the standard protocol during bolus administration of intravenous contrast. Multiplanar CT image reconstructions and MIPs were obtained to evaluate the vascular anatomy. Carotid stenosis measurements (when applicable) are obtained utilizing NASCET criteria, using the distal internal carotid diameter as the denominator. CONTRAST:  31mL OMNIPAQUE IOHEXOL 350 MG/ML SOLN COMPARISON:  Head MRI 12/25/2018 and CT 12/01/2018. Chest CT 01/09/2019. FINDINGS: CT HEAD FINDINGS Brain: There is no evidence of acute infarct, intracranial hemorrhage, midline shift, or extra-axial fluid collection. The ventricles and sulci are within normal limits for age. A 9 mm focus of extra-axial calcification over the posterior right frontal convexity near the vertex is unchanged and may represent focal dural calcification or an inconsequential small meningioma. There is a 1.3 cm rounded midline extra-axial mass in the posterior fossa posterior and inferior to the cerebellar vermis which is slightly hyperattenuating to cerebellum on CT and which is largely isointense to cerebellum on MRI. There is no significant mass effect or brain edema. Vascular: Calcified atherosclerosis at the skull base. No hyperdense vessel. Skull: No fracture or suspicious osseous lesion. Sinuses: Paranasal sinuses and mastoid air cells are clear. Orbits: Bilateral cataract extraction. Review of the MIP images confirms the above findings CTA NECK FINDINGS Aortic arch: Incomplete imaging of the aortic arch. Standard 3 vessel arch configuration with widely patent  arch vessel origins. Right carotid system: Patent and smooth without evidence of stenosis or dissection. Tortuous proximal ICA. Left carotid system: Patent and smooth without evidence of stenosis or dissection. Tortuous distal cervical ICA. Vertebral arteries: Patent and codominant. No significant stenosis or dissection is identified, however venous contrast limits assessment of the left V1 segment. Skeleton: Moderately advanced cervical disc and facet degeneration. Facet and interbody ankylosis at C2-3 and C4-5. Other neck: No evidence of cervical lymphadenopathy or mass. Upper chest: Peripheral ground-glass opacity in the left upper lobe, unchanged from the prior chest CT. Review of the MIP images confirms the above findings CTA HEAD FINDINGS Anterior circulation: The internal carotid arteries are patent from skull base to carotid termini without evidence of significant stenosis on the left, however there is a severe proximal right supraclinoid ICA stenosis. The left cavernous ICA is mildly ectatic. ACAs and MCAs are patent without evidence of  proximal branch occlusion or significant proximal stenosis. The right A1 segment is hypoplastic. No aneurysm is identified. Posterior circulation: The intracranial vertebral arteries are widely patent to the basilar. Patent PICA, AICA, and SCA origins are seen bilaterally. The basilar artery is widely patent. Posterior communicating arteries are diminutive or absent. Both PCAs are patent without evidence of significant proximal stenosis. No aneurysm is identified. Venous sinuses: Patent. Anatomic variants: Hypoplastic right A1. Review of the MIP images confirms the above findings IMPRESSION: 1. Severe right supraclinoid ICA stenosis. 2. Widely patent cervical carotid and vertebral arteries. 3. No evidence of acute intracranial abnormality. 4. 1.3 cm extra-axial mass posterior and inferior to the cerebellar vermis, possibly a meningioma or subependymoma. No significant mass  effect or brain edema. Consider obtaining a contrast-enhanced brain MRI for further evaluation. Electronically Signed   By: Logan Bores M.D.   On: 05/14/2019 16:30    Assessment/Plan Coronary artery disease Continue cardiac and antihypertensive medications as already ordered and reviewed, no changes at this time. Continue statin as ordered and reviewed, no changes at this time Nitrates PRN for chest pain   Atrial fibrillation Rate controlled.  On anticoagulation.   Intracranial carotid stenosis, right CT angiogram which I have independently reviewed and have gone over with the patient today.  Her cervical carotid and vertebral arteries are widely patent although somewhat tortuous.  She does have some intracranial right carotid stenosis that appears to be at least moderately significant.  We discussed with the patient today that we do not treat intracranial disease as that is a low likelihood of cause of a stroke and would be very unlikely to be the cause of her headaches.     Chronic headaches CT angiogram which I have independently reviewed and have gone over with the patient today.  Her cervical carotid and vertebral arteries are widely patent although somewhat tortuous.  She does have some intracranial right carotid stenosis that appears to be at least moderately significant.  We discussed with the patient today that we do not treat intracranial disease as that is a low likelihood of cause of a stroke and would be very unlikely to be the cause of her headaches.  She does have a 1.3 cm extra-axial mass that is felt to likely be a meningioma.  I am not sure if the meningioma may be the cause of the headache, but it is very unlikely to be due to her intracranial carotid disease.  There is no treatment recommended for her intracranial carotid disease.  I will defer further work-up to her primary care physician.  I will see her back as needed.      Leotis Pain, MD  05/29/2019 12:22  PM    This note was created with Dragon medical transcription system.  Any errors from dictation are purely unintentional

## 2019-05-29 NOTE — Assessment & Plan Note (Signed)
CT angiogram which I have independently reviewed and have gone over with the patient today.  Her cervical carotid and vertebral arteries are widely patent although somewhat tortuous.  She does have some intracranial right carotid stenosis that appears to be at least moderately significant.  We discussed with the patient today that we do not treat intracranial disease as that is a low likelihood of cause of a stroke and would be very unlikely to be the cause of her headaches.  She does have a 1.3 cm extra-axial mass that is felt to likely be a meningioma.  I am not sure if the meningioma may be the cause of the headache, but it is very unlikely to be due to her intracranial carotid disease.  There is no treatment recommended for her intracranial carotid disease.  I will defer further work-up to her primary care physician.  I will see her back as needed.

## 2019-05-29 NOTE — Assessment & Plan Note (Signed)
CT angiogram which I have independently reviewed and have gone over with the patient today.  Her cervical carotid and vertebral arteries are widely patent although somewhat tortuous.  She does have some intracranial right carotid stenosis that appears to be at least moderately significant.  We discussed with the patient today that we do not treat intracranial disease as that is a low likelihood of cause of a stroke and would be very unlikely to be the cause of her headaches.

## 2019-06-02 ENCOUNTER — Encounter: Payer: Medicare Other | Admitting: Physical Therapy

## 2019-07-14 ENCOUNTER — Ambulatory Visit (HOSPITAL_COMMUNITY)
Admission: RE | Admit: 2019-07-14 | Discharge: 2019-07-14 | Disposition: A | Discharge status: Home / Self Care | Payer: Medicare Other | Source: Ambulatory Visit | Attending: Neurology | Admitting: Neurology

## 2019-07-14 ENCOUNTER — Other Ambulatory Visit: Payer: Self-pay

## 2019-07-14 ENCOUNTER — Other Ambulatory Visit (HOSPITAL_COMMUNITY): Payer: Self-pay | Admitting: Neurology

## 2019-07-14 DIAGNOSIS — R2990 Unspecified symptoms and signs involving the nervous system: Secondary | ICD-10-CM | POA: Diagnosis present

## 2019-07-14 DIAGNOSIS — G44211 Episodic tension-type headache, intractable: Secondary | ICD-10-CM | POA: Insufficient documentation

## 2019-07-14 DIAGNOSIS — D329 Benign neoplasm of meninges, unspecified: Secondary | ICD-10-CM | POA: Diagnosis present

## 2019-07-14 DIAGNOSIS — D429 Neoplasm of uncertain behavior of meninges, unspecified: Secondary | ICD-10-CM

## 2019-07-14 DIAGNOSIS — H532 Diplopia: Secondary | ICD-10-CM | POA: Insufficient documentation

## 2019-07-14 HISTORY — DX: Neoplasm of uncertain behavior of meninges, unspecified: D42.9

## 2019-07-14 MED ORDER — GADOBUTROL 1 MMOL/ML IV SOLN
7.5000 mL | Freq: Once | INTRAVENOUS | Status: AC | PRN
  Administered 2019-07-14: 7.5 mL via INTRAVENOUS

## 2019-07-18 IMAGING — CT CT CHEST WITHOUT CONTRAST
1 series · 15 of 34 positions shown, 19 images · non-contrast
Comparison: January 02, 2018

CLINICAL DATA: Follow-up lung nodules

EXAM:
CT CHEST WITHOUT CONTRAST
TECHNIQUE: Multidetector CT imaging of the chest was performed following the
standard protocol without IV contrast.

[Series 2: thorax (person_name) · axial · 0.68mm/px · z∈[-385,-109]mm · 15 of 162 slices shown, 19 images]
[im 12/162  mediastinal]
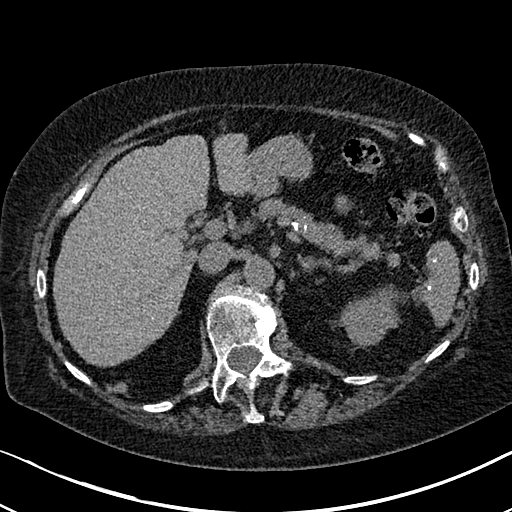
[im 12/162  lung]
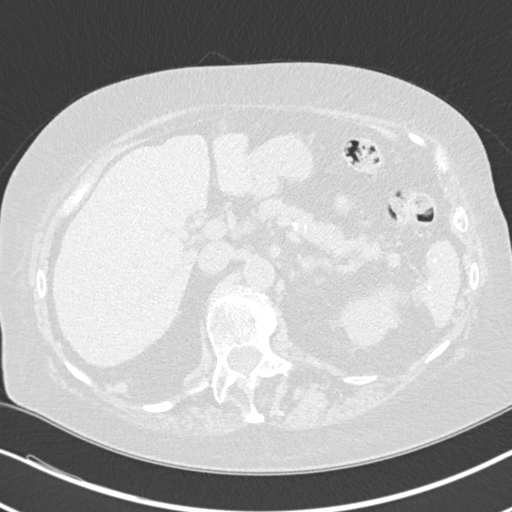
[im 24/162  lung]
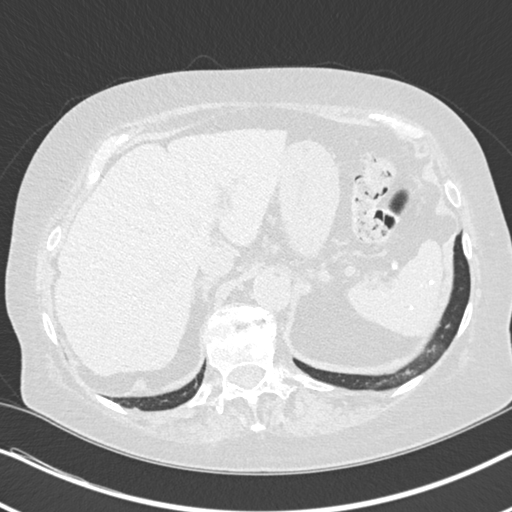
[im 33/162  lung]
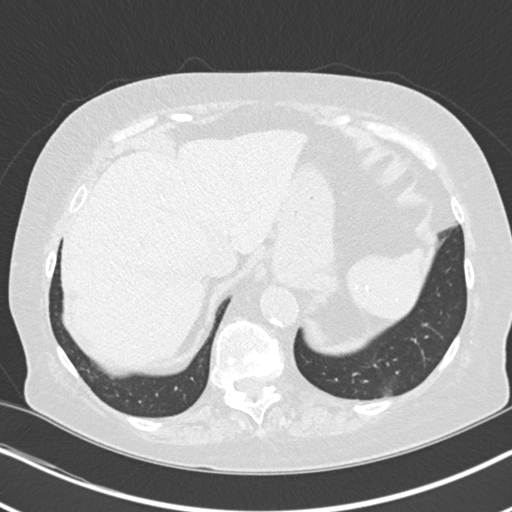
[im 42/162  lung]
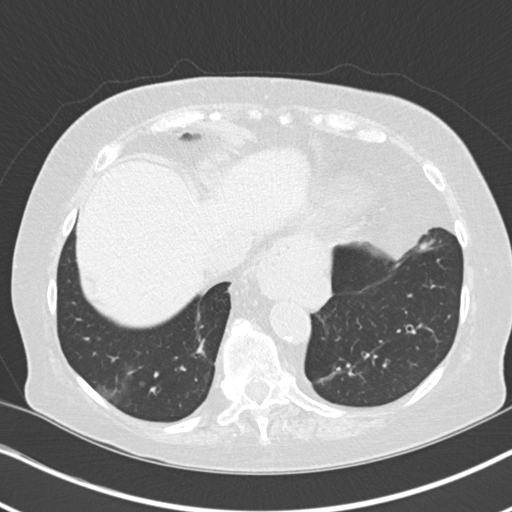
[im 54/162  mediastinal]
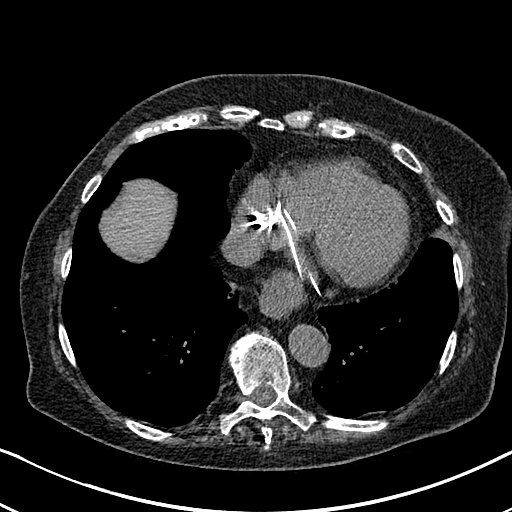
[im 54/162  lung]
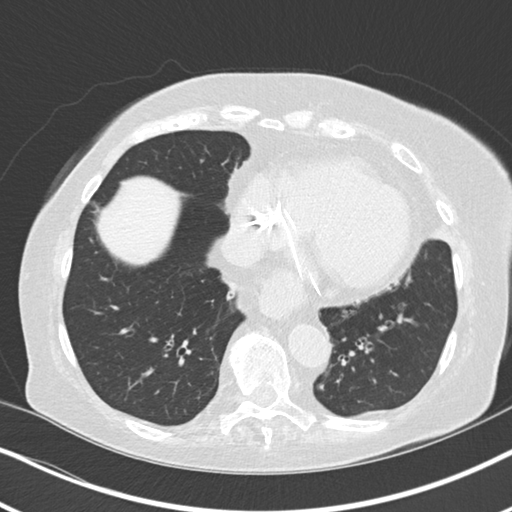
[im 65/162  lung]
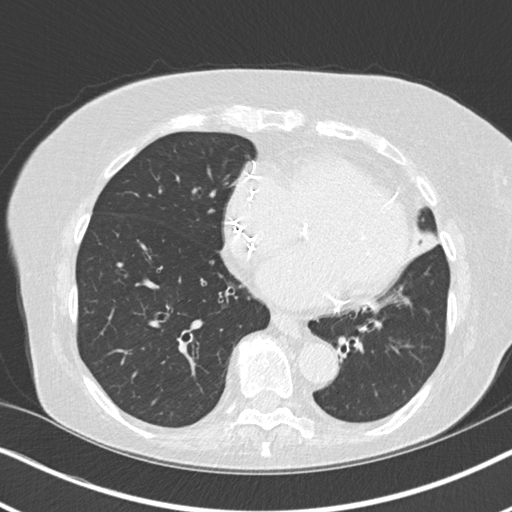
[im 72/162  lung]
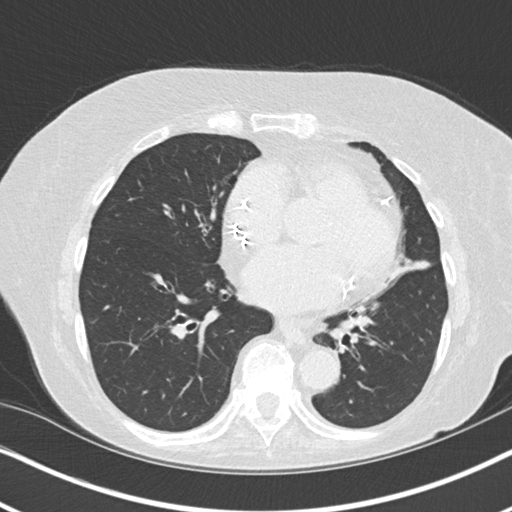
[im 84/162  lung]
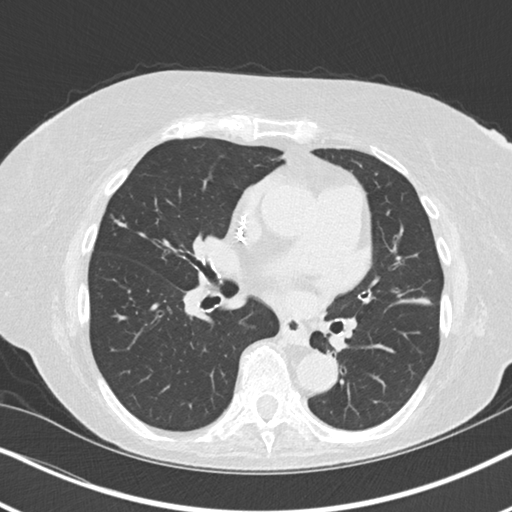
[im 90/162  mediastinal]
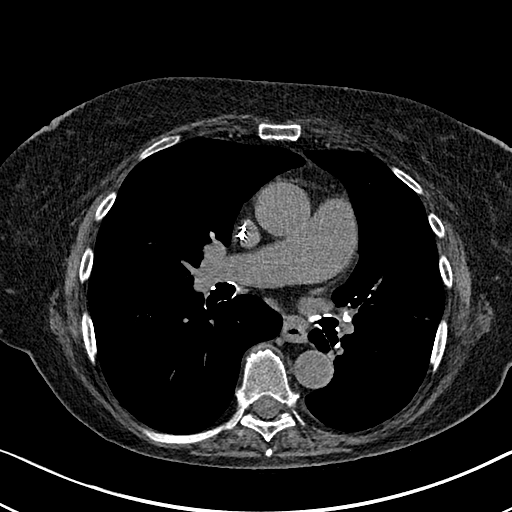
[im 90/162  lung]
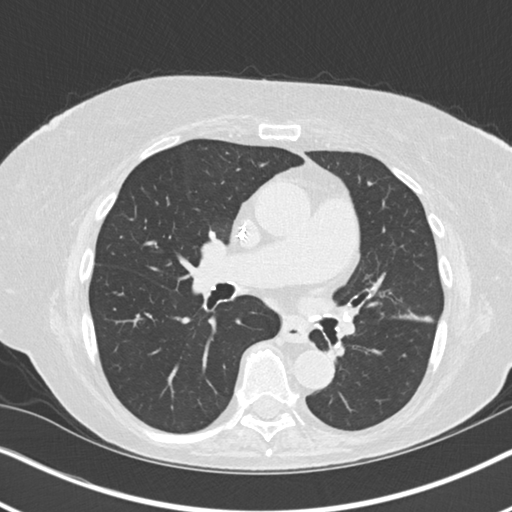
[im 97/162  lung]
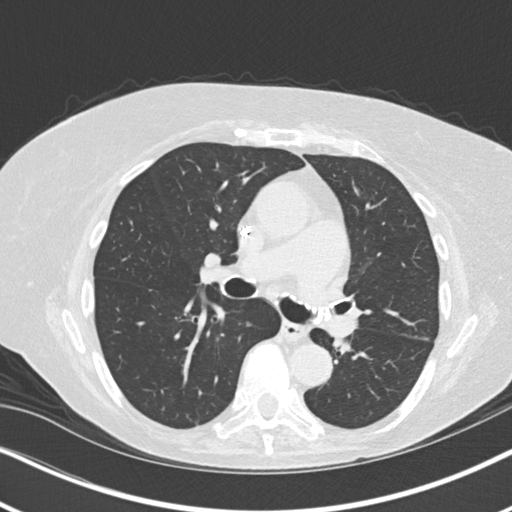
[im 108/162  lung]
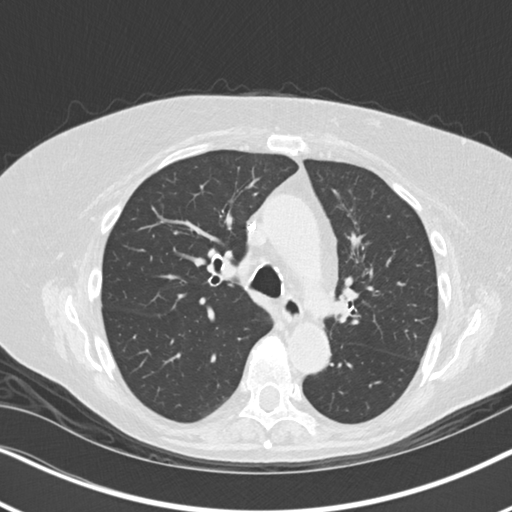
[im 120/162  lung]
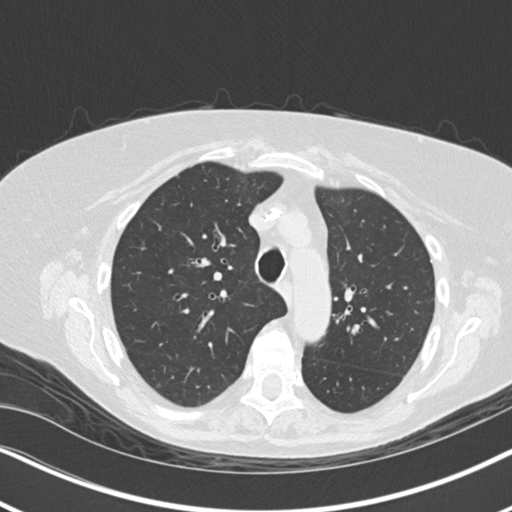
[im 129/162  mediastinal]
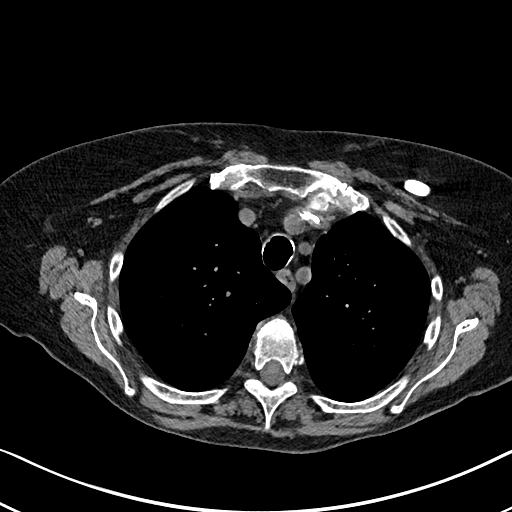
[im 129/162  lung]
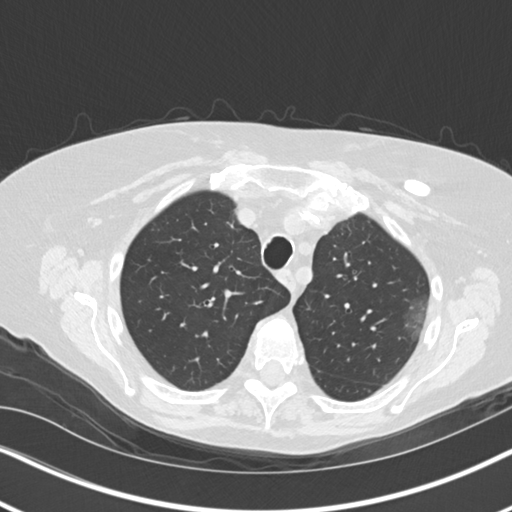
[im 138/162  lung]
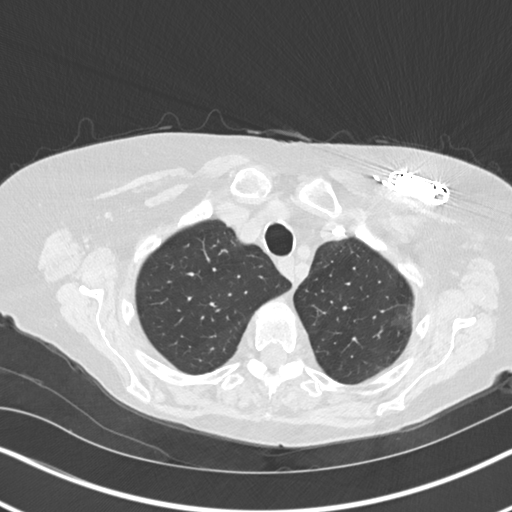
[im 150/162  lung]
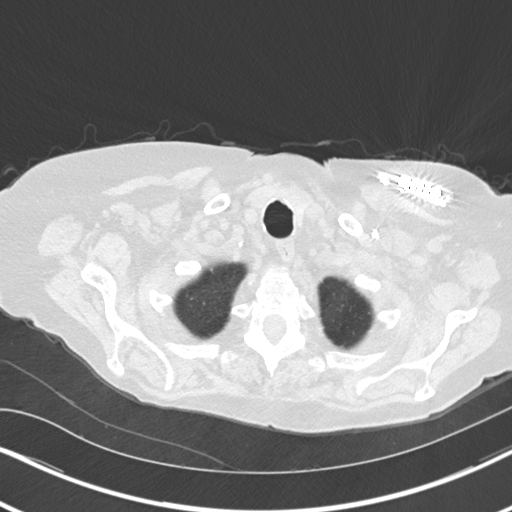

[15 of 34 positions shown; findings below may reference images not displayed]

FINDINGS: Cardiovascular: Heart is normal size. Pacer wires in the right
heart. Aorta is normal caliber.

Mediastinum/Nodes: No mediastinal, hilar, or axillary adenopathy.
Trachea and esophagus are unremarkable. Moderate-sized hiatal
hernia. Thyroid unremarkable.

Lungs/Pleura: Stable peripheral ground-glass opacity in the left
upper lobe. 4 mm nodule in the posterior right lower lobe
peripherally is stable. The previously seen 9 mm posterior left
lower lobe nodule has improved significantly, now 4-5 mm. Scattered
small nodules in the right middle lobe and right upper lobe are
stable. No new or enlarging pulmonary nodule. No effusions.

Upper Abdomen: Imaging into the upper abdomen shows no acute
findings. Calcifications throughout the spleen compatible with old
granulomatous disease. Prior cholecystectomy.

Musculoskeletal: Chest wall soft tissues are unremarkable. No acute
bony abnormality.
IMPRESSION: Stable or improving pulmonary nodule since prior study. No new or
enlarging pulmonary nodule.

Moderate-sized hiatal hernia.

Old granulomatous disease within the spleen.

No acute cardiopulmonary process.

## 2019-09-29 ENCOUNTER — Other Ambulatory Visit: Payer: Self-pay | Admitting: Gastroenterology

## 2019-09-29 DIAGNOSIS — R1312 Dysphagia, oropharyngeal phase: Secondary | ICD-10-CM

## 2019-10-07 ENCOUNTER — Other Ambulatory Visit: Payer: Self-pay | Admitting: Gastroenterology

## 2019-10-07 DIAGNOSIS — K219 Gastro-esophageal reflux disease without esophagitis: Secondary | ICD-10-CM

## 2019-10-07 DIAGNOSIS — R1312 Dysphagia, oropharyngeal phase: Secondary | ICD-10-CM

## 2020-03-07 DIAGNOSIS — I351 Nonrheumatic aortic (valve) insufficiency: Secondary | ICD-10-CM | POA: Insufficient documentation

## 2020-05-23 DIAGNOSIS — I288 Other diseases of pulmonary vessels: Secondary | ICD-10-CM | POA: Insufficient documentation

## 2020-06-21 DIAGNOSIS — I5030 Unspecified diastolic (congestive) heart failure: Secondary | ICD-10-CM

## 2020-06-21 HISTORY — DX: Unspecified diastolic (congestive) heart failure: I50.30

## 2020-09-05 ENCOUNTER — Ambulatory Visit: Payer: Medicare Other

## 2020-09-09 ENCOUNTER — Ambulatory Visit: Payer: Medicare Other | Attending: Internal Medicine

## 2020-09-09 ENCOUNTER — Other Ambulatory Visit: Payer: Self-pay

## 2020-09-09 DIAGNOSIS — M6281 Muscle weakness (generalized): Secondary | ICD-10-CM | POA: Insufficient documentation

## 2020-09-09 DIAGNOSIS — G8929 Other chronic pain: Secondary | ICD-10-CM | POA: Diagnosis present

## 2020-09-09 DIAGNOSIS — R262 Difficulty in walking, not elsewhere classified: Secondary | ICD-10-CM | POA: Diagnosis present

## 2020-09-09 DIAGNOSIS — R2689 Other abnormalities of gait and mobility: Secondary | ICD-10-CM | POA: Diagnosis present

## 2020-09-09 DIAGNOSIS — M25561 Pain in right knee: Secondary | ICD-10-CM | POA: Diagnosis present

## 2020-09-09 NOTE — Therapy (Signed)
[facility] [address] [place], [place], [address] Phone: [phone]   Fax:  [phone]  Physical Therapy Evaluation  Patient Details  Name: Charlotte Glover MRN: [id number] Date of Birth: [birth year] No data recorded  Encounter Date: 09/09/2020   PT End of Session - 09/09/20 1012    Visit Number 1    Number of Visits 16    Date for PT Re-Evaluation 11/04/20    Authorization Type medicare    Authorization - Visit Number 1    Authorization - Number of Visits 10    PT Start Time 0905    PT Stop Time 0945    PT Time Calculation (min) 40 min    Equipment Utilized During Treatment Gait belt    Activity Tolerance Patient tolerated treatment well    Behavior During Therapy WFL for tasks assessed/performed           Past Medical History:  Diagnosis Date  . Arthritis   . Atrial fibrillation ([place])   . Breast cancer ([place]) 2015   DCIS  . Cancer ([place])   . Cardiac arrest ([place])   . CHF (congestive heart failure) ([place])   . Chickenpox   . Coronary artery disease   . Dyspnea   . Dysrhythmia   . Edema   . GERD (gastroesophageal reflux disease)   . Hearing aid worn    bilateral  . History of MI (myocardial infarction)   . HOH (hard of hearing)   . Hypertension 01/23/2012  . Myocardial infarction ([place]) 01/01/2013  . S/P ablation of atrial fibrillation   . Shingles     Past Surgical History:  Procedure Laterality Date  . ANTERIOR VITRECTOMY Left 03/20/2017   Procedure: ANTERIOR VITRECTOMY;  Surgeon: Leandrew Koyanagi, MD;  Location: [place];  Service: Ophthalmology;  Laterality: Left;  IVA TOPICAL LEFT  . ARTERY BIOPSY Right 04/08/2019   Procedure: BIOPSY TEMPORAL ARTERY;  Surgeon: Algernon Huxley, MD;  Location: [facility];  Service: Vascular;  Laterality: Right;  . ATRIAL ABLATION SURGERY    . BREAST BIOPSY Right 2015   + for DCIS   . BREAST SURGERY    . CARDIAC CATHETERIZATION    . CARDIAC  CATHETERIZATION Bilateral 05/08/2016   Procedure: Right/Left Heart Cath and Coronary Angiography;  Surgeon: Isaias Cowman, MD;  Location: [place] [facility];  Service: Cardiovascular;  Laterality: Bilateral;  . CATARACT EXTRACTION W/PHACO Left 03/20/2017   Procedure: IOL EXCHANGE;  Surgeon: Leandrew Koyanagi, MD;  Location: [place];  Service: Ophthalmology;  Laterality: Left;  . CHOLECYSTECTOMY    . DILATION AND CURETTAGE OF UTERUS    . ESOPHAGOGASTRODUODENOSCOPY    . PACEMAKER INSERTION    . PERIPHERAL IRIDOTOMY Left 03/20/2017   Procedure: PERIPHERAL IRIDECTOMY;  Surgeon: Leandrew Koyanagi, MD;  Location: [place];  Service: Ophthalmology;  Laterality: Left;  . ROTATOR CUFF REPAIR Left   . TEE WITHOUT CARDIOVERSION N/A 05/16/2016   Procedure: TRANSESOPHAGEAL ECHOCARDIOGRAM (TEE);  Surgeon: Teodoro Spray, MD;  Location: [facility];  Service: Cardiovascular;  Laterality: N/A;  . TONSILLECTOMY    . uterine polyp      There were no vitals filed for this visit.    Subjective Assessment - 09/09/20 0914    Subjective Patient presente to therapy with complaints of imbalance, LE weakness.    Pertinent History Pts main complaint is her balance. Has a fear of falling but has not actually fallen. Stated she will tend to lean  or start falling backwards, and reports some staggering in her gait path as well. Also reported she would like to work on her leg strength to make it easier to transfer like off/on the commode, out of chairs. Patient has been in therapy previously for vertigo as well as general conditioning. Has had issue with her balance for less than 5 years. Feels her balance has worsened in the last few weeks. Pt does have chronic R knee pain and R shoulder pain. Pt also reported chronic DOE, that she feels has worsened lately as well. No AD use in her home, but will use her [facility] or rollator for community ambulation. Pt also mentioned some neck pain and potential  radicular symptoms to RUE as well as chronic R rotator cuff tear.   PMH of afib, temporal arteritis, pacemaker (sees a cardiologist regularly), HTN, meningioma, CKDIII, NSTEMI, chronic headaches, breast cancer, TIA about 4 years ago.    How long can you sit comfortably? NA    How long can you stand comfortably? patient reported her R knee limits her ability to stand for long periods    How long can you walk comfortably? Pt feels winded by shorter distances now, for example from parking lot to store. Has to stop and catch her breath    Patient Stated Goals improve balance,    Currently in Pain? No/denies             [facility] PT Assessment - 09/09/20 0001      Balance Screen   Has the patient fallen in the past 6 months No    Has the patient had a decrease in activity level because of a fear of falling?  Yes    Is the patient reluctant to leave their home because of a fear of falling?  No      Home Environment   Living Environment Private residence    Living Arrangements Spouse/significant other    Available Help at Discharge Family    Type of [place] Access Level entry    [place] - single point   rollator for community ambulation with good weather     Cognition   Overall Cognitive Status Within Functional Limits for tasks assessed      Standardized Balance Assessment   Standardized Balance Assessment Berg Balance Test;Dynamic Gait Index;Five Times Sit to [employer] Test   Sit to Stand Able to stand  independently using hands    Standing Unsupported Able to stand safely 2 minutes    Sitting with Back Unsupported but Feet Supported on Floor or Stool Able to sit safely and securely 2 minutes    Stand to Sit Controls descent by using hands    Transfers Able to transfer safely, definite need of hands    Standing Unsupported with Eyes Closed Able to stand 10 seconds safely    Standing Unsupported with Feet Together Able to  place feet together independently and stand 1 minute safely    From Standing, Reach Forward with Outstretched Arm Can reach forward >12 cm safely (5")    From Standing Position, Pick up Object from Floor Able to pick up shoe safely and easily    From Standing Position, Turn to Look Behind Over each Shoulder Looks behind one side only/other side shows less weight shift    Turn 360 Degrees Able to turn 360 degrees safely in 4 seconds  or less    Standing Unsupported, Alternately Place Feet on Step/Stool Able to complete >2 steps/needs minimal assist    Standing Unsupported, One Foot in [place] to take small step independently and hold 30 seconds    Standing on One Leg Unable to try or needs assist to prevent fall    Total Score 42            SUBJECTIVE Chief complaint: Onset: Recent changes in overall health/medication: No Directional pattern for falls: posterior Prior history of physical therapy for balance: None Follow-up appointment with MD: None scheduled Red flags (bowel/bladder changes, saddle paresthesia, personal history of cancer, chills/fever, night sweats, unrelenting pain) Negative   OBJECTIVE  MUSCULOSKELETAL: Tremor: Absent Bulk: Normal Tone: Normal, no clonus  Posture No gross abnormalities noted in standing or seated posture  Gait Pt with wide base of support, SPC   Strength R/L 3/3 Hip flexion 3+/3+ Hip external rotation 3+/3+ Hip internal rotation 4/4 Hip abduction in sitting 4/4 Hip adduction in sitting 4-/4 Knee extension 3+/4 Knee flexion 4+/4+Ankle Plantarflexion 4+/4+  Ankle Dorsiflexion   NEUROLOGICAL:  Mental Status Patient is oriented to person, place and time.  Recent memory is intact.  Remote memory is intact.  Attention span and concentration are intact.  Expressive speech is intact.  Patient's fund of knowledge is within normal limits for educational level.  Cranial Nerves Visual acuity and visual fields are intact   Extraocular muscles are intact  Facial sensation is intact bilaterally  Facial strength is intact bilaterally  Hearing is normal as tested by gross conversation Palate elevates midline, normal phonation  Shoulder shrug strength is intact  Tongue protrudes midline   Sensation Grossly intact to light touch bilateral UEs/LEs as determined by testing dermatomes C2-T2/L2-S2 respectively  Coordination/Cerebellar Finger to Nose: decreased velocity but accurate 90% of attempts Heel to Shin: limited due to weakness Pronator Drift: Negative   FUNCTIONAL OUTCOME MEASURES   Results Comments  BERG 42/56 Fall risk, in need of intervention              5TSTS 35seconds with BUE support                     Objective measurements completed on examination: See above findings.    ASSESSMENT Clinical Impression: Pt is a pleasant 87 year-old female referred for difficulty with balance, LE weakness. PT examination reveals deficits in LE strength, narrow base of support, reaching outside BOS, gait abnormalities and decreased confidence with functional activities. These deficits limit the patients ability to perform functional activities such as walking, squatting, transfers, reaching, etc. Outcome measures also indicate pt as a falls risk (BERG, 5 times sit to stand). The pt  will benefit from skilled PT services to address deficits in balance and decrease risk for future falls.       PT Education - 09/09/20 1012    Education Details PT role, POC    Person(s) Educated Patient    Methods Explanation    Comprehension Verbalized understanding            PT Short Term Goals - 09/09/20 1015      PT SHORT TERM GOAL #1   Title Pt will be independent with initial HEP in order to improve strength and balance in order to decrease fall risk and improve function at home    Baseline to be administered next session    Time 4    Period Weeks    Status New  Target Date 10/07/20              PT Long Term Goals - 09/09/20 1017      PT LONG TERM GOAL #1   Title Pt will be independent with finalized HEP in order to improve strength and balance in order to decrease fall risk and improve function at home    Time 8    Period Weeks    Status New    Target Date 11/04/20      PT LONG TERM GOAL #2   Title Pt will improve BERG by at least 3 points in order to demonstrate clinically significant improvement in balance.    Baseline 42/56 on eval 3/18    Time 8    Period Weeks    Status New    Target Date 11/04/20      PT LONG TERM GOAL #3   Title Pt will decrease 5TSTS by at least 3 seconds in order to demonstrate clinically significant improvement in LE strength.    Baseline 35seconds with BUE support on eval 3/18    Time 8    Period Weeks    Status New    Target Date 11/04/20      PT LONG TERM GOAL #4   Title Pt will improve DGI by at least 3 points in order to demonstrate clinically significant improvement in balance and decreased risk for falls.    Time 8    Period Weeks    Status New    Target Date 11/04/20      PT LONG TERM GOAL #5   Title Patient will demonstrate at 12 point improvement in her FOTO score indicating improved function and safety with functional activities.    Baseline 3/18 eval 43    Time 8    Period Weeks    Status New    Target Date 11/04/20      PT LONG TERM GOAL #6   Title The patient will demonstrate at least 1/2 grade improvement in LLE MMT indicating improved strength and ability to perform functional activities such as transfers, ambulation, stairs, curbs, etc    Baseline on eval 3/18 grossly 3+/5 for hip strength    Time 8    Period Weeks    Status New    Target Date 11/04/20                  Plan - 09/09/20 1013    Clinical Impression Statement Pt is a pleasant 87 year-old female referred for difficulty with balance, LE weakness. PT examination reveals deficits in LE strength, narrow base of support, reaching outside  BOS, gait abnormalities and decreased confidence with functional activities. These deficits limit the patients ability to perform functional activities such as walking, squatting, transfers, reaching, etc. Outcome measures also indicate pt as a falls risk (BERG, 5 times sit to stand). The pt will benefit from skilled PT services to address deficits in balance and decrease risk for future falls.    Personal Factors and Comorbidities Age;Comorbidity 3+;Fitness    Comorbidities afib, temporal arteritis, pacemaker (sees a cardiologist regularly), HTN, meningioma, CKDIII, NSTEMI, chronic headaches, breast cancer, TIA about 4 years ago.    Examination-Activity Limitations Transfers;Bed Mobility;Locomotion Level;Stairs;Stand;Reach Overhead;Squat;Lift;Bend    Examination-Participation Restrictions Church;Laundry;Cleaning;Shop;Community Activity;Volunteer;Meal Prep;Interpersonal Relationship    Stability/Clinical Decision Making Stable/Uncomplicated    Clinical Decision Making Moderate    Rehab Potential Good    PT Frequency 2x / week    PT Duration 8 weeks  PT Treatment/Interventions Canalith Repostioning;ADLs/Self Care Home Management;Gait training;Stair training;Therapeutic activities;Therapeutic exercise;Balance training;Neuromuscular re-education;Patient/family education;Vestibular;Cryotherapy;Moist Heat;Electrical Stimulation;Splinting;Taping;Joint Manipulations;Spinal Manipulations;Dry needling;Energy conservation;Passive range of motion;Aquatic Therapy    PT Next Visit Plan DGI, HEP, balance interventions    PT Home Exercise Plan next session    Consulted and Agree with Plan of Care Patient           Patient will benefit from skilled therapeutic intervention in order to improve the following deficits and impairments:  Abnormal gait,Decreased balance,Decreased endurance,Difficulty walking,Cardiopulmonary status limiting activity,Decreased activity tolerance,Decreased strength  Visit  Diagnosis: Difficulty in walking, not elsewhere classified  Muscle weakness (generalized)  Other abnormalities of gait and mobility  Chronic pain of right knee     Problem List Patient Active Problem List   Diagnosis Date Noted  . Intracranial carotid stenosis, right 05/29/2019  . Chronic headaches 05/29/2019  . Long term current use of systemic steroids 05/04/2019  . Screening for osteoporosis 05/04/2019  . Temporal arteritis ([place]) 04/07/2019  . Meningioma ([place]) 12/29/2018  . Chronic daily headache 11/20/2018  . Elevated erythrocyte sedimentation rate 11/05/2018  . CKD (chronic kidney disease) stage 3, GFR 30-59 ml/min (HCC) 12/27/2017  . Shingles 05/06/2017  . 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 Primary osteoarthritis of right shoulder 12/07/2016  . Rotator cuff tear, right 12/07/2016  . Rotator cuff tendinitis, right 12/07/2016  . CHB (complete heart block) ([place]) 06/30/2016  . Severe mitral regurgitation 05/29/2016  . Pedal edema 04/23/2016  . Lung nodule, multiple 12/28/2015  . Amnesia, global, transient 04/05/2015  . 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 Cardiac pacemaker 05/18/2014  . Sleep disorder 05/06/2014  . Diffuse pain 04/06/2014  . Fatigue 03/16/2014  . Headache 03/16/2014  . History of melanoma 11/24/2013  . Melanoma ([place]) 11/24/2013  . DCIS (ductal carcinoma in situ) 08/11/2013  . History of ductal carcinoma in situ (DCIS) of breast 08/11/2013  . H/O non-ST elevation myocardial infarction (NSTEMI) 01/01/2013  . Hearing impairment 01/01/2013  . S/P ablation of atrial fibrillation 07/11/2012  . Edema 01/23/2012  . Hypertension 01/23/2012  . Atrial fibrillation ([place]) 11/27/2011  . Coronary artery disease 11/27/2011    Lieutenant Diego PT, DPT 10:27 AM,09/09/20   [place] [facility] [address] [place], [place],  [address] Phone: [phone]   Fax:  [phone]  Name: Breia Ocampo MRN: [id number] Date of Birth: 26-Mar-1928

## 2020-09-12 ENCOUNTER — Ambulatory Visit: Payer: Medicare Other

## 2020-09-12 ENCOUNTER — Other Ambulatory Visit: Payer: Self-pay

## 2020-09-12 VITALS — BP 146/55 | HR 73

## 2020-09-12 DIAGNOSIS — R262 Difficulty in walking, not elsewhere classified: Secondary | ICD-10-CM | POA: Diagnosis not present

## 2020-09-12 NOTE — Therapy (Signed)
[facility] [address] [place], [place], [address] Phone: [phone]   Fax:  [phone]  Physical Therapy Treatment  Patient Details  Name: Charlotte Glover MRN: [id number] Date of Birth: 01-08-1928 No data recorded  Encounter Date: 09/12/2020   PT End of Session - 09/12/20 1708    Visit Number 2    Number of Visits 16    Date for PT Re-Evaluation 11/04/20    Authorization Type medicare    PT Start Time 1300    PT Stop Time 1345    PT Time Calculation (min) 45 min    Equipment Utilized During Treatment Gait belt    Activity Tolerance Patient tolerated treatment well    Behavior During Therapy WFL for tasks assessed/performed           Past Medical History:  Diagnosis Date  . Arthritis   . Atrial fibrillation ([place])   . Breast cancer ([place]) 2015   DCIS  . Cancer ([place])   . Cardiac arrest ([place])   . CHF (congestive heart failure) ([place])   . Chickenpox   . Coronary artery disease   . Dyspnea   . Dysrhythmia   . Edema   . GERD (gastroesophageal reflux disease)   . Hearing aid worn    bilateral  . History of MI (myocardial infarction)   . HOH (hard of hearing)   . Hypertension 01/23/2012  . Myocardial infarction ([place]) 01/01/2013  . S/P ablation of atrial fibrillation   . Shingles     Past Surgical History:  Procedure Laterality Date  . ANTERIOR VITRECTOMY Left 03/20/2017   Procedure: ANTERIOR VITRECTOMY;  Surgeon: Leandrew Koyanagi, MD;  Location: [facility];  Service: Ophthalmology;  Laterality: Left;  IVA TOPICAL LEFT  . ARTERY BIOPSY Right 04/08/2019   Procedure: BIOPSY TEMPORAL ARTERY;  Surgeon: Algernon Huxley, MD;  Location: [facility];  Service: Vascular;  Laterality: Right;  . ATRIAL ABLATION SURGERY    . BREAST BIOPSY Right 2015   + for DCIS   . BREAST SURGERY    . CARDIAC CATHETERIZATION    . CARDIAC CATHETERIZATION Bilateral 05/08/2016   Procedure: Right/Left Heart Cath and Coronary  Angiography;  Surgeon: Isaias Cowman, MD;  Location: [facility];  Service: Cardiovascular;  Laterality: Bilateral;  . CATARACT EXTRACTION W/PHACO Left 03/20/2017   Procedure: IOL EXCHANGE;  Surgeon: Leandrew Koyanagi, MD;  Location: [facility];  Service: Ophthalmology;  Laterality: Left;  . CHOLECYSTECTOMY    . DILATION AND CURETTAGE OF UTERUS    . ESOPHAGOGASTRODUODENOSCOPY    . PACEMAKER INSERTION    . PERIPHERAL IRIDOTOMY Left 03/20/2017   Procedure: PERIPHERAL IRIDECTOMY;  Surgeon: Leandrew Koyanagi, MD;  Location: [facility];  Service: Ophthalmology;  Laterality: Left;  . ROTATOR CUFF REPAIR Left   . TEE WITHOUT CARDIOVERSION N/A 05/16/2016   Procedure: TRANSESOPHAGEAL ECHOCARDIOGRAM (TEE);  Surgeon: Teodoro Spray, MD;  Location: [facility];  Service: Cardiovascular;  Laterality: N/A;  . TONSILLECTOMY    . uterine polyp      Vitals:   09/12/20 1303  BP: (!) 146/55  Pulse: 73  SpO2: 100%     Subjective Assessment - 09/12/20 1302    Subjective Pt reports that she is doing well today. She denies any stumbles or falls over the last few days. Denies any pain upon arrival today.  No specific questions or concerns.    Pertinent History Pts main complaint is her balance. Has a fear of  falling but has not actually fallen. Stated she will tend to lean or start falling backwards, and reports some staggering in her gait path as well. Also reported she would like to work on her leg strength to make it easier to transfer like off/on the commode, out of chairs. Patient has been in therapy previously for vertigo as well as general conditioning. Has had issue with her balance for less than 5 years. Feels her balance has worsened in the last few weeks. Pt does have chronic R knee pain and R shoulder pain. Pt also reported chronic DOE, that she feels has worsened lately as well. No AD use in her home, but will use her [facility] or rollator for community ambulation. Pt also  mentioned some neck pain and potential radicular symptoms to RUE as well as chronic R rotator cuff tear.   PMH of afib, temporal arteritis, pacemaker (sees a cardiologist regularly), HTN, meningioma, CKDIII, NSTEMI, chronic headaches, breast cancer, TIA about 4 years ago.    How long can you sit comfortably? NA    How long can you stand comfortably? patient reported her R knee limits her ability to stand for long periods    How long can you walk comfortably? Pt feels winded by shorter distances now, for example from parking lot to store. Has to stop and catch her breath    Patient Stated Goals improve balance,    Currently in Pain? No/denies              [facility] PT Assessment - 09/12/20 1316      Dynamic Gait Index   Level Surface Mild Impairment    Change in Gait Speed Moderate Impairment    Gait with Horizontal Head Turns Mild Impairment    Gait with Vertical Head Turns Mild Impairment    Gait and Pivot Turn Normal    Step Over Obstacle Mild Impairment    Step Around Obstacles Normal    Steps Mild Impairment    Total Score 17              TREATMENT   Neuromuscular Re-education  Performed DGI with patient who scored 17/24 Airex WBOS eyes open/closed x 30s each; Airex WBOS horizontal and vertical head turns x 30s each; Airex NBOS eyes open/closed x 30s each; Airex NBOS horizontal and vertical head turns x 30s each;   Ther-ex   NuStep L2 x 3 minutes for warm-up during history (2 minutes unbilled);  Supersets with 2# ankle weights (AW): Seated hip flexion marching 2 x 10; Seated LAQ 2 x 10;  Sit to stand from regular height chair and 2 Airex pads on top x 5, discontinued due to right knee pain;   Pt educated throughout session about proper posture and technique with exercises. Improved exercise technique, movement at target joints, use of target muscles after min to mod verbal, visual, tactile cues.    Patient demonstrates excellent motivation during session today.   Performed DGI with patient scored 17/24. She does appear somewhat limited by her chronic right knee pain and instability especially when ascending/descending stairs.  Initiated balance training with patient today varying base of support on unstable surface.  Also initiated seated exercises as well as sit to stands however these had to be terminated due to right knee pain.  Patient provided HEP that included seated clams and marches with Thera-Band resistance around distal thighs.  Patient encouraged to start HEP and follow-up as scheduled. Pt will benefit from PT services to address deficits  in strength, balance, and mobility in order to return to full function at home.                         PT Education - 09/12/20 1708    Education Details HEP    Person(s) Educated Patient    Methods Explanation;Handout    Comprehension Verbalized understanding            PT Short Term Goals - 09/09/20 1015      PT SHORT TERM GOAL #1   Title Pt will be independent with initial HEP in order to improve strength and balance in order to decrease fall risk and improve function at home    Baseline to be administered next session    Time 4    Period Weeks    Status New    Target Date 10/07/20             PT Long Term Goals - 09/09/20 1017      PT LONG TERM GOAL #1   Title Pt will be independent with finalized HEP in order to improve strength and balance in order to decrease fall risk and improve function at home    Time 8    Period Weeks    Status New    Target Date 11/04/20      PT LONG TERM GOAL #2   Title Pt will improve BERG by at least 3 points in order to demonstrate clinically significant improvement in balance.    Baseline 42/56 on eval 3/18    Time 8    Period Weeks    Status New    Target Date 11/04/20      PT LONG TERM GOAL #3   Title Pt will decrease 5TSTS by at least 3 seconds in order to demonstrate clinically significant improvement in LE strength.     Baseline 35seconds with BUE support on eval 3/18    Time 8    Period Weeks    Status New    Target Date 11/04/20      PT LONG TERM GOAL #4   Title Pt will improve DGI by at least 3 points in order to demonstrate clinically significant improvement in balance and decreased risk for falls.    Time 8    Period Weeks    Status New    Target Date 11/04/20      PT LONG TERM GOAL #5   Title Patient will demonstrate at 12 point improvement in her FOTO score indicating improved function and safety with functional activities.    Baseline 3/18 eval 43    Time 8    Period Weeks    Status New    Target Date 11/04/20      PT LONG TERM GOAL #6   Title The patient will demonstrate at least 1/2 grade improvement in LLE MMT indicating improved strength and ability to perform functional activities such as transfers, ambulation, stairs, curbs, etc    Baseline on eval 3/18 grossly 3+/5 for hip strength    Time 8    Period Weeks    Status New    Target Date 11/04/20                 Plan - 09/12/20 1348    Clinical Impression Statement Patient demonstrates excellent motivation during session today.  Performed DGI with patient scored 17/24. She does appear somewhat limited by her chronic right knee pain and instability especially  when ascending/descending stairs.  Initiated balance training with patient today varying base of support on unstable surface.  Also initiated seated exercises as well as sit to stands however these had to be terminated due to right knee pain.  Patient provided HEP that included seated clams and marches with Thera-Band resistance around distal thighs.  Patient encouraged to start HEP and follow-up as scheduled. Pt will benefit from PT services to address deficits in strength, balance, and mobility in order to return to full function at home.    Personal Factors and Comorbidities Age;Comorbidity 3+;Fitness    Comorbidities afib, temporal arteritis, pacemaker (sees a  cardiologist regularly), HTN, meningioma, CKDIII, NSTEMI, chronic headaches, breast cancer, TIA about 4 years ago.    Examination-Activity Limitations Transfers;Bed Mobility;Locomotion Level;Stairs;Stand;Reach Overhead;Squat;Lift;Bend    Examination-Participation Restrictions Church;Laundry;Cleaning;Shop;Community Activity;Volunteer;Meal Prep;Interpersonal Relationship    Stability/Clinical Decision Making Stable/Uncomplicated    Rehab Potential Good    PT Frequency 2x / week    PT Duration 8 weeks    PT Treatment/Interventions Canalith Repostioning;ADLs/Self Care Home Management;Gait training;Stair training;Therapeutic activities;Therapeutic exercise;Balance training;Neuromuscular re-education;Patient/family education;Vestibular;Cryotherapy;Moist Heat;Electrical Stimulation;Splinting;Taping;Joint Manipulations;Spinal Manipulations;Dry needling;Energy conservation;Passive range of motion;Aquatic Therapy    PT Next Visit Plan DGI, HEP, balance interventions    PT Home Exercise Plan Access Code: [email or web address]    Consulted and Agree with Plan of Care Patient           Patient will benefit from skilled therapeutic intervention in order to improve the following deficits and impairments:  Abnormal gait,Decreased balance,Decreased endurance,Difficulty walking,Cardiopulmonary status limiting activity,Decreased activity tolerance,Decreased strength  Visit Diagnosis: Difficulty in walking, not elsewhere classified     Problem List Patient Active Problem List   Diagnosis Date Noted  . Intracranial carotid stenosis, right 05/29/2019  . Chronic headaches 05/29/2019  . Long term current use of systemic steroids 05/04/2019  . Screening for osteoporosis 05/04/2019  . Temporal arteritis ([place]) 04/07/2019  . Meningioma ([place]) 12/29/2018  . Chronic daily headache 11/20/2018  . Elevated erythrocyte sedimentation rate 11/05/2018  . CKD (chronic kidney disease) stage 3, GFR 30-59 ml/min ([facility]) 12/27/2017  .  Shingles 05/06/2017  . 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 Primary osteoarthritis of right shoulder 12/07/2016  . Rotator cuff tear, right 12/07/2016  . Rotator cuff tendinitis, right 12/07/2016  . CHB (complete heart block) ([place]) 06/30/2016  . Severe mitral regurgitation 05/29/2016  . Pedal edema 04/23/2016  . Lung nodule, multiple 12/28/2015  . Amnesia, global, transient 04/05/2015  . 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 Cardiac pacemaker 05/18/2014  . Sleep disorder 05/06/2014  . Diffuse pain 04/06/2014  . Fatigue 03/16/2014  . Headache 03/16/2014  . History of melanoma 11/24/2013  . Melanoma ([place]) 11/24/2013  . DCIS (ductal carcinoma in situ) 08/11/2013  . History of ductal carcinoma in situ (DCIS) of breast 08/11/2013  . H/O non-ST elevation myocardial infarction (NSTEMI) 01/01/2013  . Hearing impairment 01/01/2013  . S/P ablation of atrial fibrillation 07/11/2012  . Edema 01/23/2012  . Hypertension 01/23/2012  . Atrial fibrillation ([place]) 11/27/2011  . Coronary artery disease 11/27/2011   Lyndel Safe Gustava Berland PT, DPT, GCS  Malek Skog 09/12/2020, 5:14 PM  [facility] [address] [place], [place], [address] Phone: [phone]   Fax:  [phone]  Name: Charlotte Glover MRN: [id number] Date of Birth: 1927-08-26

## 2020-09-12 NOTE — Patient Instructions (Addendum)
Access Code: [email or web address] URL: [email or web address] Date: 09/12/2020 Prepared by: Roxana Hires  Exercises Seated March with Resistance - 1 x daily - 7 x weekly - 2 sets - 10 reps - 3s hold Seated Hip Abduction with Resistance - 1 x daily - 7 x weekly - 2 sets - 10 reps - 3s hold

## 2020-09-16 ENCOUNTER — Ambulatory Visit: Payer: Medicare Other

## 2020-09-16 NOTE — Patient Instructions (Addendum)
Access Code: [email or web address] URL: [email or web address] Date: 09/19/2020 Prepared by: Roxana Hires  Exercises Seated March with Resistance - 1 x daily - 7 x weekly - 2 sets - 10 reps - 3s hold Seated Hip Abduction with Resistance - 1 x daily - 7 x weekly - 2 sets - 10 reps - 3s hold Standing Romberg to 1/2 Tandem Stance - 1 x daily - 7 x weekly - 3 reps - 30s hold

## 2020-09-19 ENCOUNTER — Other Ambulatory Visit: Payer: Self-pay

## 2020-09-19 ENCOUNTER — Ambulatory Visit: Payer: Medicare Other

## 2020-09-19 VITALS — BP 150/54 | HR 75

## 2020-09-19 DIAGNOSIS — R262 Difficulty in walking, not elsewhere classified: Secondary | ICD-10-CM

## 2020-09-19 DIAGNOSIS — M6281 Muscle weakness (generalized): Secondary | ICD-10-CM

## 2020-09-19 NOTE — Therapy (Signed)
[facility] [address] [place], [place], [address] Phone: [phone]   Fax:  [phone]  Physical Therapy Treatment  Patient Details  Name: Charlotte Glover MRN: [id number] Date of Birth: 1928/04/30 No data recorded  Encounter Date: 09/19/2020   PT End of Session - 09/19/20 1314    Visit Number 3    Number of Visits 16    Date for PT Re-Evaluation 11/04/20    Authorization Type medicare    PT Start Time 1300    PT Stop Time 1345    PT Time Calculation (min) 45 min    Equipment Utilized During Treatment Gait belt    Activity Tolerance Patient tolerated treatment well    Behavior During Therapy WFL for tasks assessed/performed           Past Medical History:  Diagnosis Date  . Arthritis   . Atrial fibrillation ([place])   . Breast cancer ([place]) 2015   DCIS  . Cancer ([place])   . Cardiac arrest ([place])   . CHF (congestive heart failure) ([place])   . Chickenpox   . Coronary artery disease   . Dyspnea   . Dysrhythmia   . Edema   . GERD (gastroesophageal reflux disease)   . Hearing aid worn    bilateral  . History of MI (myocardial infarction)   . HOH (hard of hearing)   . Hypertension 01/23/2012  . Myocardial infarction ([place]) 01/01/2013  . S/P ablation of atrial fibrillation   . Shingles     Past Surgical History:  Procedure Laterality Date  . ANTERIOR VITRECTOMY Left 03/20/2017   Procedure: ANTERIOR VITRECTOMY;  Surgeon: Leandrew Koyanagi, MD;  Location: [place];  Service: Ophthalmology;  Laterality: Left;  IVA TOPICAL LEFT  . ARTERY BIOPSY Right 04/08/2019   Procedure: BIOPSY TEMPORAL ARTERY;  Surgeon: Algernon Huxley, MD;  Location: [facility];  Service: Vascular;  Laterality: Right;  . ATRIAL ABLATION SURGERY    . BREAST BIOPSY Right 2015   + for DCIS   . BREAST SURGERY    . CARDIAC CATHETERIZATION    . CARDIAC CATHETERIZATION Bilateral 05/08/2016   Procedure: Right/Left Heart Cath and Coronary  Angiography;  Surgeon: Isaias Cowman, MD;  Location: [place] [facility];  Service: Cardiovascular;  Laterality: Bilateral;  . CATARACT EXTRACTION W/PHACO Left 03/20/2017   Procedure: IOL EXCHANGE;  Surgeon: Leandrew Koyanagi, MD;  Location: [place];  Service: Ophthalmology;  Laterality: Left;  . CHOLECYSTECTOMY    . DILATION AND CURETTAGE OF UTERUS    . ESOPHAGOGASTRODUODENOSCOPY    . PACEMAKER INSERTION    . PERIPHERAL IRIDOTOMY Left 03/20/2017   Procedure: PERIPHERAL IRIDECTOMY;  Surgeon: Leandrew Koyanagi, MD;  Location: [place];  Service: Ophthalmology;  Laterality: Left;  . ROTATOR CUFF REPAIR Left   . TEE WITHOUT CARDIOVERSION N/A 05/16/2016   Procedure: TRANSESOPHAGEAL ECHOCARDIOGRAM (TEE);  Surgeon: Teodoro Spray, MD;  Location: [facility];  Service: Cardiovascular;  Laterality: N/A;  . TONSILLECTOMY    . uterine polyp      Vitals:   09/19/20 1305  BP: (!) 150/54  Pulse: 75  SpO2: 100%     Subjective Assessment - 09/19/20 1303    Subjective Pt reports that she is doing well today. She denies any stumbles or falls over the last few days. Denies any pain upon arrival today.  No specific questions or concerns.    Pertinent History Pts main complaint is her balance. Has a fear of  falling but has not actually fallen. Stated she will tend to lean or start falling backwards, and reports some staggering in her gait path as well. Also reported she would like to work on her leg strength to make it easier to transfer like off/on the commode, out of chairs. Patient has been in therapy previously for vertigo as well as general conditioning. Has had issue with her balance for less than 5 years. Feels her balance has worsened in the last few weeks. Pt does have chronic R knee pain and R shoulder pain. Pt also reported chronic DOE, that she feels has worsened lately as well. No AD use in her home, but will use her [facility] or rollator for community ambulation. Pt also  mentioned some neck pain and potential radicular symptoms to RUE as well as chronic R rotator cuff tear.   PMH of afib, temporal arteritis, pacemaker (sees a cardiologist regularly), HTN, meningioma, CKDIII, NSTEMI, chronic headaches, breast cancer, TIA about 4 years ago.    How long can you sit comfortably? NA    How long can you stand comfortably? patient reported her R knee limits her ability to stand for long periods    How long can you walk comfortably? Pt feels winded by shorter distances now, for example from parking lot to store. Has to stop and catch her breath    Patient Stated Goals improve balance,    Currently in Pain? No/denies              TREATMENT   Neuromuscular Re-education  All exercises performed without UE support unless otherwise specified; Firm NBOS eyes open/closed x 30s each; Firm NBOS horizontal and vertical head turns x 30s each; Half tandem balance alternating forward lower extremity 30 seconds x 2 each; Alternating 6" toe taps without UE support x 10 BLE; Alternating 6" cone toe taps without UE support x 10 BLE; 6" hurdle steps alternating LE x 10 on each side;   Ther-ex   NuStep L2 x 3 minutes for warm-up during history (2 minutes unbilled);  Supersets with 3# ankle weights (AW): Seated hip flexion marching 2 x 15; Seated LAQ 2 x 10;  Sit to stand from regular height chair and 2 Airex pads on top 2 x 10; Standing hip abduction with 3# AW x 10 BLE;   Pt educated throughout session about proper posture and technique with exercises. Improved exercise technique, movement at target joints, use of target muscles after min to mod verbal, visual, tactile cues.    Patient demonstrates excellent motivation during session today. Patient provided HEP progression today to include half tandem balance in standing. Continued with seated and standing exercises as well. She struggles with hurdle steps due to difficulty performing adequate hip flexion.  Patient encouraged to start HEP and follow-up as scheduled. Pt will benefit from PT services to address deficits in strength, balance, and mobility in order to return to full function at home.                          PT Education - 09/19/20 1608    Education Details HEP progression    Person(s) Educated Patient    Methods Explanation;Handout;Demonstration    Comprehension Verbalized understanding            PT Short Term Goals - 09/09/20 1015      PT SHORT TERM GOAL #1   Title Pt will be independent with initial HEP in order to improve  strength and balance in order to decrease fall risk and improve function at home    Baseline to be administered next session    Time 4    Period Weeks    Status New    Target Date 10/07/20             PT Long Term Goals - 09/09/20 1017      PT LONG TERM GOAL #1   Title Pt will be independent with finalized HEP in order to improve strength and balance in order to decrease fall risk and improve function at home    Time 8    Period Weeks    Status New    Target Date 11/04/20      PT LONG TERM GOAL #2   Title Pt will improve BERG by at least 3 points in order to demonstrate clinically significant improvement in balance.    Baseline 42/56 on eval 3/18    Time 8    Period Weeks    Status New    Target Date 11/04/20      PT LONG TERM GOAL #3   Title Pt will decrease 5TSTS by at least 3 seconds in order to demonstrate clinically significant improvement in LE strength.    Baseline 35seconds with BUE support on eval 3/18    Time 8    Period Weeks    Status New    Target Date 11/04/20      PT LONG TERM GOAL #4   Title Pt will improve DGI by at least 3 points in order to demonstrate clinically significant improvement in balance and decreased risk for falls.    Time 8    Period Weeks    Status New    Target Date 11/04/20      PT LONG TERM GOAL #5   Title Patient will demonstrate at 12 point improvement in her FOTO  score indicating improved function and safety with functional activities.    Baseline 3/18 eval 43    Time 8    Period Weeks    Status New    Target Date 11/04/20      PT LONG TERM GOAL #6   Title The patient will demonstrate at least 1/2 grade improvement in LLE MMT indicating improved strength and ability to perform functional activities such as transfers, ambulation, stairs, curbs, etc    Baseline on eval 3/18 grossly 3+/5 for hip strength    Time 8    Period Weeks    Status New    Target Date 11/04/20                 Plan - 09/19/20 1318    Clinical Impression Statement Patient demonstrates excellent motivation during session today. Patient provided HEP progression today to include half tandem balance in standing. Continued with seated and standing exercises as well. She struggles with hurdle steps due to difficulty performing adequate hip flexion. Patient encouraged to start HEP and follow-up as scheduled. Pt will benefit from PT services to address deficits in strength, balance, and mobility in order to return to full function at home.    Personal Factors and Comorbidities Age;Comorbidity 3+;Fitness    Comorbidities afib, temporal arteritis, pacemaker (sees a cardiologist regularly), HTN, meningioma, CKDIII, NSTEMI, chronic headaches, breast cancer, TIA about 4 years ago.    Examination-Activity Limitations Transfers;Bed Mobility;Locomotion Level;Stairs;Stand;Reach Overhead;Squat;Lift;Bend    Examination-Participation Restrictions Church;Laundry;Cleaning;Shop;Community Activity;Volunteer;Meal Prep;Interpersonal Relationship    Stability/Clinical Decision Making Stable/Uncomplicated    Rehab Potential Good  PT Frequency 2x / week    PT Duration 8 weeks    PT Treatment/Interventions Canalith Repostioning;ADLs/Self Care Home Management;Gait training;Stair training;Therapeutic activities;Therapeutic exercise;Balance training;Neuromuscular re-education;Patient/family  education;Vestibular;Cryotherapy;Moist Heat;Electrical Stimulation;Splinting;Taping;Joint Manipulations;Spinal Manipulations;Dry needling;Energy conservation;Passive range of motion;Aquatic Therapy    PT Next Visit Plan balance interventions    PT Home Exercise Plan Access Code: [email or web address]    Consulted and Agree with Plan of Care Patient           Patient will benefit from skilled therapeutic intervention in order to improve the following deficits and impairments:  Abnormal gait,Decreased balance,Decreased endurance,Difficulty walking,Cardiopulmonary status limiting activity,Decreased activity tolerance,Decreased strength  Visit Diagnosis: Difficulty in walking, not elsewhere classified  Muscle weakness (generalized)     Problem List Patient Active Problem List   Diagnosis Date Noted  . Intracranial carotid stenosis, right 05/29/2019  . Chronic headaches 05/29/2019  . Long term current use of systemic steroids 05/04/2019  . Screening for osteoporosis 05/04/2019  . Temporal arteritis ([place]) 04/07/2019  . Meningioma ([place]) 12/29/2018  . Chronic daily headache 11/20/2018  . Elevated erythrocyte sedimentation rate 11/05/2018  . CKD (chronic kidney disease) stage 3, GFR 30-59 ml/min ([facility]) 12/27/2017  . Shingles 05/06/2017  . 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 Primary osteoarthritis of right shoulder 12/07/2016  . Rotator cuff tear, right 12/07/2016  . Rotator cuff tendinitis, right 12/07/2016  . CHB (complete heart block) ([place]) 06/30/2016  . Severe mitral regurgitation 05/29/2016  . Pedal edema 04/23/2016  . Lung nodule, multiple 12/28/2015  . Amnesia, global, transient 04/05/2015  . 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 Cardiac pacemaker 05/18/2014  . Sleep disorder 05/06/2014  . Diffuse pain 04/06/2014  . Fatigue 03/16/2014  . Headache 03/16/2014  . History of melanoma 11/24/2013  . Melanoma  ([place]) 11/24/2013  . DCIS (ductal carcinoma in situ) 08/11/2013  . History of ductal carcinoma in situ (DCIS) of breast 08/11/2013  . H/O non-ST elevation myocardial infarction (NSTEMI) 01/01/2013  . Hearing impairment 01/01/2013  . S/P ablation of atrial fibrillation 07/11/2012  . Edema 01/23/2012  . Hypertension 01/23/2012  . Atrial fibrillation ([place]) 11/27/2011  . Coronary artery disease 11/27/2011   Lyndel Safe Asheton Scheffler PT, DPT, GCS  Franceska Strahm 09/19/2020, 4:24 PM  [facility] [address] [place], [place], [address] Phone: [phone]   Fax:  [phone]  Name: Velmer Broadfoot MRN: [id number] Date of Birth: [birth year]

## 2020-09-22 NOTE — Patient Instructions (Incomplete)
TREATMENT   Neuromuscular Re-education All exercises performed without UE support unless otherwise specified; Firm NBOS eyes open/closed x 30s each; Firm NBOS horizontal and vertical head turns x 30s each; Half tandem balance alternating forward lower extremity 30 seconds x 2 each; Alternating 6" toe taps without UE support x 10 BLE; Alternating 6" cone toe taps without UE support x 10 BLE; 6" hurdle steps alternating LE x 10 on each side;   Ther-ex NuStep L2 x 3 minutes for warm-up during history (2 minutes unbilled);  Supersets with 3# ankle weights (AW): Seated hip flexion marching 2 x 15; Seated LAQ 2 x 10;  Sit to stand from regular height chair and 2 Airex pads ontop2 x 10; Standing hip abduction with 3# AW x 10 BLE;   Pt educated throughout session about proper posture and technique with exercises. Improved exercise technique, movement at target joints, use of target muscles after min to mod verbal, visual, tactile cues.   Patient demonstrates excellent motivation during session today. Patient provided HEP progression today to include half tandem balance in standing. Continued with seated and standing exercises as well. She struggles with hurdle steps due to difficulty performing adequate hip flexion. Patient encouraged to start HEP and follow-up as scheduled. Pt will benefit from PT services to address deficits in strength, balance, and mobility in order to return to full function at home.

## 2020-09-23 ENCOUNTER — Other Ambulatory Visit: Payer: Self-pay

## 2020-09-23 ENCOUNTER — Ambulatory Visit: Payer: Medicare Other | Attending: Internal Medicine

## 2020-09-23 DIAGNOSIS — R262 Difficulty in walking, not elsewhere classified: Secondary | ICD-10-CM | POA: Insufficient documentation

## 2020-09-23 DIAGNOSIS — M6281 Muscle weakness (generalized): Secondary | ICD-10-CM | POA: Insufficient documentation

## 2020-09-23 NOTE — Therapy (Signed)
[facility] [address]. [place], [place], [address] Phone: [phone]   Fax:  [phone]  Physical Therapy Treatment  Patient Details  Name: Charlotte Glover MRN: [id number] Date of Birth: 1928/06/05 No data recorded  Encounter Date: 09/23/2020   PT End of Session - 09/23/20 1055    Visit Number 4    Number of Visits 16    Date for PT Re-Evaluation 11/04/20    Authorization Type medicare    PT Start Time 0945    PT Stop Time 1030    PT Time Calculation (min) 45 min    Equipment Utilized During Treatment Gait belt    Activity Tolerance Patient tolerated treatment well    Behavior During Therapy WFL for tasks assessed/performed           Past Medical History:  Diagnosis Date  . Arthritis   . Atrial fibrillation ([place])   . Breast cancer ([place]) 2015   DCIS  . Cancer ([place])   . Cardiac arrest ([place])   . CHF (congestive heart failure) ([place])   . Chickenpox   . Coronary artery disease   . Dyspnea   . Dysrhythmia   . Edema   . GERD (gastroesophageal reflux disease)   . Hearing aid worn    bilateral  . History of MI (myocardial infarction)   . HOH (hard of hearing)   . Hypertension 01/23/2012  . Myocardial infarction ([place]) 01/01/2013  . S/P ablation of atrial fibrillation   . Shingles     Past Surgical History:  Procedure Laterality Date  . ANTERIOR VITRECTOMY Left 03/20/2017   Procedure: ANTERIOR VITRECTOMY;  Surgeon: Leandrew Koyanagi, MD;  Location: [facility];  Service: Ophthalmology;  Laterality: Left;  IVA TOPICAL LEFT  . ARTERY BIOPSY Right 04/08/2019   Procedure: BIOPSY TEMPORAL ARTERY;  Surgeon: Algernon Huxley, MD;  Location: [facility];  Service: Vascular;  Laterality: Right;  . ATRIAL ABLATION SURGERY    . BREAST BIOPSY Right 2015   + for DCIS   . BREAST SURGERY    . CARDIAC CATHETERIZATION    . CARDIAC CATHETERIZATION Bilateral 05/08/2016   Procedure: Right/Left Heart Cath and Coronary  Angiography;  Surgeon: Isaias Cowman, MD;  Location: [facility];  Service: Cardiovascular;  Laterality: Bilateral;  . CATARACT EXTRACTION W/PHACO Left 03/20/2017   Procedure: IOL EXCHANGE;  Surgeon: Leandrew Koyanagi, MD;  Location: [facility];  Service: Ophthalmology;  Laterality: Left;  . CHOLECYSTECTOMY    . DILATION AND CURETTAGE OF UTERUS    . ESOPHAGOGASTRODUODENOSCOPY    . PACEMAKER INSERTION    . PERIPHERAL IRIDOTOMY Left 03/20/2017   Procedure: PERIPHERAL IRIDECTOMY;  Surgeon: Leandrew Koyanagi, MD;  Location: [facility];  Service: Ophthalmology;  Laterality: Left;  . ROTATOR CUFF REPAIR Left   . TEE WITHOUT CARDIOVERSION N/A 05/16/2016   Procedure: TRANSESOPHAGEAL ECHOCARDIOGRAM (TEE);  Surgeon: Teodoro Spray, MD;  Location: [facility];  Service: Cardiovascular;  Laterality: N/A;  . TONSILLECTOMY    . uterine polyp      There were no vitals filed for this visit.   Subjective Assessment - 09/23/20 1054    Subjective Pt reports that she is doing well today. She denies any stumbles or falls over the last few days. She has been having some knee pain but denies any resting pain upon arrival today. No specific questions or concerns.    Pertinent History Pts main complaint is her balance. Has a fear of falling  but has not actually fallen. Stated she will tend to lean or start falling backwards, and reports some staggering in her gait path as well. Also reported she would like to work on her leg strength to make it easier to transfer like off/on the commode, out of chairs. Patient has been in therapy previously for vertigo as well as general conditioning. Has had issue with her balance for less than 5 years. Feels her balance has worsened in the last few weeks. Pt does have chronic R knee pain and R shoulder pain. Pt also reported chronic DOE, that she feels has worsened lately as well. No AD use in her home, but will use her [facility] or rollator for community  ambulation. Pt also mentioned some neck pain and potential radicular symptoms to RUE as well as chronic R rotator cuff tear.   PMH of afib, temporal arteritis, pacemaker (sees a cardiologist regularly), HTN, meningioma, CKDIII, NSTEMI, chronic headaches, breast cancer, TIA about 4 years ago.    How long can you sit comfortably? NA    How long can you stand comfortably? patient reported her R knee limits her ability to stand for long periods    How long can you walk comfortably? Pt feels winded by shorter distances now, for example from parking lot to store. Has to stop and catch her breath    Patient Stated Goals improve balance,    Currently in Pain? No/denies              TREATMENT   Neuromuscular Re-education All exercises performed without UE support unless otherwise specified; Airex alternating 6" toe taps without UE support x 10 BLE; Airex wide base of support eyes open/closed x 30s each; Airex wide base of support eyes open horizontal and vertical head turns x 30s each; 6" serial hurdle steps forward and lateral x 4 lengths each; Dix-Hallpike and roll tests performed which are negative bilaterally for both vertigo and nystagmus;   Ther-ex Nautilus resisted gait 20# forward, backward, R lateral, and L lateral x 2 each direction; Supine SLR hip flexion x 10 BLE; Supine straight leg hip abduction x 10 BLE, with manual resistance;  Supine straight leg hip adduction x 10 BLE, with manual resistance; Hooklying clams x 10 BLE, with manual resistance; Hooklying adductor squeeze x 10 BLE, with manual resistance;   Pt educated throughout session about proper posture and technique with exercises. Improved exercise technique, movement at target joints, use of target muscles after min to mod verbal, visual, tactile cues.   Patient demonstrates excellent motivation during session today.  Continued with balance exercises well as lower extremity strengthening however avoided  exercise involving significant knee flexion due to reports of knee pain with activity.  Performed Dix-Hallpike and roll test today given patient's previous history of BPPV and to rule out any ongoing symptoms.  Both tests are negative bilaterally.  No HEP modifications made on this date however will review and follow-up sessions. Patient encouraged to continue current HEP and follow-up as scheduled.  She will benefit from PT services to address deficits in strength, balance, and mobility in order to return to full function at home.                            PT Short Term Goals - 09/09/20 1015      PT SHORT TERM GOAL #1   Title Pt will be independent with initial HEP in order to improve strength and  balance in order to decrease fall risk and improve function at home    Baseline to be administered next session    Time 4    Period Weeks    Status New    Target Date 10/07/20             PT Long Term Goals - 09/09/20 1017      PT LONG TERM GOAL #1   Title Pt will be independent with finalized HEP in order to improve strength and balance in order to decrease fall risk and improve function at home    Time 8    Period Weeks    Status New    Target Date 11/04/20      PT LONG TERM GOAL #2   Title Pt will improve BERG by at least 3 points in order to demonstrate clinically significant improvement in balance.    Baseline 42/56 on eval 3/18    Time 8    Period Weeks    Status New    Target Date 11/04/20      PT LONG TERM GOAL #3   Title Pt will decrease 5TSTS by at least 3 seconds in order to demonstrate clinically significant improvement in LE strength.    Baseline 35seconds with BUE support on eval 3/18    Time 8    Period Weeks    Status New    Target Date 11/04/20      PT LONG TERM GOAL #4   Title Pt will improve DGI by at least 3 points in order to demonstrate clinically significant improvement in balance and decreased risk for falls.    Time 8     Period Weeks    Status New    Target Date 11/04/20      PT LONG TERM GOAL #5   Title Patient will demonstrate at 12 point improvement in her FOTO score indicating improved function and safety with functional activities.    Baseline 3/18 eval 43    Time 8    Period Weeks    Status New    Target Date 11/04/20      PT LONG TERM GOAL #6   Title The patient will demonstrate at least 1/2 grade improvement in LLE MMT indicating improved strength and ability to perform functional activities such as transfers, ambulation, stairs, curbs, etc    Baseline on eval 3/18 grossly 3+/5 for hip strength    Time 8    Period Weeks    Status New    Target Date 11/04/20                 Plan - 09/23/20 1057    Clinical Impression Statement Patient demonstrates excellent motivation during session today.  Continued with balance exercises well as lower extremity strengthening however avoided exercise involving significant knee flexion due to reports of knee pain with activity.  Performed Dix-Hallpike and roll test today given patient's previous history of BPPV and to rule out any ongoing symptoms.  Both tests are negative bilaterally.  No HEP modifications made on this date however will review and follow-up sessions. Patient encouraged to continue current HEP and follow-up as scheduled.  She will benefit from PT services to address deficits in strength, balance, and mobility in order to return to full function at home.    Personal Factors and Comorbidities Age;Comorbidity 3+;Fitness    Comorbidities afib, temporal arteritis, pacemaker (sees a cardiologist regularly), HTN, meningioma, CKDIII, NSTEMI, chronic headaches, breast cancer, TIA about 4  years ago.    Examination-Activity Limitations Transfers;Bed Mobility;Locomotion Level;Stairs;Stand;Reach Overhead;Squat;Lift;Bend    Examination-Participation Restrictions Church;Laundry;Cleaning;Shop;Community Activity;Volunteer;Meal Prep;Interpersonal Relationship     Stability/Clinical Decision Making Stable/Uncomplicated    Rehab Potential Good    PT Frequency 2x / week    PT Duration 8 weeks    PT Treatment/Interventions Canalith Repostioning;ADLs/Self Care Home Management;Gait training;Stair training;Therapeutic activities;Therapeutic exercise;Balance training;Neuromuscular re-education;Patient/family education;Vestibular;Cryotherapy;Moist Heat;Electrical Stimulation;Splinting;Taping;Joint Manipulations;Spinal Manipulations;Dry needling;Energy conservation;Passive range of motion;Aquatic Therapy    PT Next Visit Plan balance interventions    PT Home Exercise Plan Access Code: [email or web address]    Consulted and Agree with Plan of Care Patient           Patient will benefit from skilled therapeutic intervention in order to improve the following deficits and impairments:  Abnormal gait,Decreased balance,Decreased endurance,Difficulty walking,Cardiopulmonary status limiting activity,Decreased activity tolerance,Decreased strength  Visit Diagnosis: Difficulty in walking, not elsewhere classified  Muscle weakness (generalized)     Problem List Patient Active Problem List   Diagnosis Date Noted  . Intracranial carotid stenosis, right 05/29/2019  . Chronic headaches 05/29/2019  . Long term current use of systemic steroids 05/04/2019  . Screening for osteoporosis 05/04/2019  . Temporal arteritis ([place]) 04/07/2019  . Meningioma ([place]) 12/29/2018  . Chronic daily headache 11/20/2018  . Elevated erythrocyte sedimentation rate 11/05/2018  . CKD (chronic kidney disease) stage 3, GFR 30-59 ml/min ([facility]) 12/27/2017  . Shingles 05/06/2017  . 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 Primary osteoarthritis of right shoulder 12/07/2016  . Rotator cuff tear, right 12/07/2016  . Rotator cuff tendinitis, right 12/07/2016  . CHB (complete heart block) ([place]) 06/30/2016  . Severe mitral regurgitation 05/29/2016  . Pedal edema 04/23/2016  .  Lung nodule, multiple 12/28/2015  . Amnesia, global, transient 04/05/2015  . 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 Cardiac pacemaker 05/18/2014  . Sleep disorder 05/06/2014  . Diffuse pain 04/06/2014  . Fatigue 03/16/2014  . Headache 03/16/2014  . History of melanoma 11/24/2013  . Melanoma ([place]) 11/24/2013  . DCIS (ductal carcinoma in situ) 08/11/2013  . History of ductal carcinoma in situ (DCIS) of breast 08/11/2013  . H/O non-ST elevation myocardial infarction (NSTEMI) 01/01/2013  . Hearing impairment 01/01/2013  . S/P ablation of atrial fibrillation 07/11/2012  . Edema 01/23/2012  . Hypertension 01/23/2012  . Atrial fibrillation ([place]) 11/27/2011  . Coronary artery disease 11/27/2011   Lyndel Safe Yao Hyppolite PT, DPT, GCS  Koah Chisenhall 09/23/2020, 12:05 PM  [facility] [address]. [place], [place], [address] Phone: [phone]   Fax:  [phone]  Name: Addelyn Alleman MRN: [id number] Date of Birth: 07/20/1927

## 2020-09-26 ENCOUNTER — Ambulatory Visit: Payer: Medicare Other

## 2020-09-26 ENCOUNTER — Other Ambulatory Visit: Payer: Self-pay

## 2020-09-26 DIAGNOSIS — M6281 Muscle weakness (generalized): Secondary | ICD-10-CM

## 2020-09-26 DIAGNOSIS — R262 Difficulty in walking, not elsewhere classified: Secondary | ICD-10-CM

## 2020-09-26 NOTE — Therapy (Signed)
[facility] [address]. [place], [place], [address] Phone: [phone]   Fax:  [phone]  Physical Therapy Treatment  Patient Details  Name: Charlotte Glover MRN: [id number] Date of Birth: 11/25/1927 No data recorded  Encounter Date: 09/26/2020   PT End of Session - 09/26/20 1331    Visit Number 5    Number of Visits 16    Date for PT Re-Evaluation 11/04/20    Authorization Type medicare    PT Start Time 1315    PT Stop Time 1400    PT Time Calculation (min) 45 min    Equipment Utilized During Treatment Gait belt    Activity Tolerance Patient tolerated treatment well    Behavior During Therapy WFL for tasks assessed/performed           Past Medical History:  Diagnosis Date  . Arthritis   . Atrial fibrillation ([place])   . Breast cancer ([place]) 2015   DCIS  . Cancer ([place])   . Cardiac arrest ([place])   . CHF (congestive heart failure) ([place])   . Chickenpox   . Coronary artery disease   . Dyspnea   . Dysrhythmia   . Edema   . GERD (gastroesophageal reflux disease)   . Hearing aid worn    bilateral  . History of MI (myocardial infarction)   . HOH (hard of hearing)   . Hypertension 01/23/2012  . Myocardial infarction ([place]) 01/01/2013  . S/P ablation of atrial fibrillation   . Shingles     Past Surgical History:  Procedure Laterality Date  . ANTERIOR VITRECTOMY Left 03/20/2017   Procedure: ANTERIOR VITRECTOMY;  Surgeon: Leandrew Koyanagi, MD;  Location: [place];  Service: Ophthalmology;  Laterality: Left;  IVA TOPICAL LEFT  . ARTERY BIOPSY Right 04/08/2019   Procedure: BIOPSY TEMPORAL ARTERY;  Surgeon: Algernon Huxley, MD;  Location: [facility];  Service: Vascular;  Laterality: Right;  . ATRIAL ABLATION SURGERY    . BREAST BIOPSY Right 2015   + for DCIS   . BREAST SURGERY    . CARDIAC CATHETERIZATION    . CARDIAC CATHETERIZATION Bilateral 05/08/2016   Procedure: Right/Left Heart Cath and Coronary  Angiography;  Surgeon: Isaias Cowman, MD;  Location: [place] [facility];  Service: Cardiovascular;  Laterality: Bilateral;  . CATARACT EXTRACTION W/PHACO Left 03/20/2017   Procedure: IOL EXCHANGE;  Surgeon: Leandrew Koyanagi, MD;  Location: [place];  Service: Ophthalmology;  Laterality: Left;  . CHOLECYSTECTOMY    . DILATION AND CURETTAGE OF UTERUS    . ESOPHAGOGASTRODUODENOSCOPY    . PACEMAKER INSERTION    . PERIPHERAL IRIDOTOMY Left 03/20/2017   Procedure: PERIPHERAL IRIDECTOMY;  Surgeon: Leandrew Koyanagi, MD;  Location: [place];  Service: Ophthalmology;  Laterality: Left;  . ROTATOR CUFF REPAIR Left   . TEE WITHOUT CARDIOVERSION N/A 05/16/2016   Procedure: TRANSESOPHAGEAL ECHOCARDIOGRAM (TEE);  Surgeon: Teodoro Spray, MD;  Location: [facility];  Service: Cardiovascular;  Laterality: N/A;  . TONSILLECTOMY    . uterine polyp      There were no vitals filed for this visit.   Subjective Assessment - 09/26/20 1330    Subjective Pt reports that she is doing well today. She denies any falls over the last few days but did have a stumble this morning that scared her. She has been having continued knee pain but denies any resting pain upon arrival today. No specific questions or concerns.    Pertinent History Pts main complaint  is her balance. Has a fear of falling but has not actually fallen. Stated she will tend to lean or start falling backwards, and reports some staggering in her gait path as well. Also reported she would like to work on her leg strength to make it easier to transfer like off/on the commode, out of chairs. Patient has been in therapy previously for vertigo as well as general conditioning. Has had issue with her balance for less than 5 years. Feels her balance has worsened in the last few weeks. Pt does have chronic R knee pain and R shoulder pain. Pt also reported chronic DOE, that she feels has worsened lately as well. No AD use in her home, but  will use her [facility] or rollator for community ambulation. Pt also mentioned some neck pain and potential radicular symptoms to RUE as well as chronic R rotator cuff tear.   PMH of afib, temporal arteritis, pacemaker (sees a cardiologist regularly), HTN, meningioma, CKDIII, NSTEMI, chronic headaches, breast cancer, TIA about 4 years ago.    How long can you sit comfortably? NA    How long can you stand comfortably? patient reported her R knee limits her ability to stand for long periods    How long can you walk comfortably? Pt feels winded by shorter distances now, for example from parking lot to store. Has to stop and catch her breath    Patient Stated Goals improve balance,    Currently in Pain? No/denies             TREATMENT   Neuromuscular Re-education All exercises performed without UE support unless otherwise specified; 6" step taps x 10 BLE; 6" staggered stance (one foot on step and one foot on ground) alternating LE x 30s each; Airex alternating 6" toe taps x 10 BLE; 6" Airex staggered stance (one foot on step and one foot on Airex pad) alternating LE x 30s each; Airex wide base of support eyes open/closed x 30s each; Airex wide base of support eyes open horizontal and vertical head turns x 30s each; Airex  6" serial hurdle steps forward and lateral x 4 lengths each;  Gait outside on grass/mulch: Forward/backwards x 1 minute each; Lateral stepping x 1 minute each direction; Forward gait with horizontal and vertical head turns x 1 minute each;  Curb ascend/descend x multiple bouts each without UE support;   Ther-ex Seated clams with green tband 2 x 1 minute; Seated adductor ball squeezes 3s hold 2 x 1 minute;  Standing exercises with 3# ankle weights (AW): Hip flexion marches alternating x 1 minute; Hip abduction x 30s on each side; HS curls alternating x 1 minute;   Pt educated throughout session about proper posture and technique with exercises. Improved  exercise technique, movement at target joints, use of target muscles after min to mod verbal, visual, tactile cues.   Patient demonstrates excellent motivation during session today.  Continued with balance exercises well as lower extremity strengthening however avoided exercise involving significant knee flexion due to reports of knee pain with activity. Performed forward and retro gait today outside across grass/mulch. Incorporated vertical and horizontal head turns as well as side stepping. Also practiced curb navigation. Pt denies any increase in her knee pain during session. No HEP modifications made on this date however will review and follow-up sessions. Patient encouraged to continue current HEP and follow-up as scheduled.  She will benefit from PT services to address deficits in strength, balance, and mobility in order to return  to full function at home.                           PT Short Term Goals - 09/09/20 1015      PT SHORT TERM GOAL #1   Title Pt will be independent with initial HEP in order to improve strength and balance in order to decrease fall risk and improve function at home    Baseline to be administered next session    Time 4    Period Weeks    Status New    Target Date 10/07/20             PT Long Term Goals - 09/09/20 1017      PT LONG TERM GOAL #1   Title Pt will be independent with finalized HEP in order to improve strength and balance in order to decrease fall risk and improve function at home    Time 8    Period Weeks    Status New    Target Date 11/04/20      PT LONG TERM GOAL #2   Title Pt will improve BERG by at least 3 points in order to demonstrate clinically significant improvement in balance.    Baseline 42/56 on eval 3/18    Time 8    Period Weeks    Status New    Target Date 11/04/20      PT LONG TERM GOAL #3   Title Pt will decrease 5TSTS by at least 3 seconds in order to demonstrate clinically significant  improvement in LE strength.    Baseline 35seconds with BUE support on eval 3/18    Time 8    Period Weeks    Status New    Target Date 11/04/20      PT LONG TERM GOAL #4   Title Pt will improve DGI by at least 3 points in order to demonstrate clinically significant improvement in balance and decreased risk for falls.    Time 8    Period Weeks    Status New    Target Date 11/04/20      PT LONG TERM GOAL #5   Title Patient will demonstrate at 12 point improvement in her FOTO score indicating improved function and safety with functional activities.    Baseline 3/18 eval 43    Time 8    Period Weeks    Status New    Target Date 11/04/20      PT LONG TERM GOAL #6   Title The patient will demonstrate at least 1/2 grade improvement in LLE MMT indicating improved strength and ability to perform functional activities such as transfers, ambulation, stairs, curbs, etc    Baseline on eval 3/18 grossly 3+/5 for hip strength    Time 8    Period Weeks    Status New    Target Date 11/04/20                 Plan - 09/26/20 1332    Clinical Impression Statement Patient demonstrates excellent motivation during session today.  Continued with balance exercises well as lower extremity strengthening however avoided exercise involving significant knee flexion due to reports of knee pain with activity. Performed forward and retro gait today outside across grass/mulch. Incorporated vertical and horizontal head turns as well as side stepping. Also practiced curb navigation. Pt denies any increase in her knee pain during session. No HEP modifications made on this date however  will review and follow-up sessions. Patient encouraged to continue current HEP and follow-up as scheduled.  She will benefit from PT services to address deficits in strength, balance, and mobility in order to return to full function at home.    Personal Factors and Comorbidities Age;Comorbidity 3+;Fitness    Comorbidities afib,  temporal arteritis, pacemaker (sees a cardiologist regularly), HTN, meningioma, CKDIII, NSTEMI, chronic headaches, breast cancer, TIA about 4 years ago.    Examination-Activity Limitations Transfers;Bed Mobility;Locomotion Level;Stairs;Stand;Reach Overhead;Squat;Lift;Bend    Examination-Participation Restrictions Church;Laundry;Cleaning;Shop;Community Activity;Volunteer;Meal Prep;Interpersonal Relationship    Stability/Clinical Decision Making Stable/Uncomplicated    Rehab Potential Good    PT Frequency 2x / week    PT Duration 8 weeks    PT Treatment/Interventions Canalith Repostioning;ADLs/Self Care Home Management;Gait training;Stair training;Therapeutic activities;Therapeutic exercise;Balance training;Neuromuscular re-education;Patient/family education;Vestibular;Cryotherapy;Moist Heat;Electrical Stimulation;Splinting;Taping;Joint Manipulations;Spinal Manipulations;Dry needling;Energy conservation;Passive range of motion;Aquatic Therapy    PT Next Visit Plan balance interventions    PT Home Exercise Plan Access Code: [email or web address]    Consulted and Agree with Plan of Care Patient           Patient will benefit from skilled therapeutic intervention in order to improve the following deficits and impairments:  Abnormal gait,Decreased balance,Decreased endurance,Difficulty walking,Cardiopulmonary status limiting activity,Decreased activity tolerance,Decreased strength  Visit Diagnosis: Difficulty in walking, not elsewhere classified  Muscle weakness (generalized)     Problem List Patient Active Problem List   Diagnosis Date Noted  . Intracranial carotid stenosis, right 05/29/2019  . Chronic headaches 05/29/2019  . Long term current use of systemic steroids 05/04/2019  . Screening for osteoporosis 05/04/2019  . Temporal arteritis ([place]) 04/07/2019  . Meningioma ([place]) 12/29/2018  . Chronic daily headache 11/20/2018  . Elevated erythrocyte sedimentation rate 11/05/2018  . CKD (chronic  kidney disease) stage 3, GFR 30-59 ml/min (HCC) 12/27/2017  . Shingles 05/06/2017  . 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 Primary osteoarthritis of right shoulder 12/07/2016  . Rotator cuff tear, right 12/07/2016  . Rotator cuff tendinitis, right 12/07/2016  . CHB (complete heart block) ([place]) 06/30/2016  . Severe mitral regurgitation 05/29/2016  . Pedal edema 04/23/2016  . Lung nodule, multiple 12/28/2015  . Amnesia, global, transient 04/05/2015  . 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 Cardiac pacemaker 05/18/2014  . Sleep disorder 05/06/2014  . Diffuse pain 04/06/2014  . Fatigue 03/16/2014  . Headache 03/16/2014  . History of melanoma 11/24/2013  . Melanoma ([place]) 11/24/2013  . DCIS (ductal carcinoma in situ) 08/11/2013  . History of ductal carcinoma in situ (DCIS) of breast 08/11/2013  . H/O non-ST elevation myocardial infarction (NSTEMI) 01/01/2013  . Hearing impairment 01/01/2013  . S/P ablation of atrial fibrillation 07/11/2012  . Edema 01/23/2012  . Hypertension 01/23/2012  . Atrial fibrillation ([place]) 11/27/2011  . Coronary artery disease 11/27/2011   Lyndel Safe Dianna Ewald PT, DPT, GCS  Linsey Hirota 09/26/2020, 2:05 PM   [facility] [address]. [place], [place], [address] Phone: [phone]   Fax:  [phone]  Name: Charlotte Glover MRN: [id number] Date of Birth: 12/26/1927

## 2020-09-29 NOTE — Patient Instructions (Signed)
TREATMENT   Neuromuscular Re-education All exercises performed without UE support unless otherwise specified; 6" step taps x 10 BLE; 6" staggered stance (one foot on step and one foot on ground) alternating LE x 30s each; Airex alternating 6" toe taps x 10 BLE; 6" Airex staggered stance (one foot on step and one foot on Airex pad) alternating LE x 30s each; Airex wide base of support eyes open/closed x 30s each; Airex wide base of support eyes openhorizontal and vertical head turnsx 30s each; Airex  6"serialhurdle stepsforward and lateral x 4 lengths each;  Gait outside on grass/mulch: Forward/backwards x 1 minute each; Lateral stepping x 1 minute each direction; Forward gait with horizontal and vertical head turns x 1 minute each;  Curb ascend/descend x multiple bouts each without UE support;   Ther-ex Seated clams with green tband 2 x 1 minute; Seated adductor ball squeezes 3s hold 2 x 1 minute;  Standing exercises with 3# ankle weights (AW): Hip flexion marches alternating x 1 minute; Hip abduction x 30s on each side; HS curls alternating x 1 minute;   Pt educated throughout session about proper posture and technique with exercises. Improved exercise technique, movement at target joints, use of target muscles after min to mod verbal, visual, tactile cues.   Patient demonstrates excellent motivation during session today.Continued with balance exercises well as lower extremity strengthening however avoided exercise involving significant knee flexion due to reports of knee pain with activity. Performed forward and retro gait today outside across grass/mulch. Incorporated vertical and horizontal head turns as well as side stepping. Also practiced curb navigation. Pt denies any increase in her knee pain during session. No HEP modifications made on this date however will review and follow-up sessions. Patient encouraged tocontinue currentHEP and follow-up as  scheduled.Shewill benefit from PT services to address deficits in strength, balance, and mobility in order to return to full function at home.

## 2020-09-30 ENCOUNTER — Ambulatory Visit: Payer: Medicare Other

## 2020-09-30 ENCOUNTER — Other Ambulatory Visit: Payer: Self-pay

## 2020-09-30 DIAGNOSIS — R262 Difficulty in walking, not elsewhere classified: Secondary | ICD-10-CM | POA: Diagnosis not present

## 2020-09-30 DIAGNOSIS — M6281 Muscle weakness (generalized): Secondary | ICD-10-CM

## 2020-09-30 NOTE — Therapy (Signed)
[facility] [address] [place], [place], [address] Phone: [phone]   Fax:  [phone]  Physical Therapy Treatment  Patient Details  Name: Charlotte Glover MRN: [id number] Date of Birth: 11/18/1927 No data recorded  Encounter Date: 09/30/2020   PT End of Session - 09/30/20 0957    Visit Number 6    Number of Visits 16    Date for PT Re-Evaluation 11/04/20    Authorization Type medicare    PT Start Time 0932    PT Stop Time 1015    PT Time Calculation (min) 43 min    Equipment Utilized During Treatment Gait belt    Activity Tolerance Patient tolerated treatment well    Behavior During Therapy WFL for tasks assessed/performed           Past Medical History:  Diagnosis Date  . Arthritis   . Atrial fibrillation ([place])   . Breast cancer ([place]) 2015   DCIS  . Cancer ([place])   . Cardiac arrest ([place])   . CHF (congestive heart failure) ([place])   . Chickenpox   . Coronary artery disease   . Dyspnea   . Dysrhythmia   . Edema   . GERD (gastroesophageal reflux disease)   . Hearing aid worn    bilateral  . History of MI (myocardial infarction)   . HOH (hard of hearing)   . Hypertension 01/23/2012  . Myocardial infarction ([place]) 01/01/2013  . S/P ablation of atrial fibrillation   . Shingles     Past Surgical History:  Procedure Laterality Date  . ANTERIOR VITRECTOMY Left 03/20/2017   Procedure: ANTERIOR VITRECTOMY;  Surgeon: Leandrew Koyanagi, MD;  Location: [place];  Service: Ophthalmology;  Laterality: Left;  IVA TOPICAL LEFT  . ARTERY BIOPSY Right 04/08/2019   Procedure: BIOPSY TEMPORAL ARTERY;  Surgeon: Algernon Huxley, MD;  Location: [facility];  Service: Vascular;  Laterality: Right;  . ATRIAL ABLATION SURGERY    . BREAST BIOPSY Right 2015   + for DCIS   . BREAST SURGERY    . CARDIAC CATHETERIZATION    . CARDIAC CATHETERIZATION Bilateral 05/08/2016   Procedure: Right/Left Heart Cath and Coronary  Angiography;  Surgeon: Isaias Cowman, MD;  Location: [place] [facility];  Service: Cardiovascular;  Laterality: Bilateral;  . CATARACT EXTRACTION W/PHACO Left 03/20/2017   Procedure: IOL EXCHANGE;  Surgeon: Leandrew Koyanagi, MD;  Location: [place];  Service: Ophthalmology;  Laterality: Left;  . CHOLECYSTECTOMY    . DILATION AND CURETTAGE OF UTERUS    . ESOPHAGOGASTRODUODENOSCOPY    . PACEMAKER INSERTION    . PERIPHERAL IRIDOTOMY Left 03/20/2017   Procedure: PERIPHERAL IRIDECTOMY;  Surgeon: Leandrew Koyanagi, MD;  Location: [place];  Service: Ophthalmology;  Laterality: Left;  . ROTATOR CUFF REPAIR Left   . TEE WITHOUT CARDIOVERSION N/A 05/16/2016   Procedure: TRANSESOPHAGEAL ECHOCARDIOGRAM (TEE);  Surgeon: Teodoro Spray, MD;  Location: [facility];  Service: Cardiovascular;  Laterality: N/A;  . TONSILLECTOMY    . uterine polyp      There were no vitals filed for this visit.   Subjective Assessment - 09/30/20 0956    Subjective Pt reports that she is doing well today. She denies any falls or stumbles since last therapy session. She did go out to eat with her granddaughter and was embarrased because she had trouble standing up from the table and had to have the waiter help her. She denies any knee pain upon arrival. No  specific questions or concerns.    Pertinent History Pts main complaint is her balance. Has a fear of falling but has not actually fallen. Stated she will tend to lean or start falling backwards, and reports some staggering in her gait path as well. Also reported she would like to work on her leg strength to make it easier to transfer like off/on the commode, out of chairs. Patient has been in therapy previously for vertigo as well as general conditioning. Has had issue with her balance for less than 5 years. Feels her balance has worsened in the last few weeks. Pt does have chronic R knee pain and R shoulder pain. Pt also reported chronic DOE, that  she feels has worsened lately as well. No AD use in her home, but will use her [facility] or rollator for community ambulation. Pt also mentioned some neck pain and potential radicular symptoms to RUE as well as chronic R rotator cuff tear.   PMH of afib, temporal arteritis, pacemaker (sees a cardiologist regularly), HTN, meningioma, CKDIII, NSTEMI, chronic headaches, breast cancer, TIA about 4 years ago.    How long can you sit comfortably? NA    How long can you stand comfortably? patient reported her R knee limits her ability to stand for long periods    How long can you walk comfortably? Pt feels winded by shorter distances now, for example from parking lot to store. Has to stop and catch her breath    Patient Stated Goals improve balance,    Currently in Pain? No/denies             TREATMENT   Ther-ex NuStep L1-2 x 5 minutes for warm-up during history (2 minutes unbilled); Sit to stand without UE support from regular height chair with 2 Airex pads on seat x 10; Sit to stand without UE support from regular height chair with 1 Airex pad on seat x 7, pt unable to perform additional repetitions; Seated clams with green tband 2 x 1 minute; Seated adductor ball squeezes 3s hold 2 x 1 minute; Seated LAQ with 4# ankle weights x 1 minute;  Standing exercises with 4# ankle weights (AW): Hip flexion marches alternating x 1 minute; Hip abduction x 30s on each side; HS curls alternating x 1 minute; Hip extension x 30s on each side;   Neuromuscular Re-education All exercises performed without UE support unless otherwise specified; 6"serialhurdle stepsforward and lateral x 4 lengths each; Airex balance beam side steps x 6 lengths; Airex tandem balance alternating forward LE 2 x 30s each; Airex balance with horizontal and vertical head turns x 30s each;   Pt educated throughout session about proper posture and technique with exercises. Improved exercise technique, movement at target  joints, use of target muscles after min to mod verbal, visual, tactile cues.   Patient demonstrates excellent motivation during session today. Majority of session focused on strengthening today. She is able to exercise on the NuStep today without any reports of increase in her knee pain. Focused on sit to stand from chair with pads on seat to elevate surface. Pt is able to perform but with notable fatigue and challenge.Continued with balance exercises as well today but worked inside instead of outside on the grass. No HEP modifications made on this date however will review and follow-up sessions. Patient encouraged tocontinue currentHEP and follow-up as scheduled.Shewill benefit from PT services to address deficits in strength, balance, and mobility in order to return to full function at home.  PT Short Term Goals - 09/09/20 1015      PT SHORT TERM GOAL #1   Title Pt will be independent with initial HEP in order to improve strength and balance in order to decrease fall risk and improve function at home    Baseline to be administered next session    Time 4    Period Weeks    Status New    Target Date 10/07/20             PT Long Term Goals - 09/09/20 1017      PT LONG TERM GOAL #1   Title Pt will be independent with finalized HEP in order to improve strength and balance in order to decrease fall risk and improve function at home    Time 8    Period Weeks    Status New    Target Date 11/04/20      PT LONG TERM GOAL #2   Title Pt will improve BERG by at least 3 points in order to demonstrate clinically significant improvement in balance.    Baseline 42/56 on eval 3/18    Time 8    Period Weeks    Status New    Target Date 11/04/20      PT LONG TERM GOAL #3   Title Pt will decrease 5TSTS by at least 3 seconds in order to demonstrate clinically significant improvement in LE strength.    Baseline 35seconds with BUE support  on eval 3/18    Time 8    Period Weeks    Status New    Target Date 11/04/20      PT LONG TERM GOAL #4   Title Pt will improve DGI by at least 3 points in order to demonstrate clinically significant improvement in balance and decreased risk for falls.    Time 8    Period Weeks    Status New    Target Date 11/04/20      PT LONG TERM GOAL #5   Title Patient will demonstrate at 12 point improvement in her FOTO score indicating improved function and safety with functional activities.    Baseline 3/18 eval 43    Time 8    Period Weeks    Status New    Target Date 11/04/20      PT LONG TERM GOAL #6   Title The patient will demonstrate at least 1/2 grade improvement in LLE MMT indicating improved strength and ability to perform functional activities such as transfers, ambulation, stairs, curbs, etc    Baseline on eval 3/18 grossly 3+/5 for hip strength    Time 8    Period Weeks    Status New    Target Date 11/04/20                 Plan - 09/30/20 0957    Clinical Impression Statement Patient demonstrates excellent motivation during session today. Majority of session focused on strengthening today. She is able to exercise on the NuStep today without any reports of increase in her knee pain. Focused on sit to stand from chair with pads on seat to elevate surface. Pt is able to perform but with notable fatigue and challenge. Continued with balance exercises as well today but worked inside instead of outside on the grass. No HEP modifications made on this date however will review and follow-up sessions. Patient encouraged to continue current HEP and follow-up as scheduled.  She will benefit from PT services to address  deficits in strength, balance, and mobility in order to return to full function at home.    Personal Factors and Comorbidities Age;Comorbidity 3+;Fitness    Comorbidities afib, temporal arteritis, pacemaker (sees a cardiologist regularly), HTN, meningioma, CKDIII, NSTEMI,  chronic headaches, breast cancer, TIA about 4 years ago.    Examination-Activity Limitations Transfers;Bed Mobility;Locomotion Level;Stairs;Stand;Reach Overhead;Squat;Lift;Bend    Examination-Participation Restrictions Church;Laundry;Cleaning;Shop;Community Activity;Volunteer;Meal Prep;Interpersonal Relationship    Stability/Clinical Decision Making Stable/Uncomplicated    Rehab Potential Good    PT Frequency 2x / week    PT Duration 8 weeks    PT Treatment/Interventions Canalith Repostioning;ADLs/Self Care Home Management;Gait training;Stair training;Therapeutic activities;Therapeutic exercise;Balance training;Neuromuscular re-education;Patient/family education;Vestibular;Cryotherapy;Moist Heat;Electrical Stimulation;Splinting;Taping;Joint Manipulations;Spinal Manipulations;Dry needling;Energy conservation;Passive range of motion;Aquatic Therapy    PT Next Visit Plan balance and strength interventions    PT Home Exercise Plan Access Code: [email or web address]    Consulted and Agree with Plan of Care Patient           Patient will benefit from skilled therapeutic intervention in order to improve the following deficits and impairments:  Abnormal gait,Decreased balance,Decreased endurance,Difficulty walking,Cardiopulmonary status limiting activity,Decreased activity tolerance,Decreased strength  Visit Diagnosis: Difficulty in walking, not elsewhere classified  Muscle weakness (generalized)     Problem List Patient Active Problem List   Diagnosis Date Noted  . Intracranial carotid stenosis, right 05/29/2019  . Chronic headaches 05/29/2019  . Long term current use of systemic steroids 05/04/2019  . Screening for osteoporosis 05/04/2019  . Temporal arteritis ([place]) 04/07/2019  . Meningioma ([place]) 12/29/2018  . Chronic daily headache 11/20/2018  . Elevated erythrocyte sedimentation rate 11/05/2018  . CKD (chronic kidney disease) stage 3, GFR 30-59 ml/min ([facility]) 12/27/2017  . Shingles 05/06/2017  .  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 Primary osteoarthritis of right shoulder 12/07/2016  . Rotator cuff tear, right 12/07/2016  . Rotator cuff tendinitis, right 12/07/2016  . CHB (complete heart block) ([place]) 06/30/2016  . Severe mitral regurgitation 05/29/2016  . Pedal edema 04/23/2016  . Lung nodule, multiple 12/28/2015  . Amnesia, global, transient 04/05/2015  . 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 Cardiac pacemaker 05/18/2014  . Sleep disorder 05/06/2014  . Diffuse pain 04/06/2014  . Fatigue 03/16/2014  . Headache 03/16/2014  . History of melanoma 11/24/2013  . Melanoma ([place]) 11/24/2013  . DCIS (ductal carcinoma in situ) 08/11/2013  . History of ductal carcinoma in situ (DCIS) of breast 08/11/2013  . H/O non-ST elevation myocardial infarction (NSTEMI) 01/01/2013  . Hearing impairment 01/01/2013  . S/P ablation of atrial fibrillation 07/11/2012  . Edema 01/23/2012  . Hypertension 01/23/2012  . Atrial fibrillation ([place]) 11/27/2011  . Coronary artery disease 11/27/2011    Lyndel Safe Kaius Daino PT, DPT, GCS  Atanacio Melnyk 09/30/2020, 10:22 AM  [facility] [address] [place], [place], [address] Phone: [phone]   Fax:  [phone]  Name: Charlotte Glover MRN: [id number] Date of Birth: [birth year]

## 2020-10-03 ENCOUNTER — Ambulatory Visit: Payer: Medicare Other

## 2020-10-06 NOTE — Patient Instructions (Incomplete)
TREATMENT   Ther-ex NuStep L1-2 x 5 minutes for warm-up during history (2 minutes unbilled); Sit to stand without UE support from regular height chair with 2 Airex pads on seat x 10; Sit to stand without UE support from regular height chair with 1 Airex pad on seat x 7, pt unable to perform additional repetitions; Seated clams with green tband 2 x 1 minute; Seated adductor ball squeezes 3s hold 2 x 1 minute; Seated LAQ with 4# ankle weights x 1 minute;  Standing exercises with 4# ankle weights (AW): Hip flexion marches alternating x 1 minute; Hip abduction x 30s on each side; HS curls alternating x 1 minute; Hip extension x 30s on each side;   Neuromuscular Re-education All exercises performed without UE support unless otherwise specified; 6"serialhurdle stepsforward and lateral x 4 lengths each; Airex balance beam side steps x 6 lengths; Airex tandem balance alternating forward LE 2 x 30s each; Airex balance with horizontal and vertical head turns x 30s each;   Pt educated throughout session about proper posture and technique with exercises. Improved exercise technique, movement at target joints, use of target muscles after min to mod verbal, visual, tactile cues.   Patient demonstrates excellent motivation during session today. Majority of session focused on strengthening today. She is able to exercise on the NuStep today without any reports of increase in her knee pain. Focused on sit to stand from chair with pads on seat to elevate surface. Pt is able to perform but with notable fatigue and challenge.Continued with balance exercises as well today but worked inside instead of outside on the grass. No HEP modifications made on this date however will review and follow-up sessions. Patient encouraged tocontinue currentHEP and follow-up as scheduled.Shewill benefit from PT services to address deficits in strength, balance, and mobility in order to return to full  function at home.

## 2020-10-07 ENCOUNTER — Other Ambulatory Visit: Payer: Self-pay

## 2020-10-07 ENCOUNTER — Ambulatory Visit: Payer: Medicare Other

## 2020-10-07 DIAGNOSIS — M6281 Muscle weakness (generalized): Secondary | ICD-10-CM

## 2020-10-07 DIAGNOSIS — R262 Difficulty in walking, not elsewhere classified: Secondary | ICD-10-CM

## 2020-10-07 NOTE — Therapy (Signed)
[facility] [address] [place], [place], [address] Phone: [phone]   Fax:  [phone]  Physical Therapy Treatment  Patient Details  Name: Charlotte Glover MRN: [id number] Date of Birth: 1928/01/18 No data recorded  Encounter Date: 10/07/2020   PT End of Session - 10/07/20 0936    Visit Number 7    Number of Visits 16    Date for PT Re-Evaluation 11/04/20    Authorization Type medicare    PT Start Time 0930    PT Stop Time 1015    PT Time Calculation (min) 45 min    Equipment Utilized During Treatment Gait belt    Activity Tolerance Patient tolerated treatment well    Behavior During Therapy WFL for tasks assessed/performed           Past Medical History:  Diagnosis Date  . Arthritis   . Atrial fibrillation ([place])   . Breast cancer ([place]) 2015   DCIS  . Cancer ([place])   . Cardiac arrest ([place])   . CHF (congestive heart failure) ([place])   . Chickenpox   . Coronary artery disease   . Dyspnea   . Dysrhythmia   . Edema   . GERD (gastroesophageal reflux disease)   . Hearing aid worn    bilateral  . History of MI (myocardial infarction)   . HOH (hard of hearing)   . Hypertension 01/23/2012  . Myocardial infarction ([place]) 01/01/2013  . S/P ablation of atrial fibrillation   . Shingles     Past Surgical History:  Procedure Laterality Date  . ANTERIOR VITRECTOMY Left 03/20/2017   Procedure: ANTERIOR VITRECTOMY;  Surgeon: Leandrew Koyanagi, MD;  Location: [place];  Service: Ophthalmology;  Laterality: Left;  IVA TOPICAL LEFT  . ARTERY BIOPSY Right 04/08/2019   Procedure: BIOPSY TEMPORAL ARTERY;  Surgeon: Algernon Huxley, MD;  Location: [facility];  Service: Vascular;  Laterality: Right;  . ATRIAL ABLATION SURGERY    . BREAST BIOPSY Right 2015   + for DCIS   . BREAST SURGERY    . CARDIAC CATHETERIZATION    . CARDIAC CATHETERIZATION Bilateral 05/08/2016   Procedure: Right/Left Heart Cath and Coronary  Angiography;  Surgeon: Isaias Cowman, MD;  Location: [place] [facility];  Service: Cardiovascular;  Laterality: Bilateral;  . CATARACT EXTRACTION W/PHACO Left 03/20/2017   Procedure: IOL EXCHANGE;  Surgeon: Leandrew Koyanagi, MD;  Location: [place];  Service: Ophthalmology;  Laterality: Left;  . CHOLECYSTECTOMY    . DILATION AND CURETTAGE OF UTERUS    . ESOPHAGOGASTRODUODENOSCOPY    . PACEMAKER INSERTION    . PERIPHERAL IRIDOTOMY Left 03/20/2017   Procedure: PERIPHERAL IRIDECTOMY;  Surgeon: Leandrew Koyanagi, MD;  Location: [place];  Service: Ophthalmology;  Laterality: Left;  . ROTATOR CUFF REPAIR Left   . TEE WITHOUT CARDIOVERSION N/A 05/16/2016   Procedure: TRANSESOPHAGEAL ECHOCARDIOGRAM (TEE);  Surgeon: Teodoro Spray, MD;  Location: [facility];  Service: Cardiovascular;  Laterality: N/A;  . TONSILLECTOMY    . uterine polyp      There were no vitals filed for this visit.   Subjective Assessment - 10/07/20 0935    Subjective Pt reports that she is doing well today. She denies any falls or stumbles since last therapy session. She denies any R knee pain upon arrival but was having some pain in her knee this morning. No specific questions or concerns.    Pertinent History Pts main complaint is her balance. Has a fear  of falling but has not actually fallen. Stated she will tend to lean or start falling backwards, and reports some staggering in her gait path as well. Also reported she would like to work on her leg strength to make it easier to transfer like off/on the commode, out of chairs. Patient has been in therapy previously for vertigo as well as general conditioning. Has had issue with her balance for less than 5 years. Feels her balance has worsened in the last few weeks. Pt does have chronic R knee pain and R shoulder pain. Pt also reported chronic DOE, that she feels has worsened lately as well. No AD use in her home, but will use her [facility] or rollator for  community ambulation. Pt also mentioned some neck pain and potential radicular symptoms to RUE as well as chronic R rotator cuff tear.   PMH of afib, temporal arteritis, pacemaker (sees a cardiologist regularly), HTN, meningioma, CKDIII, NSTEMI, chronic headaches, breast cancer, TIA about 4 years ago.    How long can you sit comfortably? NA    How long can you stand comfortably? patient reported her R knee limits her ability to stand for long periods    How long can you walk comfortably? Pt feels winded by shorter distances now, for example from parking lot to store. Has to stop and catch her breath    Patient Stated Goals improve balance,    Currently in Pain? No/denies               TREATMENT   Ther-ex NuStep L1-2 x 5 minutes for warm-up during history (2 minutes unbilled); Seated marches with green tband around knees 2 x 45s; Seated clams with green tband 2 x 1 minute; Seated adductor ball squeezes 3s hold 2 x 1 minute;  Seated LAQ with 4# ankle weights x 1 minute;   Neuromuscular Re-education All exercises performed without UE support unless otherwise specified; 1/2 foam roll static balance x 1 minute; 1/2 foam roll heel/toe rocking x 1 minute; Airex balance beam tandem gait x 4 lengths; Airex balance beam side stepping x 6 lengths;  Gait outside on grass/mulch: Forward/backwards x 1 minute each; Lateral stepping x 1 minute each direction; Intermittent rest breaks;   Pt educated throughout session about proper posture and technique with exercises. Improved exercise technique, movement at target joints, use of target muscles after min to mod verbal, visual, tactile cues.   Patient demonstrates excellent motivation during session today. Majority of session focused on balance today however some strengthening included at the end of the session. Practiced gait on uneven surfaces by working outside today on the grass. She finds this very challenging and eventually  has to stop due to some increase in her knee pain. No HEP modifications made on this date however will review at follow-up sessions. Patient encouraged tocontinue currentHEP and follow-up as scheduled.Shewill benefit from PT services to address deficits in strength, balance, and mobility in order to return to full function at home.                         PT Short Term Goals - 09/09/20 1015      PT SHORT TERM GOAL #1   Title Pt will be independent with initial HEP in order to improve strength and balance in order to decrease fall risk and improve function at home    Baseline to be administered next session    Time 4    Period  Weeks    Status New    Target Date 10/07/20             PT Long Term Goals - 09/09/20 1017      PT LONG TERM GOAL #1   Title Pt will be independent with finalized HEP in order to improve strength and balance in order to decrease fall risk and improve function at home    Time 8    Period Weeks    Status New    Target Date 11/04/20      PT LONG TERM GOAL #2   Title Pt will improve BERG by at least 3 points in order to demonstrate clinically significant improvement in balance.    Baseline 42/56 on eval 3/18    Time 8    Period Weeks    Status New    Target Date 11/04/20      PT LONG TERM GOAL #3   Title Pt will decrease 5TSTS by at least 3 seconds in order to demonstrate clinically significant improvement in LE strength.    Baseline 35seconds with BUE support on eval 3/18    Time 8    Period Weeks    Status New    Target Date 11/04/20      PT LONG TERM GOAL #4   Title Pt will improve DGI by at least 3 points in order to demonstrate clinically significant improvement in balance and decreased risk for falls.    Time 8    Period Weeks    Status New    Target Date 11/04/20      PT LONG TERM GOAL #5   Title Patient will demonstrate at 12 point improvement in her FOTO score indicating improved function and safety with  functional activities.    Baseline 3/18 eval 43    Time 8    Period Weeks    Status New    Target Date 11/04/20      PT LONG TERM GOAL #6   Title The patient will demonstrate at least 1/2 grade improvement in LLE MMT indicating improved strength and ability to perform functional activities such as transfers, ambulation, stairs, curbs, etc    Baseline on eval 3/18 grossly 3+/5 for hip strength    Time 8    Period Weeks    Status New    Target Date 11/04/20                 Plan - 10/07/20 3149    Clinical Impression Statement Patient demonstrates excellent motivation during session today. Majority of session focused on balance today however some strengthening included at the end of the session. Practiced gait on uneven surfaces by working outside today on the grass. She finds this very challenging and eventually has to stop due to some increase in her knee pain. No HEP modifications made on this date however will review at follow-up sessions. Patient encouraged to continue current HEP and follow-up as scheduled.  She will benefit from PT services to address deficits in strength, balance, and mobility in order to return to full function at home.    Personal Factors and Comorbidities Age;Comorbidity 3+;Fitness    Comorbidities afib, temporal arteritis, pacemaker (sees a cardiologist regularly), HTN, meningioma, CKDIII, NSTEMI, chronic headaches, breast cancer, TIA about 4 years ago.    Examination-Activity Limitations Transfers;Bed Mobility;Locomotion Level;Stairs;Stand;Reach Overhead;Squat;Lift;Bend    Examination-Participation Restrictions Church;Laundry;Cleaning;Shop;Community Activity;Volunteer;Meal Prep;Interpersonal Relationship    Stability/Clinical Decision Making Stable/Uncomplicated    Rehab Potential Good  PT Frequency 2x / week    PT Duration 8 weeks    PT Treatment/Interventions Canalith Repostioning;ADLs/Self Care Home Management;Gait training;Stair training;Therapeutic  activities;Therapeutic exercise;Balance training;Neuromuscular re-education;Patient/family education;Vestibular;Cryotherapy;Moist Heat;Electrical Stimulation;Splinting;Taping;Joint Manipulations;Spinal Manipulations;Dry needling;Energy conservation;Passive range of motion;Aquatic Therapy    PT Next Visit Plan balance and strength interventions    PT Home Exercise Plan Access Code: [email or web address]    Consulted and Agree with Plan of Care Patient           Patient will benefit from skilled therapeutic intervention in order to improve the following deficits and impairments:  Abnormal gait,Decreased balance,Decreased endurance,Difficulty walking,Cardiopulmonary status limiting activity,Decreased activity tolerance,Decreased strength  Visit Diagnosis: Difficulty in walking, not elsewhere classified  Muscle weakness (generalized)     Problem List Patient Active Problem List   Diagnosis Date Noted  . Intracranial carotid stenosis, right 05/29/2019  . Chronic headaches 05/29/2019  . Long term current use of systemic steroids 05/04/2019  . Screening for osteoporosis 05/04/2019  . Temporal arteritis ([place]) 04/07/2019  . Meningioma ([place]) 12/29/2018  . Chronic daily headache 11/20/2018  . Elevated erythrocyte sedimentation rate 11/05/2018  . CKD (chronic kidney disease) stage 3, GFR 30-59 ml/min (H[facility]) 12/27/2017  . Shingles 05/06/2017  . 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 Primary osteoarthritis of right shoulder 12/07/2016  . Rotator cuff tear, right 12/07/2016  . Rotator cuff tendinitis, right 12/07/2016  . CHB (complete heart block) ([place]) 06/30/2016  . Severe mitral regurgitation 05/29/2016  . Pedal edema 04/23/2016  . Lung nodule, multiple 12/28/2015  . Amnesia, global, transient 04/05/2015  . 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 Cardiac pacemaker 05/18/2014  . Sleep disorder 05/06/2014  . Diffuse pain  04/06/2014  . Fatigue 03/16/2014  . Headache 03/16/2014  . History of melanoma 11/24/2013  . Melanoma ([place]) 11/24/2013  . DCIS (ductal carcinoma in situ) 08/11/2013  . History of ductal carcinoma in situ (DCIS) of breast 08/11/2013  . H/O non-ST elevation myocardial infarction (NSTEMI) 01/01/2013  . Hearing impairment 01/01/2013  . S/P ablation of atrial fibrillation 07/11/2012  . Edema 01/23/2012  . Hypertension 01/23/2012  . Atrial fibrillation ([place]) 11/27/2011  . Coronary artery disease 11/27/2011   Lyndel Safe Anaijah Augsburger PT, DPT, GCS  [place] 10/07/2020, 10:29 AM  [facility] [address] [place], [place], [address] Phone: [phone]   Fax:  [phone]  Name: Maurie Musco MRN: [id number] Date of Birth: 1927/10/07

## 2020-10-10 ENCOUNTER — Ambulatory Visit: Payer: Medicare Other

## 2020-10-10 NOTE — Patient Instructions (Incomplete)
TREATMENT   Ther-ex NuStep L1-2 x 5 minutes for warm-up during history (2 minutes unbilled); Seated marches with green tband around knees 2 x 45s; Seated clams with green tband 2 x 1 minute; Seated adductor ball squeezes 3s hold 2 x 1 minute;  Seated LAQ with 4# ankle weights x 1 minute;   Neuromuscular Re-education All exercises performed without UE support unless otherwise specified; 1/2 foam roll static balance x 1 minute; 1/2 foam roll heel/toe rocking x 1 minute; Airex balance beam tandem gait x 4 lengths; Airex balance beam side stepping x 6 lengths;  Gait outside on grass/mulch: Forward/backwards x 1 minute each; Lateral stepping x 1 minute each direction; Intermittent rest breaks;   Pt educated throughout session about proper posture and technique with exercises. Improved exercise technique, movement at target joints, use of target muscles after min to mod verbal, visual, tactile cues.   Patient demonstrates excellent motivation during session today.Majority of session focused on balance today however some strengthening included at the end of the session. Practiced gait on uneven surfaces by working outside today on the grass. She finds this very challenging and eventually has to stop due to some increase in her knee pain. No HEP modifications made on this date however will review at follow-up sessions. Patient encouraged tocontinue currentHEP and follow-up as scheduled.Shewill benefit from PT services to address deficits in strength, balance, and mobility in order to return to full function at home.

## 2020-10-14 ENCOUNTER — Ambulatory Visit: Payer: Medicare Other

## 2020-10-17 ENCOUNTER — Ambulatory Visit: Payer: Medicare Other

## 2020-10-21 ENCOUNTER — Other Ambulatory Visit: Payer: Self-pay

## 2020-10-21 ENCOUNTER — Ambulatory Visit: Payer: Medicare Other

## 2020-10-21 DIAGNOSIS — R262 Difficulty in walking, not elsewhere classified: Secondary | ICD-10-CM

## 2020-10-21 DIAGNOSIS — M6281 Muscle weakness (generalized): Secondary | ICD-10-CM

## 2020-10-21 NOTE — Patient Instructions (Addendum)
TREATMENT   Ther-ex NuStep L1-2 x 2.5 minutes for warm-up during history, discontinued due to R hip and L knee pain;  Side stepping with 4# ankle weights x 6 lengths;  Seated LAQ with 4# ankle weights x 1 minute; Seated clams with green tband 2 x 1 minute; Seated adductor ball squeezes 3s hold 2 x 1 minute;  Double black tband resisted forward and backward gait x 10 each;   Neuromuscular Re-education All exercises performed without UE support unless otherwise specified;  Blue mat 6" hurdle steps x 8 lengths; Blue mat tandem balance alternating forward LE x 30s each; Blue mat feet together eyes closed x 30s;  1/2 foam roll static balance x 1 minute; 1/2 foam roll heel/toe rocking x 1 minute;  Intermittent rest breaks;   Pt educated throughout session about proper posture and technique with exercises. Improved exercise technique, movement at target joints, use of target muscles after min to mod verbal, visual, tactile cues.   Patient demonstrates excellent motivation during session today. Session focused on both balance and strengthening.  She reports some right hip and left knee pain during NuStep so discontinued.  She continues demonstrate difficulty on uneven surfaces however improved balance today and tandem stance on blue mat and with heel toe rocking on half foam roll.  Overall she making excellent progress towards her goals to improve balance and decrease fall risk.  No HEP modifications made on this date however will review atfollow-up sessions. Patient encouraged tocontinue currentHEP and follow-up as scheduled.Shewill benefit from PT services to address deficits in strength, balance, and mobility in order to return to full function at home.

## 2020-10-21 NOTE — Therapy (Signed)
[facility] [address] [place], [place], [address] Phone: [phone]   Fax:  [phone]  Physical Therapy Treatment  Patient Details  Name: Charlotte Glover MRN: [id number] Date of Birth: July 13, 1927 No data recorded  Encounter Date: 10/21/2020   PT End of Session - 10/21/20 1100    Visit Number 8    Number of Visits 16    Date for PT Re-Evaluation 11/04/20    Authorization Type medicare    PT Start Time 1100    PT Stop Time 1145    PT Time Calculation (min) 45 min    Equipment Utilized During Treatment Gait belt    Activity Tolerance Patient tolerated treatment well    Behavior During Therapy WFL for tasks assessed/performed           Past Medical History:  Diagnosis Date  . Arthritis   . Atrial fibrillation ([place])   . Breast cancer ([place]) 2015   DCIS  . Cancer ([place])   . Cardiac arrest ([place])   . CHF (congestive heart failure) ([place])   . Chickenpox   . Coronary artery disease   . Dyspnea   . Dysrhythmia   . Edema   . GERD (gastroesophageal reflux disease)   . Hearing aid worn    bilateral  . History of MI (myocardial infarction)   . HOH (hard of hearing)   . Hypertension 01/23/2012  . Myocardial infarction ([place]) 01/01/2013  . S/P ablation of atrial fibrillation   . Shingles     Past Surgical History:  Procedure Laterality Date  . ANTERIOR VITRECTOMY Left 03/20/2017   Procedure: ANTERIOR VITRECTOMY;  Surgeon: Leandrew Koyanagi, MD;  Location: [facility];  Service: Ophthalmology;  Laterality: Left;  IVA TOPICAL LEFT  . ARTERY BIOPSY Right 04/08/2019   Procedure: BIOPSY TEMPORAL ARTERY;  Surgeon: Algernon Huxley, MD;  Location: [facility];  Service: Vascular;  Laterality: Right;  . ATRIAL ABLATION SURGERY    . BREAST BIOPSY Right 2015   + for DCIS   . BREAST SURGERY    . CARDIAC CATHETERIZATION    . CARDIAC CATHETERIZATION Bilateral 05/08/2016   Procedure: Right/Left Heart Cath and Coronary  Angiography;  Surgeon: Isaias Cowman, MD;  Location: [facility];  Service: Cardiovascular;  Laterality: Bilateral;  . CATARACT EXTRACTION W/PHACO Left 03/20/2017   Procedure: IOL EXCHANGE;  Surgeon: Leandrew Koyanagi, MD;  Location: [facility];  Service: Ophthalmology;  Laterality: Left;  . CHOLECYSTECTOMY    . DILATION AND CURETTAGE OF UTERUS    . ESOPHAGOGASTRODUODENOSCOPY    . PACEMAKER INSERTION    . PERIPHERAL IRIDOTOMY Left 03/20/2017   Procedure: PERIPHERAL IRIDECTOMY;  Surgeon: Leandrew Koyanagi, MD;  Location: [facility];  Service: Ophthalmology;  Laterality: Left;  . ROTATOR CUFF REPAIR Left   . TEE WITHOUT CARDIOVERSION N/A 05/16/2016   Procedure: TRANSESOPHAGEAL ECHOCARDIOGRAM (TEE);  Surgeon: Teodoro Spray, MD;  Location: [facility];  Service: Cardiovascular;  Laterality: N/A;  . TONSILLECTOMY    . uterine polyp      There were no vitals filed for this visit.   Subjective Assessment - 10/21/20 1058    Subjective Pt reports that she is doing well today. She denies any falls or stumbles since last therapy session. She denies any resting pain upon arrival but has been having some R hip and L knee pain earlier. No specific questions or concerns.    Pertinent History Pts main complaint is her balance. Has a  fear of falling but has not actually fallen. Stated she will tend to lean or start falling backwards, and reports some staggering in her gait path as well. Also reported she would like to work on her leg strength to make it easier to transfer like off/on the commode, out of chairs. Patient has been in therapy previously for vertigo as well as general conditioning. Has had issue with her balance for less than 5 years. Feels her balance has worsened in the last few weeks. Pt does have chronic R knee pain and R shoulder pain. Pt also reported chronic DOE, that she feels has worsened lately as well. No AD use in her home, but will use her [facility] or  rollator for community ambulation. Pt also mentioned some neck pain and potential radicular symptoms to RUE as well as chronic R rotator cuff tear.   PMH of afib, temporal arteritis, pacemaker (sees a cardiologist regularly), HTN, meningioma, CKDIII, NSTEMI, chronic headaches, breast cancer, TIA about 4 years ago.    How long can you sit comfortably? NA    How long can you stand comfortably? patient reported her R knee limits her ability to stand for long periods    How long can you walk comfortably? Pt feels winded by shorter distances now, for example from parking lot to store. Has to stop and catch her breath    Patient Stated Goals improve balance,    Currently in Pain? No/denies                 TREATMENT   Ther-ex NuStep L1-2 x 2.5 minutes for warm-up during history, discontinued due to R hip and L knee pain;  Side stepping with 4# ankle weights x 6 lengths;  Seated LAQ with 4# ankle weights x 1 minute; Seated clams with green tband 2 x 1 minute; Seated adductor ball squeezes 3s hold 2 x 1 minute;  Double black tband resisted forward and backward gait x 10 each;   Neuromuscular Re-education All exercises performed without UE support unless otherwise specified;  Blue mat 6" hurdle steps x 8 lengths; Blue mat tandem balance alternating forward LE x 30s each; Blue mat feet together eyes closed x 30s;  1/2 foam roll static balance x 1 minute; 1/2 foam roll heel/toe rocking x 1 minute;  Intermittent rest breaks;   Pt educated throughout session about proper posture and technique with exercises. Improved exercise technique, movement at target joints, use of target muscles after min to mod verbal, visual, tactile cues.   Patient demonstrates excellent motivation during session today. Session focused on both balance and strengthening.   She reports some right hip and left knee pain during NuStep so discontinued.  She continues demonstrate difficulty on  uneven surfaces however improved balance today and tandem stance on blue mat and with heel toe rocking on half foam roll.  Overall she making excellent progress towards her goals to improve balance and decrease fall risk.  No HEP modifications made on this date however will review at follow-up sessions. Patient encouraged tocontinue currentHEP and follow-up as scheduled.Shewill benefit from PT services to address deficits in strength, balance, and mobility in order to return to full function at home.                       PT Short Term Goals - 09/09/20 1015      PT SHORT TERM GOAL #1   Title Pt will be independent with initial HEP in order  to improve strength and balance in order to decrease fall risk and improve function at home    Baseline to be administered next session    Time 4    Period Weeks    Status New    Target Date 10/07/20             PT Long Term Goals - 09/09/20 1017      PT LONG TERM GOAL #1   Title Pt will be independent with finalized HEP in order to improve strength and balance in order to decrease fall risk and improve function at home    Time 8    Period Weeks    Status New    Target Date 11/04/20      PT LONG TERM GOAL #2   Title Pt will improve BERG by at least 3 points in order to demonstrate clinically significant improvement in balance.    Baseline 42/56 on eval 3/18    Time 8    Period Weeks    Status New    Target Date 11/04/20      PT LONG TERM GOAL #3   Title Pt will decrease 5TSTS by at least 3 seconds in order to demonstrate clinically significant improvement in LE strength.    Baseline 35seconds with BUE support on eval 3/18    Time 8    Period Weeks    Status New    Target Date 11/04/20      PT LONG TERM GOAL #4   Title Pt will improve DGI by at least 3 points in order to demonstrate clinically significant improvement in balance and decreased risk for falls.    Time 8    Period Weeks    Status New    Target  Date 11/04/20      PT LONG TERM GOAL #5   Title Patient will demonstrate at 12 point improvement in her FOTO score indicating improved function and safety with functional activities.    Baseline 3/18 eval 43    Time 8    Period Weeks    Status New    Target Date 11/04/20      PT LONG TERM GOAL #6   Title The patient will demonstrate at least 1/2 grade improvement in LLE MMT indicating improved strength and ability to perform functional activities such as transfers, ambulation, stairs, curbs, etc    Baseline on eval 3/18 grossly 3+/5 for hip strength    Time 8    Period Weeks    Status New    Target Date 11/04/20                 Plan - 10/21/20 1101    Clinical Impression Statement Patient demonstrates excellent motivation during session today.  Session focused on both balance and strengthening.   She reports some right hip and left knee pain during NuStep so discontinued.  She continues demonstrate difficulty on uneven surfaces however improved balance today and tandem stance on blue mat and with heel toe rocking on half foam roll.  Overall she making excellent progress towards her goals to improve balance and decrease fall risk.  No HEP modifications made on this date however will review at follow-up sessions. Patient encouraged to continue current HEP and follow-up as scheduled.  She will benefit from PT services to address deficits in strength, balance, and mobility in order to return to full function at home.    Personal Factors and Comorbidities Age;Comorbidity 3+;Fitness    Comorbidities  afib, temporal arteritis, pacemaker (sees a cardiologist regularly), HTN, meningioma, CKDIII, NSTEMI, chronic headaches, breast cancer, TIA about 4 years ago.    Examination-Activity Limitations Transfers;Bed Mobility;Locomotion Level;Stairs;Stand;Reach Overhead;Squat;Lift;Bend    Examination-Participation Restrictions Church;Laundry;Cleaning;Shop;Community Activity;Volunteer;Meal  Prep;Interpersonal Relationship    Stability/Clinical Decision Making Stable/Uncomplicated    Rehab Potential Good    PT Frequency 2x / week    PT Duration 8 weeks    PT Treatment/Interventions Canalith Repostioning;ADLs/Self Care Home Management;Gait training;Stair training;Therapeutic activities;Therapeutic exercise;Balance training;Neuromuscular re-education;Patient/family education;Vestibular;Cryotherapy;Moist Heat;Electrical Stimulation;Splinting;Taping;Joint Manipulations;Spinal Manipulations;Dry needling;Energy conservation;Passive range of motion;Aquatic Therapy    PT Next Visit Plan balance and strength interventions    PT Home Exercise Plan Access Code: [email or web address]    Consulted and Agree with Plan of Care Patient           Patient will benefit from skilled therapeutic intervention in order to improve the following deficits and impairments:  Abnormal gait,Decreased balance,Decreased endurance,Difficulty walking,Cardiopulmonary status limiting activity,Decreased activity tolerance,Decreased strength  Visit Diagnosis: Difficulty in walking, not elsewhere classified  Muscle weakness (generalized)     Problem List Patient Active Problem List   Diagnosis Date Noted  . Intracranial carotid stenosis, right 05/29/2019  . Chronic headaches 05/29/2019  . Long term current use of systemic steroids 05/04/2019  . Screening for osteoporosis 05/04/2019  . Temporal arteritis ([place]) 04/07/2019  . Meningioma ([place]) 12/29/2018  . Chronic daily headache 11/20/2018  . Elevated erythrocyte sedimentation rate 11/05/2018  . CKD (chronic kidney disease) stage 3, GFR 30-59 ml/min ([facility]) 12/27/2017  . Shingles 05/06/2017  . 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 Primary osteoarthritis of right shoulder 12/07/2016  . Rotator cuff tear, right 12/07/2016  . Rotator cuff tendinitis, right 12/07/2016  . CHB (complete heart block) ([place]) 06/30/2016  . Severe mitral regurgitation  05/29/2016  . Pedal edema 04/23/2016  . Lung nodule, multiple 12/28/2015  . Amnesia, global, transient 04/05/2015  . 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 Cardiac pacemaker 05/18/2014  . Sleep disorder 05/06/2014  . Diffuse pain 04/06/2014  . Fatigue 03/16/2014  . Headache 03/16/2014  . History of melanoma 11/24/2013  . Melanoma ([place]) 11/24/2013  . DCIS (ductal carcinoma in situ) 08/11/2013  . History of ductal carcinoma in situ (DCIS) of breast 08/11/2013  . H/O non-ST elevation myocardial infarction (NSTEMI) 01/01/2013  . Hearing impairment 01/01/2013  . S/P ablation of atrial fibrillation 07/11/2012  . Edema 01/23/2012  . Hypertension 01/23/2012  . Atrial fibrillation ([place]) 11/27/2011  . Coronary artery disease 11/27/2011   Lyndel Safe Jarrell Armond PT, DPT, GCS  Kamile Fassler 10/21/2020, 12:20 PM  [facility] [address] [place], [place], [address] Phone: [phone]   Fax:  [phone]  Name: Chrysta Fulcher MRN: [id number] Date of Birth: 1927/08/26

## 2020-10-24 ENCOUNTER — Ambulatory Visit: Payer: Medicare Other | Attending: Internal Medicine

## 2020-10-24 ENCOUNTER — Other Ambulatory Visit: Payer: Self-pay

## 2020-10-24 DIAGNOSIS — R262 Difficulty in walking, not elsewhere classified: Secondary | ICD-10-CM | POA: Insufficient documentation

## 2020-10-24 DIAGNOSIS — M6281 Muscle weakness (generalized): Secondary | ICD-10-CM | POA: Diagnosis present

## 2020-10-24 NOTE — Therapy (Signed)
[facility] [address] [place], [place], [address] Phone: [phone]   Fax:  [phone]  Physical Therapy Treatment  Patient Details  Name: Charlotte Glover MRN: [id number] Date of Birth: 1928-03-29 No data recorded  Encounter Date: 10/24/2020   PT End of Session - 10/24/20 1333    Visit Number 9    Number of Visits 16    Date for PT Re-Evaluation 11/04/20    Authorization Type medicare    PT Start Time 1103    PT Stop Time 1145    PT Time Calculation (min) 42 min    Equipment Utilized During Treatment Gait belt    Activity Tolerance Patient tolerated treatment well    Behavior During Therapy WFL for tasks assessed/performed           Past Medical History:  Diagnosis Date  . Arthritis   . Atrial fibrillation ([place])   . Breast cancer ([place]) 2015   DCIS  . Cancer ([place])   . Cardiac arrest ([place])   . CHF (congestive heart failure) ([place])   . Chickenpox   . Coronary artery disease   . Dyspnea   . Dysrhythmia   . Edema   . GERD (gastroesophageal reflux disease)   . Hearing aid worn    bilateral  . History of MI (myocardial infarction)   . HOH (hard of hearing)   . Hypertension 01/23/2012  . Myocardial infarction ([place]) 01/01/2013  . S/P ablation of atrial fibrillation   . Shingles     Past Surgical History:  Procedure Laterality Date  . ANTERIOR VITRECTOMY Left 03/20/2017   Procedure: ANTERIOR VITRECTOMY;  Surgeon: Leandrew Koyanagi, MD;  Location: [place];  Service: Ophthalmology;  Laterality: Left;  IVA TOPICAL LEFT  . ARTERY BIOPSY Right 04/08/2019   Procedure: BIOPSY TEMPORAL ARTERY;  Surgeon: Algernon Huxley, MD;  Location: [facility];  Service: Vascular;  Laterality: Right;  . ATRIAL ABLATION SURGERY    . BREAST BIOPSY Right 2015   + for DCIS   . BREAST SURGERY    . CARDIAC CATHETERIZATION    . CARDIAC CATHETERIZATION Bilateral 05/08/2016   Procedure: Right/Left Heart Cath and Coronary  Angiography;  Surgeon: Isaias Cowman, MD;  Location: [place] [facility];  Service: Cardiovascular;  Laterality: Bilateral;  . CATARACT EXTRACTION W/PHACO Left 03/20/2017   Procedure: IOL EXCHANGE;  Surgeon: Leandrew Koyanagi, MD;  Location: [place];  Service: Ophthalmology;  Laterality: Left;  . CHOLECYSTECTOMY    . DILATION AND CURETTAGE OF UTERUS    . ESOPHAGOGASTRODUODENOSCOPY    . PACEMAKER INSERTION    . PERIPHERAL IRIDOTOMY Left 03/20/2017   Procedure: PERIPHERAL IRIDECTOMY;  Surgeon: Leandrew Koyanagi, MD;  Location: [facility];  Service: Ophthalmology;  Laterality: Left;  . ROTATOR CUFF REPAIR Left   . TEE WITHOUT CARDIOVERSION N/A 05/16/2016   Procedure: TRANSESOPHAGEAL ECHOCARDIOGRAM (TEE);  Surgeon: Teodoro Spray, MD;  Location: [facility];  Service: Cardiovascular;  Laterality: N/A;  . TONSILLECTOMY    . uterine polyp      There were no vitals filed for this visit.   Subjective Assessment - 10/24/20 1109    Subjective Pt reports that she is doing well today. She denies any falls or stumbles since last therapy session. She denies any resting pain upon arrival but did have some lateral R hip pain when she first woke up this morning which has since resolved. No specific questions or concerns.    Pertinent History Pts  main complaint is her balance. Has a fear of falling but has not actually fallen. Stated she will tend to lean or start falling backwards, and reports some staggering in her gait path as well. Also reported she would like to work on her leg strength to make it easier to transfer like off/on the commode, out of chairs. Patient has been in therapy previously for vertigo as well as general conditioning. Has had issue with her balance for less than 5 years. Feels her balance has worsened in the last few weeks. Pt does have chronic R knee pain and R shoulder pain. Pt also reported chronic DOE, that she feels has worsened lately as well. No AD use  in her home, but will use her [facility] or rollator for community ambulation. Pt also mentioned some neck pain and potential radicular symptoms to RUE as well as chronic R rotator cuff tear.   PMH of afib, temporal arteritis, pacemaker (sees a cardiologist regularly), HTN, meningioma, CKDIII, NSTEMI, chronic headaches, breast cancer, TIA about 4 years ago.    How long can you sit comfortably? NA    How long can you stand comfortably? patient reported her R knee limits her ability to stand for long periods    How long can you walk comfortably? Pt feels winded by shorter distances now, for example from parking lot to store. Has to stop and catch her breath    Patient Stated Goals improve balance,    Currently in Pain? No/denies                TREATMENT   Ther-ex NuStep L1-4 x 58minutes for warm-up during history, therapist adjusting resistance throughout to challenge patient based on difficulty, pt denies R hip and L knee pain;  Side stepping with 4# ankle weights x 6 lengths;  Seated LAQ with 4# ankle weights x 1 minute;  Standing exercises with 4# ankle weights (AW): Hip flexion marches x 1 minute; Hip abduction x 1 minute; HS curls x 1 minute;   Sit to stand without UE support from regular height chair with Airex pad on seat x 10, x 6, second sit discontinued secondary to pain;  6" alternating step-ups without UE support x 5 on each side, unable to perform additional due to increase in R knee pain;  Double black tband resisted forward and backward gait x 10 each;   Neuromuscular Re-education All exercises performed without UE support unless otherwise specified;  Airex tandem balance alternating forward LE 2 x 30s each; Airex balance beam side stepping x 6 lengths; Airex balance beam tandem gait x 6 lengths;  Intermittent rest breaks;   Pt educated throughout session about proper posture and technique with exercises. Improved exercise technique, movement at  target joints, use of target muscles after min to mod verbal, visual, tactile cues.   Patient demonstrates excellent motivation during session today. Session focused on both balance and strengthening but more time devoted to strengthening today. She reports some mild hip pain during second set of sit to stands so discontinued. She is able to progress strengthening exercises during session and performed balance exercises on unstable Airex pad. Overall she making excellent progress towards her goals to improve balance and decrease fall risk.  Will update outcome measures, goals, and progress note at next visit. No HEP modifications made on this date. Patient encouraged tocontinue currentHEP and follow-up as scheduled.Shewill benefit from PT services to address deficits in strength, balance, and mobility in order to return to full function  at home.                          PT Short Term Goals - 09/09/20 1015      PT SHORT TERM GOAL #1   Title Pt will be independent with initial HEP in order to improve strength and balance in order to decrease fall risk and improve function at home    Baseline to be administered next session    Time 4    Period Weeks    Status New    Target Date 10/07/20             PT Long Term Goals - 09/09/20 1017      PT LONG TERM GOAL #1   Title Pt will be independent with finalized HEP in order to improve strength and balance in order to decrease fall risk and improve function at home    Time 8    Period Weeks    Status New    Target Date 11/04/20      PT LONG TERM GOAL #2   Title Pt will improve BERG by at least 3 points in order to demonstrate clinically significant improvement in balance.    Baseline 42/56 on eval 3/18    Time 8    Period Weeks    Status New    Target Date 11/04/20      PT LONG TERM GOAL #3   Title Pt will decrease 5TSTS by at least 3 seconds in order to demonstrate clinically significant improvement in  LE strength.    Baseline 35seconds with BUE support on eval 3/18    Time 8    Period Weeks    Status New    Target Date 11/04/20      PT LONG TERM GOAL #4   Title Pt will improve DGI by at least 3 points in order to demonstrate clinically significant improvement in balance and decreased risk for falls.    Time 8    Period Weeks    Status New    Target Date 11/04/20      PT LONG TERM GOAL #5   Title Patient will demonstrate at 12 point improvement in her FOTO score indicating improved function and safety with functional activities.    Baseline 3/18 eval 43    Time 8    Period Weeks    Status New    Target Date 11/04/20      PT LONG TERM GOAL #6   Title The patient will demonstrate at least 1/2 grade improvement in LLE MMT indicating improved strength and ability to perform functional activities such as transfers, ambulation, stairs, curbs, etc    Baseline on eval 3/18 grossly 3+/5 for hip strength    Time 8    Period Weeks    Status New    Target Date 11/04/20                 Plan - 10/24/20 1334    Clinical Impression Statement Patient demonstrates excellent motivation during session today.  Session focused on both balance and strengthening but more time devoted to strengthening today. She reports some mild hip pain during second set of sit to stands so discontinued.  She is able to progress strengthening exercises during session and performed balance exercises on unstable Airex pad. Overall she making excellent progress towards her goals to improve balance and decrease fall risk.  Will update outcome measures, goals, and progress  note at next visit. No HEP modifications made on this date. Patient encouraged to continue current HEP and follow-up as scheduled.  She will benefit from PT services to address deficits in strength, balance, and mobility in order to return to full function at home.    Personal Factors and Comorbidities Age;Comorbidity 3+;Fitness    Comorbidities  afib, temporal arteritis, pacemaker (sees a cardiologist regularly), HTN, meningioma, CKDIII, NSTEMI, chronic headaches, breast cancer, TIA about 4 years ago.    Examination-Activity Limitations Transfers;Bed Mobility;Locomotion Level;Stairs;Stand;Reach Overhead;Squat;Lift;Bend    Examination-Participation Restrictions Church;Laundry;Cleaning;Shop;Community Activity;Volunteer;Meal Prep;Interpersonal Relationship    Stability/Clinical Decision Making Stable/Uncomplicated    Rehab Potential Good    PT Frequency 2x / week    PT Duration 8 weeks    PT Treatment/Interventions Canalith Repostioning;ADLs/Self Care Home Management;Gait training;Stair training;Therapeutic activities;Therapeutic exercise;Balance training;Neuromuscular re-education;Patient/family education;Vestibular;Cryotherapy;Moist Heat;Electrical Stimulation;Splinting;Taping;Joint Manipulations;Spinal Manipulations;Dry needling;Energy conservation;Passive range of motion;Aquatic Therapy    PT Next Visit Plan Update outcome measures, progress note, balance and strength interventions    PT Home Exercise Plan Access Code: [email or web address]    Consulted and Agree with Plan of Care Patient           Patient will benefit from skilled therapeutic intervention in order to improve the following deficits and impairments:  Abnormal gait,Decreased balance,Decreased endurance,Difficulty walking,Cardiopulmonary status limiting activity,Decreased activity tolerance,Decreased strength  Visit Diagnosis: Difficulty in walking, not elsewhere classified  Muscle weakness (generalized)     Problem List Patient Active Problem List   Diagnosis Date Noted  . Intracranial carotid stenosis, right 05/29/2019  . Chronic headaches 05/29/2019  . Long term current use of systemic steroids 05/04/2019  . Screening for osteoporosis 05/04/2019  . Temporal arteritis ([place]) 04/07/2019  . Meningioma ([place]) 12/29/2018  . Chronic daily headache 11/20/2018  . Elevated  erythrocyte sedimentation rate 11/05/2018  . CKD (chronic kidney disease) stage 3, GFR 30-59 ml/min (HCC) 12/27/2017  . Shingles 05/06/2017  . 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 Primary osteoarthritis of right shoulder 12/07/2016  . Rotator cuff tear, right 12/07/2016  . Rotator cuff tendinitis, right 12/07/2016  . CHB (complete heart block) ([place]) 06/30/2016  . Severe mitral regurgitation 05/29/2016  . Pedal edema 04/23/2016  . Lung nodule, multiple 12/28/2015  . Amnesia, global, transient 04/05/2015  . 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 Cardiac pacemaker 05/18/2014  . Sleep disorder 05/06/2014  . Diffuse pain 04/06/2014  . Fatigue 03/16/2014  . Headache 03/16/2014  . History of melanoma 11/24/2013  . Melanoma ([place]) 11/24/2013  . DCIS (ductal carcinoma in situ) 08/11/2013  . History of ductal carcinoma in situ (DCIS) of breast 08/11/2013  . H/O non-ST elevation myocardial infarction (NSTEMI) 01/01/2013  . Hearing impairment 01/01/2013  . S/P ablation of atrial fibrillation 07/11/2012  . Edema 01/23/2012  . Hypertension 01/23/2012  . Atrial fibrillation ([place]) 11/27/2011  . Coronary artery disease 11/27/2011    Lyndel Safe Livy Ross PT, DPT, GCS  Javarri Segal 10/24/2020, 1:44 PM  [facility] [address] [place], [place], [address] Phone: [phone]   Fax:  [phone]  Name: Charlotte Glover MRN: [id number] Date of Birth: 1927-09-12

## 2020-10-27 NOTE — Patient Instructions (Incomplete)
Physical Therapy Progress Note   Dates of reporting period  09/09/20   to   10/28/20  TREATMENT   Ther-ex NuStep L1-4 x80minutes for warm-up during history, therapist adjusting resistance throughout to challenge patient based on difficulty, pt denies R hip and L knee pain; Updated outcome measures with patient: BERG: DGI: 5TSTS: FOTO:  Strength R/L 3/3 Hip flexion 3+/3+ Hip external rotation 3+/3+ Hip internal rotation 4/4 Hip abduction in sitting 4/4 Hip adduction in sitting 4-/4 Knee extension 3+/4 Knee flexion 4+/4+Ankle Plantarflexion 4+/4+  Ankle Dorsiflexion  Side stepping with 4# ankle weights x 6 lengths;  Seated LAQ with 4# ankle weights x 1 minute;  Standing exercises with 4# ankle weights (AW): Hip flexion marches x 1 minute; Hip abduction x 1 minute; HS curls x 1 minute;   Sit to stand without UE support from regular height chair with Airex pad on seat x 10, x 6, second sit discontinued secondary to pain;  6" alternating step-ups without UE support x 5 on each side, unable to perform additional due to increase in R knee pain;  Double black tband resisted forward and backward gait x 10 each;   Neuromuscular Re-education All exercises performed without UE support unless otherwise specified;  Airex tandem balance alternating forward LE 2 x 30s each; Airex balance beam side stepping x 6 lengths; Airex balance beam tandem gait x 6 lengths;  Intermittent rest breaks;   Pt educated throughout session about proper posture and technique with exercises. Improved exercise technique, movement at target joints, use of target muscles after min to mod verbal, visual, tactile cues.   Patient demonstrates excellent motivation during session today.Session focused on both balanceand strengthening but more time devoted to strengthening today. She reports some mild hip pain during second set of sit to stands so discontinued.She is able to progress  strengthening exercises during session and performed balance exercises on unstable Airex pad. Overall she making excellent progress towards her goals to improve balance and decrease fall risk. Will update outcome measures, goals, and progress note at next visit. No HEP modifications made on this date. Patient encouraged tocontinue currentHEP and follow-up as scheduled.Shewill benefit from PT services to address deficits in strength, balance, and mobility in order to return to full function at home.

## 2020-10-28 ENCOUNTER — Ambulatory Visit: Payer: Medicare Other

## 2020-10-28 ENCOUNTER — Other Ambulatory Visit: Payer: Self-pay

## 2020-10-28 DIAGNOSIS — M6281 Muscle weakness (generalized): Secondary | ICD-10-CM

## 2020-10-28 DIAGNOSIS — R262 Difficulty in walking, not elsewhere classified: Secondary | ICD-10-CM

## 2020-10-28 NOTE — Therapy (Signed)
[place] [facility] [address]. [place], [place], [address] Phone: [phone]   Fax:  [phone]  Physical Therapy Progress Note   Dates of reporting period 09/09/20   to   10/28/20  Patient Details  Name: Charlotte Glover MRN: [id number] Date of Birth: April 06, 1928 No data recorded  Encounter Date: 10/28/2020   PT End of Session - 10/28/20 0945    Visit Number 10    Number of Visits 16    Date for PT Re-Evaluation 11/04/20    Authorization Type medicare    PT Start Time 0930    PT Stop Time 1015    PT Time Calculation (min) 45 min    Equipment Utilized During Treatment Gait belt    Activity Tolerance Patient tolerated treatment well    Behavior During Therapy WFL for tasks assessed/performed           Past Medical History:  Diagnosis Date  . Arthritis   . Atrial fibrillation ([place])   . 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 Cancer ([place])   . Cardiac arrest ([place])   . CHF (congestive heart failure) ([place])   . Chickenpox   . Coronary artery disease   . Dyspnea   . Dysrhythmia   . Edema   . GERD (gastroesophageal reflux disease)   . Hearing aid worn    bilateral  . History of MI (myocardial infarction)   . HOH (hard of hearing)   . Hypertension 01/23/2012  . Myocardial infarction ([place]) 01/01/2013  . S/P ablation of atrial fibrillation   . Shingles     Past Surgical History:  Procedure Laterality Date  . ANTERIOR VITRECTOMY Left 03/20/2017   Procedure: ANTERIOR VITRECTOMY;  Surgeon: Leandrew Koyanagi, MD;  Location: [place];  Service: Ophthalmology;  Laterality: Left;  IVA TOPICAL LEFT  . ARTERY BIOPSY Right 04/08/2019   Procedure: BIOPSY TEMPORAL ARTERY;  Surgeon: Algernon Huxley, MD;  Location: [facility];  Service: Vascular;  Laterality: Right;  . ATRIAL ABLATION SURGERY    . BREAST BIOPSY Right 2015   + for DCIS   . BREAST SURGERY    . CARDIAC CATHETERIZATION    . CARDIAC CATHETERIZATION Bilateral  05/08/2016   Procedure: Right/Left Heart Cath and Coronary Angiography;  Surgeon: Isaias Cowman, MD;  Location: [place] [facility];  Service: Cardiovascular;  Laterality: Bilateral;  . CATARACT EXTRACTION W/PHACO Left 03/20/2017   Procedure: IOL EXCHANGE;  Surgeon: Leandrew Koyanagi, MD;  Location: [place];  Service: Ophthalmology;  Laterality: Left;  . CHOLECYSTECTOMY    . DILATION AND CURETTAGE OF UTERUS    . ESOPHAGOGASTRODUODENOSCOPY    . PACEMAKER INSERTION    . PERIPHERAL IRIDOTOMY Left 03/20/2017   Procedure: PERIPHERAL IRIDECTOMY;  Surgeon: Leandrew Koyanagi, MD;  Location: [place];  Service: Ophthalmology;  Laterality: Left;  . ROTATOR CUFF REPAIR Left   . TEE WITHOUT CARDIOVERSION N/A 05/16/2016   Procedure: TRANSESOPHAGEAL ECHOCARDIOGRAM (TEE);  Surgeon: Teodoro Spray, MD;  Location: [facility];  Service: Cardiovascular;  Laterality: N/A;  . TONSILLECTOMY    . uterine polyp      There were no vitals filed for this visit.   Subjective Assessment - 10/28/20 0943    Subjective Pt reports that she is doing well today. She denies any falls or stumbles since last therapy session. She denies any resting pain upon arrival. Overall reports good progress with therapy and has noticed improvement in her leg strength especially when  standing up from chairs. No specific questions or concerns.    Pertinent History Pts main complaint is her balance. Has a fear of falling but has not actually fallen. Stated she will tend to lean or start falling backwards, and reports some staggering in her gait path as well. Also reported she would like to work on her leg strength to make it easier to transfer like off/on the commode, out of chairs. Patient has been in therapy previously for vertigo as well as general conditioning. Has had issue with her balance for less than 5 years. Feels her balance has worsened in the last few weeks. Pt does have chronic R knee pain and R  shoulder pain. Pt also reported chronic DOE, that she feels has worsened lately as well. No AD use in her home, but will use her [facility] or rollator for community ambulation. Pt also mentioned some neck pain and potential radicular symptoms to RUE as well as chronic R rotator cuff tear.   PMH of afib, temporal arteritis, pacemaker (sees a cardiologist regularly), HTN, meningioma, CKDIII, NSTEMI, chronic headaches, breast cancer, TIA about 4 years ago.    How long can you sit comfortably? NA    How long can you stand comfortably? patient reported her R knee limits her ability to stand for long periods    How long can you walk comfortably? Pt feels winded by shorter distances now, for example from parking lot to store. Has to stop and catch her breath    Patient Stated Goals improve balance,    Currently in Pain? No/denies              [facility] PT Assessment - 10/28/20 0001      Berg Balance Test   Sit to Stand Able to stand  independently using hands    Standing Unsupported Able to stand safely 2 minutes    Sitting with Back Unsupported but Feet Supported on Floor or Stool Able to sit safely and securely 2 minutes    Stand to Sit Sits safely with minimal use of hands    Transfers Able to transfer safely, definite need of hands    Standing Unsupported with Eyes Closed Able to stand 10 seconds safely    Standing Unsupported with Feet Together Able to place feet together independently and stand 1 minute safely    From Standing, Reach Forward with Outstretched Arm Can reach forward >12 cm safely (5")    From Standing Position, Pick up Object from Floor Able to pick up shoe safely and easily    From Standing Position, Turn to Look Behind Over each Shoulder Looks behind one side only/other side shows less weight shift    Turn 360 Degrees Able to turn 360 degrees safely in 4 seconds or less    Standing Unsupported, Alternately Place Feet on Step/Stool Able to complete 4 steps without aid or supervision     Standing Unsupported, One Foot in Front Able to plae foot ahead of the other independently and hold 30 seconds    Standing on One Leg Tries to lift leg/unable to hold 3 seconds but remains standing independently    Total Score 46      Dynamic Gait Index   Level Surface Mild Impairment    Change in Gait Speed Moderate Impairment    Gait with Horizontal Head Turns Mild Impairment    Gait with Vertical Head Turns Mild Impairment    Gait and Pivot Turn Normal    Step Over  Obstacle Normal    Step Around Obstacles Normal    Steps Mild Impairment    Total Score 18           TREATMENT   Ther-ex NuStep L1 x3 minutes for warm-up during history, discontinued to increase in knee pain;  Standing exercises with 4# ankle weights (AW): Hip flexion marches x 1 minute; Hip abduction x 1 minute; HS curls x 1 minute;  Hip extension x 1 minute;  Seated alternating LAQ with 4# ankle weights x 1 minute;  Intermittent rest breaks;   Neuromuscular Re-education  Updated outcome measures with patient: BERG: 46/56 (improved from 42) DGI: 18/24  5TSTS: 19.5s FOTO: 53  BLE MMT: R/L 3+/3+ Hip flexion 4/4 Hip external rotation 4/4 Hip internal rotation 4/4 Hip abduction in sitting 4/4 Hip adduction in sitting 4+/4+ Knee extension 4/4 Knee flexion Active bilateral ankle Plantarflexion 4/4 Ankle Dorsiflexion   Pt educated throughout session about proper posture and technique with exercises. Improved exercise technique, movement at target joints, use of target muscles after min to mod verbal, visual, tactile cues.   Patient demonstrates excellent motivation during session today. Updated outcome measures and goals with patient. Her balance has improved since the initial evaluation as indicated by improvement in her BERG from 42/56 at initial evaluation to 46/56 today. Her DGI improved from 17/24 at initial evaluation to 18/24 today. Her leg strength has improved significantly with  her 5TSTS decreasing from 35s at initial evaluation to 19.5s today however she continues to require BUE support on the chair to complete. Manual muscle testing of BLE has also improved. Subjectively she reports imrpovement in her leg strength and balance and her FOTO improved from 43 at initial evaluation to 53 today. Patient's condition has the potential to improve in response to therapy. Maximum improvement is yet to be obtained. The anticipated improvement is attainable and reasonable in a generally predictable time.  Patient reports  No HEP modifications made on this date. Patient encouraged tocontinue currentHEP and follow-up as scheduled.Shewill benefit from PT services to address deficits in strength, balance, and mobility in order to return to full function at home.                          PT Short Term Goals - 10/28/20 1146      PT SHORT TERM GOAL #1   Title Pt will be independent with initial HEP in order to improve strength and balance in order to decrease fall risk and improve function at home    Baseline to be administered next session    Time 4    Period Weeks    Status On-going    Target Date 10/07/20             PT Long Term Goals - 10/28/20 1146      PT LONG TERM GOAL #1   Title Pt will be independent with finalized HEP in order to improve strength and balance in order to decrease fall risk and improve function at home    Time 8    Period Weeks    Status On-going    Target Date 11/04/20      PT LONG TERM GOAL #2   Title Pt will improve BERG to at least 50/56 in order to demonstrate clinically significant improvement in balance.    Baseline 42/56 on eval 3/18; 10/28/20: 47/56    Time 8    Period Weeks    Status Revised  Target Date 11/04/20      PT LONG TERM GOAL #3   Title Pt will decrease 5TSTS by at least 3 seconds in order to demonstrate clinically significant improvement in LE strength.    Baseline 35seconds with BUE support on  eval 3/18; 10/28/20: 19.5s with BUE support    Time 8    Period Weeks    Status Partially Met    Target Date 11/04/20      PT LONG TERM GOAL #4   Title Pt will improve DGI by at least 3 points in order to demonstrate clinically significant improvement in balance and decreased risk for falls.    Baseline 09/12/20: 17/24, 10/28/20: 18/24    Time 8    Period Weeks    Status Partially Met    Target Date 11/04/20      PT LONG TERM GOAL #5   Title Patient will demonstrate at 12 point improvement in her FOTO score indicating improved function and safety with functional activities.    Baseline 3/18 eval 43; 10/28/20: 53    Time 8    Period Weeks    Status Partially Met    Target Date 11/04/20      PT LONG TERM GOAL #6   Title The patient will demonstrate at least 1/2 grade improvement in LLE MMT indicating improved strength and ability to perform functional activities such as transfers, ambulation, stairs, curbs, etc    Baseline on eval 3/18 grossly 3+/5 for hip strength; 10/28/20: Partially met, see visit note    Time 8    Period Weeks    Status Partially Met    Target Date 11/04/20                 Plan - 10/28/20 0945    Clinical Impression Statement Patient demonstrates excellent motivation during session today. Updated outcome measures and goals with patient. Her balance has improved since the initial evaluation as indicated by improvement in her BERG from 42/56 at initial evaluation to 46/56 today. Her DGI improved from 17/24 at initial evaluation to 18/24 today. Her leg strength has improved significantly with her 5TSTS decreasing from 35s at initial evaluation to 19.5s today however she continues to require BUE support on the chair to complete. Manual muscle testing of BLE has also improved. Subjectively she reports imrpovement in her leg strength and balance and her FOTO improved from 43 at initial evaluation to 53 today. Patient's condition has the potential to improve in response to  therapy. Maximum improvement is yet to be obtained. The anticipated improvement is attainable and reasonable in a generally predictable time.  Patient reports  No HEP modifications made on this date. Patient encouraged to continue current HEP and follow-up as scheduled.  She will benefit from PT services to address deficits in strength, balance, and mobility in order to return to full function at home.    Personal Factors and Comorbidities Age;Comorbidity 3+;Fitness    Comorbidities afib, temporal arteritis, pacemaker (sees a cardiologist regularly), HTN, meningioma, CKDIII, NSTEMI, chronic headaches, breast cancer, TIA about 4 years ago.    Examination-Activity Limitations Transfers;Bed Mobility;Locomotion Level;Stairs;Stand;Reach Overhead;Squat;Lift;Bend    Examination-Participation Restrictions Church;Laundry;Cleaning;Shop;Community Activity;Volunteer;Meal Prep;Interpersonal Relationship    Stability/Clinical Decision Making Stable/Uncomplicated    Rehab Potential Good    PT Frequency 2x / week    PT Duration 8 weeks    PT Treatment/Interventions Canalith Repostioning;ADLs/Self Care Home Management;Gait training;Stair training;Therapeutic activities;Therapeutic exercise;Balance training;Neuromuscular re-education;Patient/family education;Vestibular;Cryotherapy;Moist Heat;Electrical Stimulation;Splinting;Taping;Joint Manipulations;Spinal Manipulations;Dry needling;Energy conservation;Passive range of motion;Aquatic  Therapy    PT Next Visit Plan balance and strength interventions    PT Home Exercise Plan Access Code: [email or web address]    Consulted and Agree with Plan of Care Patient           Patient will benefit from skilled therapeutic intervention in order to improve the following deficits and impairments:  Abnormal gait,Decreased balance,Decreased endurance,Difficulty walking,Cardiopulmonary status limiting activity,Decreased activity tolerance,Decreased strength  Visit Diagnosis: Difficulty in  walking, not elsewhere classified  Muscle weakness (generalized)     Problem List Patient Active Problem List   Diagnosis Date Noted  . Intracranial carotid stenosis, right 05/29/2019  . Chronic headaches 05/29/2019  . Long term current use of systemic steroids 05/04/2019  . Screening for osteoporosis 05/04/2019  . Temporal arteritis ([place]) 04/07/2019  . Meningioma ([place]) 12/29/2018  . Chronic daily headache 11/20/2018  . Elevated erythrocyte sedimentation rate 11/05/2018  . CKD (chronic kidney disease) stage 3, GFR 30-59 ml/min ([facility]) 12/27/2017  . Shingles 05/06/2017  . 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 Primary osteoarthritis of right shoulder 12/07/2016  . Rotator cuff tear, right 12/07/2016  . Rotator cuff tendinitis, right 12/07/2016  . CHB (complete heart block) ([place]) 06/30/2016  . Severe mitral regurgitation 05/29/2016  . Pedal edema 04/23/2016  . Lung nodule, multiple 12/28/2015  . Amnesia, global, transient 04/05/2015  . 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 Cardiac pacemaker 05/18/2014  . Sleep disorder 05/06/2014  . Diffuse pain 04/06/2014  . Fatigue 03/16/2014  . Headache 03/16/2014  . History of melanoma 11/24/2013  . Melanoma ([place]) 11/24/2013  . DCIS (ductal carcinoma in situ) 08/11/2013  . History of ductal carcinoma in situ (DCIS) of breast 08/11/2013  . H/O non-ST elevation myocardial infarction (NSTEMI) 01/01/2013  . Hearing impairment 01/01/2013  . S/P ablation of atrial fibrillation 07/11/2012  . Edema 01/23/2012  . Hypertension 01/23/2012  . Atrial fibrillation ([place]) 11/27/2011  . Coronary artery disease 11/27/2011   Lyndel Safe Huprich PT, DPT, GCS  Huprich,Jason 10/28/2020, 11:50 AM  [place] [facility] [address] [place], [place], [address] Phone: [phone]   Fax:  [phone]  Name: Charlotte Glover MRN:  [id number] Date of Birth: 1928/05/30

## 2020-10-31 ENCOUNTER — Ambulatory Visit: Payer: Medicare Other

## 2020-11-01 ENCOUNTER — Encounter (INDEPENDENT_AMBULATORY_CARE_PROVIDER_SITE_OTHER): Payer: Self-pay | Admitting: Vascular Surgery

## 2020-11-01 ENCOUNTER — Ambulatory Visit (INDEPENDENT_AMBULATORY_CARE_PROVIDER_SITE_OTHER): Payer: Medicare Other | Admitting: Vascular Surgery

## 2020-11-01 ENCOUNTER — Other Ambulatory Visit: Payer: Self-pay

## 2020-11-01 VITALS — BP 133/74 | HR 83 | Resp 16 | Ht 69.5 in | Wt 178.6 lb

## 2020-11-01 DIAGNOSIS — I2510 Atherosclerotic heart disease of native coronary artery without angina pectoris: Secondary | ICD-10-CM | POA: Diagnosis not present

## 2020-11-01 DIAGNOSIS — I6521 Occlusion and stenosis of right carotid artery: Secondary | ICD-10-CM | POA: Diagnosis not present

## 2020-11-01 DIAGNOSIS — I4891 Unspecified atrial fibrillation: Secondary | ICD-10-CM | POA: Diagnosis not present

## 2020-11-01 DIAGNOSIS — M7989 Other specified soft tissue disorders: Secondary | ICD-10-CM

## 2020-11-01 NOTE — Progress Notes (Signed)
Patient ID: Charlotte Glover, female   DOB: [birth year], 87 y.o.   MRN: [id number]  No chief complaint on file.   HPI Charlotte Glover is a 87 y.o. female.  I am asked to see the patient by Dr. Caryl Comes for evaluation of leg swelling.  She endorses a longstanding history of intermittent leg swelling.  She reports no previous history of DVT or superficial thrombophlebitis to her knowledge.  No history of ulceration or infection.  The left leg swells a little more than the right but they both swell at times.  Compression stockings generally keep this under good control and she wears these regularly.  She also tries to elevate her legs.  She has some discoloration particular in the left lower leg consistent with venous stasis changes.  She has previously been seen in our office for intracranial arterial disease that has been managed medically.   Past Medical History:  Diagnosis Date  . Arthritis   . Atrial fibrillation ([place])   . Breast cancer ([place]) 2015   DCIS  . Cancer ([place])   . Cardiac arrest ([place])   . CHF (congestive heart failure) ([place])   . Chickenpox   . Coronary artery disease   . Dyspnea   . Dysrhythmia   . Edema   . GERD (gastroesophageal reflux disease)   . Hearing aid worn    bilateral  . History of MI (myocardial infarction)   . HOH (hard of hearing)   . Hypertension 01/23/2012  . Myocardial infarction ([place]) 01/01/2013  . S/P ablation of atrial fibrillation   . Shingles     Past Surgical History:  Procedure Laterality Date  . ANTERIOR VITRECTOMY Left 03/20/2017   Procedure: ANTERIOR VITRECTOMY;  Surgeon: Leandrew Koyanagi, MD;  Location: [facility];  Service: Ophthalmology;  Laterality: Left;  IVA TOPICAL LEFT  . ARTERY BIOPSY Right 04/08/2019   Procedure: BIOPSY TEMPORAL ARTERY;  Surgeon: Algernon Huxley, MD;  Location: [facility];  Service: Vascular;  Laterality: Right;  . ATRIAL ABLATION SURGERY    . BREAST BIOPSY Right 2015   + for DCIS   .  BREAST SURGERY    . CARDIAC CATHETERIZATION    . CARDIAC CATHETERIZATION Bilateral 05/08/2016   Procedure: Right/Left Heart Cath and Coronary Angiography;  Surgeon: Isaias Cowman, MD;  Location: [facility];  Service: Cardiovascular;  Laterality: Bilateral;  . CATARACT EXTRACTION W/PHACO Left 03/20/2017   Procedure: IOL EXCHANGE;  Surgeon: Leandrew Koyanagi, MD;  Location: [facility];  Service: Ophthalmology;  Laterality: Left;  . CHOLECYSTECTOMY    . DILATION AND CURETTAGE OF UTERUS    . ESOPHAGOGASTRODUODENOSCOPY    . PACEMAKER INSERTION    . PERIPHERAL IRIDOTOMY Left 03/20/2017   Procedure: PERIPHERAL IRIDECTOMY;  Surgeon: Leandrew Koyanagi, MD;  Location: [facility];  Service: Ophthalmology;  Laterality: Left;  . ROTATOR CUFF REPAIR Left   . TEE WITHOUT CARDIOVERSION N/A 05/16/2016   Procedure: TRANSESOPHAGEAL ECHOCARDIOGRAM (TEE);  Surgeon: Teodoro Spray, MD;  Location: [facility];  Service: Cardiovascular;  Laterality: N/A;  . TONSILLECTOMY    . uterine polyp       Family History  Problem Relation Age of Onset  . Breast cancer Mother   . Cancer Sister   . Cancer Brother   . Cancer Daughter   . Breast cancer Daughter   . Cancer Son       Social History   Tobacco Use  . Smoking status: Never Smoker  . Smokeless  tobacco: Never Used  Vaping Use  . Vaping Use: Never used  Substance Use Topics  . Alcohol use: Yes    Alcohol/week: 7.0 standard drinks    Types: 7 Shots of liquor per week    Comment:  (1 scotch daily)  . Drug use: No     Allergies  Allergen Reactions  . Amiodarone Other (See Comments)    Bradycardia and sinus pauses  . Baclofen Other (See Comments)    Double vision  . Metoprolol Rash  . Pantoprazole Rash  . Tape Rash    Current Outpatient Medications  Medication Sig Dispense Refill  . Acetaminophen 500 MG coapsule Take by mouth.    Marland Kitchen azelastine (ASTELIN) 0.1 % nasal spray Place into both nostrils 2 (two)  times daily. Use in each nostril as directed    . calcium carbonate (TUMS - DOSED IN MG ELEMENTAL CALCIUM) 500 MG chewable tablet Chew 1-2 tablets by mouth daily as needed for indigestion or heartburn.    . esomeprazole (NEXIUM) 20 MG capsule Take 20 mg by mouth 2 (two) times daily before a meal.    . furosemide (LASIX) 40 MG tablet Take 40 mg by mouth 2 (two) times daily.   3  . gabapentin (NEURONTIN) 300 MG capsule Take 1,200-1,500 mg by mouth 2 (two) times daily. Take 1500mg  in the morning and 1200mg  at night    . gabapentin (NEURONTIN) 300 MG capsule Take by mouth.    . losartan (COZAAR) 25 MG tablet Take 25 mg by mouth daily.    . metolazone (ZAROXOLYN) 5 MG tablet Take 5 mg by mouth daily as needed.    . Multiple Vitamin (MULTIVITAMIN) tablet Take 1 tablet by mouth daily.    . Multiple Vitamins-Minerals (PRESERVISION AREDS 2) CAPS Take 1 capsule by mouth 2 (two) times daily.    . naproxen sodium (ANAPROX) 220 MG tablet Take 220 mg by mouth as needed.    . nitroGLYCERIN (NITROSTAT) 0.4 MG SL tablet Place 0.4 mg under the tongue every 5 (five) minutes x 2 doses as needed for chest pain.     . nortriptyline (PAMELOR) 10 MG capsule Take 10 mg by mouth every evening.  3  . nortriptyline (PAMELOR) 50 MG capsule Take by mouth.    Marland Kitchen omeprazole (PRILOSEC) 20 MG capsule Take 20 mg by mouth 2 (two) times daily before a meal.    . omeprazole (PRILOSEC) 20 MG capsule TAKE 1 CAPSULE(20 MG) BY MOUTH TWICE DAILY    . Polyvinyl Alcohol-Povidone (REFRESH OP) Apply 1 drop to eye daily as needed (dry eyes).    . predniSONE (DELTASONE) 20 MG tablet Take 5 mg by mouth daily with breakfast.     . rivaroxaban (XARELTO) 20 MG TABS tablet Take 20 mg by mouth daily after breakfast.    . senna (SENOKOT) 8.6 MG TABS tablet Take 1 tablet by mouth daily as needed for mild constipation.    . triamcinolone ointment (KENALOG) 0.1 % Apply 1 application topically 2 (two) times daily.    Marland Kitchen warfarin (COUMADIN) 5 MG tablet Take  5 mg by mouth daily.    . potassium chloride SA (K-DUR) 20 MEQ tablet Take 1 tablet (20 mEq total) by mouth 2 (two) times daily for 5 days. 10 tablet 0   No current facility-administered medications for this visit.     REVIEW OF SYSTEMS(Negative unless checked)  Constitutional: [] ??Weight loss[] ??Fever[] ??Chills Cardiac:[] ??Chest pain[] ??Chest pressure[] ??Palpitations [] ??Shortness of breath when laying flat [] ??Shortness of breath at rest [] ??Shortness of  breath with exertion. Vascular: [] ??Pain in legs with walking[] ??Pain in legsat rest[] ??Pain in legs when laying flat [] ??Claudication [] ??Pain in feet when walking [] ??Pain in feet at rest [] ??Pain in feet when laying flat [] ??History of DVT [] ??Phlebitis [x] ??Swelling in legs [] ??Varicose veins [] ??Non-healing ulcers Pulmonary: [] ??Uses home oxygen [] ??Productive cough[] ??Hemoptysis [] ??Wheeze [] ??COPD [] ??Asthma Neurologic: [] ??Dizziness [] ??Blackouts [] ??Seizures [] ??History of stroke [] ??History of TIA[] ??Aphasia [] ??Temporary blindness[] ??Dysphagia [] ??Weaknessor numbness in arms [] ??Weakness or numbnessin legsX positive for headaches Musculoskeletal: [x] ??Arthritis [] ??Joint swelling [x] ??Joint pain [] ??Low back pain Hematologic:[] ??Easy bruising[] ??Easy bleeding [] ??Hypercoagulable state [] ??Anemic [] ??Hepatitis Gastrointestinal:[] ??Blood in stool[] ??Vomiting blood[] ??Gastroesophageal reflux/heartburn[] ??Abdominal pain Genitourinary: [] ??Chronic kidney disease [] ??Difficulturination [] ??Frequenturination [] ??Burning with urination[] ??Hematuria Skin: [] ??Rashes [] ??Ulcers [] ??Wounds Psychological: [] ??History of anxiety[] ??History of major depression.    Physical Exam BP 133/74 (BP Location: Right Arm)   Pulse 83   Resp 16   Ht 5' 9.5" (1.765 m)   Wt 178 lb 9.6 oz (81 kg)   BMI 26.00 kg/m  Gen:  WD/WN,  NAD. Appears younger than stated age. Head: [place]/AT, No temporalis wasting.  Ear/Nose/Throat: Hearing grossly intact, nares w/o erythema or drainage, oropharynx w/o Erythema/Exudate Eyes: Conjunctiva clear, sclera non-icteric  Neck: trachea midline.  No JVD.  Pulmonary:  Good air movement, respirations not labored, no use of accessory muscles  Cardiac: Irregular Vascular:  Vessel Right Left  Radial Palpable Palpable                          PT 1+ 1+  DP 2+ 1+   Gastrointestinal:. No masses, surgical incisions, or scars. Musculoskeletal: M/S 5/5 throughout.  Extremities without ischemic changes.  No deformity or atrophy.  Mild bilateral lower extremity edema. Neurologic: Sensation grossly intact in extremities.  Symmetrical.  Speech is fluent. Motor exam as listed above. Psychiatric: Judgment intact, Mood & affect appropriate for pt's clinical situation. Dermatologic: No rashes or ulcers noted.  No cellulitis or open wounds.    Radiology No results found.  Labs No results found for this or any previous visit (from the past 2160 hour(s)).  Assessment/Plan: Coronary artery disease Continue cardiac and antihypertensive medications as already ordered and reviewed, no changes at this time. Continue statin as ordered and reviewed, no changes at this time Nitrates PRN for chest pain   Atrial fibrillation Rate controlled. On anticoagulation.   Intracranial carotid stenosis, right Cervical carotid and vertebral arteries were fairly normal on a CT scan from 2020. No interventions for the intracranial disease.  Swelling of limb I have had a long discussion with the patient regarding swelling and why it  causes symptoms.  Patient will continue wearing graduated compression stockings class 1 (20-30 mmHg) on a daily basis. The patient will  beginning wearing the stockings first thing in the morning and removing them in the evening. The patient is instructed specifically not to sleep  in the stockings.   In addition, behavioral modification will be initiated.  This will include frequent elevation, use of over the counter pain medications and exercise such as walking.  I have reviewed systemic causes for chronic edema such as liver, kidney and cardiac etiologies.  The patient denies problems with these organ systems.    Consideration for a lymph pump will also be made based upon the effectiveness of conservative therapy.  This would help to improve the edema control and prevent sequela such as ulcers and infections   Patient should undergo duplex ultrasound of the venous system to ensure that DVT or reflux is not present.  The patient will  follow-up with me after the ultrasound.        Leotis Pain 11/01/2020, 12:56 PM   This note was created with Dragon medical transcription system.  Any errors from dictation are unintentional.

## 2020-11-01 NOTE — Assessment & Plan Note (Signed)
I have had a long discussion with the patient regarding swelling and why it  causes symptoms.  Patient will continue wearing graduated compression stockings class 1 (20-30 mmHg) on a daily basis. The patient will  beginning wearing the stockings first thing in the morning and removing them in the evening. The patient is instructed specifically not to sleep in the stockings.   In addition, behavioral modification will be initiated.  This will include frequent elevation, use of over the counter pain medications and exercise such as walking.  I have reviewed systemic causes for chronic edema such as liver, kidney and cardiac etiologies.  The patient denies problems with these organ systems.    Consideration for a lymph pump will also be made based upon the effectiveness of conservative therapy.  This would help to improve the edema control and prevent sequela such as ulcers and infections   Patient should undergo duplex ultrasound of the venous system to ensure that DVT or reflux is not present.  The patient will follow-up with me after the ultrasound.   

## 2020-11-01 NOTE — Patient Instructions (Signed)
l °

## 2020-11-04 ENCOUNTER — Ambulatory Visit: Payer: Medicare Other

## 2020-11-04 ENCOUNTER — Other Ambulatory Visit: Payer: Self-pay

## 2020-11-04 DIAGNOSIS — M6281 Muscle weakness (generalized): Secondary | ICD-10-CM

## 2020-11-04 DIAGNOSIS — R262 Difficulty in walking, not elsewhere classified: Secondary | ICD-10-CM | POA: Diagnosis not present

## 2020-11-04 NOTE — Therapy (Signed)
[place] [facility] [address]. [place], [place], [address] Phone: [phone]   Fax:  [phone]  Physical Therapy Treatment/Recertification   Patient Details  Name: Charlotte Glover MRN: [id number] Date of Birth: 10/01/1927 No data recorded  Encounter Date: 11/04/2020   PT End of Session - 11/04/20 0945    Visit Number 11    Number of Visits 33    Date for PT Re-Evaluation 12/16/20    Authorization Type medicare    PT Start Time 0935    PT Stop Time 1015    PT Time Calculation (min) 40 min    Equipment Utilized During Treatment Gait belt    Activity Tolerance Patient tolerated treatment well    Behavior During Therapy WFL for tasks assessed/performed           Past Medical History:  Diagnosis Date  . Arthritis   . Atrial fibrillation ([place])   . Breast cancer ([place]) 2015   DCIS  . Cancer ([place])   . Cardiac arrest ([place])   . CHF (congestive heart failure) ([place])   . Chickenpox   . Coronary artery disease   . Dyspnea   . Dysrhythmia   . Edema   . GERD (gastroesophageal reflux disease)   . Hearing aid worn    bilateral  . History of MI (myocardial infarction)   . HOH (hard of hearing)   . Hypertension 01/23/2012  . Myocardial infarction ([place]) 01/01/2013  . S/P ablation of atrial fibrillation   . Shingles     Past Surgical History:  Procedure Laterality Date  . ANTERIOR VITRECTOMY Left 03/20/2017   Procedure: ANTERIOR VITRECTOMY;  Surgeon: Leandrew Koyanagi, MD;  Location: [place];  Service: Ophthalmology;  Laterality: Left;  IVA TOPICAL LEFT  . ARTERY BIOPSY Right 04/08/2019   Procedure: BIOPSY TEMPORAL ARTERY;  Surgeon: Algernon Huxley, MD;  Location: [facility];  Service: Vascular;  Laterality: Right;  . ATRIAL ABLATION SURGERY    . BREAST BIOPSY Right 2015   + for DCIS   . BREAST SURGERY    . CARDIAC CATHETERIZATION    . CARDIAC CATHETERIZATION Bilateral 05/08/2016   Procedure: Right/Left Heart Cath  and Coronary Angiography;  Surgeon: Isaias Cowman, MD;  Location: [place] [facility];  Service: Cardiovascular;  Laterality: Bilateral;  . CATARACT EXTRACTION W/PHACO Left 03/20/2017   Procedure: IOL EXCHANGE;  Surgeon: Leandrew Koyanagi, MD;  Location: [place];  Service: Ophthalmology;  Laterality: Left;  . CHOLECYSTECTOMY    . DILATION AND CURETTAGE OF UTERUS    . ESOPHAGOGASTRODUODENOSCOPY    . PACEMAKER INSERTION    . PERIPHERAL IRIDOTOMY Left 03/20/2017   Procedure: PERIPHERAL IRIDECTOMY;  Surgeon: Leandrew Koyanagi, MD;  Location: [place];  Service: Ophthalmology;  Laterality: Left;  . ROTATOR CUFF REPAIR Left   . TEE WITHOUT CARDIOVERSION N/A 05/16/2016   Procedure: TRANSESOPHAGEAL ECHOCARDIOGRAM (TEE);  Surgeon: Teodoro Spray, MD;  Location: [facility];  Service: Cardiovascular;  Laterality: N/A;  . TONSILLECTOMY    . uterine polyp      There were no vitals filed for this visit.   Subjective Assessment - 11/04/20 0937    Subjective Pt reports that she is doing alright today but feeling a little tired from note sleeping particularly well last night. She denies any falls or stumbles since last therapy session but states that she felt like her ankle was going buckle this morning. She denies any resting pain upon arrival. Overall reports good progress  with therapy. No specific questions or concerns.    Pertinent History Pts main complaint is her balance. Has a fear of falling but has not actually fallen. Stated she will tend to lean or start falling backwards, and reports some staggering in her gait path as well. Also reported she would like to work on her leg strength to make it easier to transfer like off/on the commode, out of chairs. Patient has been in therapy previously for vertigo as well as general conditioning. Has had issue with her balance for less than 5 years. Feels her balance has worsened in the last few weeks. Pt does have chronic R knee pain  and R shoulder pain. Pt also reported chronic DOE, that she feels has worsened lately as well. No AD use in her home, but will use her [facility] or rollator for community ambulation. Pt also mentioned some neck pain and potential radicular symptoms to RUE as well as chronic R rotator cuff tear.   PMH of afib, temporal arteritis, pacemaker (sees a cardiologist regularly), HTN, meningioma, CKDIII, NSTEMI, chronic headaches, breast cancer, TIA about 4 years ago.    How long can you sit comfortably? NA    How long can you stand comfortably? patient reported her R knee limits her ability to stand for long periods    How long can you walk comfortably? Pt feels winded by shorter distances now, for example from parking lot to store. Has to stop and catch her breath    Patient Stated Goals improve balance,    Currently in Pain? No/denies               TREATMENT   Ther-ex Standing exercises with 4# ankle weights (AW): Hip flexion marches x 1 minute; Hip abduction x 1 minute; HS curls x 1 minute;  Hip extension x 1 minute;  Seated alternating LAQ with 4# ankle weights x 1 minute; Side stepping in // bars with 4# ankle weights x 1 minute; Sit to stand from regular height chair with Airex pad on seat x 5, x 4;  Intermittent rest breaks;   Neuromuscular Re-education  All balance exercises performed in // bars without UE support; Tandem balance alternating forward LE x 30s each; Tandem gait in // bars without UE support x 6 lengths; Airex wide base of support balance with dynamic reaching for cones with therapist adjusting location;    Pt educated throughout session about proper posture and technique with exercises. Improved exercise technique, movement at target joints, use of target muscles after min to mod verbal, visual, tactile cues.   Patient demonstrates excellent motivation during session today. Updated outcome measures and goals with patient during last session. Her balance has  improved since the initial evaluation as indicated by improvement in her BERG from 42/56 at initial evaluation to 46/56. Her DGI improved from 17/24 at initial evaluation to 18/24 and her leg strength has improved significantly with her 5TSTS decreasing from 35s at initial evaluation to 19.5s. However she continues to require BUE support on the chair to complete a full sit to stand from a regular height chair. Manual muscle testing of BLE has also improved. Subjectively she reports imrpovement in her leg strength and balance and her FOTO improved from 43 at initial evaluation to 53. Patient's condition has the potential to improve in response to therapy. Maximum improvement is yet to be obtained. The anticipated improvement is attainable and reasonable in a generally predictable time. HEP progressed today. Patient encouraged tofollow-up as scheduled.Shewill  benefit from PT services to address deficits in strength, balance, and mobility in order to return to full function at home.                            PT Education - 11/04/20 1106    Education Details HEP progression    Person(s) Educated Patient    Methods Explanation    Comprehension Verbalized understanding            PT Short Term Goals - 11/04/20 1109      PT SHORT TERM GOAL #1   Title Pt will be independent with initial HEP in order to improve strength and balance in order to decrease fall risk and improve function at home    Baseline to be administered next session    Time 4    Period Weeks    Status On-going    Target Date 12/02/20             PT Long Term Goals - 11/04/20 1109      PT LONG TERM GOAL #1   Title Pt will be independent with finalized HEP in order to improve strength and balance in order to decrease fall risk and improve function at home    Time 8    Period Weeks    Status On-going    Target Date 12/30/20      PT LONG TERM GOAL #2   Title Pt will improve BERG to at least  50/56 in order to demonstrate clinically significant improvement in balance.    Baseline 42/56 on eval 3/18; 10/28/20: 47/56    Time 8    Period Weeks    Status Partially Met    Target Date 12/30/20      PT LONG TERM GOAL #3   Title Pt will decrease 5TSTS by at least 3 seconds in order to demonstrate clinically significant improvement in LE strength.    Baseline 35seconds with BUE support on eval 3/18; 10/28/20: 19.5s with BUE support    Time 8    Period Weeks    Status Partially Met    Target Date 12/30/20      PT LONG TERM GOAL #4   Title Pt will improve DGI by at least 3 points in order to demonstrate clinically significant improvement in balance and decreased risk for falls.    Baseline 09/12/20: 17/24, 10/28/20: 18/24    Time 8    Period Weeks    Status Partially Met    Target Date 12/30/20      PT LONG TERM GOAL #5   Title Patient will demonstrate at 12 point improvement in her FOTO score indicating improved function and safety with functional activities.    Baseline 3/18 eval 43; 10/28/20: 53    Time 8    Period Weeks    Status Partially Met    Target Date 12/30/20      PT LONG TERM GOAL #6   Title The patient will demonstrate at least 1/2 grade improvement in LLE MMT indicating improved strength and ability to perform functional activities such as transfers, ambulation, stairs, curbs, etc    Baseline on eval 3/18 grossly 3+/5 for hip strength; 10/28/20: Partially met, see visit note    Time 8    Period Weeks    Status Partially Met    Target Date 12/30/20  Plan - 11/04/20 1106    Clinical Impression Statement Patient demonstrates excellent motivation during session today. Updated outcome measures and goals with patient during last session. Her balance has improved since the initial evaluation as indicated by improvement in her BERG from 42/56 at initial evaluation to 46/56. Her DGI improved from 17/24 at initial evaluation to 18/24 and her leg strength has  improved significantly with her 5TSTS decreasing from 35s at initial evaluation to 19.5s. However she continues to require BUE support on the chair to complete a full sit to stand from a regular height chair. Manual muscle testing of BLE has also improved. Subjectively she reports imrpovement in her leg strength and balance and her FOTO improved from 43 at initial evaluation to 53. Patient's condition has the potential to improve in response to therapy. Maximum improvement is yet to be obtained. The anticipated improvement is attainable and reasonable in a generally predictable time. HEP progressed today. Patient encouraged to follow-up as scheduled.  She will benefit from PT services to address deficits in strength, balance, and mobility in order to return to full function at home.    Personal Factors and Comorbidities Age;Comorbidity 3+;Fitness    Comorbidities afib, temporal arteritis, pacemaker (sees a cardiologist regularly), HTN, meningioma, CKDIII, NSTEMI, chronic headaches, breast cancer, TIA about 4 years ago.    Examination-Activity Limitations Transfers;Bed Mobility;Locomotion Level;Stairs;Stand;Reach Overhead;Squat;Lift;Bend    Examination-Participation Restrictions Church;Laundry;Cleaning;Shop;Community Activity;Volunteer;Meal Prep;Interpersonal Relationship    Stability/Clinical Decision Making Stable/Uncomplicated    Rehab Potential Good    PT Frequency 2x / week    PT Duration 8 weeks    PT Treatment/Interventions Canalith Repostioning;ADLs/Self Care Home Management;Gait training;Stair training;Therapeutic activities;Therapeutic exercise;Balance training;Neuromuscular re-education;Patient/family education;Vestibular;Cryotherapy;Moist Heat;Electrical Stimulation;Splinting;Taping;Joint Manipulations;Spinal Manipulations;Dry needling;Energy conservation;Passive range of motion;Aquatic Therapy    PT Next Visit Plan balance and strength interventions    PT Home Exercise Plan Access Code:  [email or web address]    Consulted and Agree with Plan of Care Patient           Patient will benefit from skilled therapeutic intervention in order to improve the following deficits and impairments:  Abnormal gait,Decreased balance,Decreased endurance,Difficulty walking,Cardiopulmonary status limiting activity,Decreased activity tolerance,Decreased strength  Visit Diagnosis: Difficulty in walking, not elsewhere classified  Muscle weakness (generalized)     Problem List Patient Active Problem List   Diagnosis Date Noted  . Swelling of limb 11/01/2020  . Intracranial carotid stenosis, right 05/29/2019  . Chronic headaches 05/29/2019  . Long term current use of systemic steroids 05/04/2019  . Screening for osteoporosis 05/04/2019  . Temporal arteritis ([place]) 04/07/2019  . Meningioma ([place]) 12/29/2018  . Chronic daily headache 11/20/2018  . Elevated erythrocyte sedimentation rate 11/05/2018  . CKD (chronic kidney disease) stage 3, GFR 30-59 ml/min (H[facility]) 12/27/2017  . Shingles 05/06/2017  . 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 Primary osteoarthritis of right shoulder 12/07/2016  . Rotator cuff tear, right 12/07/2016  . Rotator cuff tendinitis, right 12/07/2016  . CHB (complete heart block) ([place]) 06/30/2016  . Severe mitral regurgitation 05/29/2016  . Pedal edema 04/23/2016  . Lung nodule, multiple 12/28/2015  . Amnesia, global, transient 04/05/2015  . 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 Cardiac pacemaker 05/18/2014  . Sleep disorder 05/06/2014  . Diffuse pain 04/06/2014  . Fatigue 03/16/2014  . Headache 03/16/2014  . History of melanoma 11/24/2013  . Melanoma ([place]) 11/24/2013  . DCIS (ductal carcinoma in situ) 08/11/2013  . History of ductal carcinoma in situ (DCIS)  of breast 08/11/2013  . H/O non-ST elevation myocardial infarction (NSTEMI) 01/01/2013  . Hearing impairment 01/01/2013  . S/P ablation  of atrial fibrillation 07/11/2012  . Edema 01/23/2012  . Hypertension 01/23/2012  . Atrial fibrillation ([place]) 11/27/2011  . Coronary artery disease 11/27/2011   Lyndel Safe Huprich PT, DPT, GCS  Huprich,Jason 11/04/2020, 11:23 AM  [place] [facility] [address] [place], [place], [address] Phone: [phone]   Fax:  [phone]  Name: Charlotte Glover MRN: [id number] Date of Birth: 1927/07/31

## 2020-11-04 NOTE — Patient Instructions (Signed)
Access Code: [email or web address] URL: [email or web address] Date: 11/04/2020 Prepared by: Roxana Hires  Exercises Standing Romberg to 1/2 Tandem Stance - 1 x daily - 7 x weekly - 30s x 3 with each foot forward hold Standing Tandem Balance with Counter Support - 1 x daily - 7 x weekly - 30s x 3 with each foot forward hold Seated March with Resistance - 1 x daily - 7 x weekly - 2 sets - 10 reps - 3s hold Seated Hip Abduction with Resistance - 1 x daily - 7 x weekly - 2 sets - 10 reps - 3s hold Standing Hip Abduction with Counter Support - 1 x daily - 7 x weekly - 2 sets - 10 reps Mini Squat with Counter Support - 1 x daily - 7 x weekly - 2 sets - 10 reps Sit to Stand Without Arm Support - 1 x daily - 7 x weekly - 2 sets - 10 reps

## 2020-11-07 ENCOUNTER — Other Ambulatory Visit: Payer: Self-pay

## 2020-11-07 ENCOUNTER — Ambulatory Visit: Payer: Medicare Other

## 2020-11-07 DIAGNOSIS — R262 Difficulty in walking, not elsewhere classified: Secondary | ICD-10-CM

## 2020-11-07 DIAGNOSIS — M6281 Muscle weakness (generalized): Secondary | ICD-10-CM

## 2020-11-07 NOTE — Therapy (Signed)
[facility] [address]. [place], [place], [address] Phone: [phone]   Fax:  [phone]  Physical Therapy Treatment  Patient Details  Name: Charlotte Glover MRN: [id number] Date of Birth: 1928/01/02 No data recorded  Encounter Date: 11/07/2020   PT End of Session - 11/07/20 1305    Visit Number 12    Number of Visits 33    Date for PT Re-Evaluation 12/16/20    Authorization Type medicare    PT Start Time 3149    PT Stop Time 1100    PT Time Calculation (min) 45 min    Equipment Utilized During Treatment Gait belt    Activity Tolerance Patient tolerated treatment well    Behavior During Therapy WFL for tasks assessed/performed           Past Medical History:  Diagnosis Date  . Arthritis   . Atrial fibrillation ([place])   . Breast cancer ([place]) 2015   DCIS  . Cancer ([place])   . Cardiac arrest ([place])   . CHF (congestive heart failure) ([place])   . Chickenpox   . Coronary artery disease   . Dyspnea   . Dysrhythmia   . Edema   . GERD (gastroesophageal reflux disease)   . Hearing aid worn    bilateral  . History of MI (myocardial infarction)   . HOH (hard of hearing)   . Hypertension 01/23/2012  . Myocardial infarction ([place]) 01/01/2013  . S/P ablation of atrial fibrillation   . Shingles     Past Surgical History:  Procedure Laterality Date  . ANTERIOR VITRECTOMY Left 03/20/2017   Procedure: ANTERIOR VITRECTOMY;  Surgeon: Leandrew Koyanagi, MD;  Location: [place];  Service: Ophthalmology;  Laterality: Left;  IVA TOPICAL LEFT  . ARTERY BIOPSY Right 04/08/2019   Procedure: BIOPSY TEMPORAL ARTERY;  Surgeon: Algernon Huxley, MD;  Location: [facility];  Service: Vascular;  Laterality: Right;  . ATRIAL ABLATION SURGERY    . BREAST BIOPSY Right 2015   + for DCIS   . BREAST SURGERY    . CARDIAC CATHETERIZATION    . CARDIAC CATHETERIZATION Bilateral 05/08/2016   Procedure: Right/Left Heart Cath and Coronary  Angiography;  Surgeon: Isaias Cowman, MD;  Location: [facility];  Service: Cardiovascular;  Laterality: Bilateral;  . CATARACT EXTRACTION W/PHACO Left 03/20/2017   Procedure: IOL EXCHANGE;  Surgeon: Leandrew Koyanagi, MD;  Location: [place] [facility];  Service: Ophthalmology;  Laterality: Left;  . CHOLECYSTECTOMY    . DILATION AND CURETTAGE OF UTERUS    . ESOPHAGOGASTRODUODENOSCOPY    . PACEMAKER INSERTION    . PERIPHERAL IRIDOTOMY Left 03/20/2017   Procedure: PERIPHERAL IRIDECTOMY;  Surgeon: Leandrew Koyanagi, MD;  Location: [facility];  Service: Ophthalmology;  Laterality: Left;  . ROTATOR CUFF REPAIR Left   . TEE WITHOUT CARDIOVERSION N/A 05/16/2016   Procedure: TRANSESOPHAGEAL ECHOCARDIOGRAM (TEE);  Surgeon: Teodoro Spray, MD;  Location: [facility];  Service: Cardiovascular;  Laterality: N/A;  . TONSILLECTOMY    . uterine polyp      There were no vitals filed for this visit.   Subjective Assessment - 11/07/20 1033    Subjective Pt states that she is doing well today. She denies any resting pain upon arrival but states that she thinks that she might have strained her L quad exercising over the weekend. No stumbles or falls reported since last therapy session. She is hoping to play in an upcoming member guest golf tornament. No specific  questions or concerns currently.    Pertinent History Pts main complaint is her balance. Has a fear of falling but has not actually fallen. Stated she will tend to lean or start falling backwards, and reports some staggering in her gait path as well. Also reported she would like to work on her leg strength to make it easier to transfer like off/on the commode, out of chairs. Patient has been in therapy previously for vertigo as well as general conditioning. Has had issue with her balance for less than 5 years. Feels her balance has worsened in the last few weeks. Pt does have chronic R knee pain and R shoulder pain. Pt also reported  chronic DOE, that she feels has worsened lately as well. No AD use in her home, but will use her [facility] or rollator for community ambulation. Pt also mentioned some neck pain and potential radicular symptoms to RUE as well as chronic R rotator cuff tear.   PMH of afib, temporal arteritis, pacemaker (sees a cardiologist regularly), HTN, meningioma, CKDIII, NSTEMI, chronic headaches, breast cancer, TIA about 4 years ago.    How long can you sit comfortably? NA    How long can you stand comfortably? patient reported her R knee limits her ability to stand for long periods    How long can you walk comfortably? Pt feels winded by shorter distances now, for example from parking lot to store. Has to stop and catch her breath    Patient Stated Goals improve balance,    Currently in Pain? No/denies             TREATMENT   Ther-ex Attempted SciFit but too difficult for patient so discontinued  Seated hip flexion marches with green tband around knees 2 x 10; Seated clams with green tband around knees 2 x 10; Seated adductor isometric ball squeeze 3s hold 2 x 10;  Standing minis quats with BUE support 2 x 10;  Standing exercises with 4# ankle weights (AW): Hip abduction x 1 minute; HS curls x 1 minute;  Hip extension x 1 minute;  Seated alternating LAQ with 4# ankle weights x 1 minute; Sit to stand from regular height chair with Airex pad on seat x 4, discontinued due to knee pain;  Intermittent rest breaks;   Neuromuscular Re-education  Practiced golf swings on blue mat to challenge dynamic balance. Utilized both guitar picks (more challenging) as well as foam balls to vary difficulty and allow pt to progress to longer swings; Gait in grass (uneven surface) forward, backward, and side stepping. Also practiced ascending/descend curb with single point cane.   Pt educated throughout session about proper posture and technique with exercises. Improved exercise technique, movement at  target joints, use of target muscles after min to mod verbal, visual, tactile cues.   Patient demonstrates excellent motivation during session today.Session focused on both balanceand strengthening. She would like to participate in a member guest golf tournament in a few weeks so practiced dynamic balance by swinging a golf club on a soft blue mat. Utilized different objects to vary difficulty such as a guitar pick and a foam golf ball. Also practiced dynamic balance by walking across uneven grass as has been performed in previous sessions. Overall she making excellent progress towards her goals to improve balance and decrease fall risk. Patient encouraged tocontinue currentHEP and follow-up as scheduled.Shewill benefit from PT services to address deficits in strength, balance, and mobility in order to return to full function at home.  PT Short Term Goals - 11/04/20 1109      PT SHORT TERM GOAL #1   Title Pt will be independent with initial HEP in order to improve strength and balance in order to decrease fall risk and improve function at home    Baseline to be administered next session    Time 4    Period Weeks    Status On-going    Target Date 12/02/20             PT Long Term Goals - 11/04/20 1109      PT LONG TERM GOAL #1   Title Pt will be independent with finalized HEP in order to improve strength and balance in order to decrease fall risk and improve function at home    Time 8    Period Weeks    Status On-going    Target Date 12/30/20      PT LONG TERM GOAL #2   Title Pt will improve BERG to at least 50/56 in order to demonstrate clinically significant improvement in balance.    Baseline 42/56 on eval 3/18; 10/28/20: 47/56    Time 8    Period Weeks    Status Partially Met    Target Date 12/30/20      PT LONG TERM GOAL #3   Title Pt will decrease 5TSTS by at least 3 seconds in order to demonstrate clinically  significant improvement in LE strength.    Baseline 35seconds with BUE support on eval 3/18; 10/28/20: 19.5s with BUE support    Time 8    Period Weeks    Status Partially Met    Target Date 12/30/20      PT LONG TERM GOAL #4   Title Pt will improve DGI by at least 3 points in order to demonstrate clinically significant improvement in balance and decreased risk for falls.    Baseline 09/12/20: 17/24, 10/28/20: 18/24    Time 8    Period Weeks    Status Partially Met    Target Date 12/30/20      PT LONG TERM GOAL #5   Title Patient will demonstrate at 12 point improvement in her FOTO score indicating improved function and safety with functional activities.    Baseline 3/18 eval 43; 10/28/20: 53    Time 8    Period Weeks    Status Partially Met    Target Date 12/30/20      PT LONG TERM GOAL #6   Title The patient will demonstrate at least 1/2 grade improvement in LLE MMT indicating improved strength and ability to perform functional activities such as transfers, ambulation, stairs, curbs, etc    Baseline on eval 3/18 grossly 3+/5 for hip strength; 10/28/20: Partially met, see visit note    Time 8    Period Weeks    Status Partially Met    Target Date 12/30/20                 Plan - 11/07/20 1306    Clinical Impression Statement Patient demonstrates excellent motivation during session today.  Session focused on both balance and strengthening. She would like to participate in a member guest golf tournament in a few weeks so practiced dynamic balance by swinging a golf club on a soft blue mat. Utilized different objects to vary difficulty such as a guitar pick and a foam golf ball. Also practiced dynamic balance by walking across uneven grass as has been performed in previous sessions. Overall she  making excellent progress towards her goals to improve balance and decrease fall risk. Patient encouraged to continue current HEP and follow-up as scheduled.  She will benefit from PT services to  address deficits in strength, balance, and mobility in order to return to full function at home.    Personal Factors and Comorbidities Age;Comorbidity 3+;Fitness    Comorbidities afib, temporal arteritis, pacemaker (sees a cardiologist regularly), HTN, meningioma, CKDIII, NSTEMI, chronic headaches, breast cancer, TIA about 4 years ago.    Examination-Activity Limitations Transfers;Bed Mobility;Locomotion Level;Stairs;Stand;Reach Overhead;Squat;Lift;Bend    Examination-Participation Restrictions Church;Laundry;Cleaning;Shop;Community Activity;Volunteer;Meal Prep;Interpersonal Relationship    Stability/Clinical Decision Making Stable/Uncomplicated    Rehab Potential Good    PT Frequency 2x / week    PT Duration 8 weeks    PT Treatment/Interventions Canalith Repostioning;ADLs/Self Care Home Management;Gait training;Stair training;Therapeutic activities;Therapeutic exercise;Balance training;Neuromuscular re-education;Patient/family education;Vestibular;Cryotherapy;Moist Heat;Electrical Stimulation;Splinting;Taping;Joint Manipulations;Spinal Manipulations;Dry needling;Energy conservation;Passive range of motion;Aquatic Therapy    PT Next Visit Plan balance and strength interventions    PT Home Exercise Plan Access Code: [email or web address]    Consulted and Agree with Plan of Care Patient           Patient will benefit from skilled therapeutic intervention in order to improve the following deficits and impairments:  Abnormal gait,Decreased balance,Decreased endurance,Difficulty walking,Cardiopulmonary status limiting activity,Decreased activity tolerance,Decreased strength  Visit Diagnosis: Difficulty in walking, not elsewhere classified  Muscle weakness (generalized)     Problem List Patient Active Problem List   Diagnosis Date Noted  . Swelling of limb 11/01/2020  . Intracranial carotid stenosis, right 05/29/2019  . Chronic headaches 05/29/2019  . Long term current use of systemic steroids  05/04/2019  . Screening for osteoporosis 05/04/2019  . Temporal arteritis ([place]) 04/07/2019  . Meningioma ([place]) 12/29/2018  . Chronic daily headache 11/20/2018  . Elevated erythrocyte sedimentation rate 11/05/2018  . CKD (chronic kidney disease) stage 3, GFR 30-59 ml/min (H[facility]) 12/27/2017  . Shingles 05/06/2017  . 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 Primary osteoarthritis of right shoulder 12/07/2016  . Rotator cuff tear, right 12/07/2016  . Rotator cuff tendinitis, right 12/07/2016  . CHB (complete heart block) ([place]) 06/30/2016  . Severe mitral regurgitation 05/29/2016  . Pedal edema 04/23/2016  . Lung nodule, multiple 12/28/2015  . Amnesia, global, transient 04/05/2015  . 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 Cardiac pacemaker 05/18/2014  . Sleep disorder 05/06/2014  . Diffuse pain 04/06/2014  . Fatigue 03/16/2014  . Headache 03/16/2014  . History of melanoma 11/24/2013  . Melanoma ([place]) 11/24/2013  . DCIS (ductal carcinoma in situ) 08/11/2013  . History of ductal carcinoma in situ (DCIS) of breast 08/11/2013  . H/O non-ST elevation myocardial infarction (NSTEMI) 01/01/2013  . Hearing impairment 01/01/2013  . S/P ablation of atrial fibrillation 07/11/2012  . Edema 01/23/2012  . Hypertension 01/23/2012  . Atrial fibrillation ([place]) 11/27/2011  . Coronary artery disease 11/27/2011   Lyndel Safe Jakeim Sedore PT, DPT, GCS  Homer Pfeifer 11/07/2020, 1:11 PM   [facility] [address] [place], [place], [address] Phone: [phone]   Fax:  [phone]  Name: Charlotte Glover MRN: [id number] Date of Birth: [birth year]

## 2020-11-11 ENCOUNTER — Ambulatory Visit: Payer: Medicare Other

## 2020-11-17 ENCOUNTER — Ambulatory Visit: Payer: Medicare Other

## 2020-11-18 ENCOUNTER — Ambulatory Visit: Payer: Medicare Other

## 2020-11-18 ENCOUNTER — Other Ambulatory Visit: Payer: Self-pay

## 2020-11-18 DIAGNOSIS — R262 Difficulty in walking, not elsewhere classified: Secondary | ICD-10-CM | POA: Diagnosis not present

## 2020-11-18 DIAGNOSIS — M6281 Muscle weakness (generalized): Secondary | ICD-10-CM

## 2020-11-18 NOTE — Therapy (Signed)
[facility] [address] [place], [place], [address] Phone: [phone]   Fax:  [phone]  Physical Therapy Treatment  Patient Details  Name: Charlotte Glover MRN: [id number] Date of Birth: 01/17/1928 No data recorded  Encounter Date: 11/18/2020   PT End of Session - 11/18/20 1027    Visit Number 13    Number of Visits 33    Date for PT Re-Evaluation 12/16/20    Authorization Type medicare    PT Start Time 1020    PT Stop Time 1100    PT Time Calculation (min) 40 min    Equipment Utilized During Treatment Gait belt    Activity Tolerance Patient tolerated treatment well    Behavior During Therapy WFL for tasks assessed/performed           Past Medical History:  Diagnosis Date  . Arthritis   . Atrial fibrillation ([place])   . Breast cancer ([place]) 2015   DCIS  . Cancer ([place])   . Cardiac arrest ([place])   . CHF (congestive heart failure) ([place])   . Chickenpox   . Coronary artery disease   . Dyspnea   . Dysrhythmia   . Edema   . GERD (gastroesophageal reflux disease)   . Hearing aid worn    bilateral  . History of MI (myocardial infarction)   . HOH (hard of hearing)   . Hypertension 01/23/2012  . Myocardial infarction ([place]) 01/01/2013  . S/P ablation of atrial fibrillation   . Shingles     Past Surgical History:  Procedure Laterality Date  . ANTERIOR VITRECTOMY Left 03/20/2017   Procedure: ANTERIOR VITRECTOMY;  Surgeon: Leandrew Koyanagi, MD;  Location: [place];  Service: Ophthalmology;  Laterality: Left;  IVA TOPICAL LEFT  . ARTERY BIOPSY Right 04/08/2019   Procedure: BIOPSY TEMPORAL ARTERY;  Surgeon: Algernon Huxley, MD;  Location: [facility];  Service: Vascular;  Laterality: Right;  . ATRIAL ABLATION SURGERY    . BREAST BIOPSY Right 2015   + for DCIS   . BREAST SURGERY    . CARDIAC CATHETERIZATION    . CARDIAC CATHETERIZATION Bilateral 05/08/2016   Procedure: Right/Left Heart Cath and Coronary  Angiography;  Surgeon: Isaias Cowman, MD;  Location: [place];  Service: Cardiovascular;  Laterality: Bilateral;  . CATARACT EXTRACTION W/PHACO Left 03/20/2017   Procedure: IOL EXCHANGE;  Surgeon: Leandrew Koyanagi, MD;  Location: [facility];  Service: Ophthalmology;  Laterality: Left;  . CHOLECYSTECTOMY    . DILATION AND CURETTAGE OF UTERUS    . ESOPHAGOGASTRODUODENOSCOPY    . PACEMAKER INSERTION    . PERIPHERAL IRIDOTOMY Left 03/20/2017   Procedure: PERIPHERAL IRIDECTOMY;  Surgeon: Leandrew Koyanagi, MD;  Location: [facility];  Service: Ophthalmology;  Laterality: Left;  . ROTATOR CUFF REPAIR Left   . TEE WITHOUT CARDIOVERSION N/A 05/16/2016   Procedure: TRANSESOPHAGEAL ECHOCARDIOGRAM (TEE);  Surgeon: Teodoro Spray, MD;  Location: [facility];  Service: Cardiovascular;  Laterality: N/A;  . TONSILLECTOMY    . uterine polyp      There were no vitals filed for this visit.   Subjective Assessment - 11/18/20 1025    Subjective Pt states that she is doing well today. She denies any resting pain upon arrival today but has been having some pain in her R lateral hip where she has previously had bursitis. No stumbles or falls reported since last therapy session. No specific questions or concerns currently.    Pertinent History Pts main  complaint is her balance. Has a fear of falling but has not actually fallen. Stated she will tend to lean or start falling backwards, and reports some staggering in her gait path as well. Also reported she would like to work on her leg strength to make it easier to transfer like off/on the commode, out of chairs. Patient has been in therapy previously for vertigo as well as general conditioning. Has had issue with her balance for less than 5 years. Feels her balance has worsened in the last few weeks. Pt does have chronic R knee pain and R shoulder pain. Pt also reported chronic DOE, that she feels has worsened lately as well. No AD  use in her home, but will use her [facility] or rollator for community ambulation. Pt also mentioned some neck pain and potential radicular symptoms to RUE as well as chronic R rotator cuff tear.   PMH of afib, temporal arteritis, pacemaker (sees a cardiologist regularly), HTN, meningioma, CKDIII, NSTEMI, chronic headaches, breast cancer, TIA about 4 years ago.    How long can you sit comfortably? NA    How long can you stand comfortably? patient reported her R knee limits her ability to stand for long periods    How long can you walk comfortably? Pt feels winded by shorter distances now, for example from parking lot to store. Has to stop and catch her breath    Patient Stated Goals improve balance,    Currently in Pain? No/denies                TREATMENT   Neuromuscular Re-education NuStep L2 x 10 minutes; Pt reports dizziness occasionally when laying down and at other times when sitting in her recliner. She saw neurology who was concerned it might be related to her blood pressure. Symptoms last a few seconds. Dix-Hallpike and Roll tests performed and are negative bilaterally for both vertigo and nystagmus;  Practiced golf swings on blue mat to challenge dynamic balance. Utilized foam balls and pt performed in both wide and narrow stances to challenge balance. Length of swing also varied.   Patient demonstrates excellent motivation during session today. Pt reports dizziness occasionally when laying down and at other times when sitting in her recliner. She saw neurology who was concerned it might be related to her blood pressure. Symptoms last a few seconds. Dix-Hallpike and Roll tests performed and are negative bilaterally for both vertigo and nystagmus. Also practiced golf swings on blue mat to challenge dynamic balance. Utilized foam balls and pt performed in both wide and narrow stances to challenge balance. Length of swing also varied.Overall she making excellent progress towards her  goals to improve balance and decrease fall risk.Patient encouraged tocontinue currentHEP and follow-up as scheduled.Shewill benefit from PT services to address deficits in strength, balance, and mobility in order to return to full function at home.                            PT Short Term Goals - 11/04/20 1109      PT SHORT TERM GOAL #1   Title Pt will be independent with initial HEP in order to improve strength and balance in order to decrease fall risk and improve function at home    Baseline to be administered next session    Time 4    Period Weeks    Status On-going    Target Date 12/02/20  PT Long Term Goals - 11/04/20 1109      PT LONG TERM GOAL #1   Title Pt will be independent with finalized HEP in order to improve strength and balance in order to decrease fall risk and improve function at home    Time 8    Period Weeks    Status On-going    Target Date 12/30/20      PT LONG TERM GOAL #2   Title Pt will improve BERG to at least 50/56 in order to demonstrate clinically significant improvement in balance.    Baseline 42/56 on eval 3/18; 10/28/20: 47/56    Time 8    Period Weeks    Status Partially Met    Target Date 12/30/20      PT LONG TERM GOAL #3   Title Pt will decrease 5TSTS by at least 3 seconds in order to demonstrate clinically significant improvement in LE strength.    Baseline 35seconds with BUE support on eval 3/18; 10/28/20: 19.5s with BUE support    Time 8    Period Weeks    Status Partially Met    Target Date 12/30/20      PT LONG TERM GOAL #4   Title Pt will improve DGI by at least 3 points in order to demonstrate clinically significant improvement in balance and decreased risk for falls.    Baseline 09/12/20: 17/24, 10/28/20: 18/24    Time 8    Period Weeks    Status Partially Met    Target Date 12/30/20      PT LONG TERM GOAL #5   Title Patient will demonstrate at 12 point improvement in her FOTO score  indicating improved function and safety with functional activities.    Baseline 3/18 eval 43; 10/28/20: 53    Time 8    Period Weeks    Status Partially Met    Target Date 12/30/20      PT LONG TERM GOAL #6   Title The patient will demonstrate at least 1/2 grade improvement in LLE MMT indicating improved strength and ability to perform functional activities such as transfers, ambulation, stairs, curbs, etc    Baseline on eval 3/18 grossly 3+/5 for hip strength; 10/28/20: Partially met, see visit note    Time 8    Period Weeks    Status Partially Met    Target Date 12/30/20                 Plan - 11/18/20 1028    Clinical Impression Statement Patient demonstrates excellent motivation during session today. Pt reports dizziness occasionally when laying down and at other times when sitting in her recliner. She saw neurology who was concerned it might be related to her blood pressure. Symptoms last a few seconds. Dix-Hallpike and Roll tests performed and are negative bilaterally for both vertigo and nystagmus. Also practiced golf swings on blue mat to challenge dynamic balance. Utilized foam balls and pt performed in both wide and narrow stances to challenge balance. Length of swing also varied. Overall she making excellent progress towards her goals to improve balance and decrease fall risk. Patient encouraged to continue current HEP and follow-up as scheduled.  She will benefit from PT services to address deficits in strength, balance, and mobility in order to return to full function at home.    Personal Factors and Comorbidities Age;Comorbidity 3+;Fitness    Comorbidities afib, temporal arteritis, pacemaker (sees a cardiologist regularly), HTN, meningioma, CKDIII, NSTEMI, chronic headaches, breast cancer,  TIA about 4 years ago.    Examination-Activity Limitations Transfers;Bed Mobility;Locomotion Level;Stairs;Stand;Reach Overhead;Squat;Lift;Bend    Examination-Participation Restrictions  Church;Laundry;Cleaning;Shop;Community Activity;Volunteer;Meal Prep;Interpersonal Relationship    Stability/Clinical Decision Making Stable/Uncomplicated    Rehab Potential Good    PT Frequency 2x / week    PT Duration 8 weeks    PT Treatment/Interventions Canalith Repostioning;ADLs/Self Care Home Management;Gait training;Stair training;Therapeutic activities;Therapeutic exercise;Balance training;Neuromuscular re-education;Patient/family education;Vestibular;Cryotherapy;Moist Heat;Electrical Stimulation;Splinting;Taping;Joint Manipulations;Spinal Manipulations;Dry needling;Energy conservation;Passive range of motion;Aquatic Therapy    PT Next Visit Plan balance and strength interventions    PT Home Exercise Plan Access Code: [email or web address]    Consulted and Agree with Plan of Care Patient           Patient will benefit from skilled therapeutic intervention in order to improve the following deficits and impairments:  Abnormal gait,Decreased balance,Decreased endurance,Difficulty walking,Cardiopulmonary status limiting activity,Decreased activity tolerance,Decreased strength  Visit Diagnosis: Difficulty in walking, not elsewhere classified  Muscle weakness (generalized)     Problem List Patient Active Problem List   Diagnosis Date Noted  . Swelling of limb 11/01/2020  . Intracranial carotid stenosis, right 05/29/2019  . Chronic headaches 05/29/2019  . Long term current use of systemic steroids 05/04/2019  . Screening for osteoporosis 05/04/2019  . Temporal arteritis ([place]) 04/07/2019  . Meningioma ([place]) 12/29/2018  . Chronic daily headache 11/20/2018  . Elevated erythrocyte sedimentation rate 11/05/2018  . CKD (chronic kidney disease) stage 3, GFR 30-59 ml/min ([facility]) 12/27/2017  . Shingles 05/06/2017  . 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 Primary osteoarthritis of right shoulder 12/07/2016  . Rotator cuff tear, right 12/07/2016  . Rotator cuff tendinitis,  right 12/07/2016  . CHB (complete heart block) ([place]) 06/30/2016  . Severe mitral regurgitation 05/29/2016  . Pedal edema 04/23/2016  . Lung nodule, multiple 12/28/2015  . Amnesia, global, transient 04/05/2015  . 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 Cardiac pacemaker 05/18/2014  . Sleep disorder 05/06/2014  . Diffuse pain 04/06/2014  . Fatigue 03/16/2014  . Headache 03/16/2014  . History of melanoma 11/24/2013  . Melanoma ([place]) 11/24/2013  . DCIS (ductal carcinoma in situ) 08/11/2013  . History of ductal carcinoma in situ (DCIS) of breast 08/11/2013  . H/O non-ST elevation myocardial infarction (NSTEMI) 01/01/2013  . Hearing impairment 01/01/2013  . S/P ablation of atrial fibrillation 07/11/2012  . Edema 01/23/2012  . Hypertension 01/23/2012  . Atrial fibrillation ([place]) 11/27/2011  . Coronary artery disease 11/27/2011   Lyndel Safe Kenzley Ke PT, DPT, GCS  Kenshin Splawn 11/18/2020, 9:01 PM  [facility] [address] [place], [place], [address] Phone: [phone]   Fax:  [phone]  Name: Charlotte Glover MRN: [id number] Date of Birth: 06-01-1928

## 2020-11-22 ENCOUNTER — Ambulatory Visit (INDEPENDENT_AMBULATORY_CARE_PROVIDER_SITE_OTHER): Payer: Medicare Other | Admitting: Vascular Surgery

## 2020-11-22 ENCOUNTER — Encounter (INDEPENDENT_AMBULATORY_CARE_PROVIDER_SITE_OTHER): Payer: Medicare Other

## 2020-11-23 ENCOUNTER — Encounter (INDEPENDENT_AMBULATORY_CARE_PROVIDER_SITE_OTHER): Payer: Self-pay | Admitting: Vascular Surgery

## 2020-11-25 ENCOUNTER — Ambulatory Visit: Payer: Medicare Other

## 2020-11-28 ENCOUNTER — Ambulatory Visit: Payer: Medicare Other

## 2020-11-30 ENCOUNTER — Ambulatory Visit: Payer: Medicare Other | Attending: Internal Medicine

## 2020-11-30 ENCOUNTER — Other Ambulatory Visit: Payer: Self-pay

## 2020-11-30 DIAGNOSIS — M25561 Pain in right knee: Secondary | ICD-10-CM | POA: Insufficient documentation

## 2020-11-30 DIAGNOSIS — R2689 Other abnormalities of gait and mobility: Secondary | ICD-10-CM | POA: Diagnosis present

## 2020-11-30 DIAGNOSIS — R262 Difficulty in walking, not elsewhere classified: Secondary | ICD-10-CM | POA: Diagnosis not present

## 2020-11-30 DIAGNOSIS — G8929 Other chronic pain: Secondary | ICD-10-CM | POA: Diagnosis present

## 2020-11-30 DIAGNOSIS — M6281 Muscle weakness (generalized): Secondary | ICD-10-CM | POA: Insufficient documentation

## 2020-11-30 NOTE — Therapy (Signed)
[facility] [address] [place], [place], [address] Phone: [phone]   Fax:  [phone]  Physical Therapy Treatment  Patient Details  Name: Charlotte Glover MRN: [id number] Date of Birth: [birth year] No data recorded  Encounter Date: 11/30/2020   PT End of Session - 11/30/20 1407    Visit Number 14    Number of Visits 33    Date for PT Re-Evaluation 12/16/20    Authorization Type medicare    PT Start Time 1400    PT Stop Time 1445    PT Time Calculation (min) 45 min    Equipment Utilized During Treatment Gait belt    Activity Tolerance Patient tolerated treatment well    Behavior During Therapy WFL for tasks assessed/performed           Past Medical History:  Diagnosis Date  . Arthritis   . Atrial fibrillation ([place])   . Breast cancer ([place]) 2015   DCIS  . Cancer ([place])   . Cardiac arrest ([place])   . CHF (congestive heart failure) ([place])   . Chickenpox   . Coronary artery disease   . Dyspnea   . Dysrhythmia   . Edema   . GERD (gastroesophageal reflux disease)   . Hearing aid worn    bilateral  . History of MI (myocardial infarction)   . HOH (hard of hearing)   . Hypertension 01/23/2012  . Myocardial infarction ([place]) 01/01/2013  . S/P ablation of atrial fibrillation   . Shingles     Past Surgical History:  Procedure Laterality Date  . ANTERIOR VITRECTOMY Left 03/20/2017   Procedure: ANTERIOR VITRECTOMY;  Surgeon: Leandrew Koyanagi, MD;  Location: [place];  Service: Ophthalmology;  Laterality: Left;  IVA TOPICAL LEFT  . ARTERY BIOPSY Right 04/08/2019   Procedure: BIOPSY TEMPORAL ARTERY;  Surgeon: Algernon Huxley, MD;  Location: [facility];  Service: Vascular;  Laterality: Right;  . ATRIAL ABLATION SURGERY    . BREAST BIOPSY Right 2015   + for DCIS   . BREAST SURGERY    . CARDIAC CATHETERIZATION    . CARDIAC CATHETERIZATION Bilateral 05/08/2016   Procedure: Right/Left Heart Cath and Coronary  Angiography;  Surgeon: Isaias Cowman, MD;  Location: [facility];  Service: Cardiovascular;  Laterality: Bilateral;  . CATARACT EXTRACTION W/PHACO Left 03/20/2017   Procedure: IOL EXCHANGE;  Surgeon: Leandrew Koyanagi, MD;  Location: [place];  Service: Ophthalmology;  Laterality: Left;  . CHOLECYSTECTOMY    . DILATION AND CURETTAGE OF UTERUS    . ESOPHAGOGASTRODUODENOSCOPY    . PACEMAKER INSERTION    . PERIPHERAL IRIDOTOMY Left 03/20/2017   Procedure: PERIPHERAL IRIDECTOMY;  Surgeon: Leandrew Koyanagi, MD;  Location: [facility];  Service: Ophthalmology;  Laterality: Left;  . ROTATOR CUFF REPAIR Left   . TEE WITHOUT CARDIOVERSION N/A 05/16/2016   Procedure: TRANSESOPHAGEAL ECHOCARDIOGRAM (TEE);  Surgeon: Teodoro Spray, MD;  Location: [facility];  Service: Cardiovascular;  Laterality: N/A;  . TONSILLECTOMY    . uterine polyp      There were no vitals filed for this visit.   Subjective Assessment - 11/30/20 1406    Subjective Pt states that she is doing well today. She denies any resting pain upon arrival today. No stumbles or falls reported since last therapy session. She continues with fatigue since recovering from [place]. No specific questions or concerns currently.    Pertinent History Pts main complaint is her balance. Has a fear of falling  but has not actually fallen. Stated she will tend to lean or start falling backwards, and reports some staggering in her gait path as well. Also reported she would like to work on her leg strength to make it easier to transfer like off/on the commode, out of chairs. Patient has been in therapy previously for vertigo as well as general conditioning. Has had issue with her balance for less than 5 years. Feels her balance has worsened in the last few weeks. Pt does have chronic R knee pain and R shoulder pain. Pt also reported chronic DOE, that she feels has worsened lately as well. No AD use in her home, but will use her  [facility] or rollator for community ambulation. Pt also mentioned some neck pain and potential radicular symptoms to RUE as well as chronic R rotator cuff tear.   PMH of afib, temporal arteritis, pacemaker (sees a cardiologist regularly), HTN, meningioma, CKDIII, NSTEMI, chronic headaches, breast cancer, TIA about 4 years ago.    How long can you sit comfortably? NA    How long can you stand comfortably? patient reported her R knee limits her ability to stand for long periods    How long can you walk comfortably? Pt feels winded by shorter distances now, for example from parking lot to store. Has to stop and catch her breath    Patient Stated Goals improve balance,    Currently in Pain? No/denies               TREATMENT   Neuromuscular Re-education Practiced golf swings on blue mat to challenge dynamic balance. Utilized foam balls and pt performed in both wide and narrow stances to challenge balance. Length of swing also varied. Standing alternating cone taps on blue mat in multiple directions on command; Standing balloon taps with therapist x 3 minutes;   Ther-ex  NuStep L2 x 3 minutes for warm-up during history, therapist monitoring fatigue; 6# med ball lateral ball tosses x 10 to each side;  Standing exercises with 3# ankle weights: Hip flexion marches x 30s; Hip abduction x 30s; Hamstrings curls x 30s; Hip extension x 30s;  Standing mini squats with BUE support x 15; Seated LAQ with 3# ankle weights; Sit to stand without UE support x 6; Seated clams with green tband x 10; Seated adductor ball squeeze x 10;    Pt educated throughout session about proper posture and technique with exercises. Improved exercise technique, movement at target joints, use of target muscles after min to mod verbal, visual, tactile cues.    Patient demonstrates excellent motivation during session today. Continued with LE strengthening today with intermittent seated rest break. Also continued  withgolf swings on blue mat to challenge dynamic balance. Utilized foam balls and pt performed in both wide and narrow stances to challenge balance. Length of swing also varied.Pt is playing golf on Friday for the first time in a year. Overall she making excellent progress towards her goals to improve balance and decrease fall risk.Patient encouraged tocontinue currentHEP and follow-up as scheduled.Shewill benefit from PT services to address deficits in strength, balance, and mobility in order to return to full function at home.                                 PT Short Term Goals - 11/04/20 1109      PT SHORT TERM GOAL #1   Title Pt will be independent with initial  HEP in order to improve strength and balance in order to decrease fall risk and improve function at home    Baseline to be administered next session    Time 4    Period Weeks    Status On-going    Target Date 12/02/20             PT Long Term Goals - 11/04/20 1109      PT LONG TERM GOAL #1   Title Pt will be independent with finalized HEP in order to improve strength and balance in order to decrease fall risk and improve function at home    Time 8    Period Weeks    Status On-going    Target Date 12/30/20      PT LONG TERM GOAL #2   Title Pt will improve BERG to at least 50/56 in order to demonstrate clinically significant improvement in balance.    Baseline 42/56 on eval 3/18; 10/28/20: 47/56    Time 8    Period Weeks    Status Partially Met    Target Date 12/30/20      PT LONG TERM GOAL #3   Title Pt will decrease 5TSTS by at least 3 seconds in order to demonstrate clinically significant improvement in LE strength.    Baseline 35seconds with BUE support on eval 3/18; 10/28/20: 19.5s with BUE support    Time 8    Period Weeks    Status Partially Met    Target Date 12/30/20      PT LONG TERM GOAL #4   Title Pt will improve DGI by at least 3 points in order to demonstrate  clinically significant improvement in balance and decreased risk for falls.    Baseline 09/12/20: 17/24, 10/28/20: 18/24    Time 8    Period Weeks    Status Partially Met    Target Date 12/30/20      PT LONG TERM GOAL #5   Title Patient will demonstrate at 12 point improvement in her FOTO score indicating improved function and safety with functional activities.    Baseline 3/18 eval 43; 10/28/20: 53    Time 8    Period Weeks    Status Partially Met    Target Date 12/30/20      PT LONG TERM GOAL #6   Title The patient will demonstrate at least 1/2 grade improvement in LLE MMT indicating improved strength and ability to perform functional activities such as transfers, ambulation, stairs, curbs, etc    Baseline on eval 3/18 grossly 3+/5 for hip strength; 10/28/20: Partially met, see visit note    Time 8    Period Weeks    Status Partially Met    Target Date 12/30/20                 Plan - 11/30/20 1407    Clinical Impression Statement Patient demonstrates excellent motivation during session today. Continued with LE strengthening today with intermittent seated rest break. Also continued withgolf swings on blue mat to challenge dynamic balance. Utilized foam balls and pt performed in both wide and narrow stances to challenge balance. Length of swing also varied. Pt is playing golf on Friday for the first time in a year. Overall she making excellent progress towards her goals to improve balance and decrease fall risk. Patient encouraged to continue current HEP and follow-up as scheduled.  She will benefit from PT services to address deficits in strength, balance, and mobility in order to  return to full function at home.    Personal Factors and Comorbidities Age;Comorbidity 3+;Fitness    Comorbidities afib, temporal arteritis, pacemaker (sees a cardiologist regularly), HTN, meningioma, CKDIII, NSTEMI, chronic headaches, breast cancer, TIA about 4 years ago.    Examination-Activity Limitations  Transfers;Bed Mobility;Locomotion Level;Stairs;Stand;Reach Overhead;Squat;Lift;Bend    Examination-Participation Restrictions Church;Laundry;Cleaning;Shop;Community Activity;Volunteer;Meal Prep;Interpersonal Relationship    Stability/Clinical Decision Making Stable/Uncomplicated    Rehab Potential Good    PT Frequency 2x / week    PT Duration 8 weeks    PT Treatment/Interventions Canalith Repostioning;ADLs/Self Care Home Management;Gait training;Stair training;Therapeutic activities;Therapeutic exercise;Balance training;Neuromuscular re-education;Patient/family education;Vestibular;Cryotherapy;Moist Heat;Electrical Stimulation;Splinting;Taping;Joint Manipulations;Spinal Manipulations;Dry needling;Energy conservation;Passive range of motion;Aquatic Therapy    PT Next Visit Plan balance and strength interventions    PT Home Exercise Plan Access Code: [email or web address]    Consulted and Agree with Plan of Care Patient           Patient will benefit from skilled therapeutic intervention in order to improve the following deficits and impairments:  Abnormal gait,Decreased balance,Decreased endurance,Difficulty walking,Cardiopulmonary status limiting activity,Decreased activity tolerance,Decreased strength  Visit Diagnosis: Difficulty in walking, not elsewhere classified  Muscle weakness (generalized)     Problem List Patient Active Problem List   Diagnosis Date Noted  . Swelling of limb 11/01/2020  . Intracranial carotid stenosis, right 05/29/2019  . Chronic headaches 05/29/2019  . Long term current use of systemic steroids 05/04/2019  . Screening for osteoporosis 05/04/2019  . Temporal arteritis ([place]) 04/07/2019  . Meningioma ([place]) 12/29/2018  . Chronic daily headache 11/20/2018  . Elevated erythrocyte sedimentation rate 11/05/2018  . CKD (chronic kidney disease) stage 3, GFR 30-59 ml/min (H[facility]) 12/27/2017  . Shingles 05/06/2017  . 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 Primary osteoarthritis of right shoulder 12/07/2016  . Rotator cuff tear, right 12/07/2016  . Rotator cuff tendinitis, right 12/07/2016  . CHB (complete heart block) ([place]) 06/30/2016  . Severe mitral regurgitation 05/29/2016  . Pedal edema 04/23/2016  . Lung nodule, multiple 12/28/2015  . Amnesia, global, transient 04/05/2015  . 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 Cardiac pacemaker 05/18/2014  . Sleep disorder 05/06/2014  . Diffuse pain 04/06/2014  . Fatigue 03/16/2014  . Headache 03/16/2014  . History of melanoma 11/24/2013  . Melanoma ([place]) 11/24/2013  . DCIS (ductal carcinoma in situ) 08/11/2013  . History of ductal carcinoma in situ (DCIS) of breast 08/11/2013  . H/O non-ST elevation myocardial infarction (NSTEMI) 01/01/2013  . Hearing impairment 01/01/2013  . S/P ablation of atrial fibrillation 07/11/2012  . Edema 01/23/2012  . Hypertension 01/23/2012  . Atrial fibrillation ([place]) 11/27/2011  . Coronary artery disease 11/27/2011   Lyndel Safe Olufemi Mofield PT, DPT, GCS  Bryten Maher 11/30/2020, 10:42 PM  [place] [facility] [address] [place], [place], [address] Phone: [phone]   Fax:  [phone]  Name: Peytin Dechert MRN: [id number] Date of Birth: Jul 31, 1927

## 2020-12-05 ENCOUNTER — Ambulatory Visit: Payer: Medicare Other

## 2020-12-05 ENCOUNTER — Other Ambulatory Visit: Payer: Self-pay

## 2020-12-05 DIAGNOSIS — M6281 Muscle weakness (generalized): Secondary | ICD-10-CM

## 2020-12-05 DIAGNOSIS — R262 Difficulty in walking, not elsewhere classified: Secondary | ICD-10-CM

## 2020-12-05 NOTE — Therapy (Signed)
[facility] [address] [place], [place], [address] Phone: [phone]   Fax:  [phone]  Physical Therapy Treatment  Patient Details  Name: Charlotte Glover MRN: [id number] Date of Birth: 21-Aug-1927 No data recorded  Encounter Date: 12/05/2020   PT End of Session - 12/05/20 1150     Visit Number 15    Number of Visits 33    Date for PT Re-Evaluation 12/16/20    Authorization Type medicare    PT Start Time 1150    PT Stop Time 30    PT Time Calculation (min) 40 min    Equipment Utilized During Treatment Gait belt    Activity Tolerance Patient tolerated treatment well    Behavior During Therapy WFL for tasks assessed/performed             Past Medical History:  Diagnosis Date   Arthritis    Atrial fibrillation ([place])    Breast cancer ([place]) 2015   DCIS   Cancer ([place])    Cardiac arrest ([place])    CHF (congestive heart failure) ([place])    Chickenpox    Coronary artery disease    Dyspnea    Dysrhythmia    Edema    GERD (gastroesophageal reflux disease)    Hearing aid worn    bilateral   History of MI (myocardial infarction)    HOH (hard of hearing)    Hypertension 01/23/2012   Myocardial infarction ([place]) 01/01/2013   S/P ablation of atrial fibrillation    Shingles     Past Surgical History:  Procedure Laterality Date   ANTERIOR VITRECTOMY Left 03/20/2017   Procedure: ANTERIOR VITRECTOMY;  Surgeon: Leandrew Koyanagi, MD;  Location: [place];  Service: Ophthalmology;  Laterality: Left;  IVA TOPICAL LEFT   ARTERY BIOPSY Right 04/08/2019   Procedure: BIOPSY TEMPORAL ARTERY;  Surgeon: Algernon Huxley, MD;  Location: [facility];  Service: Vascular;  Laterality: Right;   ATRIAL ABLATION SURGERY     BREAST BIOPSY Right 2015   + for DCIS    BREAST SURGERY     CARDIAC CATHETERIZATION     CARDIAC CATHETERIZATION Bilateral 05/08/2016   Procedure: Right/Left Heart Cath and Coronary Angiography;  Surgeon:  Isaias Cowman, MD;  Location: [place] [facility];  Service: Cardiovascular;  Laterality: Bilateral;   CATARACT EXTRACTION W/PHACO Left 03/20/2017   Procedure: IOL EXCHANGE;  Surgeon: Leandrew Koyanagi, MD;  Location: [place];  Service: Ophthalmology;  Laterality: Left;   CHOLECYSTECTOMY     DILATION AND CURETTAGE OF UTERUS     ESOPHAGOGASTRODUODENOSCOPY     PACEMAKER INSERTION     PERIPHERAL IRIDOTOMY Left 03/20/2017   Procedure: PERIPHERAL IRIDECTOMY;  Surgeon: Leandrew Koyanagi, MD;  Location: [place];  Service: Ophthalmology;  Laterality: Left;   ROTATOR CUFF REPAIR Left    TEE WITHOUT CARDIOVERSION N/A 05/16/2016   Procedure: TRANSESOPHAGEAL ECHOCARDIOGRAM (TEE);  Surgeon: Teodoro Spray, MD;  Location: [facility];  Service: Cardiovascular;  Laterality: N/A;   TONSILLECTOMY     uterine polyp      There were no vitals filed for this visit.   Subjective Assessment - 12/05/20 1149     Subjective Pt states that she is doing well today. She denies any resting pain upon arrival today. She had one stumble while out on the golf [place] but otherwise no falls. No specific questions or concerns currently.    Pertinent History Pts main complaint is her balance. Has a fear of  falling but has not actually fallen. Stated she will tend to lean or start falling backwards, and reports some staggering in her gait path as well. Also reported she would like to work on her leg strength to make it easier to transfer like off/on the commode, out of chairs. Patient has been in therapy previously for vertigo as well as general conditioning. Has had issue with her balance for less than 5 years. Feels her balance has worsened in the last few weeks. Pt does have chronic R knee pain and R shoulder pain. Pt also reported chronic DOE, that she feels has worsened lately as well. No AD use in her home, but will use her [facility] or rollator for community ambulation. Pt also mentioned some neck  pain and potential radicular symptoms to RUE as well as chronic R rotator cuff tear.   PMH of afib, temporal arteritis, pacemaker (sees a cardiologist regularly), HTN, meningioma, CKDIII, NSTEMI, chronic headaches, breast cancer, TIA about 4 years ago.    How long can you sit comfortably? NA    How long can you stand comfortably? patient reported her R knee limits her ability to stand for long periods    How long can you walk comfortably? Pt feels winded by shorter distances now, for example from parking lot to store. Has to stop and catch her breath    Patient Stated Goals improve balance,    Currently in Pain? No/denies                 TREATMENT      Neuromuscular Re-education  All balance exercises performed in // bars without UE support: Airex balloon taps with therapist in both wide and narrow stance x 3 minutes; Airex alternating 6" step taps x 10 BLE; Tandem balance alternating forward LE 2 x 30s each;     Ther-ex  NuStep L2-4 x 5 minutes for warm-up during history, therapist monitoring fatigue;   Standing exercises with 4# ankle weights: Hip abduction x 60s Hamstrings curls x 6s;   Standing mini squats with BUE support x 15; Seated LAQ with 4# ankle weights 2 x 1 minute; Seated marches with 4# ankle weights 2 x 1 minute; Seated clams with green tband 2 x 1 minute Seated adductor ball squeeze 2 x 1 minute;     Pt educated throughout session about proper posture and technique with exercises. Improved exercise technique, movement at target joints, use of target muscles after min to mod verbal, visual, tactile cues.      Patient demonstrates excellent motivation during session today. Continued with LE strengthening today with intermittent seated rest break. Challenged balance today on Airex pad and she struggles in narrow stance as well as with single leg balance. Overall she making excellent progress towards her goals to improve balance and decrease fall risk. Patient  encouraged to continue current HEP and follow-up as scheduled.  She will benefit from PT services to address deficits in strength, balance, and mobility in order to return to full function at home.                         PT Short Term Goals - 11/04/20 1109       PT SHORT TERM GOAL #1   Title Pt will be independent with initial HEP in order to improve strength and balance in order to decrease fall risk and improve function at home    Baseline to be administered next session  Time 4    Period Weeks    Status On-going    Target Date 12/02/20               PT Long Term Goals - 11/04/20 1109       PT LONG TERM GOAL #1   Title Pt will be independent with finalized HEP in order to improve strength and balance in order to decrease fall risk and improve function at home    Time 8    Period Weeks    Status On-going    Target Date 12/30/20      PT LONG TERM GOAL #2   Title Pt will improve BERG to at least 50/56 in order to demonstrate clinically significant improvement in balance.    Baseline 42/56 on eval 3/18; 10/28/20: 47/56    Time 8    Period Weeks    Status Partially Met    Target Date 12/30/20      PT LONG TERM GOAL #3   Title Pt will decrease 5TSTS by at least 3 seconds in order to demonstrate clinically significant improvement in LE strength.    Baseline 35seconds with BUE support on eval 3/18; 10/28/20: 19.5s with BUE support    Time 8    Period Weeks    Status Partially Met    Target Date 12/30/20      PT LONG TERM GOAL #4   Title Pt will improve DGI by at least 3 points in order to demonstrate clinically significant improvement in balance and decreased risk for falls.    Baseline 09/12/20: 17/24, 10/28/20: 18/24    Time 8    Period Weeks    Status Partially Met    Target Date 12/30/20      PT LONG TERM GOAL #5   Title Patient will demonstrate at 12 point improvement in her FOTO score indicating improved function and safety with functional  activities.    Baseline 3/18 eval 43; 10/28/20: 53    Time 8    Period Weeks    Status Partially Met    Target Date 12/30/20      PT LONG TERM GOAL #6   Title The patient will demonstrate at least 1/2 grade improvement in LLE MMT indicating improved strength and ability to perform functional activities such as transfers, ambulation, stairs, curbs, etc    Baseline on eval 3/18 grossly 3+/5 for hip strength; 10/28/20: Partially met, see visit note    Time 8    Period Weeks    Status Partially Met    Target Date 12/30/20                   Plan - 12/05/20 1152     Clinical Impression Statement Patient demonstrates excellent motivation during session today. Continued with LE strengthening today with intermittent seated rest break. Challenged balance today on Airex pad and she struggles in narrow stance as well as with single leg balance. Overall she making excellent progress towards her goals to improve balance and decrease fall risk. Patient encouraged to continue current HEP and follow-up as scheduled.  She will benefit from PT services to address deficits in strength, balance, and mobility in order to return to full function at home.    Personal Factors and Comorbidities Age;Comorbidity 3+;Fitness    Comorbidities afib, temporal arteritis, pacemaker (sees a cardiologist regularly), HTN, meningioma, CKDIII, NSTEMI, chronic headaches, breast cancer, TIA about 4 years ago.    Examination-Activity Limitations Transfers;Bed Mobility;Locomotion Level;Stairs;Stand;Reach Overhead;Squat;Lift;Bend  Examination-Participation Restrictions Church;Laundry;Cleaning;Shop;Community Activity;Volunteer;Meal Prep;Interpersonal Relationship    Stability/Clinical Decision Making Stable/Uncomplicated    Rehab Potential Good    PT Frequency 2x / week    PT Duration 8 weeks    PT Treatment/Interventions Canalith Repostioning;ADLs/Self Care Home Management;Gait training;Stair training;Therapeutic  activities;Therapeutic exercise;Balance training;Neuromuscular re-education;Patient/family education;Vestibular;Cryotherapy;Moist Heat;Electrical Stimulation;Splinting;Taping;Joint Manipulations;Spinal Manipulations;Dry needling;Energy conservation;Passive range of motion;Aquatic Therapy    PT Next Visit Plan balance and strength interventions    PT Home Exercise Plan Access Code: [email or web address]    Consulted and Agree with Plan of Care Patient             Patient will benefit from skilled therapeutic intervention in order to improve the following deficits and impairments:  Abnormal gait, Decreased balance, Decreased endurance, Difficulty walking, Cardiopulmonary status limiting activity, Decreased activity tolerance, Decreased strength  Visit Diagnosis: Difficulty in walking, not elsewhere classified  Muscle weakness (generalized)     Problem List Patient Active Problem List   Diagnosis Date Noted   Swelling of limb 11/01/2020   Intracranial carotid stenosis, right 05/29/2019   Chronic headaches 05/29/2019   Long term current use of systemic steroids 05/04/2019   Screening for osteoporosis 05/04/2019   Temporal arteritis ([place]) 04/07/2019   Meningioma ([place]) 12/29/2018   Chronic daily headache 11/20/2018   Elevated erythrocyte sedimentation rate 11/05/2018   CKD (chronic kidney disease) stage 3, GFR 30-59 ml/min (H[facility]) 12/27/2017   Shingles 05/06/2017   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 04/12/2017   Primary osteoarthritis of right shoulder 12/07/2016   Rotator cuff tear, right 12/07/2016   Rotator cuff tendinitis, right 12/07/2016   CHB (complete heart block) ([place]) 06/30/2016   Severe mitral regurgitation 05/29/2016   Pedal edema 04/23/2016   Lung nodule, multiple 12/28/2015   Amnesia, global, transient 04/05/2015   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 06/30/2014   Cardiac pacemaker 05/18/2014   Sleep disorder 05/06/2014    Diffuse pain 04/06/2014   Fatigue 03/16/2014   Headache 03/16/2014   History of melanoma 11/24/2013   Melanoma ([place]) 11/24/2013   DCIS (ductal carcinoma in situ) 08/11/2013   History of ductal carcinoma in situ (DCIS) of breast 08/11/2013   H/O non-ST elevation myocardial infarction (NSTEMI) 01/01/2013   Hearing impairment 01/01/2013   S/P ablation of atrial fibrillation 07/11/2012   Edema 01/23/2012   Hypertension 01/23/2012   Atrial fibrillation ([place]) 11/27/2011   Coronary artery disease 11/27/2011   Lyndel Safe Aryanna Shaver PT, DPT, GCS  Reilly Molchan 12/05/2020, 4:26 PM  [facility] [address] [place], [place], [address] Phone: [phone]   Fax:  [phone]  Name: Charlotte Glover MRN: [id number] Date of Birth: March 20, 1928

## 2020-12-09 ENCOUNTER — Other Ambulatory Visit: Payer: Self-pay

## 2020-12-09 ENCOUNTER — Ambulatory Visit: Payer: Medicare Other

## 2020-12-09 DIAGNOSIS — R262 Difficulty in walking, not elsewhere classified: Secondary | ICD-10-CM

## 2020-12-09 DIAGNOSIS — M6281 Muscle weakness (generalized): Secondary | ICD-10-CM

## 2020-12-09 DIAGNOSIS — R2689 Other abnormalities of gait and mobility: Secondary | ICD-10-CM

## 2020-12-09 NOTE — Therapy (Signed)
[facility] [address] [place], [place], [address] Phone: [phone]   Fax:  [phone]  Physical Therapy Treatment  Patient Details  Name: Charlotte Glover MRN: [id number] Date of Birth: 04-Jan-1928 No data recorded  Encounter Date: 12/09/2020   PT End of Session - 12/09/20 1229     Visit Number 16    Number of Visits 33    Date for PT Re-Evaluation 12/16/20    Authorization Type medicare    PT Start Time 6116    PT Stop Time 1215    PT Time Calculation (min) 30 min    Equipment Utilized During Treatment Gait belt    Activity Tolerance Patient tolerated treatment well    Behavior During Therapy WFL for tasks assessed/performed             Past Medical History:  Diagnosis Date   Arthritis    Atrial fibrillation ([place])    Breast cancer ([place]) 2015   DCIS   Cancer ([place])    Cardiac arrest ([place])    CHF (congestive heart failure) ([place])    Chickenpox    Coronary artery disease    Dyspnea    Dysrhythmia    Edema    GERD (gastroesophageal reflux disease)    Hearing aid worn    bilateral   History of MI (myocardial infarction)    HOH (hard of hearing)    Hypertension 01/23/2012   Myocardial infarction ([place]) 01/01/2013   S/P ablation of atrial fibrillation    Shingles     Past Surgical History:  Procedure Laterality Date   ANTERIOR VITRECTOMY Left 03/20/2017   Procedure: ANTERIOR VITRECTOMY;  Surgeon: Leandrew Koyanagi, MD;  Location: [place];  Service: Ophthalmology;  Laterality: Left;  IVA TOPICAL LEFT   ARTERY BIOPSY Right 04/08/2019   Procedure: BIOPSY TEMPORAL ARTERY;  Surgeon: Algernon Huxley, MD;  Location: [facility];  Service: Vascular;  Laterality: Right;   ATRIAL ABLATION SURGERY     BREAST BIOPSY Right 2015   + for DCIS    BREAST SURGERY     CARDIAC CATHETERIZATION     CARDIAC CATHETERIZATION Bilateral 05/08/2016   Procedure: Right/Left Heart Cath and Coronary Angiography;  Surgeon:  Isaias Cowman, MD;  Location: [facility];  Service: Cardiovascular;  Laterality: Bilateral;   CATARACT EXTRACTION W/PHACO Left 03/20/2017   Procedure: IOL EXCHANGE;  Surgeon: Leandrew Koyanagi, MD;  Location: [place];  Service: Ophthalmology;  Laterality: Left;   CHOLECYSTECTOMY     DILATION AND CURETTAGE OF UTERUS     ESOPHAGOGASTRODUODENOSCOPY     PACEMAKER INSERTION     PERIPHERAL IRIDOTOMY Left 03/20/2017   Procedure: PERIPHERAL IRIDECTOMY;  Surgeon: Leandrew Koyanagi, MD;  Location: [place];  Service: Ophthalmology;  Laterality: Left;   ROTATOR CUFF REPAIR Left    TEE WITHOUT CARDIOVERSION N/A 05/16/2016   Procedure: TRANSESOPHAGEAL ECHOCARDIOGRAM (TEE);  Surgeon: Teodoro Spray, MD;  Location: [facility];  Service: Cardiovascular;  Laterality: N/A;   TONSILLECTOMY     uterine polyp      There were no vitals filed for this visit.   Subjective Assessment - 12/09/20 1233     Subjective Pt states that she is doing well today. She denies any resting pain upon arrival today. She states that her knee became painful on Monday evening and she has been attempting to wear a knee brace since then however the brace is uncomfortable. No specific questions or concerns currently.  Pertinent History Pts main complaint is her balance. Has a fear of falling but has not actually fallen. Stated she will tend to lean or start falling backwards, and reports some staggering in her gait path as well. Also reported she would like to work on her leg strength to make it easier to transfer like off/on the commode, out of chairs. Patient has been in therapy previously for vertigo as well as general conditioning. Has had issue with her balance for less than 5 years. Feels her balance has worsened in the last few weeks. Pt does have chronic R knee pain and R shoulder pain. Pt also reported chronic DOE, that she feels has worsened lately as well. No AD use in her home, but will  use her [facility] or rollator for community ambulation. Pt also mentioned some neck pain and potential radicular symptoms to RUE as well as chronic R rotator cuff tear.   PMH of afib, temporal arteritis, pacemaker (sees a cardiologist regularly), HTN, meningioma, CKDIII, NSTEMI, chronic headaches, breast cancer, TIA about 4 years ago.    How long can you sit comfortably? NA    How long can you stand comfortably? patient reported her R knee limits her ability to stand for long periods    How long can you walk comfortably? Pt feels winded by shorter distances now, for example from parking lot to store. Has to stop and catch her breath    Patient Stated Goals improve balance,    Currently in Pain? No/denies              TREATMENT     Ther-ex  NuStep L2-3 x 3 minutes for warm-up during history, therapist monitoring fatigue (time limited by R knee pain);  Seated LAQ with 4# ankle weights 2 x 1 minute; Seated marches 1 min w/o weight and 1 min with 4# ankle weights;  Seated clams with green tband 2 x 1 minute Seated adductor ball squeeze 2 x 1 minute;     Pt educated throughout session about proper posture and technique with exercises. Improved exercise technique, movement at target joints, use of target muscles after min to mod verbal, visual, tactile cues.      Patient demonstrates excellent motivation during session today with some frustration due to R knee pain and L hip weakness. Pt ended session early due to knee pain. Continued with LE strengthening today with intermittent seated rest break. Overall she making progress towards her goals to improve balance and decrease fall risk. Patient encouraged to continue current HEP and follow-up as scheduled.  She will benefit from PT services to address deficits in strength, balance, and mobility in order to return to full function at home.         PT Short Term Goals - 11/04/20 1109       PT SHORT TERM GOAL #1   Title Pt will be independent  with initial HEP in order to improve strength and balance in order to decrease fall risk and improve function at home    Baseline to be administered next session    Time 4    Period Weeks    Status On-going    Target Date 12/02/20               PT Long Term Goals - 11/04/20 1109       PT LONG TERM GOAL #1   Title Pt will be independent with finalized HEP in order to improve strength and balance in order to  decrease fall risk and improve function at home    Time 8    Period Weeks    Status On-going    Target Date 12/30/20      PT LONG TERM GOAL #2   Title Pt will improve BERG to at least 50/56 in order to demonstrate clinically significant improvement in balance.    Baseline 42/56 on eval 3/18; 10/28/20: 47/56    Time 8    Period Weeks    Status Partially Met    Target Date 12/30/20      PT LONG TERM GOAL #3   Title Pt will decrease 5TSTS by at least 3 seconds in order to demonstrate clinically significant improvement in LE strength.    Baseline 35seconds with BUE support on eval 3/18; 10/28/20: 19.5s with BUE support    Time 8    Period Weeks    Status Partially Met    Target Date 12/30/20      PT LONG TERM GOAL #4   Title Pt will improve DGI by at least 3 points in order to demonstrate clinically significant improvement in balance and decreased risk for falls.    Baseline 09/12/20: 17/24, 10/28/20: 18/24    Time 8    Period Weeks    Status Partially Met    Target Date 12/30/20      PT LONG TERM GOAL #5   Title Patient will demonstrate at 12 point improvement in her FOTO score indicating improved function and safety with functional activities.    Baseline 3/18 eval 43; 10/28/20: 53    Time 8    Period Weeks    Status Partially Met    Target Date 12/30/20      PT LONG TERM GOAL #6   Title The patient will demonstrate at least 1/2 grade improvement in LLE MMT indicating improved strength and ability to perform functional activities such as transfers, ambulation, stairs,  curbs, etc    Baseline on eval 3/18 grossly 3+/5 for hip strength; 10/28/20: Partially met, see visit note    Time 8    Period Weeks    Status Partially Met    Target Date 12/30/20                   Plan - 12/09/20 1222     Clinical Impression Statement Patient demonstrates excellent motivation during session today with some frustration due to R knee pain and L hip weakness. Pt ended session early due to knee pain. Continued with LE strengthening today with intermittent seated rest break. Overall she making progress towards her goals to improve balance and decrease fall risk. Patient encouraged to continue current HEP and follow-up as scheduled.  She will benefit from PT services to address deficits in strength, balance, and mobility in order to return to full function at home.    Personal Factors and Comorbidities Age;Comorbidity 3+;Fitness    Comorbidities afib, temporal arteritis, pacemaker (sees a cardiologist regularly), HTN, meningioma, CKDIII, NSTEMI, chronic headaches, breast cancer, TIA about 4 years ago.    Examination-Activity Limitations Transfers;Bed Mobility;Locomotion Level;Stairs;Stand;Reach Overhead;Squat;Lift;Bend    Examination-Participation Restrictions Church;Laundry;Cleaning;Shop;Community Activity;Volunteer;Meal Prep;Interpersonal Relationship    Stability/Clinical Decision Making Stable/Uncomplicated    Rehab Potential Good    PT Frequency 2x / week    PT Duration 8 weeks    PT Treatment/Interventions Canalith Repostioning;ADLs/Self Care Home Management;Gait training;Stair training;Therapeutic activities;Therapeutic exercise;Balance training;Neuromuscular re-education;Patient/family education;Vestibular;Cryotherapy;Moist Heat;Electrical Stimulation;Splinting;Taping;Joint Manipulations;Spinal Manipulations;Dry needling;Energy conservation;Passive range of motion;Aquatic Therapy    PT Next Visit Plan  balance and strength interventions    PT Home Exercise Plan Access  Code: [email or web address]    Consulted and Agree with Plan of Care Patient             Patient will benefit from skilled therapeutic intervention in order to improve the following deficits and impairments:  Abnormal gait, Decreased balance, Decreased endurance, Difficulty walking, Cardiopulmonary status limiting activity, Decreased activity tolerance, Decreased strength  Visit Diagnosis: Difficulty in walking, not elsewhere classified  Muscle weakness (generalized)  Balance problem     Problem List Patient Active Problem List   Diagnosis Date Noted   Swelling of limb 11/01/2020   Intracranial carotid stenosis, right 05/29/2019   Chronic headaches 05/29/2019   Long term current use of systemic steroids 05/04/2019   Screening for osteoporosis 05/04/2019   Temporal arteritis ([place]) 04/07/2019   Meningioma ([place]) 12/29/2018   Chronic daily headache 11/20/2018   Elevated erythrocyte sedimentation rate 11/05/2018   CKD (chronic kidney disease) stage 3, GFR 30-59 ml/min (HCC) 12/27/2017   Shingles 05/06/2017   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 04/12/2017   Primary osteoarthritis of right shoulder 12/07/2016   Rotator cuff tear, right 12/07/2016   Rotator cuff tendinitis, right 12/07/2016   CHB (complete heart block) ([place]) 06/30/2016   Severe mitral regurgitation 05/29/2016   Pedal edema 04/23/2016   Lung nodule, multiple 12/28/2015   Amnesia, global, transient 04/05/2015   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 06/30/2014   Cardiac pacemaker 05/18/2014   Sleep disorder 05/06/2014   Diffuse pain 04/06/2014   Fatigue 03/16/2014   Headache 03/16/2014   History of melanoma 11/24/2013   Melanoma ([place]) 11/24/2013   DCIS (ductal carcinoma in situ) 08/11/2013   History of ductal carcinoma in situ (DCIS) of breast 08/11/2013   H/O non-ST elevation myocardial infarction (NSTEMI) 01/01/2013   Hearing impairment 01/01/2013   S/P  ablation of atrial fibrillation 07/11/2012   Edema 01/23/2012   Hypertension 01/23/2012   Atrial fibrillation ([place]) 11/27/2011   Coronary artery disease 11/27/2011   Patrina Levering PT, DPT  [facility] 12/09/2020, 12:40 PM  [facility] [address] [place], [place], [address] Phone: [phone]   Fax:  [phone]  Name: Daziya Redmond MRN: [id number] Date of Birth: 01-26-1928

## 2020-12-12 ENCOUNTER — Other Ambulatory Visit: Payer: Self-pay

## 2020-12-12 ENCOUNTER — Ambulatory Visit: Payer: Medicare Other

## 2020-12-12 ENCOUNTER — Encounter: Payer: Self-pay | Admitting: *Deleted

## 2020-12-12 DIAGNOSIS — R262 Difficulty in walking, not elsewhere classified: Secondary | ICD-10-CM | POA: Diagnosis not present

## 2020-12-12 DIAGNOSIS — M6281 Muscle weakness (generalized): Secondary | ICD-10-CM

## 2020-12-12 NOTE — Therapy (Signed)
[facility] [address]. [place], [place], [address] Phone: [phone]   Fax:  [phone]  Physical Therapy Treatment  Patient Details  Name: Charlotte Glover MRN: [id number] Date of Birth: 10-Oct-1927 No data recorded  Encounter Date: 12/12/2020   PT End of Session - 12/12/20 1325     Visit Number 17    Number of Visits 33    Date for PT Re-Evaluation 12/16/20    Authorization Type medicare    PT Start Time 1315    PT Stop Time 1400    PT Time Calculation (min) 45 min    Equipment Utilized During Treatment Gait belt    Activity Tolerance Patient tolerated treatment well    Behavior During Therapy WFL for tasks assessed/performed             Past Medical History:  Diagnosis Date   Arthritis    Atrial fibrillation ([place])    Breast cancer ([place]) 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   Cancer ([place])    Cardiac arrest ([place])    CHF (congestive heart failure) ([place])    Chickenpox    Chronic kidney disease    Coronary artery disease    Dyspnea    Dysrhythmia    Edema    GERD (gastroesophageal reflux disease)    Hearing aid worn    bilateral   History of MI (myocardial infarction)    HOH (hard of hearing)    Hypertension 01/23/2012   Myocardial infarction ([place]) 01/01/2013   S/P ablation of atrial fibrillation    Shingles     Past Surgical History:  Procedure Laterality Date   ANTERIOR VITRECTOMY Left 03/20/2017   Procedure: ANTERIOR VITRECTOMY;  Surgeon: Leandrew Koyanagi, MD;  Location: [place];  Service: Ophthalmology;  Laterality: Left;  IVA TOPICAL LEFT   ARTERY BIOPSY Right 04/08/2019   Procedure: BIOPSY TEMPORAL ARTERY;  Surgeon: Algernon Huxley, MD;  Location: [facility];  Service: Vascular;  Laterality: Right;   ATRIAL ABLATION SURGERY     BREAST BIOPSY Right 2015   + for DCIS    BREAST SURGERY     CARDIAC CATHETERIZATION     CARDIAC CATHETERIZATION Bilateral 05/08/2016   Procedure: Right/Left Heart Cath and  Coronary Angiography;  Surgeon: Isaias Cowman, MD;  Location: [place] [facility];  Service: Cardiovascular;  Laterality: Bilateral;   CATARACT EXTRACTION W/PHACO Left 03/20/2017   Procedure: IOL EXCHANGE;  Surgeon: Leandrew Koyanagi, MD;  Location: [place];  Service: Ophthalmology;  Laterality: Left;   CHOLECYSTECTOMY     DILATION AND CURETTAGE OF UTERUS     ESOPHAGOGASTRODUODENOSCOPY     PACEMAKER INSERTION     PERIPHERAL IRIDOTOMY Left 03/20/2017   Procedure: PERIPHERAL IRIDECTOMY;  Surgeon: Leandrew Koyanagi, MD;  Location: [place];  Service: Ophthalmology;  Laterality: Left;   ROTATOR CUFF REPAIR Left    TEE WITHOUT CARDIOVERSION N/A 05/16/2016   Procedure: TRANSESOPHAGEAL ECHOCARDIOGRAM (TEE);  Surgeon: Teodoro Spray, MD;  Location: [facility];  Service: Cardiovascular;  Laterality: N/A;   TONSILLECTOMY     uterine polyp      There were no vitals filed for this visit.   Subjective Assessment - 12/12/20 1321     Subjective Pt states that she is doing alright today. She states that her R knee continues to bother her. She states that she has had a couple epsiodes where she has stumbled to the side and her head is feeling swirly. No specific questions or concerns  currently.    Pertinent History Pts main complaint is her balance. Has a fear of falling but has not actually fallen. Stated she will tend to lean or start falling backwards, and reports some staggering in her gait path as well. Also reported she would like to work on her leg strength to make it easier to transfer like off/on the commode, out of chairs. Patient has been in therapy previously for vertigo as well as general conditioning. Has had issue with her balance for less than 5 years. Feels her balance has worsened in the last few weeks. Pt does have chronic R knee pain and R shoulder pain. Pt also reported chronic DOE, that she feels has worsened lately as well. No AD use in her home, but will  use her [facility] or rollator for community ambulation. Pt also mentioned some neck pain and potential radicular symptoms to RUE as well as chronic R rotator cuff tear.   PMH of afib, temporal arteritis, pacemaker (sees a cardiologist regularly), HTN, meningioma, CKDIII, NSTEMI, chronic headaches, breast cancer, TIA about 4 years ago.    How long can you sit comfortably? NA    How long can you stand comfortably? patient reported her R knee limits her ability to stand for long periods    How long can you walk comfortably? Pt feels winded by shorter distances now, for example from parking lot to store. Has to stop and catch her breath    Patient Stated Goals improve balance,    Currently in Pain? Yes    Pain Score 3     Pain Location Knee    Pain Orientation Right    Pain Descriptors / Indicators Burning    Pain Type Chronic pain    Pain Onset More than a month ago    Pain Frequency Intermittent                TREATMENT      Neuromuscular Re-education  All balance exercises performed in // bars without UE support:  Ambulation on grass forward, backward, R lateral, and L lateral with 3# ankle weights  Gait in hallway with horizontal and vertical head turns x 75'; Dix-Hallpike and roll tests performed which are negative bilaterally;     Ther-ex  NuStep L0-2 x 6:50 minutes for warm-up during history, therapist monitoring fatigue and adjust   Standing exercises with 4# ankle weights: Hip abduction x 60s Hamstrings curls x 60s;   Standing mini squats with BUE support x 15; Seated LAQ with 4# ankle weights 2 x 30 seconds; Seated marches with 4# ankle weights 2 x 30 seconds;     Pt educated throughout session about proper posture and technique with exercises. Improved exercise technique, movement at target joints, use of target muscles after min to mod verbal, visual, tactile cues.      Patient demonstrates excellent motivation during session today. Continued with LE strengthening  today with intermittent seated rest break. Challenged balance today by performing gait outside across grass as well as performing horizontal and vertical during gait in hallway. Pt reports his head feeling "swirly" again so repeated canal testing which is negative bilaterally for BPPV.  Overall she making excellent progress towards her goals to improve balance and decrease fall risk although she is frustrated today by her occasional stumbling during gait. Patient encouraged to continue current HEP and follow-up as scheduled.  She will benefit from PT services to address deficits in strength, balance, and mobility in order to return to  full function at home.                              PT Short Term Goals - 11/04/20 1109       PT SHORT TERM GOAL #1   Title Pt will be independent with initial HEP in order to improve strength and balance in order to decrease fall risk and improve function at home    Baseline to be administered next session    Time 4    Period Weeks    Status On-going    Target Date 12/02/20               PT Long Term Goals - 11/04/20 1109       PT LONG TERM GOAL #1   Title Pt will be independent with finalized HEP in order to improve strength and balance in order to decrease fall risk and improve function at home    Time 8    Period Weeks    Status On-going    Target Date 12/30/20      PT LONG TERM GOAL #2   Title Pt will improve BERG to at least 50/56 in order to demonstrate clinically significant improvement in balance.    Baseline 42/56 on eval 3/18; 10/28/20: 47/56    Time 8    Period Weeks    Status Partially Met    Target Date 12/30/20      PT LONG TERM GOAL #3   Title Pt will decrease 5TSTS by at least 3 seconds in order to demonstrate clinically significant improvement in LE strength.    Baseline 35seconds with BUE support on eval 3/18; 10/28/20: 19.5s with BUE support    Time 8    Period Weeks    Status Partially Met    Target  Date 12/30/20      PT LONG TERM GOAL #4   Title Pt will improve DGI by at least 3 points in order to demonstrate clinically significant improvement in balance and decreased risk for falls.    Baseline 09/12/20: 17/24, 10/28/20: 18/24    Time 8    Period Weeks    Status Partially Met    Target Date 12/30/20      PT LONG TERM GOAL #5   Title Patient will demonstrate at 12 point improvement in her FOTO score indicating improved function and safety with functional activities.    Baseline 3/18 eval 43; 10/28/20: 53    Time 8    Period Weeks    Status Partially Met    Target Date 12/30/20      PT LONG TERM GOAL #6   Title The patient will demonstrate at least 1/2 grade improvement in LLE MMT indicating improved strength and ability to perform functional activities such as transfers, ambulation, stairs, curbs, etc    Baseline on eval 3/18 grossly 3+/5 for hip strength; 10/28/20: Partially met, see visit note    Time 8    Period Weeks    Status Partially Met    Target Date 12/30/20                   Plan - 12/12/20 1326     Clinical Impression Statement Patient demonstrates excellent motivation during session today. Continued with LE strengthening today with intermittent seated rest break. Challenged balance today by performing gait outside across grass as well as performing horizontal and vertical during gait in hallway. Pt  reports his head feeling "swirly" again so repeated canal testing which is negative bilaterally for BPPV.  Overall she making excellent progress towards her goals to improve balance and decrease fall risk although she is frustrated today by her occasional stumbling during gait. Patient encouraged to continue current HEP and follow-up as scheduled.  She will benefit from PT services to address deficits in strength, balance, and mobility in order to return to full function at home.    Personal Factors and Comorbidities Age;Comorbidity 3+;Fitness    Comorbidities afib,  temporal arteritis, pacemaker (sees a cardiologist regularly), HTN, meningioma, CKDIII, NSTEMI, chronic headaches, breast cancer, TIA about 4 years ago.    Examination-Activity Limitations Transfers;Bed Mobility;Locomotion Level;Stairs;Stand;Reach Overhead;Squat;Lift;Bend    Examination-Participation Restrictions Church;Laundry;Cleaning;Shop;Community Activity;Volunteer;Meal Prep;Interpersonal Relationship    Stability/Clinical Decision Making Stable/Uncomplicated    Rehab Potential Good    PT Frequency 2x / week    PT Duration 8 weeks    PT Treatment/Interventions Canalith Repostioning;ADLs/Self Care Home Management;Gait training;Stair training;Therapeutic activities;Therapeutic exercise;Balance training;Neuromuscular re-education;Patient/family education;Vestibular;Cryotherapy;Moist Heat;Electrical Stimulation;Splinting;Taping;Joint Manipulations;Spinal Manipulations;Dry needling;Energy conservation;Passive range of motion;Aquatic Therapy    PT Next Visit Plan balance and strength interventions    PT Home Exercise Plan Access Code: [email or web address]    Consulted and Agree with Plan of Care Patient             Patient will benefit from skilled therapeutic intervention in order to improve the following deficits and impairments:  Abnormal gait, Decreased balance, Decreased endurance, Difficulty walking, Cardiopulmonary status limiting activity, Decreased activity tolerance, Decreased strength  Visit Diagnosis: Difficulty in walking, not elsewhere classified  Muscle weakness (generalized)     Problem List Patient Active Problem List   Diagnosis Date Noted   Swelling of limb 11/01/2020   Intracranial carotid stenosis, right 05/29/2019   Chronic headaches 05/29/2019   Long term current use of systemic steroids 05/04/2019   Screening for osteoporosis 05/04/2019   Temporal arteritis ([place]) 04/07/2019   Meningioma ([place]) 12/29/2018   Chronic daily headache 11/20/2018   Elevated erythrocyte  sedimentation rate 11/05/2018   CKD (chronic kidney disease) stage 3, GFR 30-59 ml/min (HCC) 12/27/2017   Shingles 05/06/2017   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 06/30/2014   Cardiac pacemaker 05/18/2014   Sleep disorder 05/06/2014   Diffuse pain 04/06/2014   Fatigue 03/16/2014   Headache 03/16/2014   History of melanoma 11/24/2013   Melanoma ([place]) 11/24/2013   DCIS (ductal carcinoma in situ) 08/11/2013   History of ductal carcinoma in situ (DCIS) of breast 08/11/2013   H/O non-ST elevation myocardial infarction (NSTEMI) 01/01/2013   Hearing impairment 01/01/2013   S/P ablation of atrial fibrillation 07/11/2012   Edema 01/23/2012   Hypertension 01/23/2012   Atrial fibrillation ([place]) 11/27/2011   Coronary artery disease 11/27/2011   Lyndel Safe Hudsen Fei PT, DPT, GCS  Averlee Swartz 12/12/2020, 2:32 PM   [facility] [address] [place], [place], [address] Phone: [phone]   Fax:  [phone]  Name: Charlotte Glover MRN: [id number] Date of Birth: 1927/09/11

## 2020-12-13 ENCOUNTER — Ambulatory Visit: Payer: Medicare Other | Admitting: Anesthesiology

## 2020-12-13 ENCOUNTER — Encounter
Admission: RE | Disposition: A | Discharge status: Home / Self Care | Payer: Self-pay | Source: Home / Self Care | Attending: Gastroenterology

## 2020-12-13 ENCOUNTER — Ambulatory Visit
Admission: RE | Admit: 2020-12-13 | Discharge: 2020-12-13 | Disposition: A | Discharge status: Home / Self Care | Payer: Medicare Other | Attending: Gastroenterology | Admitting: Gastroenterology

## 2020-12-13 ENCOUNTER — Encounter: Payer: Self-pay | Admitting: Anesthesiology

## 2020-12-13 DIAGNOSIS — Q399 Congenital malformation of esophagus, unspecified: Secondary | ICD-10-CM | POA: Insufficient documentation

## 2020-12-13 DIAGNOSIS — K317 Polyp of stomach and duodenum: Secondary | ICD-10-CM | POA: Insufficient documentation

## 2020-12-13 DIAGNOSIS — Z7952 Long term (current) use of systemic steroids: Secondary | ICD-10-CM | POA: Insufficient documentation

## 2020-12-13 DIAGNOSIS — N189 Chronic kidney disease, unspecified: Secondary | ICD-10-CM | POA: Diagnosis not present

## 2020-12-13 DIAGNOSIS — I509 Heart failure, unspecified: Secondary | ICD-10-CM | POA: Insufficient documentation

## 2020-12-13 DIAGNOSIS — I130 Hypertensive heart and chronic kidney disease with heart failure and stage 1 through stage 4 chronic kidney disease, or unspecified chronic kidney disease: Secondary | ICD-10-CM | POA: Insufficient documentation

## 2020-12-13 DIAGNOSIS — K219 Gastro-esophageal reflux disease without esophagitis: Secondary | ICD-10-CM | POA: Insufficient documentation

## 2020-12-13 DIAGNOSIS — K449 Diaphragmatic hernia without obstruction or gangrene: Secondary | ICD-10-CM | POA: Diagnosis not present

## 2020-12-13 DIAGNOSIS — Z79899 Other long term (current) drug therapy: Secondary | ICD-10-CM | POA: Insufficient documentation

## 2020-12-13 DIAGNOSIS — R1310 Dysphagia, unspecified: Secondary | ICD-10-CM | POA: Insufficient documentation

## 2020-12-13 DIAGNOSIS — Z888 Allergy status to other drugs, medicaments and biological substances status: Secondary | ICD-10-CM | POA: Insufficient documentation

## 2020-12-13 DIAGNOSIS — Z7901 Long term (current) use of anticoagulants: Secondary | ICD-10-CM | POA: Diagnosis not present

## 2020-12-13 HISTORY — PX: ESOPHAGOGASTRODUODENOSCOPY (EGD) WITH PROPOFOL: SHX5813

## 2020-12-13 HISTORY — DX: Chronic kidney disease, unspecified: N18.9

## 2020-12-13 SURGERY — ESOPHAGOGASTRODUODENOSCOPY (EGD) WITH PROPOFOL
Anesthesia: General

## 2020-12-13 MED ORDER — PROPOFOL 500 MG/50ML IV EMUL
INTRAVENOUS | Status: AC
  Filled 2020-12-13: qty 50

## 2020-12-13 MED ORDER — SODIUM CHLORIDE 0.9 % IV SOLN
INTRAVENOUS | Status: DC
  Administered 2020-12-13: 20 mL/h via INTRAVENOUS

## 2020-12-13 MED ORDER — PROPOFOL 500 MG/50ML IV EMUL
INTRAVENOUS | Status: DC | PRN
  Administered 2020-12-13: 50 ug/kg/min via INTRAVENOUS

## 2020-12-13 NOTE — H&P (Signed)
Outpatient short stay form Pre-procedure 12/13/2020 9:25 AM Raylene Miyamoto MD, MPH  Primary Physician: Dr. 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 Takes xarelto but last dose was 4 days ago. Has had botox before but not much improvement.    Current Facility-Administered Medications:    0.9 %  sodium chloride infusion, , Intravenous, Continuous, Barbarita Hutmacher, Hilton Cork, MD, Last Rate: 20 mL/hr at 12/13/20 0919, 20 mL/hr at 12/13/20 0919  Medications Prior to Admission  Medication Sig Dispense Refill Last Dose   Acetaminophen 500 MG coapsule Take by mouth.   12/12/2020   azelastine (ASTELIN) 0.1 % nasal spray Place into both nostrils 2 (two) times daily. Use in each nostril as directed   12/12/2020   calcium carbonate (TUMS - DOSED IN MG ELEMENTAL CALCIUM) 500 MG chewable tablet Chew 1-2 tablets by mouth daily as needed for indigestion or heartburn.   12/12/2020   Cholecalciferol (VITAMIN D3 PO) Take by mouth.   12/12/2020   esomeprazole (NEXIUM) 20 MG capsule Take 20 mg by mouth 2 (two) times daily before a meal.   12/12/2020   furosemide (LASIX) 40 MG tablet Take 40 mg by mouth 2 (two) times daily.   3 12/12/2020   gabapentin (NEURONTIN) 300 MG capsule Take 300 mg by mouth 3 (three) times daily.   12/12/2020   losartan (COZAAR) 25 MG tablet Take 50 mg by mouth daily.   12/12/2020   metolazone (ZAROXOLYN) 5 MG tablet Take 5 mg by mouth daily as needed.   12/12/2020   Multiple Vitamin (MULTIVITAMIN) tablet Take 1 tablet by mouth daily.   12/12/2020   Multiple Vitamins-Minerals (PRESERVISION AREDS 2) CAPS Take 1 capsule by mouth 2 (two) times daily.   12/12/2020   naproxen sodium (ANAPROX) 220 MG tablet Take 220 mg by mouth as needed.   12/12/2020   nitroGLYCERIN (NITROSTAT) 0.4 MG SL tablet Place 0.4 mg under the tongue every 5 (five) minutes x 2 doses as needed for  chest pain.    12/12/2020   nortriptyline (PAMELOR) 50 MG capsule Take by mouth.   12/12/2020   omeprazole (PRILOSEC) 20 MG capsule Take 20 mg by mouth 2 (two) times daily before a meal.   12/12/2020   Polyvinyl Alcohol-Povidone (REFRESH OP) Apply 1 drop to eye daily as needed (dry eyes).   12/12/2020   potassium chloride (KLOR-CON) 20 MEQ packet Take 20 mEq by mouth 2 (two) times a week.   Past Week   rivaroxaban (XARELTO) 20 MG TABS tablet Take 20 mg by mouth daily after breakfast.   Past Week   rosuvastatin (CRESTOR) 10 MG tablet Take 10 mg by mouth daily.   12/12/2020   senna (SENOKOT) 8.6 MG TABS tablet Take 1 tablet by mouth daily as needed for mild constipation.   12/12/2020   spironolactone (ALDACTONE) 25 MG tablet Take 25 mg by mouth daily.   12/12/2020   traMADol (ULTRAM) 50 MG tablet Take by mouth every 6 (six) hours as needed.   12/12/2020   triamcinolone ointment (KENALOG) 0.1 % Apply 1 application topically 2 (two) times daily.   12/12/2020   gabapentin (NEURONTIN) 300 MG capsule Take by mouth.      nortriptyline (PAMELOR) 10 MG capsule Take 10 mg by mouth every evening.  3    omeprazole (PRILOSEC) 20 MG capsule TAKE 1 CAPSULE(20 MG) BY MOUTH TWICE DAILY  potassium chloride SA (K-DUR) 20 MEQ tablet Take 1 tablet (20 mEq total) by mouth 2 (two) times daily for 5 days. 10 tablet 0    predniSONE (DELTASONE) 20 MG tablet Take 5 mg by mouth daily with breakfast.       warfarin (COUMADIN) 5 MG tablet Take 5 mg by mouth daily.        Allergies  Allergen Reactions   Amiodarone Other (See Comments)    Bradycardia and sinus pauses   Baclofen Other (See Comments)    Double vision   Metoprolol Rash   Pantoprazole Rash   Tape Rash     Past Medical History:  Diagnosis Date   Arthritis    Atrial fibrillation (HCC)    Breast cancer ([place]) 2015   DCIS   Cancer ([place])    Cardiac arrest (HCC)    CHF (congestive heart failure) (HCC)    Chickenpox    Chronic kidney disease    Coronary  artery disease    Dyspnea    Dysrhythmia    Edema    GERD (gastroesophageal reflux disease)    Hearing aid worn    bilateral   History of MI (myocardial infarction)    HOH (hard of hearing)    Hypertension 01/23/2012   Myocardial infarction ([place]) 01/01/2013   S/P ablation of atrial fibrillation    Shingles     Review of systems:  Otherwise negative.    Physical Exam  Gen: Alert, oriented. Appears stated age.  HEENT: PERRLA. Lungs: No respiratory distress CV: RRR Abd: soft, benign, no masses Ext: No edema    Planned procedures: Proceed with EGD. The patient understands the nature of the planned procedure, indications, risks, alternatives and potential complications including but not limited to bleeding, infection, perforation, damage to internal organs and possible oversedation/side effects from anesthesia. The patient agrees and gives consent to proceed.  Please refer to procedure notes for findings, recommendations and patient disposition/instructions.     Raylene Miyamoto MD, MPH Gastroenterology 12/13/2020  9:25 AM

## 2020-12-13 NOTE — Anesthesia Preprocedure Evaluation (Addendum)
Anesthesia Evaluation  Patient identified by MRN, date of birth, ID band Patient awake    Reviewed: Allergy & Precautions, H&P , NPO status , Patient's Chart, lab work & pertinent test results, reviewed documented beta blocker date and time   History of Anesthesia Complications Negative for: history of anesthetic complications  Airway Mallampati: II  TM Distance: >3 FB Neck ROM: full    Dental  (+) Teeth Intact, Poor Dentition   Pulmonary shortness of breath and with exertion,    Pulmonary exam normal breath sounds clear to auscultation       Cardiovascular Exercise Tolerance: Poor hypertension, On Medications + CAD, + Past MI, +CHF (nl EF) and + DOE  + dysrhythmias Atrial Fibrillation + pacemaker  Rhythm:Irregular Rate:Normal   Stable SOB w/ exertion; uses cane; denies CP  H/o cardiac arrest 2014 Multiple A-fib ablations Pacemaker placed 2014; PM dependent  Mod MR  Echo 05/2020: INTERPRETATION  NORMAL LEFT VENTRICULAR SYSTOLIC FUNCTION  NORMAL RIGHT VENTRICULAR SYSTOLIC FUNCTION  MODERATE VALVULAR REGURGITATION (See above)  NO VALVULAR STENOSIS  No change in valvular disease from 6/21 study     Neuro/Psych  Headaches, negative psych ROS   GI/Hepatic Neg liver ROS, GERD  Medicated,  Endo/Other  negative endocrine ROSdiabetes, Well Controlled  Renal/GU CRFRenal disease     Musculoskeletal  (+) Arthritis ,   Abdominal (+) - obese (BMI 27),   Peds  Hematology negative hematology ROS (+)   Anesthesia Other Findings   Reproductive/Obstetrics negative OB ROS                            Anesthesia Physical  Anesthesia Plan  ASA: 4  Anesthesia Plan: General   Post-op Pain Management:    Induction: Intravenous  PONV Risk Score and Plan: Propofol infusion and TIVA  Airway Management Planned: Natural Airway and Nasal Cannula  Additional Equipment: None  Intra-op Plan:    Post-operative Plan:   Informed Consent: I have reviewed the patients History and Physical, chart, labs and discussed the procedure including the risks, benefits and alternatives for the proposed anesthesia with the patient or authorized representative who has indicated his/her understanding and acceptance.       Plan Discussed with: Anesthesiologist and CRNA  Anesthesia Plan Comments:         Anesthesia Quick Evaluation

## 2020-12-13 NOTE — Interval H&P Note (Signed)
History and Physical Interval Note:  12/13/2020 9:33 AM  Charlotte Glover  has presented today for surgery, with the diagnosis of Jackhammer esophagus, esophageal dysphagia.  The various methods of treatment have been discussed with the patient and family. After consideration of risks, benefits and other options for treatment, the patient has consented to  Procedure(s): ESOPHAGOGASTRODUODENOSCOPY (EGD) WITH PROPOFOL (N/A) as a surgical intervention.  The patient's history has been reviewed, patient examined, no change in status, stable for surgery.  I have reviewed the patient's chart and labs.  Questions were answered to the patient's satisfaction.     Lesly Rubenstein  Ok to proceed with EGD

## 2020-12-13 NOTE — Op Note (Signed)
[facility] Gastroenterology Patient Name: Charlotte Glover Procedure Date: 12/13/2020 9:45 AM MRN: [id number] Account #: [id number] Date of Birth: 30-Dec-1927 Admit Type: Outpatient Age: 87 Room: [facility] ENDO ROOM 1 Gender: Female Note Status: Finalized Procedure:             Upper GI endoscopy Indications:           Dysphagia Providers:             Andrey Farmer MD, MD Referring MD:          Ramonita Lab, MD (Referring MD) Medicines:             Monitored Anesthesia Care Complications:         No immediate complications. Estimated blood loss:                         Minimal. Procedure:             Pre-Anesthesia Assessment:                        - Prior to the procedure, a History and Physical was                         performed, and patient medications and allergies were                         reviewed. The patient is competent. The risks and                         benefits of the procedure and the sedation options and                         risks were discussed with the patient. All questions                         were answered and informed consent was obtained.                         Patient identification and proposed procedure were                         verified by the physician, the nurse, the anesthetist                         and the technician in the endoscopy suite. Mental                         Status Examination: alert and oriented. Airway                         Examination: normal oropharyngeal airway and neck                         mobility. Respiratory Examination: clear to                         auscultation. CV Examination: normal. Prophylactic                         Antibiotics: The  patient does not require prophylactic                         antibiotics. Prior Anticoagulants: The patient has                         taken Xarelto (rivaroxaban), last dose was 4 days                         prior to procedure. ASA Grade Assessment:  III - A                         patient with severe systemic disease. After reviewing                         the risks and benefits, the patient was deemed in                         satisfactory condition to undergo the procedure. The                         anesthesia plan was to use monitored anesthesia care                         (MAC). Immediately prior to administration of                         medications, the patient was re-assessed for adequacy                         to receive sedatives. The heart rate, respiratory                         rate, oxygen saturations, blood pressure, adequacy of                         pulmonary ventilation, and response to care were                         monitored throughout the procedure. The physical                         status of the patient was re-assessed after the                         procedure.                        After obtaining informed consent, the endoscope was                         passed under direct vision. Throughout the procedure,                         the patient's blood pressure, pulse, and oxygen                         saturations were monitored continuously. The Endoscope  was introduced through the mouth, and advanced to the                         second part of duodenum. The upper GI endoscopy was                         accomplished without difficulty. The patient tolerated                         the procedure well. Findings:      The examined esophagus was moderately tortuous. Biopsies were obtained       from the proximal and distal esophagus with cold forceps for histology       of suspected eosinophilic esophagitis. Estimated blood loss was minimal.      A small hiatal hernia was present.      No endoscopic abnormality was evident in the esophagus to explain the       patient's complaint of dysphagia. It was decided, however, to proceed       with dilation at the  gastroesophageal junction. A TTS dilator was passed       through the scope. Dilation with a 15-16.5-18 mm balloon dilator was       performed to 18 mm. The dilation site was examined and showed mild       improvement in luminal narrowing. Estimated blood loss: none.      Multiple sessile fundic gland polyps with no bleeding and no stigmata of       recent bleeding were found in the gastric body.      The examined duodenum was normal. Impression:            - Tortuous esophagus.                        - Small hiatal hernia.                        - No endoscopic esophageal abnormality to explain                         patient's dysphagia. Esophagus dilated. Dilated.                        - Multiple fundic gland polyps.                        - Normal examined duodenum. Recommendation:        - Discharge patient to home.                        - Resume previous diet.                        - Continue present medications.                        - Await pathology results.                        - Return to referring physician as previously  scheduled. Procedure Code(s):     --- Professional ---                        [phone], Esophagogastroduodenoscopy, flexible,                         transoral; with transendoscopic balloon dilation of                         esophagus (less than 30 mm diameter) Diagnosis Code(s):     --- Professional ---                        Q39.9, Congenital malformation of esophagus,                         unspecified                        K44.9, Diaphragmatic hernia without obstruction or                         gangrene                        R13.10, Dysphagia, unspecified                        K31.7, Polyp of stomach and duodenum CPT copyright 2019 American Medical Association. All rights reserved. The codes documented in this report are preliminary and upon coder review may  be revised to meet current compliance requirements. Andrey Farmer MD, MD 12/13/2020 10:08:29 AM Number of Addenda: 0 Note Initiated On: 12/13/2020 9:45 AM Estimated Blood Loss:  Estimated blood loss was minimal.      [facility]

## 2020-12-13 NOTE — Anesthesia Procedure Notes (Signed)
Date/Time: 12/13/2020 9:59 AM Performed by: Vaughan Sine Pre-anesthesia Checklist: Patient identified, Emergency Drugs available, Suction available, Patient being monitored and Timeout performed Patient Re-evaluated:Patient Re-evaluated prior to induction Oxygen Delivery Method: Nasal cannula Preoxygenation: Pre-oxygenation with 100% oxygen Induction Type: IV induction Airway Equipment and Method: Bite block Placement Confirmation: positive ETCO2 and CO2 detector

## 2020-12-13 NOTE — Transfer of Care (Signed)
Immediate Anesthesia Transfer of Care Note  Patient: Charlotte Glover  Procedure(s) Performed: ESOPHAGOGASTRODUODENOSCOPY (EGD) WITH PROPOFOL  Patient Location: PACU  Anesthesia Type:General  Level of Consciousness: awake and sedated  Airway & Oxygen Therapy: Patient Spontanous Breathing and Patient connected to nasal cannula oxygen  Post-op Assessment: Report given to RN and Post -op Vital signs reviewed and stable  Post vital signs: Reviewed and stable  Last Vitals:  Vitals Value Taken Time  BP    Temp    Pulse    Resp    SpO2      Last Pain:  Vitals:   12/13/20 0909  TempSrc: Temporal  PainSc: 0-No pain         Complications: No notable events documented.

## 2020-12-13 NOTE — Anesthesia Postprocedure Evaluation (Signed)
Anesthesia Post Note  Patient: Charlotte Glover  Procedure(s) Performed: ESOPHAGOGASTRODUODENOSCOPY (EGD) WITH PROPOFOL  Patient location during evaluation: Endoscopy Anesthesia Type: General Level of consciousness: awake and alert Pain management: pain level controlled Vital Signs Assessment: post-procedure vital signs reviewed and stable Respiratory status: spontaneous breathing, nonlabored ventilation, respiratory function stable and patient connected to nasal cannula oxygen Cardiovascular status: blood pressure returned to baseline and stable Postop Assessment: no apparent nausea or vomiting Anesthetic complications: no   No notable events documented.   Last Vitals:  Vitals:   12/13/20 1032 12/13/20 1042  BP: 132/66 140/70  Pulse: 70 70  Resp: 15 16  Temp:    SpO2: 98% 98%    Last Pain:  Vitals:   12/13/20 1022  TempSrc: Temporal  PainSc: 0-No pain                 Larey Dresser

## 2020-12-14 ENCOUNTER — Encounter: Payer: Self-pay | Admitting: Gastroenterology

## 2020-12-15 LAB — SURGICAL PATHOLOGY

## 2020-12-19 ENCOUNTER — Ambulatory Visit: Payer: Medicare Other | Admitting: Physical Therapy

## 2020-12-19 ENCOUNTER — Encounter: Payer: Self-pay | Admitting: Physical Therapy

## 2020-12-19 ENCOUNTER — Other Ambulatory Visit: Payer: Self-pay

## 2020-12-19 DIAGNOSIS — M6281 Muscle weakness (generalized): Secondary | ICD-10-CM

## 2020-12-19 DIAGNOSIS — G8929 Other chronic pain: Secondary | ICD-10-CM

## 2020-12-19 DIAGNOSIS — R262 Difficulty in walking, not elsewhere classified: Secondary | ICD-10-CM | POA: Diagnosis not present

## 2020-12-19 DIAGNOSIS — R2689 Other abnormalities of gait and mobility: Secondary | ICD-10-CM

## 2020-12-19 NOTE — Therapy (Signed)
[facility] [address] [place], [place], [address] Phone: [phone]   Fax:  [phone]  Physical Therapy Re-certification  Patient Details  Name: Charlotte Glover MRN: [id number] Date of Birth: [birth year] No data recorded  Encounter Date: 12/19/2020   PT End of Session - 12/19/20 1550     Visit Number 18    Number of Visits 33    Date for PT Re-Evaluation 01/16/21    Authorization Type medicare    PT Start Time 1445    PT Stop Time 3295    PT Time Calculation (min) 45 min    Equipment Utilized During Treatment Gait belt    Activity Tolerance Patient tolerated treatment well    Behavior During Therapy WFL for tasks assessed/performed             Past Medical History:  Diagnosis Date   Arthritis    Atrial fibrillation ([place])    Breast cancer ([place]) 2015   DCIS   Cancer ([place])    Cardiac arrest ([place])    CHF (congestive heart failure) ([place])    Chickenpox    Chronic kidney disease    Coronary artery disease    Dyspnea    Dysrhythmia    Edema    GERD (gastroesophageal reflux disease)    Hearing aid worn    bilateral   History of MI (myocardial infarction)    HOH (hard of hearing)    Hypertension 01/23/2012   Myocardial infarction ([place]) 01/01/2013   S/P ablation of atrial fibrillation    Shingles     Past Surgical History:  Procedure Laterality Date   ANTERIOR VITRECTOMY Left 03/20/2017   Procedure: ANTERIOR VITRECTOMY;  Surgeon: Leandrew Koyanagi, MD;  Location: [facility];  Service: Ophthalmology;  Laterality: Left;  IVA TOPICAL LEFT   ARTERY BIOPSY Right 04/08/2019   Procedure: BIOPSY TEMPORAL ARTERY;  Surgeon: Algernon Huxley, MD;  Location: [facility];  Service: Vascular;  Laterality: Right;   ATRIAL ABLATION SURGERY     BREAST BIOPSY Right 2015   + for DCIS    BREAST SURGERY     CARDIAC CATHETERIZATION     CARDIAC CATHETERIZATION Bilateral 05/08/2016   Procedure: Right/Left Heart Cath  and Coronary Angiography;  Surgeon: Isaias Cowman, MD;  Location: [facility] [facility];  Service: Cardiovascular;  Laterality: Bilateral;   CATARACT EXTRACTION W/PHACO Left 03/20/2017   Procedure: IOL EXCHANGE;  Surgeon: Leandrew Koyanagi, MD;  Location: [facility];  Service: Ophthalmology;  Laterality: Left;   CHOLECYSTECTOMY     DILATION AND CURETTAGE OF UTERUS     ESOPHAGOGASTRODUODENOSCOPY     ESOPHAGOGASTRODUODENOSCOPY (EGD) WITH PROPOFOL N/A 12/13/2020   Procedure: ESOPHAGOGASTRODUODENOSCOPY (EGD) WITH PROPOFOL;  Surgeon: Lesly Rubenstein, MD;  Location: [facility];  Service: Endoscopy;  Laterality: N/A;   PACEMAKER INSERTION     PERIPHERAL IRIDOTOMY Left 03/20/2017   Procedure: PERIPHERAL IRIDECTOMY;  Surgeon: Leandrew Koyanagi, MD;  Location: [place];  Service: Ophthalmology;  Laterality: Left;   ROTATOR CUFF REPAIR Left    TEE WITHOUT CARDIOVERSION N/A 05/16/2016   Procedure: TRANSESOPHAGEAL ECHOCARDIOGRAM (TEE);  Surgeon: Teodoro Spray, MD;  Location: [facility];  Service: Cardiovascular;  Laterality: N/A;   TONSILLECTOMY     uterine polyp      There were no vitals filed for this visit.   Subjective Assessment - 12/19/20 1458     Subjective Pt states that she is doing alright today. Her right leg continues to feel "  heavy". She arrives with a new knee brace that she ordered online. She has been cleaning/doing housework all morning and states that she is a bit worn out. No specific questions or concerns currently.    Pertinent History Pts main complaint is her balance. Has a fear of falling but has not actually fallen. Stated she will tend to lean or start falling backwards, and reports some staggering in her gait path as well. Also reported she would like to work on her leg strength to make it easier to transfer like off/on the commode, out of chairs. Patient has been in therapy previously for vertigo as well as general conditioning. Has had issue  with her balance for less than 5 years. Feels her balance has worsened in the last few weeks. Pt does have chronic R knee pain and R shoulder pain. Pt also reported chronic DOE, that she feels has worsened lately as well. No AD use in her home, but will use her [facility] or rollator for community ambulation. Pt also mentioned some neck pain and potential radicular symptoms to RUE as well as chronic R rotator cuff tear.   PMH of afib, temporal arteritis, pacemaker (sees a cardiologist regularly), HTN, meningioma, CKDIII, NSTEMI, chronic headaches, breast cancer, TIA about 4 years ago.    How long can you sit comfortably? NA    How long can you stand comfortably? patient reported her R knee limits her ability to stand for long periods    How long can you walk comfortably? Pt feels winded by shorter distances now, for example from parking lot to store. Has to stop and catch her breath    Patient Stated Goals improve balance,    Currently in Pain? Yes    Pain Location Knee    Pain Orientation Right    Pain Descriptors / Indicators Burning    Pain Type Chronic pain    Pain Onset More than a month ago               TREATMENT    Therapeutic Exercise NuStep L2-3 for 5 minutes as history was obtained;  Outcome Measures *updated BERG: 47/56 5TSTS: 24.51 DGI: 17/24 FOTO: deferred to next visit LE MMT: 3+/5      Pt educated throughout session about proper posture and technique with exercises. Improved exercise technique, movement at target joints, use of target muscles after min to mod verbal, visual, tactile cues.      Patient demonstrates excellent motivation during session today. She arrived with a new knee brace and asked PT to assist with fitting and donning the brace to her right knee. She did report fatigue after 5 minute NuStep warmup. A re-certification was performed at today's session.Pt demo no to little improvement on LE MMT (3+/5 bilateral hip), BERG (), 5TSTS (24.51s) and DGI (17)  compared to previous re-certification scores. Patient does admit to being fatigued upon arrival today due to completing household chores all morning. Session ran out of time to perform there-ex after outcome measures. She does not report any "swirly" feelings today. Pt is making progress towards goals; accuracy of outcome measure scores may be impacted by patient fatigue and interrater reliability. Patient encouraged to continue current HEP and follow-up as scheduled.  She will benefit from PT services to address deficits in strength, balance, and mobility in order to return to full function at home.           PT Short Term Goals - 12/19/20 1612  PT SHORT TERM GOAL #1   Title Pt will be independent with initial HEP in order to improve strength and balance in order to decrease fall risk and improve function at home    Time 4    Period Weeks    Status Achieved    Target Date 12/02/20               PT Long Term Goals - 12/19/20 1613       PT LONG TERM GOAL #1   Title Pt will be independent with finalized HEP in order to improve strength and balance in order to decrease fall risk and improve function at home    Time 8    Period Weeks    Status On-going    Target Date 01/16/21      PT LONG TERM GOAL #2   Title Pt will improve BERG to at least 50/56 in order to demonstrate clinically significant improvement in balance.    Baseline 42/56 on eval 3/18; 10/28/20: 47/56; 12/19/20: 47/56    Time 8    Period Weeks    Status Partially Met    Target Date 01/16/21      PT LONG TERM GOAL #3   Title Pt will decrease 5TSTS by at least 3 seconds in order to demonstrate clinically significant improvement in LE strength.    Baseline 35seconds with BUE support on eval 3/18; 10/28/20: 19.5s with BUE support; 12/19/20: 24.5s with BUE support    Time 8    Period Weeks    Status Partially Met    Target Date 01/16/21      PT LONG TERM GOAL #4   Title Pt will improve DGI by at least 3 points  in order to demonstrate clinically significant improvement in balance and decreased risk for falls.    Baseline 09/12/20: 17/24, 10/28/20: 18/24, 12/19/20: 17/24    Time 8    Period Weeks    Status Partially Met    Target Date 01/16/21      PT LONG TERM GOAL #5   Title Patient will demonstrate at 12 point improvement in her FOTO score indicating improved function and safety with functional activities.    Baseline 3/18 eval 43; 10/28/20: 53; *12/19/20 deferred to next session    Time 8    Period Weeks    Status Partially Met    Target Date 01/16/21      PT LONG TERM GOAL #6   Title The patient will demonstrate at least 1/2 grade improvement in LLE MMT indicating improved strength and ability to perform functional activities such as transfers, ambulation, stairs, curbs, etc    Baseline on eval 3/18 grossly 3+/5 for hip strength; 10/28/20: Partially met, see visit note; 12/19/20: grossly 3+/5 for hip strength    Time 8    Period Weeks    Status Partially Met    Target Date 01/16/21                   Plan - 12/19/20 1551     Clinical Impression Statement Patient demonstrates excellent motivation during session today. She arrived with a new knee brace and asked PT to assist with fitting and donning the brace to her right knee. She did report fatigue after 5 minute NuStep warmup. A re-certification was performed at today's session.Pt demo no to little improvement on LE MMT (3+/5 bilateral hip), BERG (), 5TSTS (24.51s) and DGI (17) compared to previous re-certification scores. Patient does admit to being  fatigued upon arrival today due to completing household chores all morning. Session ran out of time to perform there-ex after outcome measures. She does not report any "swirly" feelings today. Pt is making progress towards goals; accuracy of outcome measure scores may be impacted by patient fatigue and interrater reliability. Patient encouraged to continue current HEP and follow-up as scheduled.   She will benefit from PT services to address deficits in strength, balance, and mobility in order to return to full function at home.    Personal Factors and Comorbidities Age;Comorbidity 3+;Fitness    Comorbidities afib, temporal arteritis, pacemaker (sees a cardiologist regularly), HTN, meningioma, CKDIII, NSTEMI, chronic headaches, breast cancer, TIA about 4 years ago.    Examination-Activity Limitations Transfers;Bed Mobility;Locomotion Level;Stairs;Stand;Reach Overhead;Squat;Lift;Bend    Examination-Participation Restrictions Church;Laundry;Cleaning;Shop;Community Activity;Volunteer;Meal Prep;Interpersonal Relationship    Stability/Clinical Decision Making Stable/Uncomplicated    Rehab Potential Good    PT Frequency 2x / week    PT Duration 8 weeks    PT Treatment/Interventions Canalith Repostioning;ADLs/Self Care Home Management;Gait training;Stair training;Therapeutic activities;Therapeutic exercise;Balance training;Neuromuscular re-education;Patient/family education;Vestibular;Cryotherapy;Moist Heat;Electrical Stimulation;Splinting;Taping;Joint Manipulations;Spinal Manipulations;Dry needling;Energy conservation;Passive range of motion;Aquatic Therapy    PT Next Visit Plan balance and strength interventions    PT Home Exercise Plan Access Code: [email or web address]    Consulted and Agree with Plan of Care Patient             Patient will benefit from skilled therapeutic intervention in order to improve the following deficits and impairments:  Abnormal gait, Decreased balance, Decreased endurance, Difficulty walking, Cardiopulmonary status limiting activity, Decreased activity tolerance, Decreased strength  Visit Diagnosis: Difficulty in walking, not elsewhere classified  Muscle weakness (generalized)  Balance problem  Chronic pain of right knee     Problem List Patient Active Problem List   Diagnosis Date Noted   Swelling of limb 11/01/2020   Intracranial carotid stenosis, right  05/29/2019   Chronic headaches 05/29/2019   Long term current use of systemic steroids 05/04/2019   Screening for osteoporosis 05/04/2019   Temporal arteritis ([place]) 04/07/2019   Meningioma ([place]) 12/29/2018   Chronic daily headache 11/20/2018   Elevated erythrocyte sedimentation rate 11/05/2018   CKD (chronic kidney disease) stage 3, GFR 30-59 ml/min (HCC) 12/27/2017   Shingles 05/06/2017   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 04/12/2017   Primary osteoarthritis of right shoulder 12/07/2016   Rotator cuff tear, right 12/07/2016   Rotator cuff tendinitis, right 12/07/2016   CHB (complete heart block) ([place]) 06/30/2016   Severe mitral regurgitation 05/29/2016   Pedal edema 04/23/2016   Lung nodule, multiple 12/28/2015   Amnesia, global, transient 04/05/2015   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) 11/27/2011   Coronary artery disease 11/27/2011   Patrina Levering PT, DPT  Ramon[facility] Lab 12/19/2020, 5:18 PM  [facility] [address] [place], [place], [address] Phone: [phone]   Fax:  [phone]  Name: Adreena Willits MRN: [id number] Date of  Birth: 06/08/1928

## 2020-12-30 ENCOUNTER — Ambulatory Visit: Payer: Medicare Other | Attending: Internal Medicine

## 2020-12-30 ENCOUNTER — Other Ambulatory Visit: Payer: Self-pay

## 2020-12-30 DIAGNOSIS — M6281 Muscle weakness (generalized): Secondary | ICD-10-CM | POA: Insufficient documentation

## 2020-12-30 DIAGNOSIS — R262 Difficulty in walking, not elsewhere classified: Secondary | ICD-10-CM | POA: Insufficient documentation

## 2020-12-30 NOTE — Therapy (Signed)
[facility] [address]. [place], [place], [address] Phone: [phone]   Fax:  [phone]  Physical Therapy Treatment/Discharge  Patient Details  Name: Charlotte Glover MRN: [id number] Date of Birth: [birth year] No data recorded  Encounter Date: 12/30/2020   PT End of Session - 12/30/20 1519     Visit Number 19    Number of Visits 33    Date for PT Re-Evaluation 01/16/21    Authorization Type medicare    PT Start Time 1147    PT Stop Time 1155    PT Time Calculation (min) 8 min    Equipment Utilized During Treatment Gait belt    Activity Tolerance Patient tolerated treatment well    Behavior During Therapy WFL for tasks assessed/performed             Past Medical History:  Diagnosis Date   Arthritis    Atrial fibrillation ([place])    Breast cancer ([place]) 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   Cancer ([place])    Cardiac arrest ([place])    CHF (congestive heart failure) ([place])    Chickenpox    Chronic kidney disease    Coronary artery disease    Dyspnea    Dysrhythmia    Edema    GERD (gastroesophageal reflux disease)    Hearing aid worn    bilateral   History of MI (myocardial infarction)    HOH (hard of hearing)    Hypertension 01/23/2012   Myocardial infarction ([place]) 01/01/2013   S/P ablation of atrial fibrillation    Shingles     Past Surgical History:  Procedure Laterality Date   ANTERIOR VITRECTOMY Left 03/20/2017   Procedure: ANTERIOR VITRECTOMY;  Surgeon: Leandrew Koyanagi, MD;  Location: [place];  Service: Ophthalmology;  Laterality: Left;  IVA TOPICAL LEFT   ARTERY BIOPSY Right 04/08/2019   Procedure: BIOPSY TEMPORAL ARTERY;  Surgeon: Algernon Huxley, MD;  Location: [facility];  Service: Vascular;  Laterality: Right;   ATRIAL ABLATION SURGERY     BREAST BIOPSY Right 2015   + for DCIS    BREAST SURGERY     CARDIAC CATHETERIZATION     CARDIAC CATHETERIZATION Bilateral 05/08/2016   Procedure: Right/Left Heart Cath  and Coronary Angiography;  Surgeon: Isaias Cowman, MD;  Location: [place];  Service: Cardiovascular;  Laterality: Bilateral;   CATARACT EXTRACTION W/PHACO Left 03/20/2017   Procedure: IOL EXCHANGE;  Surgeon: Leandrew Koyanagi, MD;  Location: [place];  Service: Ophthalmology;  Laterality: Left;   CHOLECYSTECTOMY     DILATION AND CURETTAGE OF UTERUS     ESOPHAGOGASTRODUODENOSCOPY     ESOPHAGOGASTRODUODENOSCOPY (EGD) WITH PROPOFOL N/A 12/13/2020   Procedure: ESOPHAGOGASTRODUODENOSCOPY (EGD) WITH PROPOFOL;  Surgeon: Lesly Rubenstein, MD;  Location: [facility];  Service: Endoscopy;  Laterality: N/A;   PACEMAKER INSERTION     PERIPHERAL IRIDOTOMY Left 03/20/2017   Procedure: PERIPHERAL IRIDECTOMY;  Surgeon: Leandrew Koyanagi, MD;  Location: [place];  Service: Ophthalmology;  Laterality: Left;   ROTATOR CUFF REPAIR Left    TEE WITHOUT CARDIOVERSION N/A 05/16/2016   Procedure: TRANSESOPHAGEAL ECHOCARDIOGRAM (TEE);  Surgeon: Teodoro Spray, MD;  Location: [facility];  Service: Cardiovascular;  Laterality: N/A;   TONSILLECTOMY     uterine polyp      There were no vitals filed for this visit.   Subjective Assessment - 12/30/20 1200     Subjective Pt reports that she is doing well. She feels confident continuing her exercises independently.  Her R knee has been very painful lately and she bought a brace to help but is unsure if it makes a difference. She denies any resting knee pain upon arrival but it "catches" frequency when she moves or walks. She feels like she is ready for discharge at this time.    Pertinent History Pts main complaint is her balance. Has a fear of falling but has not actually fallen. Stated she will tend to lean or start falling backwards, and reports some staggering in her gait path as well. Also reported she would like to work on her leg strength to make it easier to transfer like off/on the commode, out of chairs. Patient has been  in therapy previously for vertigo as well as general conditioning. Has had issue with her balance for less than 5 years. Feels her balance has worsened in the last few weeks. Pt does have chronic R knee pain and R shoulder pain. Pt also reported chronic DOE, that she feels has worsened lately as well. No AD use in her home, but will use her [facility] or rollator for community ambulation. Pt also mentioned some neck pain and potential radicular symptoms to RUE as well as chronic R rotator cuff tear.   PMH of afib, temporal arteritis, pacemaker (sees a cardiologist regularly), HTN, meningioma, CKDIII, NSTEMI, chronic headaches, breast cancer, TIA about 4 years ago.    How long can you sit comfortably? NA    How long can you stand comfortably? patient reported her R knee limits her ability to stand for long periods    How long can you walk comfortably? Pt feels winded by shorter distances now, for example from parking lot to store. Has to stop and catch her breath    Patient Stated Goals improve balance,    Pain Onset --                 Patient arrived complaining of sinus headache and intermittent worsening R knee pain. She prefers not to perform therapy on this date. Therapist assisted her in donning her new R knee brace. Pt completed FOTO questionnaire. Reviewed HEP. Outcome measures and goals were updated during her last session. At that time pt demonstrated no to little improvement from her last goal update on LE MMT, BERG, 5TSTS and DGI. However since starting therapy she has demonstrated improvement in all of these measures. Her FOTO score improved today to 58 indicating improvement in function related to her leg weakness and balance. Pt states she is independent with her HEP and would like to discharge at this time. Pt will be discharged today with instruction to continue her HEP independently. If she has any regression or worsening function pt encouraged to call back to schedule additional therapy.                           PT Short Term Goals - 12/30/20 1519       PT SHORT TERM GOAL #1   Title Pt will be independent with initial HEP in order to improve strength and balance in order to decrease fall risk and improve function at home    Time 4    Period Weeks    Status Achieved               PT Long Term Goals - 12/30/20 1521       PT LONG TERM GOAL #1   Title Pt will be independent with finalized  HEP in order to improve strength and balance in order to decrease fall risk and improve function at home    Time 8    Period Weeks    Status Achieved      PT LONG TERM GOAL #2   Title Pt will improve BERG to at least 50/56 in order to demonstrate clinically significant improvement in balance.    Baseline 42/56 on eval 3/18; 10/28/20: 47/56; 12/19/20: 47/56    Time 8    Period Weeks    Status Partially Met      PT LONG TERM GOAL #3   Title Pt will decrease 5TSTS by at least 3 seconds in order to demonstrate clinically significant improvement in LE strength.    Baseline 35seconds with BUE support on eval 3/18; 10/28/20: 19.5s with BUE support; 12/19/20: 24.5s with BUE support    Time 8    Period Weeks    Status Partially Met      PT LONG TERM GOAL #4   Title Pt will improve DGI by at least 3 points in order to demonstrate clinically significant improvement in balance and decreased risk for falls.    Baseline 09/12/20: 17/24, 10/28/20: 18/24, 12/19/20: 17/24    Time 8    Period Weeks    Status Partially Met      PT LONG TERM GOAL #5   Title Patient will demonstrate at 12 point improvement in her FOTO score indicating improved function and safety with functional activities.    Baseline 3/18 eval 43; 10/28/20: 53; *12/19/20 deferred to next session; 12/30/20: 58    Time 8    Period Weeks    Status Achieved      PT LONG TERM GOAL #6   Title The patient will demonstrate at least 1/2 grade improvement in LLE MMT indicating improved strength and ability to perform  functional activities such as transfers, ambulation, stairs, curbs, etc    Baseline on eval 3/18 grossly 3+/5 for hip strength; 10/28/20: Partially met, see visit note; 12/19/20: grossly 3+/5 for hip strength    Time 8    Period Weeks    Status Partially Met                   Plan - 12/30/20 1519     Clinical Impression Statement Patient arrived complaining of sinus headache and intermittent worsening R knee pain. She prefers not to perform therapy on this date. Therapist assisted her in donning her new R knee brace. Pt completed FOTO questionnaire. Reviewed HEP. Outcome measures and goals were updated during her last session. At that time pt demonstrated no to little improvement from her last goal update on LE MMT, BERG, 5TSTS and DGI. However since starting therapy she has demonstrated improvement in all of these measures. Her FOTO score improved today to 58 indicating improvement in function related to her leg weakness and balance. Pt states she is independent with her HEP and would like to discharge at this time. Pt will be discharged today with instruction to continue her HEP independently. If she has any regression or worsening function pt encouraged to call back to schedule additional therapy.    Personal Factors and Comorbidities Age;Comorbidity 3+;Fitness    Comorbidities afib, temporal arteritis, pacemaker (sees a cardiologist regularly), HTN, meningioma, CKDIII, NSTEMI, chronic headaches, breast cancer, TIA about 4 years ago.    Examination-Activity Limitations Transfers;Bed Mobility;Locomotion Level;Stairs;Stand;Reach Overhead;Squat;Lift;Bend    Examination-Participation Restrictions Church;Laundry;Cleaning;Shop;Community Activity;Volunteer;Meal Prep;Interpersonal Relationship    Stability/Clinical Decision Making Stable/Uncomplicated  Rehab Potential Good    PT Frequency 2x / week    PT Duration 8 weeks    PT Treatment/Interventions Canalith Repostioning;ADLs/Self Care Home  Management;Gait training;Stair training;Therapeutic activities;Therapeutic exercise;Balance training;Neuromuscular re-education;Patient/family education;Vestibular;Cryotherapy;Moist Heat;Electrical Stimulation;Splinting;Taping;Joint Manipulations;Spinal Manipulations;Dry needling;Energy conservation;Passive range of motion;Aquatic Therapy    PT Next Visit Plan balance and strength interventions    PT Home Exercise Plan Access Code: [email or web address]    Consulted and Agree with Plan of Care Patient             Patient will benefit from skilled therapeutic intervention in order to improve the following deficits and impairments:  Abnormal gait, Decreased balance, Decreased endurance, Difficulty walking, Cardiopulmonary status limiting activity, Decreased activity tolerance, Decreased strength  Visit Diagnosis: Difficulty in walking, not elsewhere classified  Muscle weakness (generalized)     Problem List Patient Active Problem List   Diagnosis Date Noted   Swelling of limb 11/01/2020   Intracranial carotid stenosis, right 05/29/2019   Chronic headaches 05/29/2019   Long term current use of systemic steroids 05/04/2019   Screening for osteoporosis 05/04/2019   Temporal arteritis ([place]) 04/07/2019   Meningioma ([place]) 12/29/2018   Chronic daily headache 11/20/2018   Elevated erythrocyte sedimentation rate 11/05/2018   CKD (chronic kidney disease) stage 3, GFR 30-59 ml/min (HCC) 12/27/2017   Shingles 05/06/2017   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 04/12/2017   Primary osteoarthritis of right shoulder 12/07/2016   Rotator cuff tear, right 12/07/2016   Rotator cuff tendinitis, right 12/07/2016   CHB (complete heart block) ([place]) 06/30/2016   Severe mitral regurgitation 05/29/2016   Pedal edema 04/23/2016   Lung nodule, multiple 12/28/2015   Amnesia, global, transient 04/05/2015   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 06/30/2014    Cardiac pacemaker 05/18/2014   Sleep disorder 05/06/2014   Diffuse pain 04/06/2014   Fatigue 03/16/2014   Headache 03/16/2014   History of melanoma 11/24/2013   Melanoma ([place]) 11/24/2013   DCIS (ductal carcinoma in situ) 08/11/2013   History of ductal carcinoma in situ (DCIS) of breast 08/11/2013   H/O non-ST elevation myocardial infarction (NSTEMI) 01/01/2013   Hearing impairment 01/01/2013   S/P ablation of atrial fibrillation 07/11/2012   Edema 01/23/2012   Hypertension 01/23/2012   Atrial fibrillation ([place]) 11/27/2011   Coronary artery disease 11/27/2011   Lyndel Safe Admire Bunnell PT, DPT, GCS  Renardo Cheatum 12/30/2020, 3:27 PM  [facility] [address]. [place], [place], [address] Phone: [phone]   Fax:  [phone]  Name: Charlotte Glover MRN: [id number] Date of Birth: 11-May-1928

## 2021-04-09 DIAGNOSIS — M1711 Unilateral primary osteoarthritis, right knee: Principal | ICD-10-CM | POA: Insufficient documentation

## 2021-08-07 DIAGNOSIS — D6869 Other thrombophilia: Secondary | ICD-10-CM | POA: Insufficient documentation

## 2021-12-06 NOTE — Discharge Instructions (Signed)
Instructions after Total Knee Replacement   Charlotte Glover, Jr., M.D.     Dept. of Orthopaedics & Sports Medicine  [facility]  [address]  [place], [place]  [address]  Phone: [phone]   Fax: [phone]    DIET: Drink plenty of non-alcoholic fluids. Resume your normal diet. Include foods high in fiber.  ACTIVITY:  You may use crutches or a walker with weight-bearing as tolerated, unless instructed otherwise. You may be weaned off of the walker or crutches by your Physical Therapist.  Do NOT place pillows under the knee. Anything placed under the knee could limit your ability to straighten the knee.   Continue doing gentle exercises. Exercising will reduce the pain and swelling, increase motion, and prevent muscle weakness.   Please continue to use the TED compression stockings for 6 weeks. You may remove the stockings at night, but should reapply them in the morning. Do not drive or operate any equipment until instructed.  WOUND CARE:  Continue to use the PolarCare or ice packs periodically to reduce pain and swelling. You may bathe or shower after the staples are removed at the first office visit following surgery.  MEDICATIONS: You may resume your regular medications. Please take the pain medication as prescribed on the medication. Do not take pain medication on an empty stomach. You have been given a prescription for a blood thinner (Lovenox or Coumadin). Please take the medication as instructed. (NOTE: After completing a 2 week course of Lovenox, take one Enteric-coated aspirin once a day. This along with elevation will help reduce the possibility of phlebitis in your operated leg.) Do not drive or drink alcoholic beverages when taking pain medications.  CALL THE OFFICE FOR: Temperature above 101 degrees Excessive bleeding or drainage on the dressing. Excessive swelling, coldness, or paleness of the toes. Persistent nausea and vomiting.  FOLLOW-UP:  You  should have an appointment to return to the office in 10-14 days after surgery. Arrangements have been made for continuation of Physical Therapy (either home therapy or outpatient therapy).   [facility] Department Directory         [email or web address]       [email or web address]          Cardiology  Appointments: [place] - [phone] [place] - [phone]  Endocrinology  Appointments: [place] - [phone] [place] - [phone]  Gastroenterology  Appointments: [place] - [phone] [place] - [phone]        General Surgery   Appointments: [place] - [phone]  Internal Medicine/Family Medicine  Appointments: [place] - [phone] [place] - [phone] [place] - [phone]  Metabolic and Weigh Loss Surgery  Appointments: [place] - [phone]        Neurology  Appointments: [place] - [phone] [place] - [phone]  Neurosurgery  Appointments: [place] - [phone]  Obstetrics & Gynecology  Appointments: [place] - [phone] [place] - [phone]        Pediatrics  Appointments: [place] - [phone] [place] - [phone]  Physiatry  Appointments: [place] -[phone]  Physical Therapy  Appointments: [place] - [phone] [place] - [phone]        Podiatry  Appointments: [place] - [phone] [place] - [phone]  Pulmonology  Appointments: [place] - [phone]  Rheumatology  Appointments: [place] - [phone]        [place] Location: [facility]  [address] , [place]  [address]  [place] Location: [facility] [address] [place], [place]  [address]  [place] Location: [facility] [address] [place], [place]  [address]    

## 2021-12-13 ENCOUNTER — Encounter
Admission: RE | Admit: 2021-12-13 | Discharge: 2021-12-13 | Disposition: A | Discharge status: Home / Self Care | Payer: Medicare Other | Source: Ambulatory Visit | Attending: Orthopedic Surgery | Admitting: Orthopedic Surgery

## 2021-12-13 ENCOUNTER — Other Ambulatory Visit: Payer: Self-pay

## 2021-12-13 VITALS — BP 135/58 | HR 71 | Resp 16 | Ht 67.0 in | Wt 163.8 lb

## 2021-12-13 DIAGNOSIS — M1711 Unilateral primary osteoarthritis, right knee: Secondary | ICD-10-CM

## 2021-12-13 DIAGNOSIS — R799 Abnormal finding of blood chemistry, unspecified: Secondary | ICD-10-CM | POA: Diagnosis not present

## 2021-12-13 DIAGNOSIS — R600 Localized edema: Secondary | ICD-10-CM

## 2021-12-13 DIAGNOSIS — Z7952 Long term (current) use of systemic steroids: Secondary | ICD-10-CM | POA: Diagnosis not present

## 2021-12-13 DIAGNOSIS — I2510 Atherosclerotic heart disease of native coronary artery without angina pectoris: Secondary | ICD-10-CM

## 2021-12-13 DIAGNOSIS — Z01812 Encounter for preprocedural laboratory examination: Secondary | ICD-10-CM

## 2021-12-13 HISTORY — DX: Presence of cardiac pacemaker: Z95.0

## 2021-12-13 LAB — SURGICAL PCR SCREEN
MRSA, PCR: NEGATIVE
Staphylococcus aureus: NEGATIVE

## 2021-12-13 LAB — URINALYSIS, ROUTINE W REFLEX MICROSCOPIC
Bilirubin Urine: NEGATIVE
Glucose, UA: NEGATIVE mg/dL
Hgb urine dipstick: NEGATIVE
Ketones, ur: NEGATIVE mg/dL
Nitrite: NEGATIVE
Protein, ur: NEGATIVE mg/dL
Specific Gravity, Urine: 1.014 (ref 1.005–1.030)
Squamous Epithelial / HPF: >50 — ABNORMAL HIGH (ref 0–5)
pH: 5 (ref 5.0–8.0)

## 2021-12-13 LAB — CBC
HCT: 43.8 % (ref 36.0–46.0)
Hemoglobin: 14.1 g/dL (ref 12.0–15.0)
MCH: 29.5 pg (ref 26.0–34.0)
MCHC: 32.2 g/dL (ref 30.0–36.0)
MCV: 91.6 fL (ref 80.0–100.0)
Platelets: 290 K/uL (ref 150–400)
RBC: 4.78 MIL/uL (ref 3.87–5.11)
RDW: 15.3 % (ref 11.5–15.5)
WBC: 8.7 K/uL (ref 4.0–10.5)
nRBC: 0 % (ref 0.0–0.2)

## 2021-12-13 LAB — COMPREHENSIVE METABOLIC PANEL WITH GFR
ALT: 42 U/L (ref 0–44)
AST: 26 U/L (ref 15–41)
Albumin: 3.6 g/dL (ref 3.5–5.0)
Alkaline Phosphatase: 155 U/L — ABNORMAL HIGH (ref 38–126)
Anion gap: 12 (ref 5–15)
BUN: 28 mg/dL — ABNORMAL HIGH (ref 8–23)
CO2: 33 mmol/L — ABNORMAL HIGH (ref 22–32)
Calcium: 9.5 mg/dL (ref 8.9–10.3)
Chloride: 90 mmol/L — ABNORMAL LOW (ref 98–111)
Creatinine, Ser: 1.22 mg/dL — ABNORMAL HIGH (ref 0.44–1.00)
GFR, Estimated: 42 mL/min — ABNORMAL LOW
Glucose, Bld: 106 mg/dL — ABNORMAL HIGH (ref 70–99)
Potassium: 3.3 mmol/L — ABNORMAL LOW (ref 3.5–5.1)
Sodium: 135 mmol/L (ref 135–145)
Total Bilirubin: 0.8 mg/dL (ref 0.3–1.2)
Total Protein: 7.2 g/dL (ref 6.5–8.1)

## 2021-12-13 LAB — TYPE AND SCREEN
ABO/RH(D): A POS
Antibody Screen: NEGATIVE

## 2021-12-13 LAB — SEDIMENTATION RATE: Sed Rate: 46 mm/h — ABNORMAL HIGH (ref 0–30)

## 2021-12-13 LAB — C-REACTIVE PROTEIN: CRP: 1.9 mg/dL — ABNORMAL HIGH

## 2021-12-13 NOTE — Patient Instructions (Addendum)
Your procedure is scheduled on: 12/25/21 - Monday Report to the Registration Desk on the 1st floor of the [facility]. To find out your arrival time, please call [phone] between 1PM - 3PM on: 12/22/21 - Friday If your arrival time is 6:00 am, do not arrive prior to that time as the [facility] entrance doors do not open until 6:00 am.  REMEMBER: Instructions that are not followed completely may result in serious medical risk, up to and including death; or upon the discretion of your surgeon and anesthesiologist your surgery may need to be rescheduled.  Do not eat food after midnight the night before surgery.  No gum chewing, lozengers or hard candies.  You may however, drink CLEAR liquids up to 2 hours before you are scheduled to arrive for your surgery. Do not drink anything within 2 hours of your scheduled arrival time.  Clear liquids include: - water  - apple juice without pulp - gatorade (not RED colors) - black coffee or tea (Do NOT add milk or creamers to the coffee or tea) Do NOT drink anything that is not on this list.  In addition, your doctor has ordered for you to drink the provided  Ensure Pre-Surgery Clear Carbohydrate Drink  Drinking this carbohydrate drink up to two hours before surgery helps to reduce insulin resistance and improve patient outcomes. Please complete drinking 2 hours prior to scheduled arrival time.  TAKE THESE MEDICATIONS THE MORNING OF SURGERY WITH A SIP OF WATER:  - gabapentin (NEURONTIN) 300 MG capsule - rosuvastatin (CRESTOR) 10 MG tablet - omeprazole (PRILOSEC) 20 MG , (take one the night before and one on the morning of surgery - helps to prevent nausea after surgery.) - potassium chloride (KLOR-CON) 10 MEQ tablet  Stop taking Xarelto beginning 12/22/21, mat resume taking 1 day after your surgery.  Stop taking any of these medications beginning 12/17/21, One week prior to surgery: Stop Anti-inflammatories (NSAIDS) such as Advil, Aleve,  Ibuprofen, Motrin, Naproxen, Naprosyn and Aspirin based products such as Excedrin, Goodys Powder, BC Powder.  Stop taking beginning 12/17/21 ANY OVER THE COUNTER supplements until after surgery.   You may however, continue to take Tylenol if needed for pain up until the day of surgery.  No Alcohol for 24 hours before or after surgery.  No Smoking including e-cigarettes for 24 hours prior to surgery.  No chewable tobacco products for at least 6 hours prior to surgery.  No nicotine patches on the day of surgery.  Do not use any "recreational" drugs for at least a week prior to your surgery.  Please be advised that the combination of cocaine and anesthesia may have negative outcomes, up to and including death. If you test positive for cocaine, your surgery will be cancelled.  On the morning of surgery brush your teeth with toothpaste and water, you may rinse your mouth with mouthwash if you wish. Do not swallow any toothpaste or mouthwash.  Use CHG Soap or wipes as directed on instruction sheet.  Do not wear jewelry, make-up, hairpins, clips or nail polish.  Do not wear lotions, powders, or perfumes.   Do not shave body from the neck down 48 hours prior to surgery just in case you cut yourself which could leave a site for infection.  Also, freshly shaved skin may become irritated if using the CHG soap.  Contact lenses, hearing aids and dentures may not be worn into surgery.  Do not bring valuables to the hospital. [facility] is not  responsible for any missing/lost belongings or valuables.   Notify your doctor if there is any change in your medical condition (cold, fever, infection).  Wear comfortable clothing (specific to your surgery type) to the hospital.  After surgery, you can help prevent lung complications by doing breathing exercises.  Take deep breaths and cough every 1-2 hours. Your doctor may order a device called an Incentive Spirometer to help you take deep  breaths. When coughing or sneezing, hold a pillow firmly against your incision with both hands. This is called "splinting." Doing this helps protect your incision. It also decreases belly discomfort.  If you are being admitted to the hospital overnight, leave your suitcase in the car. After surgery it may be brought to your room.  If you are being discharged the day of surgery, you will not be allowed to drive home. You will need a responsible adult (18 years or older) to drive you home and stay with you that night.   If you are taking public transportation, you will need to have a responsible adult (18 years or older) with you. Please confirm with your physician that it is acceptable to use public transportation.   Please call the [place] Dept. at [phone] if you have any questions about these instructions.  Surgery Visitation Policy:  Patients undergoing a surgery or procedure may have two family members or support persons with them as long as the person is not COVID-19 positive or experiencing its symptoms.   Inpatient Visitation:    Visiting hours are 7 a.m. to 8 p.m. Up to four visitors are allowed at one time in a patient room, including children. The visitors may rotate out with other people during the day. One designated support person (adult) may remain overnight.

## 2021-12-13 NOTE — Progress Notes (Addendum)
  [facility] Perioperative Services: Pre-Admission/Anesthesia Testing  Abnormal Lab Notification   Date: 12/13/21  Name: Charlotte Glover MRN:   [id number]  Re: Abnormal labs noted during PAT appointment   Notified:  Provider Name Provider Role Notification Mode  Skip Estimable, MD Orthopedics (Surgeon) Routed and/or faxed via [place] and Notes:  ABNORMAL LAB VALUE(S): Lab Results  Component Value Date   K 3.3 (L) 12/13/2021   [name] is scheduled for an elective COMPUTER ASSISTED TOTAL KNEE ARTHROPLASTY (Right: Knee) on 12/25/2021. In review of her medication reconciliation, it is noted that the patient is taking prescribed diuretic medications (furosemide, metolazone, spironolactone). Please note, in efforts to promote a safe and effective anesthetic course, per current guidelines/standards set by the [facility] anesthesia team, the minimal acceptable K+ level for the patient to proceed with general anesthesia is 3.0 mmol/L.  Patient is currently taking K-Dur 10 mEq 3 times daily.  Wanted to make that you were aware of K+ level being on the lower end of normal. I know you generally like your levels more in the normal range. Trying to determine whether or not you would prefer further optimization prior to surgery?   Honor Loh, MSN, APRN, FNP-C, CEN [facility]  Peri-operative Services Nurse Practitioner Phone: [phone] Fax: [phone] 12/13/21 2:02 PM

## 2021-12-14 DIAGNOSIS — R7303 Prediabetes: Secondary | ICD-10-CM | POA: Insufficient documentation

## 2021-12-20 ENCOUNTER — Encounter: Payer: Self-pay | Admitting: Orthopedic Surgery

## 2021-12-20 NOTE — Progress Notes (Signed)
Perioperative Services  Pre-Admission/Anesthesia Testing Clinical Review  Date: 12/21/21  Patient Demographics:  Name: Marshae Azam DOB:   13-Aug-1927 MRN:   [id number]  Planned Surgical Procedure(s):    Case: [id number] Date/Time: 12/25/21 1117   Procedure: COMPUTER ASSISTED TOTAL KNEE ARTHROPLASTY (Right: Knee)   Anesthesia type: Choice   Pre-op diagnosis: PRIMARY OSTEOARTHRITIS OF RIGHT KNEE.   Location: [facility] OR ROOM 01 / [place] [facility]S FOR ANESTHESIA GROUP   Surgeons: Dereck Leep, MD   NOTE: Available PAT nursing documentation and vital signs have been reviewed. Clinical nursing staff has updated patient's PMH/PSHx, current medication list, and drug allergies/intolerances to ensure comprehensive history available to assist in medical decision making as it pertains to the aforementioned surgical procedure and anticipated anesthetic course. Extensive review of available clinical information performed. [place] PMH and PSHx updated with any diagnoses/procedures that  may have been inadvertently omitted during her intake with the pre-admission testing department's nursing staff.  Clinical Discussion:  Bonnita Newby is a 88 y.o. female who is submitted for pre-surgical anesthesia review and clearance prior to her undergoing the above procedure. Patient has never been a smoker. Pertinent PMH includes: CAD, NSTEMI, cardiac arrest, valvular heart disease, SSS/CHB (s/p CRT-P implant), HFpEF, cardiac murmur, atrial fibrillation, cardiac murmur, HTN, HLD, CKD-III, pulmonary hypertension, dyspnea, GERD (on daily PPI), hiatal hernia, jackhammer esophagus, peripheral edema, OA, RIGHT breast cancer (DCIS), meningioma.  Patient is followed by cardiology Saralyn Pilar, MD). She was last seen in the cardiology clinic on 10/11/2021; notes reviewed.  At the time of her clinic visit, patient reported that she was "doing great".  She denied any episodes of chest pain, however she had chronic  exertional dyspnea which she reported to be at baseline.  Patient with chronic peripheral edema (L >R).  She denied any PND, orthopnea, palpitations, vertiginous symptoms, or presyncope/syncope.  Patient with a past medical history significant for cardiovascular diagnoses.  Of note, records regarding patient's remote cardiovascular care unavailable at time of consult as she has received care while living in [place], [place].  Information gathered from patient report and notes from her local cardiologist.  Patient suffered an NSTEMI in 08/2009.  Other than the fact that the cardiovascular event was managed medically, details regarding this event unavailable for review.  Patient went into cardiac arrest while at home in 10/2009.  Event was witnessed by her husband who performed CPR.  ROSC was achieved in the field.  Patient transported to the hospital for further evaluation where she was subsequently admitted for work-up.  Etiology of cardiovascular event was undetermined, however it was felt to be secondary to "electrolyte abnormalities".  Cardiac MRI performed on 02/08/2012 revealing a low normal left ventricular systolic function with an EF of 50-55%.  There was moderate basal to mid inferior hypokinesis. RVSF normal. There was severe BAE.  (+) LGE noted in the basal-mid inferior and basal inferoseptal territories consistent with subendocardial MI in the distribution of the RCA. Patient with a significant history of refractory atrial dysrhythmias, for which she has undergone multiple procedures as follows:  03/24/2012 - PVI with RFA  04/02/2012 - DCCV with 200 J x 1 shock  01/02/2013 - repeat PVI with RFA adding roof line in addition to coronary sinus isolation and CFAE ablation  01/15/2013 - DCCV with 200 J x 1 shock  05/26/2014 - AV nodal ablation   Patient with a history of sick sinus syndrome/complete heart block.  PPM placed concurrently during her AV nodal ablation back in 05/2014.  She was  subsequently upgraded to a CRT-P device on 07/13/2016.  Patient underwent diagnostic right/left heart catheterization on 05/08/2016 revealing a mildly reduced left ventricular systolic function with global hypokinesis; LVEF 46%.  Coronary anatomy was normal with no evidence of obstructive CAD.  There was moderate to severe mitral valve regurgitation.  Hemodynamics findings consistent with pulmonary hypertension; mean PA 38 mmHg, AO saturation 97%, LVEDP 15 mmHg, RVSP 50 mmHg, cardiac output 3 L/min.  Last TTE was performed on 06/21/2020 revealing a normal left ventricular systolic function with EF >55%. Diastolic Doppler parameters consistent with abnormal relaxation (G1DD).  Right atrium is mildly enlarged.  Patient with moderate aortic, mitral, and tricuspid valve regurgitation.  There were no significant transvalvular gradients to suggest valvular stenosis.  Again, patient with a history of refractory atrial fibrillation; CHA2DS2-VASc Score = 6 (age x 2, sex, CHF, HTN, prior MI). Her rate and rhythm are currently being maintained without the use of pharmacological intervention. She is chronically anticoagulated using rivaroxaban; reported to be compliant with therapy with no evidence or reports of GI bleeding.  Blood pressure reasonably well controlled at 130/70 on currently prescribed diuretic therapies. She is on a statin for her HLD diagnosis and further ASCVD prevention.  Patient has a supply of SL NTG to use on a as needed basis for recurrent angina; denied recent use.  Again, patient with a CRT-P device in place, which is regularly interrogated by her primary cardiology team.  Patient is not diabetic.  Functional capacity limited by age-related debility, arthritides, and multiple medical comorbidities.  Patient questionably able to achieve 4 METS of activity without angina/anginal equivalent symptoms.  No changes were made to her medication regimen.  Patient to follow-up with outpatient cardiology in  3 months or sooner if needed.  [name] is scheduled for an elective RIGHT COMPUTER ASSISTED TOTAL KNEE ARTHROPLASTY on 12/25/2021 with Dr. Skip Estimable, MD.  Given patient's past medical history significant for cardiovascular diagnoses, presurgical cardiac clearance was sought by the PAT team. Per cardiology, "this patient is optimized for surgery and may proceed with the planned procedural course with a LOW to MODERATE risk of significant perioperative cardiovascular complications".  Again, this patient is on daily anticoagulation therapy.  She is instructed on recommendations from her cardiologist and surgeon for holding her rivaroxaban dose for 3 days prior to procedure with plans to restart as soon as postoperative bleeding respectively minimized by her primary attending surgeon.  The patient is aware that her last dose of rivaroxaban should be on 12/21/2021.  Patient denies previous perioperative complications with anesthesia in the past. In review of the available records, it is noted that patient underwent a general anesthetic course here at [facility] in 11/2020 without documented complications.      12/13/2021    8:20 AM 12/13/2020   10:42 AM 12/13/2020   10:32 AM  Vitals with BMI  Height '5\' 7"'$     Weight 163 lbs 13 oz    BMI 93.71    Systolic [phone]  Diastolic 58 70 66  Pulse 71 70 70    Providers/Specialists:   NOTE: Primary physician provider listed below. Patient may have been seen by APP or partner within same practice.   PROVIDER ROLE / SPECIALTY LAST OV  Hooten, Laurice Record, MD Orthopedics (Surgeon) 11/30/2021  Adin Hector, MD Primary Care Provider 12/15/2021  Isaias Cowman, MD Cardiology 10/11/2021  Jennings Books, MD Neurology 11/09/2020  Allergies:  Amiodarone, Baclofen, Metoprolol, Pantoprazole, and Tape  Current Home Medications:   No current facility-administered medications for this encounter.     Acetaminophen 500 MG coapsule   azelastine (ASTELIN) 0.1 % nasal spray   furosemide (LASIX) 40 MG tablet   gabapentin (NEURONTIN) 300 MG capsule   metolazone (ZAROXOLYN) 5 MG tablet   Multiple Vitamins-Minerals (PRESERVISION AREDS 2) CAPS   nitroGLYCERIN (NITROSTAT) 0.4 MG SL tablet   omeprazole (PRILOSEC) 20 MG capsule   Polyvinyl Alcohol-Povidone (REFRESH OP)   potassium chloride (KLOR-CON) 10 MEQ tablet   rivaroxaban (XARELTO) 20 MG TABS tablet   rosuvastatin (CRESTOR) 10 MG tablet   senna (SENOKOT) 8.6 MG TABS tablet   spironolactone (ALDACTONE) 25 MG tablet   traMADol (ULTRAM) 50 MG tablet   triamcinolone ointment (KENALOG) 0.1 %   History:   Past Medical History:  Diagnosis Date   (HFpEF) heart failure with preserved ejection fraction ([place]) 06/21/2020   a.) TTE 06/21/2020: EF >55%, RAE, G1DD   Amaurosis fugax of left eye    Arthritis    Atrial fibrillation ([place])    a.) Dx'd in 2009; b.) CHA2DS2-VASc = 6 (age x 2, sex, CHF, HTN, prior MI); c.) s/p PVI 09/31/2013; DCCV (200 J x 1) 04/02/2012, repeat PVI with roof line, coronary sinus isolation, and CFAE ablation 01/02/2013; DCCV 01/18/2013 (200 J x 1); AV nodal ablation 05/28/2014;  d.) rate/rhythm maintained without pharmacological intervention; chronically anticoagulated using rivaroxaban   Cardiac arrest ([place]) 10/2009   a.) witnessed arrest at home; husband performed CPR; ROSC achieved in the field. Admitted for workup --> etiology of CV event undetermined; felt to be secondary to "electrolyte abnormalities".   Cardiac murmur    CHB (complete heart block) ([place])    a.) s/p PPM 05/28/2014; b.) CRT-P upgrade 07/13/2016.   Chickenpox    CKD (chronic kidney disease), stage III (HCC)    Coronary artery disease    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/13/2013   a.) grade II; ER/PR (+)   Dyspnea    Edema    GERD (gastroesophageal reflux disease)    Granulomatous disease ([place])    a.) spleen   Hiatal hernia     HLD (hyperlipidemia)    HOH (hard of hearing)    Hypertension 01/23/2012   Jackhammer esophagus    Long term current use of antithrombotics/antiplatelets    a.) rivaroxaban   Melanoma of lower leg, left (HCC)    Meningioma, multiple ([place]) 07/14/2019   a.) MRI brain 07/14/2019: 1.2 x 1.3 x 1.3 cm midline posterior fossa meningioma inferior to the cerebellar vertex; 7 x 3 mm dural based meningioma along frontal convexity   NSTEMI (non-ST elevated myocardial infarction) ([place]) 08/2009   Presence of permanent cardiac pacemaker    Pulmonary HTN ([place]) 05/08/2016   a.) R/LHC 05/08/2016: EF >55, LVEDP 15, mean PA 38, AO sat 97, RVSP 50, CO 3 L/min   S/P ablation of atrial fibrillation    Shingles    SSS (sick sinus syndrome) ([place])    a.) CRT-P in place   Valvular heart disease 02/08/2012   a.) cMRI 02/08/2012: EF 50-55, mild-mod AR, mod MR; b.) TEE 02/26/2012: EF >55, mild MR/AR/TR; c.) R/LHC 05/08/2016: sev MR; d.) TEE 06/14/2016: EF >55, sev MR, mod AR/TR; e.) TTE 10/05/2016: EF >55, triv PR, mod AR/MR/TR; f.) TTE 04/22/2018: EF >55, mild MR/TR/PR, mod AR; g.) TTE 11/27/2019: EF 50, mild PR, mod AR/MR/TR; h.) TTE 06/21/2020: EF >55, triv PR, mod  AR/MR/TR   Wears hearing aid in both ears    Past Surgical History:  Procedure Laterality Date   ANTERIOR VITRECTOMY Left 03/20/2017   Procedure: ANTERIOR VITRECTOMY;  Surgeon: Leandrew Koyanagi, MD;  Location: [place];  Service: Ophthalmology;  Laterality: Left;  IVA TOPICAL LEFT   ARTERY BIOPSY Right 04/08/2019   Procedure: BIOPSY TEMPORAL ARTERY;  Surgeon: Algernon Huxley, MD;  Location: [facility] ORS;  Service: Vascular;  Laterality: Right;   ATRIAL FIBRILLATION ABLATION N/A 03/24/2012   Procedure: ATRIAL FIBRILLATION ABLATION (PVI); Location: [facility]   ATRIAL FIBRILLATION ABLATION N/A 01/02/2013   Procedure: ATRIAL FIBRILLATION ABLATION (PVI, roof line, coronary sinus siolation, CFAE ablation); Location: [facility]   AV NODE ABLATION N/A  05/26/2014   Procedure: AV NODE ABLATION; Location: [facility]   BIV PACEMAKER INSERTION CRT-P N/A 07/13/2016   Procedure: CRT-P BiV PACEMAKER UPGRADE; Location: [facility]   BREAST BIOPSY Right 2015   + for DCIS    BREAST SURGERY     CARDIAC CATHETERIZATION Bilateral 05/08/2016   Procedure: Right/Left Heart Cath and Coronary Angiography;  Surgeon: Isaias Cowman, MD;  Location: [place] [facility];  Service: Cardiovascular;  Laterality: Bilateral;   CARDIOVERSION N/A 04/02/2012   Procedure: CARDIOVERSION; Location: [facility]   CARDIOVERSION N/A 01/15/2013   Procedure: CARDIOVERSION; Location: [facility]   CATARACT EXTRACTION W/PHACO Left 03/20/2017   Procedure: IOL EXCHANGE;  Surgeon: Leandrew Koyanagi, MD;  Location: [place];  Service: Ophthalmology;  Laterality: Left;   CHOLECYSTECTOMY     COLONOSCOPY     patient reports several   DILATION AND CURETTAGE OF UTERUS     ESOPHAGOGASTRODUODENOSCOPY     ESOPHAGOGASTRODUODENOSCOPY (EGD) WITH PROPOFOL N/A 12/13/2020   Procedure: ESOPHAGOGASTRODUODENOSCOPY (EGD) WITH PROPOFOL;  Surgeon: Lesly Rubenstein, MD;  Location: [facility];  Service: Endoscopy;  Laterality: N/A;   PACEMAKER INSERTION N/A 05/28/2014   Procedure: PACEMAKER INSERTION (dual chamber); Location: [facility]   PERIPHERAL IRIDOTOMY Left 03/20/2017   Procedure: PERIPHERAL IRIDECTOMY;  Surgeon: Leandrew Koyanagi, MD;  Location: [place];  Service: Ophthalmology;  Laterality: Left;   ROTATOR CUFF REPAIR Left    TEE WITHOUT CARDIOVERSION N/A 05/16/2016   Procedure: TRANSESOPHAGEAL ECHOCARDIOGRAM (TEE);  Surgeon: Teodoro Spray, MD;  Location: [facility] ORS;  Service: Cardiovascular;  Laterality: N/A;   TONSILLECTOMY     uterine polyp     Family History  Problem Relation Age of Onset   Breast cancer Mother    Cancer Sister    Cancer Brother    Cancer Daughter    Breast cancer Daughter    Cancer Son    Social History   Tobacco Use   Smoking status: Never    Smokeless tobacco: Never  Vaping Use   Vaping Use: Never used  Substance Use Topics   Alcohol use: Yes    Alcohol/week: 7.0 standard drinks of alcohol    Types: 7 Shots of liquor per week    Comment:  (1 scotch daily)   Drug use: No    Pertinent Clinical Results:  LABS: Labs reviewed: Acceptable for surgery.  No visits with results within 3 Day(s) from this visit.  Latest known visit with results is:  Hospital Outpatient Visit on 12/13/2021  Component Date Value Ref Range Status   ABO/RH(D) 12/13/2021 A POS   Final   Antibody Screen 12/13/2021 NEG   Final   Sample Expiration 12/13/2021 12/27/2021,2359   Final   Extend sample reason 12/13/2021    Final  Value:NO TRANSFUSIONS OR PREGNANCY IN THE PAST 3 MONTHS Performed at [facility], [address] [place], [place] [address]    MRSA, PCR 12/13/2021 NEGATIVE  NEGATIVE Final   Staphylococcus aureus 12/13/2021 NEGATIVE  NEGATIVE Final   Comment: (NOTE) The Xpert SA Assay (FDA approved for NASAL specimens in patients 25 years of age and older), is one component of a comprehensive surveillance program. It is not intended to diagnose infection nor to guide or monitor treatment. Performed at [facility], [place], [place] [address]    CRP 12/13/2021 1.9 (H)  <1.0 mg/dL Final   Performed at [place] [address], [place], [place] [address]   Sed Rate 12/13/2021 46 (H)  0 - 30 mm/hr Final   Performed at [facility], [place], [place] [address]   WBC 12/13/2021 8.7  4.0 - 10.5 K/uL Final   RBC 12/13/2021 4.78  3.87 - 5.11 MIL/uL Final   Hemoglobin 12/13/2021 14.1  12.0 - 15.0 g/dL Final   HCT 12/13/2021 43.8  36.0 - 46.0 % Final   MCV 12/13/2021 91.6  80.0 - 100.0 fL Final   MCH 12/13/2021 29.5  26.0 - 34.0 pg Final   MCHC 12/13/2021 32.2  30.0 - 36.0 g/dL Final   RDW 12/13/2021 15.3  11.5 - 15.5 % Final   Platelets 12/13/2021 290  150 -  400 K/uL Final   nRBC 12/13/2021 0.0  0.0 - 0.2 % Final   Performed at [facility], [place], [place] [address]   Sodium 12/13/2021 135  135 - 145 mmol/L Final   Potassium 12/13/2021 3.3 (L)  3.5 - 5.1 mmol/L Final   Chloride 12/13/2021 90 (L)  98 - 111 mmol/L Final   CO2 12/13/2021 33 (H)  22 - 32 mmol/L Final   Glucose, Bld 12/13/2021 106 (H)  70 - 99 mg/dL Final   Glucose reference range applies only to samples taken after fasting for at least 8 hours.   BUN 12/13/2021 28 (H)  8 - 23 mg/dL Final   Creatinine, Ser 12/13/2021 1.22 (H)  0.44 - 1.00 mg/dL Final   Calcium 12/13/2021 9.5  8.9 - 10.3 mg/dL Final   Total Protein 12/13/2021 7.2  6.5 - 8.1 g/dL Final   Albumin 12/13/2021 3.6  3.5 - 5.0 g/dL Final   AST 12/13/2021 26  15 - 41 U/L Final   ALT 12/13/2021 42  0 - 44 U/L Final   Alkaline Phosphatase 12/13/2021 155 (H)  38 - 126 U/L Final   Total Bilirubin 12/13/2021 0.8  0.3 - 1.2 mg/dL Final   GFR, Estimated 12/13/2021 42 (L)  >60 mL/min Final   Comment: (NOTE) Calculated using the CKD-EPI Creatinine Equation (2021)    Anion gap 12/13/2021 12  5 - 15 Final   Performed at [facility], [place],  [address]   Color, Urine 12/13/2021 YELLOW (A)  YELLOW Final   APPearance 12/13/2021 CLOUDY (A)  CLEAR Final   Specific Gravity, Urine 12/13/2021 1.014  1.005 - 1.030 Final   pH 12/13/2021 5.0  5.0 - 8.0 Final   Glucose, UA 12/13/2021 NEGATIVE  NEGATIVE mg/dL Final   Hgb urine dipstick 12/13/2021 NEGATIVE  NEGATIVE Final   Bilirubin Urine 12/13/2021 NEGATIVE  NEGATIVE Final   Ketones, ur 12/13/2021 NEGATIVE  NEGATIVE mg/dL Final   Protein, ur 12/13/2021 NEGATIVE  NEGATIVE mg/dL Final   Nitrite 12/13/2021 NEGATIVE  NEGATIVE  Final   Leukocytes,Ua 12/13/2021 SMALL (A)  NEGATIVE Final   RBC / HPF 12/13/2021 0-5  0 - 5 RBC/hpf Final   WBC, UA 12/13/2021 6-10  0 - 5 WBC/hpf Final   Bacteria, UA 12/13/2021 RARE (A)  NONE SEEN Final    Squamous Epithelial / LPF 12/13/2021 >50 (H)  0 - 5 Final   Mucus 12/13/2021 PRESENT   Final   Hyaline Casts, UA 12/13/2021 PRESENT   Final   Performed at [facility], [address] [place], [place] [address]    ECG: Date: 10/11/2021 Rate: 70 bpm Rhythm: Ventricular paced rhythm Axis (leads I and aVF): Normal Intervals: QRS 138 ms. QTc 486 ms. ST segment and T wave changes: No evidence of acute ST segment elevation or depression Comparison: Similar to previous tracing obtained on 04/08/2019 NOTE: Tracing obtained at [facility]; unable for review. Above based on cardiologist's interpretation.    IMAGING / PROCEDURES: DIAGNOSTIC RADIOGRAPHS OF RIGHT KNEE 3 VIEWS performed on 11/30/2021 Significant narrowing of the lateral cartilage space with near bone-on-bone articulation and associated valgus alignment. Osteophyte formation is noted. Subchondral sclerosis is noted. No evidence of fracture or dislocation.  TRANSTHORACIC ECHOCARDIOGRAM performed on 06/21/2020 Normal left ventricular systolic function with an EF of >26% Diastolic Doppler parameters consistent with abnormal relaxation (G1DD). Mildly enlarged right atrium Trivial PR Moderate AR, MR, and TR Normal gradients; no valvular stenosis No pericardial effusion  MR BRAIN W WO CONTRAST performed on 07/14/2019 Small midline posterior fossa meningioma inferior to the cerebellar vermis; measures 1.2 x 1.3 x 1.3 cm Probable additional subcentimeter right frontal convexity meningioma; measured 7 x 3 mm  No mass effect or parenchymal edema. Mild chronic microvascular ischemic changes  Impression and Plan:  Ellarae Nevitt has been referred for pre-anesthesia review and clearance prior to her undergoing the planned anesthetic and procedural courses. Available labs, pertinent testing, and imaging results were personally reviewed by me. This patient has been appropriately cleared by cardiology with an  overall LOW to MODERATE risk of significant perioperative cardiovascular complications. Completed perioperative prescription for cardiac device management documentation completed by primary cardiology team and placed on patient's chart for review by the surgical/anesthetic team on the day of her procedure.   Based on clinical review performed today (12/21/21), barring any significant acute changes in the patient's overall condition, it is anticipated that she will be able to proceed with the planned surgical intervention. Any acute changes in clinical condition may necessitate her procedure being postponed and/or cancelled. Patient will meet with anesthesia team (MD and/or CRNA) on the day of her procedure for preoperative evaluation/assessment. Questions regarding anesthetic course will be fielded at that time.   Pre-surgical instructions were reviewed with the patient during her PAT appointment and questions were fielded by PAT clinical staff. Patient was advised that if any questions or concerns arise prior to her procedure then she should return a call to PAT and/or her surgeon's office to discuss.  Honor Loh, MSN, APRN, FNP-C, CEN [facility]  Peri-operative Services Nurse Practitioner Phone: [phone] Fax: [phone] 12/21/21 10:00 AM  NOTE: This note has been prepared using Dragon dictation software. Despite my best ability to proofread, there is always the potential that unintentional transcriptional errors may still occur from this process.

## 2021-12-21 ENCOUNTER — Encounter: Payer: Self-pay | Admitting: Orthopedic Surgery

## 2021-12-24 ENCOUNTER — Encounter: Payer: Self-pay | Admitting: Orthopedic Surgery

## 2021-12-24 NOTE — H&P (Signed)
ORTHOPAEDIC HISTORY & PHYSICAL Charlotte Glover, Florinda Marker., MD - 11/30/2021 11:30 AM EDT Formatting of this note is different from the original. Images from the original note were not included. Chief Complaint: Chief Complaint  Patient presents with  H&P- RT TKA   Reason for Visit: The patient is a 88 y.o. female who presents today with her husband for reevaluation of her right knee. She has a long history of progressive right knee pain. She localizes most of the pain along the lateral aspect of the knee. She reports some swelling, no locking, and some giving way of the knee. The pain is aggravated by any weight bearing. The knee pain limits the patient's ability to ambulate long distances. The patient has not appreciated any significant improvement despite activity modification, ambulatory aids, intraarticular corticosteroid injection, and viscosupplementation. She is unable to tolerate NSAIDs due to anticoagulation with Xarelto. She is using a cane for ambulation. The patient states that the knee pain has progressed to the point that it is significantly interfering with her activities of daily living.  Medications: Current Outpatient Medications  Medication Sig Dispense Refill  acetaminophen (TYLENOL) 500 mg capsule Take 1,000 mg by mouth nightly as needed.   ANTIOX #8/OM3/DHA/EPA/LUT/ZEAX (PRESERVISION AREDS 2, OMEGA-3, ORAL) Take 1 capsule by mouth 2 (two) times daily.   carboxymethylcellulose (REFRESH TEARS) 0.5 % ophthalmic solution Place 1-2 drops into both eyes as needed for Dry Eyes  FUROsemide (LASIX) 40 MG tablet Take 1 tablet (40 mg total) by mouth 2 (two) times daily 180 tablet 1  gabapentin (NEURONTIN) 300 MG capsule Take 1 capsule (300 mg total) by mouth 4 (four) times daily Takes 2 cap by mouth daily 360 capsule 3  metOLazone (ZAROXOLYN) 5 MG tablet Take 1 tablet (5 mg total) by mouth once daily 90 tablet 1  omeprazole (PRILOSEC) 40 MG DR capsule Take 1 capsule (40 mg total) by  mouth 2 (two) times daily before meals 180 capsule 3  potassium chloride (KLOR-CON) 10 mEq ER tablet Take 1 tablet (10 mEq total) by mouth 3 (three) times daily 270 tablet 3  rivaroxaban (XARELTO) 20 mg tablet Take 1 tablet (20 mg total) by mouth once daily 90 tablet 3  rosuvastatin (CRESTOR) 10 MG tablet Take 1 tablet (10 mg total) by mouth once daily 90 tablet 3  spironolactone (ALDACTONE) 25 MG tablet Take 1 tablet (25 mg total) by mouth 2 (two) times daily 180 tablet 3  traMADoL (ULTRAM) 50 mg tablet Take 1 tablet (50 mg total) by mouth every 6 (six) hours as needed for Pain 20 tablet 0  VITAMIN D3 ORAL Take 1 capsule by mouth once daily   No current facility-administered medications for this visit.   Allergies: Allergies  Allergen Reactions  Adhesive Tape-Silicones Rash  Amiodarone Other (See Comments)  Bradycardia and sinus pauses  Amiodarone Analogues Unknown  Baclofen Other (See Comments)  Double vision  Metoprolol Rash  Other Other (See Comments)  Tape causes a skin rash. ECG electrodes cause skin irritation when left on for prolonged period of time.  Pantoprazole Rash   Past Medical History: Past Medical History:  Diagnosis Date  A-fib (CMS-HCC)  Amaurosis fugax of left eye  Arthritis  Biventricular cardiac pacemaker in situ 01/11/2020  Medtronic CRT-P.  Breast cancer (CMS-HCC)  CHB (complete heart block) (CMS-HCC) 06/30/2016  Chickenpox  CKD (chronic kidney disease) stage 3, GFR 30-59 ml/min (CMS-HCC) 12/27/2017  Coronary atherosclerosis 11/27/2011  Dysrhythmias 03/24/2012  Edema 01/23/2012  Elective replacement indicated for cardiac pacemaker battery  at end of lifespan 01/11/2020  Battery reached ERI on 01/04/20  Encounter for care of pacemaker 08/15/2020  Encounter for monitoring sotalol therapy 01/13/2013  Restarted after second RFA 12/2012  GERD (gastroesophageal reflux disease)  H/O cardiac arrest 01/01/2013  H/O non-ST elevation myocardial infarction (NSTEMI)  01/01/2013  Hearing impairment 01/01/2013  Heart disease  Heart disease  History of MI (myocardial infarction)  Hyperlipemia 01/23/2012  Hyperlipidemia  Hypertension  Left arm pain 01/13/2013  After Arterial line placement during AF procedure 01/02/13  Melanoma of lower leg (CMS-HCC) 2015  Left  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 She has a long history of atrial ablation controlled with sotalol in the past but she is not as well controlled. She's tried taking extra doses of sotalol but did not convert. She was seen in the [facility] emergency room this past weekend. She's not very symptomatically. I would favor anticoagulation with rate control. She's nev  S/P ablation of atrial fibrillation 07/11/2012  Severe mitral regurgitation 05/29/2016  Shingles  Shortness of breath   Past Surgical History: Past Surgical History:  Procedure Laterality Date  Uterine polyps removed 2008  Left RCT Left 2009  CHOLECYSTECTOMY 2010  DILATION AND CURETTAGE OF UTERUS 2010  CHOLECYSTECTOMY 2011  ABLATION ARRYTHMIA FOCUS 2013  EGD 07/28/2012  No repeat needed  OTHER SURGERY Left 07/2013  Melanoma Removed from Left Lower Leg  EGD 12/13/2020  Normal EGD biopsies/Repeat as needed/CTL  BREAST SURGERY Right 1990s  INSERT / REPLACE / REMOVE PACEMAKER  TONSILLECTOMY  childhood   Social History: Social History   Socioeconomic History  Marital status: Married  Occupational History  Occupation: Retired  Tobacco Use  Smoking status: Never  Smokeless tobacco: Never  Vaping Use  Vaping Use: Never used  Substance and Sexual Activity  Alcohol use: Yes  Alcohol/week: 7.0 standard drinks  Types: 7 Shots of liquor per week  Comment: Patient typically consumes 1 drink/day.  Drug use: No  Sexual activity: Defer   Family History: Family  History  Problem Relation Age of Onset  Diabetes type II Father  Diabetes Father  Alcohol abuse Father  Liver disease Father  Breast cancer Mother 32  died 29 not cancer related  Asthma Mother  Arthritis Mother  Breast cancer Daughter 30  died breast cancer at 21 yrs.  Colon cancer Sister  Multiple myeloma Sister  Acute myelogenous leukemia Brother  Heart disease Brother  Melanoma Brother  Arthritis Brother  Breast cancer Other  Acute myelogenous leukemia Son  Arthritis Brother  Anesthesia problems Neg Hx  Malignant hyperthermia Neg Hx   Review of Systems: A comprehensive 14 point ROS was performed, reviewed, and the pertinent orthopaedic findings are documented in the HPI.  Exam BP (!) 144/80 (BP Location: Left upper arm, Patient Position: Sitting, BP Cuff Size: Adult)  Ht 175.3 cm (_0 )  Wt 74.8 kg (165 lb)  BMI 24.37 kg/m   General:  Well-developed, well-nourished female seen in no acute distress.  Antalgic gait. Valgus thrust to the right knee.  HEENT:  Atraumatic, normocephalic. Pupils are equal and reactive to light. Extraocular motion is intact. Sclera are clear. Oropharynx is clear with moist mucosa.  Neck:  Supple, nontender, and with good ROM. No thyromegaly, adenopathy, JVD, or carotid bruits.  Lungs:  Clear to auscultation bilaterally.  Cardiovascular:  Regular rate and rhythm. Normal S1, S2. No murmur. No  appreciable gallops or rubs. Peripheral pulses are palpable. No lower extremity edema. Homan`s test is negative.  Abdomen:  Soft, nontender, nondistended. Bowel sounds are present.  Extremities: Good strength, stability, and range of motion of the upper extremities. Good range of motion of the hips and ankles.  Right Knee: Soft tissue swelling: mild Effusion: minimal Erythema: none Crepitance: mild Tenderness: lateral Alignment: relative valgus Mediolateral laxity: lateral pseudolaxity Posterior sag: negative Patellar tracking: Good  tracking without evidence of subluxation or tilt Atrophy: No significant atrophy.  Quadriceps tone was fair to good. Range of motion: 0/5/105 degrees  Neurologic:  Awake, alert, and oriented.  Sensory function is intact to pinprick and light touch.  Motor strength is judged to be 5/5.  Motor coordination is within normal limits.  No apparent clonus. No tremor.   X-rays: I ordered and interpreted standing AP, lateral, and sunrise radiographs of the right knee that were obtained in the office today. There is significant narrowing of the lateral cartilage space with near bone-on-bone articulation and associated valgus alignment. Osteophyte formation is noted. Subchondral sclerosis is noted. No evidence of fracture or dislocation.   Impression: Degenerative arthrosis of the right knee  Plan:  Notes from Dr. Saralyn Pilar were reviewed. The findings were discussed in detail with the patient. The patient was given informational material on total knee replacement. Conservative treatment options were reviewed with the patient. We discussed the risks and benefits of surgical intervention. The usual perioperative course was also discussed in detail. The patient expressed understanding of the risks and benefits of surgical intervention and would like to proceed with plans for right total knee arthroplasty.  She should stop the Xarelto 3 days prior to surgery.  I spent a total of 40 minutes in both face-to-face and non-face-to-face activities for this visit on the date of this encounter.  MEDICAL CLEARANCE: Per anesthesiology. Dr. Saralyn Pilar (Cardiology) has optimized the patient from a cardiac standpoint. ACTIVITY: As tolerated. WORK STATUS: Not applicable. THERAPY: Preoperative physical therapy evaluation. MEDICATIONS: Requested Prescriptions   No prescriptions requested or ordered in this encounter   FOLLOW-UP: Return for postoperative follow-up.  Ismael Treptow P. Holley Bouche., M.D.  This note  was generated in part with voice recognition software and I apologize for any typographical errors that were not detected and corrected. Electronically signed by Lamar Benes., MD at 11/30/2021 2:27 PM EDT

## 2021-12-25 ENCOUNTER — Inpatient Hospital Stay: Payer: Medicare Other | Admitting: Urgent Care

## 2021-12-25 ENCOUNTER — Other Ambulatory Visit: Payer: Self-pay

## 2021-12-25 ENCOUNTER — Observation Stay
Admission: RE | Admit: 2021-12-25 | Discharge: 2021-12-26 | Disposition: A | Discharge status: Home Health | Payer: Medicare Other | Source: Ambulatory Visit | Attending: Orthopedic Surgery | Admitting: Orthopedic Surgery

## 2021-12-25 ENCOUNTER — Encounter
Admission: RE | Disposition: A | Discharge status: Home Health | Payer: Self-pay | Source: Ambulatory Visit | Attending: Orthopedic Surgery

## 2021-12-25 ENCOUNTER — Observation Stay: Payer: Medicare Other

## 2021-12-25 DIAGNOSIS — Z853 Personal history of malignant neoplasm of breast: Secondary | ICD-10-CM | POA: Diagnosis not present

## 2021-12-25 DIAGNOSIS — N183 Chronic kidney disease, stage 3 unspecified: Secondary | ICD-10-CM | POA: Diagnosis not present

## 2021-12-25 DIAGNOSIS — Z950 Presence of cardiac pacemaker: Secondary | ICD-10-CM | POA: Insufficient documentation

## 2021-12-25 DIAGNOSIS — I130 Hypertensive heart and chronic kidney disease with heart failure and stage 1 through stage 4 chronic kidney disease, or unspecified chronic kidney disease: Secondary | ICD-10-CM | POA: Insufficient documentation

## 2021-12-25 DIAGNOSIS — I5030 Unspecified diastolic (congestive) heart failure: Secondary | ICD-10-CM | POA: Insufficient documentation

## 2021-12-25 DIAGNOSIS — I2510 Atherosclerotic heart disease of native coronary artery without angina pectoris: Secondary | ICD-10-CM | POA: Diagnosis not present

## 2021-12-25 DIAGNOSIS — Z79899 Other long term (current) drug therapy: Secondary | ICD-10-CM | POA: Insufficient documentation

## 2021-12-25 DIAGNOSIS — M1711 Unilateral primary osteoarthritis, right knee: Secondary | ICD-10-CM | POA: Diagnosis present

## 2021-12-25 DIAGNOSIS — R600 Localized edema: Secondary | ICD-10-CM

## 2021-12-25 DIAGNOSIS — I252 Old myocardial infarction: Secondary | ICD-10-CM | POA: Insufficient documentation

## 2021-12-25 DIAGNOSIS — Z96659 Presence of unspecified artificial knee joint: Secondary | ICD-10-CM

## 2021-12-25 DIAGNOSIS — Z7952 Long term (current) use of systemic steroids: Secondary | ICD-10-CM

## 2021-12-25 HISTORY — PX: KNEE ARTHROPLASTY: SHX992

## 2021-12-25 HISTORY — DX: Cardiac murmur, unspecified: R01.1

## 2021-12-25 HISTORY — DX: Diaphragmatic hernia without obstruction or gangrene: K44.9

## 2021-12-25 HISTORY — DX: Amaurosis fugax: G45.3

## 2021-12-25 HISTORY — DX: Malignant melanoma of left lower limb, including hip: C43.72

## 2021-12-25 HISTORY — DX: Dyskinesia of esophagus: K22.4

## 2021-12-25 HISTORY — DX: Presence of external hearing-aid: Z97.4

## 2021-12-25 HISTORY — DX: Sick sinus syndrome: I49.5

## 2021-12-25 HISTORY — DX: Long term (current) use of antithrombotics/antiplatelets: Z79.02

## 2021-12-25 HISTORY — DX: Chronic kidney disease, stage 3 unspecified: N18.30

## 2021-12-25 HISTORY — DX: Atrioventricular block, complete: I44.2

## 2021-12-25 HISTORY — DX: Functional disorders of polymorphonuclear neutrophils: D71

## 2021-12-25 HISTORY — DX: Hyperlipidemia, unspecified: E78.5

## 2021-12-25 LAB — POCT I-STAT, CHEM 8
BUN: 29 mg/dL — ABNORMAL HIGH (ref 8–23)
Calcium, Ion: 1.14 mmol/L — ABNORMAL LOW (ref 1.15–1.40)
Chloride: 98 mmol/L (ref 98–111)
Creatinine, Ser: 1.4 mg/dL — ABNORMAL HIGH (ref 0.44–1.00)
Glucose, Bld: 74 mg/dL (ref 70–99)
HCT: 43 % (ref 36.0–46.0)
Hemoglobin: 14.6 g/dL (ref 12.0–15.0)
Potassium: 3.5 mmol/L (ref 3.5–5.1)
Sodium: 136 mmol/L (ref 135–145)
TCO2: 27 mmol/L (ref 22–32)

## 2021-12-25 SURGERY — ARTHROPLASTY, KNEE, TOTAL, USING IMAGELESS COMPUTER-ASSISTED NAVIGATION
Anesthesia: General | Site: Knee | Laterality: Right

## 2021-12-25 MED ORDER — DIPHENHYDRAMINE HCL 12.5 MG/5ML PO ELIX: 12.5000 mg | ORAL_SOLUTION | ORAL | Status: DC | PRN

## 2021-12-25 MED ORDER — SURGIPHOR WOUND IRRIGATION SYSTEM - OPTIME
TOPICAL | Status: DC | PRN
  Administered 2021-12-25: 450 mL via TOPICAL

## 2021-12-25 MED ORDER — EPHEDRINE 5 MG/ML INJ
INTRAVENOUS | Status: AC
  Filled 2021-12-25: qty 5

## 2021-12-25 MED ORDER — CEFAZOLIN SODIUM-DEXTROSE 2-4 GM/100ML-% IV SOLN
2.0000 g | INTRAVENOUS | Status: AC
  Administered 2021-12-25: 2 g via INTRAVENOUS

## 2021-12-25 MED ORDER — PHENYLEPHRINE HCL-NACL 20-0.9 MG/250ML-% IV SOLN
INTRAVENOUS | Status: DC | PRN
  Administered 2021-12-25: 40 ug/min via INTRAVENOUS

## 2021-12-25 MED ORDER — GABAPENTIN 300 MG PO CAPS
ORAL_CAPSULE | ORAL | Status: AC
  Filled 2021-12-25: qty 1

## 2021-12-25 MED ORDER — EPHEDRINE SULFATE (PRESSORS) 50 MG/ML IJ SOLN
INTRAMUSCULAR | Status: DC | PRN
  Administered 2021-12-25: 10 mg via INTRAVENOUS

## 2021-12-25 MED ORDER — ORAL CARE MOUTH RINSE: 15.0000 mL | Freq: Once | OROMUCOSAL | Status: AC

## 2021-12-25 MED ORDER — RIVAROXABAN 15 MG PO TABS
15.0000 mg | ORAL_TABLET | Freq: Every day | ORAL | Status: DC
  Administered 2021-12-26: 15 mg via ORAL
  Filled 2021-12-25: qty 1

## 2021-12-25 MED ORDER — SENNOSIDES-DOCUSATE SODIUM 8.6-50 MG PO TABS
1.0000 | ORAL_TABLET | Freq: Two times a day (BID) | ORAL | Status: DC
  Administered 2021-12-25 – 2021-12-26 (×2): 1 via ORAL
  Filled 2021-12-25 (×2): qty 1

## 2021-12-25 MED ORDER — SODIUM CHLORIDE 0.9 % IV SOLN
INTRAVENOUS | Status: DC | PRN
  Administered 2021-12-25: 60 mL

## 2021-12-25 MED ORDER — ONDANSETRON HCL 4 MG/2ML IJ SOLN
INTRAMUSCULAR | Status: DC | PRN
  Administered 2021-12-25: 4 mg via INTRAVENOUS

## 2021-12-25 MED ORDER — ACETAMINOPHEN 10 MG/ML IV SOLN: 1000.0000 mg | Freq: Once | INTRAVENOUS | Status: DC | PRN

## 2021-12-25 MED ORDER — OXYCODONE HCL 5 MG PO TABS: 10.0000 mg | ORAL_TABLET | ORAL | Status: DC | PRN

## 2021-12-25 MED ORDER — CHLORHEXIDINE GLUCONATE 0.12 % MT SOLN
OROMUCOSAL | Status: AC
  Administered 2021-12-25: 15 mL via OROMUCOSAL
  Filled 2021-12-25: qty 15

## 2021-12-25 MED ORDER — POTASSIUM CHLORIDE CRYS ER 10 MEQ PO TBCR
10.0000 meq | EXTENDED_RELEASE_TABLET | Freq: Three times a day (TID) | ORAL | Status: DC
  Administered 2021-12-25 – 2021-12-26 (×2): 10 meq via ORAL
  Filled 2021-12-25 (×2): qty 1

## 2021-12-25 MED ORDER — CEFAZOLIN SODIUM-DEXTROSE 2-4 GM/100ML-% IV SOLN
INTRAVENOUS | Status: AC
  Filled 2021-12-25: qty 100

## 2021-12-25 MED ORDER — BUPIVACAINE HCL (PF) 0.5 % IJ SOLN
INTRAMUSCULAR | Status: DC | PRN
  Administered 2021-12-25: 3 mL

## 2021-12-25 MED ORDER — CELECOXIB 200 MG PO CAPS: 400.0000 mg | ORAL_CAPSULE | Freq: Once | ORAL | Status: AC

## 2021-12-25 MED ORDER — POLYVINYL ALCOHOL 1.4 % OP SOLN: 1.0000 [drp] | Freq: Every day | OPHTHALMIC | Status: DC | PRN

## 2021-12-25 MED ORDER — TRANEXAMIC ACID-NACL 1000-0.7 MG/100ML-% IV SOLN
INTRAVENOUS | Status: AC
  Filled 2021-12-25: qty 100

## 2021-12-25 MED ORDER — NEOMYCIN-POLYMYXIN B GU 40-200000 IR SOLN
Status: DC | PRN
  Administered 2021-12-25: 14 mL

## 2021-12-25 MED ORDER — 0.9 % SODIUM CHLORIDE (POUR BTL) OPTIME
TOPICAL | Status: DC | PRN
  Administered 2021-12-25: 500 mL

## 2021-12-25 MED ORDER — FLEET ENEMA 7-19 GM/118ML RE ENEM: 1.0000 | ENEMA | Freq: Once | RECTAL | Status: DC | PRN

## 2021-12-25 MED ORDER — ONDANSETRON HCL 4 MG/2ML IJ SOLN: 4.0000 mg | Freq: Four times a day (QID) | INTRAMUSCULAR | Status: DC | PRN

## 2021-12-25 MED ORDER — FENTANYL CITRATE (PF) 100 MCG/2ML IJ SOLN: 25.0000 ug | INTRAMUSCULAR | Status: DC | PRN

## 2021-12-25 MED ORDER — CHLORHEXIDINE GLUCONATE 4 % EX LIQD
60.0000 mL | Freq: Once | CUTANEOUS | Status: AC
  Administered 2021-12-25: 4 via TOPICAL

## 2021-12-25 MED ORDER — MENTHOL 3 MG MT LOZG: 1.0000 | LOZENGE | OROMUCOSAL | Status: DC | PRN

## 2021-12-25 MED ORDER — MAGNESIUM HYDROXIDE 400 MG/5ML PO SUSP
30.0000 mL | Freq: Every day | ORAL | Status: DC
Start: 2021-12-25 — End: 2021-12-26
  Administered 2021-12-25: 30 mL via ORAL
  Filled 2021-12-25 (×2): qty 30

## 2021-12-25 MED ORDER — GABAPENTIN 300 MG PO CAPS: 300.0000 mg | ORAL_CAPSULE | Freq: Once | ORAL | Status: DC

## 2021-12-25 MED ORDER — BUPIVACAINE HCL (PF) 0.25 % IJ SOLN
INTRAMUSCULAR | Status: DC | PRN
  Administered 2021-12-25: 60 mL

## 2021-12-25 MED ORDER — NITROGLYCERIN 0.4 MG SL SUBL
0.4000 mg | SUBLINGUAL_TABLET | SUBLINGUAL | Status: DC | PRN
Start: 2021-12-25 — End: 2021-12-26

## 2021-12-25 MED ORDER — LACTATED RINGERS IV SOLN: INTRAVENOUS | Status: DC

## 2021-12-25 MED ORDER — TRAMADOL HCL 50 MG PO TABS
50.0000 mg | ORAL_TABLET | ORAL | Status: DC | PRN
  Administered 2021-12-26: 50 mg via ORAL
  Filled 2021-12-25: qty 1

## 2021-12-25 MED ORDER — ALUM & MAG HYDROXIDE-SIMETH 200-200-20 MG/5ML PO SUSP
30.0000 mL | ORAL | Status: DC | PRN
Start: 2021-12-25 — End: 2021-12-26

## 2021-12-25 MED ORDER — BUPIVACAINE HCL (PF) 0.5 % IJ SOLN
INTRAMUSCULAR | Status: AC
  Filled 2021-12-25: qty 10

## 2021-12-25 MED ORDER — CELECOXIB 200 MG PO CAPS
200.0000 mg | ORAL_CAPSULE | Freq: Two times a day (BID) | ORAL | Status: DC
  Administered 2021-12-25 – 2021-12-26 (×2): 200 mg via ORAL
  Filled 2021-12-25 (×2): qty 1

## 2021-12-25 MED ORDER — OXYCODONE HCL 5 MG PO TABS: 5.0000 mg | ORAL_TABLET | Freq: Once | ORAL | Status: DC | PRN

## 2021-12-25 MED ORDER — ACETAMINOPHEN 325 MG PO TABS: 325.0000 mg | ORAL_TABLET | Freq: Four times a day (QID) | ORAL | Status: DC | PRN

## 2021-12-25 MED ORDER — CELECOXIB 200 MG PO CAPS
ORAL_CAPSULE | ORAL | Status: AC
  Administered 2021-12-25: 400 mg via ORAL
  Filled 2021-12-25: qty 2

## 2021-12-25 MED ORDER — TRANEXAMIC ACID-NACL 1000-0.7 MG/100ML-% IV SOLN
INTRAVENOUS | Status: AC
  Administered 2021-12-25: 1000 mg via INTRAVENOUS
  Filled 2021-12-25: qty 100

## 2021-12-25 MED ORDER — SPIRONOLACTONE 25 MG PO TABS
25.0000 mg | ORAL_TABLET | Freq: Every day | ORAL | Status: DC
  Administered 2021-12-26: 25 mg via ORAL
  Filled 2021-12-25 (×3): qty 1

## 2021-12-25 MED ORDER — ONDANSETRON HCL 4 MG/2ML IJ SOLN: 4.0000 mg | Freq: Once | INTRAMUSCULAR | Status: DC | PRN

## 2021-12-25 MED ORDER — HYDROMORPHONE HCL 1 MG/ML IJ SOLN: 0.5000 mg | INTRAMUSCULAR | Status: DC | PRN

## 2021-12-25 MED ORDER — FUROSEMIDE 40 MG PO TABS
40.0000 mg | ORAL_TABLET | Freq: Two times a day (BID) | ORAL | Status: DC
  Administered 2021-12-26: 40 mg via ORAL
  Filled 2021-12-25: qty 1

## 2021-12-25 MED ORDER — ACETAMINOPHEN 10 MG/ML IV SOLN
1000.0000 mg | Freq: Four times a day (QID) | INTRAVENOUS | Status: DC
  Administered 2021-12-25 – 2021-12-26 (×2): 1000 mg via INTRAVENOUS
  Filled 2021-12-25 (×2): qty 100

## 2021-12-25 MED ORDER — GABAPENTIN 300 MG PO CAPS
300.0000 mg | ORAL_CAPSULE | Freq: Three times a day (TID) | ORAL | Status: DC
  Administered 2021-12-25 – 2021-12-26 (×2): 300 mg via ORAL
  Filled 2021-12-25 (×2): qty 1

## 2021-12-25 MED ORDER — CHLORHEXIDINE GLUCONATE 0.12 % MT SOLN: 15.0000 mL | Freq: Once | OROMUCOSAL | Status: AC

## 2021-12-25 MED ORDER — SODIUM CHLORIDE 0.9 % IR SOLN
Status: DC | PRN
  Administered 2021-12-25: 3000 mL

## 2021-12-25 MED ORDER — AZELASTINE HCL 0.1 % NA SOLN
1.0000 | NASAL | Status: DC | PRN
Start: 2021-12-25 — End: 2021-12-26

## 2021-12-25 MED ORDER — OXYCODONE HCL 5 MG PO TABS: 5.0000 mg | ORAL_TABLET | ORAL | Status: DC | PRN

## 2021-12-25 MED ORDER — CEFAZOLIN SODIUM-DEXTROSE 2-4 GM/100ML-% IV SOLN
2.0000 g | Freq: Four times a day (QID) | INTRAVENOUS | Status: AC
  Administered 2021-12-25 – 2021-12-26 (×2): 2 g via INTRAVENOUS
  Filled 2021-12-25 (×2): qty 100

## 2021-12-25 MED ORDER — MIDAZOLAM HCL 5 MG/5ML IJ SOLN
INTRAMUSCULAR | Status: DC | PRN
  Administered 2021-12-25: 2 mg via INTRAVENOUS

## 2021-12-25 MED ORDER — METOCLOPRAMIDE HCL 5 MG PO TABS
10.0000 mg | ORAL_TABLET | Freq: Three times a day (TID) | ORAL | Status: DC
  Filled 2021-12-25 (×2): qty 2

## 2021-12-25 MED ORDER — PROPOFOL 1000 MG/100ML IV EMUL
INTRAVENOUS | Status: AC
  Filled 2021-12-25: qty 100

## 2021-12-25 MED ORDER — PROPOFOL 500 MG/50ML IV EMUL
INTRAVENOUS | Status: DC | PRN
  Administered 2021-12-25: 25 ug/kg/min via INTRAVENOUS

## 2021-12-25 MED ORDER — SODIUM CHLORIDE 0.9 % IV SOLN: INTRAVENOUS | Status: DC

## 2021-12-25 MED ORDER — FERROUS SULFATE 325 (65 FE) MG PO TABS
325.0000 mg | ORAL_TABLET | Freq: Two times a day (BID) | ORAL | Status: DC
Start: 2021-12-26 — End: 2021-12-26
  Administered 2021-12-26: 325 mg via ORAL
  Filled 2021-12-25: qty 1

## 2021-12-25 MED ORDER — PHENYLEPHRINE 80 MCG/ML (10ML) SYRINGE FOR IV PUSH (FOR BLOOD PRESSURE SUPPORT)
PREFILLED_SYRINGE | INTRAVENOUS | Status: AC
  Filled 2021-12-25: qty 10

## 2021-12-25 MED ORDER — ENSURE PRE-SURGERY PO LIQD
296.0000 mL | Freq: Once | ORAL | Status: AC
  Administered 2021-12-25: 296 mL via ORAL
  Filled 2021-12-25: qty 296

## 2021-12-25 MED ORDER — DEXAMETHASONE SODIUM PHOSPHATE 10 MG/ML IJ SOLN
INTRAMUSCULAR | Status: AC
  Administered 2021-12-25: 8 mg via INTRAVENOUS
  Filled 2021-12-25: qty 1

## 2021-12-25 MED ORDER — BISACODYL 10 MG RE SUPP: 10.0000 mg | Freq: Every day | RECTAL | Status: DC | PRN

## 2021-12-25 MED ORDER — PANTOPRAZOLE SODIUM 40 MG PO TBEC
40.0000 mg | DELAYED_RELEASE_TABLET | Freq: Two times a day (BID) | ORAL | Status: DC
  Administered 2021-12-25 – 2021-12-26 (×2): 40 mg via ORAL
  Filled 2021-12-25 (×2): qty 1

## 2021-12-25 MED ORDER — ACETAMINOPHEN 10 MG/ML IV SOLN
INTRAVENOUS | Status: DC | PRN
  Administered 2021-12-25: 1000 mg via INTRAVENOUS

## 2021-12-25 MED ORDER — DEXAMETHASONE SODIUM PHOSPHATE 10 MG/ML IJ SOLN
8.0000 mg | Freq: Once | INTRAMUSCULAR | Status: AC
Start: 2021-12-25 — End: 2021-12-25

## 2021-12-25 MED ORDER — TRANEXAMIC ACID-NACL 1000-0.7 MG/100ML-% IV SOLN
1000.0000 mg | INTRAVENOUS | Status: AC
  Administered 2021-12-25: 1000 mg via INTRAVENOUS

## 2021-12-25 MED ORDER — ONDANSETRON HCL 4 MG PO TABS: 4.0000 mg | ORAL_TABLET | Freq: Four times a day (QID) | ORAL | Status: DC | PRN

## 2021-12-25 MED ORDER — ROSUVASTATIN CALCIUM 10 MG PO TABS
10.0000 mg | ORAL_TABLET | Freq: Every day | ORAL | Status: DC
  Administered 2021-12-26: 10 mg via ORAL
  Filled 2021-12-25: qty 1

## 2021-12-25 MED ORDER — OCUVITE-LUTEIN PO CAPS
1.0000 | ORAL_CAPSULE | Freq: Two times a day (BID) | ORAL | Status: DC
  Administered 2021-12-26: 1 via ORAL
  Filled 2021-12-25 (×2): qty 1

## 2021-12-25 MED ORDER — MIDAZOLAM HCL 2 MG/2ML IJ SOLN
INTRAMUSCULAR | Status: AC
  Filled 2021-12-25: qty 2

## 2021-12-25 MED ORDER — OXYCODONE HCL 5 MG/5ML PO SOLN: 5.0000 mg | Freq: Once | ORAL | Status: DC | PRN

## 2021-12-25 MED ORDER — TRANEXAMIC ACID-NACL 1000-0.7 MG/100ML-% IV SOLN: 1000.0000 mg | Freq: Once | INTRAVENOUS | Status: AC

## 2021-12-25 MED ORDER — PHENOL 1.4 % MT LIQD: 1.0000 | OROMUCOSAL | Status: DC | PRN

## 2021-12-25 MED ORDER — METOLAZONE 5 MG PO TABS: 5.0000 mg | ORAL_TABLET | ORAL | Status: DC

## 2021-12-25 MED ORDER — DEXMEDETOMIDINE (PRECEDEX) IN NS 20 MCG/5ML (4 MCG/ML) IV SYRINGE
PREFILLED_SYRINGE | INTRAVENOUS | Status: DC | PRN
  Administered 2021-12-25: 12 ug via INTRAVENOUS

## 2021-12-25 SURGICAL SUPPLY — 73 items
ATTUNE MED DOME PAT 38 KNEE (Knees) ×1 IMPLANT
ATTUNE PSFEM RTSZ6 NARCEM KNEE (Femur) ×1 IMPLANT
ATTUNE PSRP INSR SZ6 5 KNEE (Insert) ×1 IMPLANT
BASE TIBIA ATTUNE KNEE SYS SZ6 (Knees) IMPLANT
BATTERY INSTRU NAVIGATION (MISCELLANEOUS) ×8 IMPLANT
BLADE SAW 70X12.5 (BLADE) ×2 IMPLANT
BLADE SAW 90X13X1.19 OSCILLAT (BLADE) ×2 IMPLANT
BLADE SAW 90X25X1.19 OSCILLAT (BLADE) ×2 IMPLANT
BONE CEMENT GENTAMICIN (Cement) ×4 IMPLANT
CEMENT BONE GENTAMICIN 40 (Cement) IMPLANT
COOLER POLAR GLACIER W/PUMP (MISCELLANEOUS) ×2 IMPLANT
CUFF TOURN SGL QUICK 24 (TOURNIQUET CUFF)
CUFF TOURN SGL QUICK 34 (TOURNIQUET CUFF)
CUFF TRNQT CYL 24X4X16.5-23 (TOURNIQUET CUFF) IMPLANT
CUFF TRNQT CYL 34X4.125X (TOURNIQUET CUFF) IMPLANT
DRAPE 3/4 80X56 (DRAPES) ×2 IMPLANT
DRAPE INCISE IOBAN 66X45 STRL (DRAPES) IMPLANT
DRSG DERMACEA 8X12 NADH (GAUZE/BANDAGES/DRESSINGS) ×1 IMPLANT
DRSG DERMACEA NONADH 3X8 (GAUZE/BANDAGES/DRESSINGS) ×2 IMPLANT
DRSG MEPILEX SACRM 8.7X9.8 (GAUZE/BANDAGES/DRESSINGS) ×2 IMPLANT
DRSG OPSITE POSTOP 4X14 (GAUZE/BANDAGES/DRESSINGS) ×2 IMPLANT
DRSG TEGADERM 4X4.75 (GAUZE/BANDAGES/DRESSINGS) ×2 IMPLANT
DURAPREP 26ML APPLICATOR (WOUND CARE) ×4 IMPLANT
ELECT CAUTERY BLADE 6.4 (BLADE) ×2 IMPLANT
ELECT REM PT RETURN 9FT ADLT (ELECTROSURGICAL) ×2
ELECTRODE REM PT RTRN 9FT ADLT (ELECTROSURGICAL) ×1 IMPLANT
EX-PIN ORTHOLOCK NAV 4X150 (PIN) ×4 IMPLANT
GLOVE BIOGEL M STRL SZ7.5 (GLOVE) ×8 IMPLANT
GLOVE BIOGEL PI IND STRL 8 (GLOVE) ×1 IMPLANT
GLOVE BIOGEL PI INDICATOR 8 (GLOVE) ×1
GLOVE SURG UNDER POLY LF SZ7.5 (GLOVE) ×2 IMPLANT
GOWN STRL REUS W/ TWL LRG LVL3 (GOWN DISPOSABLE) ×2 IMPLANT
GOWN STRL REUS W/ TWL XL LVL3 (GOWN DISPOSABLE) ×1 IMPLANT
GOWN STRL REUS W/TWL LRG LVL3 (GOWN DISPOSABLE) ×2
GOWN STRL REUS W/TWL XL LVL3 (GOWN DISPOSABLE) ×1
HEMOVAC 400CC 10FR (MISCELLANEOUS) ×2 IMPLANT
HOLDER FOLEY CATH W/STRAP (MISCELLANEOUS) ×2 IMPLANT
HOLSTER ELECTROSUGICAL PENCIL (MISCELLANEOUS) ×2 IMPLANT
HOOD PEEL AWAY FLYTE STAYCOOL (MISCELLANEOUS) ×4 IMPLANT
IV NS IRRIG 3000ML ARTHROMATIC (IV SOLUTION) ×2 IMPLANT
KIT TURNOVER KIT A (KITS) ×2 IMPLANT
KNIFE SCULPS 14X20 (INSTRUMENTS) ×2 IMPLANT
MANIFOLD NEPTUNE II (INSTRUMENTS) ×4 IMPLANT
NDL SPNL 20GX3.5 QUINCKE YW (NEEDLE) ×2 IMPLANT
NEEDLE SPNL 20GX3.5 QUINCKE YW (NEEDLE) ×4 IMPLANT
NS IRRIG 500ML POUR BTL (IV SOLUTION) ×2 IMPLANT
PACK TOTAL KNEE (MISCELLANEOUS) ×2 IMPLANT
PAD ABD DERMACEA PRESS 5X9 (GAUZE/BANDAGES/DRESSINGS) ×4 IMPLANT
PAD WRAPON POLAR KNEE (MISCELLANEOUS) ×1 IMPLANT
PIN DRILL FIX HALF THREAD (BIT) ×4 IMPLANT
PIN DRILL QUICK PACK ×4 IMPLANT
PIN FIXATION 1/8DIA X 3INL (PIN) ×2 IMPLANT
PULSAVAC PLUS IRRIG FAN TIP (DISPOSABLE) ×2
SOL PREP PVP 2OZ (MISCELLANEOUS) ×2
SOLUTION IRRIG SURGIPHOR (IV SOLUTION) ×2 IMPLANT
SOLUTION PREP PVP 2OZ (MISCELLANEOUS) ×1 IMPLANT
SPONGE DRAIN TRACH 4X4 STRL 2S (GAUZE/BANDAGES/DRESSINGS) ×2 IMPLANT
STAPLER SKIN PROX 35W (STAPLE) ×2 IMPLANT
STOCKINETTE IMPERV 14X48 (MISCELLANEOUS) ×1 IMPLANT
STRAP TIBIA SHORT (MISCELLANEOUS) ×2 IMPLANT
SUCTION FRAZIER HANDLE 10FR (MISCELLANEOUS) ×1
SUCTION TUBE FRAZIER 10FR DISP (MISCELLANEOUS) ×1 IMPLANT
SUT VIC AB 0 CT1 36 (SUTURE) ×4 IMPLANT
SUT VIC AB 1 CT1 36 (SUTURE) ×4 IMPLANT
SUT VIC AB 2-0 CT2 27 (SUTURE) ×2 IMPLANT
SYR 30ML LL (SYRINGE) ×4 IMPLANT
TIBIA ATTUNE KNEE SYS BASE SZ6 (Knees) ×2 IMPLANT
TIP FAN IRRIG PULSAVAC PLUS (DISPOSABLE) ×1 IMPLANT
TOWEL OR 17X26 4PK STRL BLUE (TOWEL DISPOSABLE) IMPLANT
TOWER CARTRIDGE SMART MIX (DISPOSABLE) ×2 IMPLANT
TRAY FOLEY MTR SLVR 16FR STAT (SET/KITS/TRAYS/PACK) ×2 IMPLANT
WATER STERILE IRR 500ML POUR (IV SOLUTION) ×2 IMPLANT
WRAPON POLAR PAD KNEE (MISCELLANEOUS) ×2

## 2021-12-25 NOTE — Transfer of Care (Signed)
Immediate Anesthesia Transfer of Care Note  Patient: Charlotte Glover  Procedure(s) Performed: COMPUTER ASSISTED TOTAL KNEE ARTHROPLASTY (Right: Knee)  Patient Location: PACU  Anesthesia Type:Spinal  Level of Consciousness: awake, alert  and oriented  Airway & Oxygen Therapy: Patient Spontanous Breathing and Patient connected to face mask oxygen  Post-op Assessment: Report given to RN and Post -op Vital signs reviewed and stable  Post vital signs: Reviewed and stable  Last Vitals:  Vitals Value Taken Time  BP 122/61 12/25/21 1536  Temp    Pulse 73 12/25/21 1537  Resp 18 12/25/21 1537  SpO2 100 % 12/25/21 1537  Vitals shown include unvalidated device data.  Last Pain:  Vitals:   12/25/21 0934  TempSrc: Oral         Complications: No notable events documented.

## 2021-12-25 NOTE — Plan of Care (Signed)
  Problem: Education: Goal: Knowledge of General Education information will improve Description: Including pain rating scale, medication(s)/side effects and non-pharmacologic comfort measures Outcome: Progressing   Problem: Clinical Measurements: Goal: Will remain free from infection Outcome: Progressing   

## 2021-12-25 NOTE — Anesthesia Procedure Notes (Signed)
Spinal  Patient location during procedure: OR Start time: 12/25/2021 12:30 PM End time: 12/25/2021 12:34 PM Reason for block: surgical anesthesia Staffing Performed: anesthesiologist  Anesthesiologist: Darrin Nipper, MD Performed by: Darrin Nipper, MD Authorized by: Darrin Nipper, MD   Preanesthetic Checklist Completed: patient identified, IV checked, site marked, risks and benefits discussed, surgical consent, monitors and equipment checked, pre-op evaluation and timeout performed Spinal Block Patient position: sitting Prep: ChloraPrep Patient monitoring: heart rate, cardiac monitor, continuous pulse ox and blood pressure Approach: right paramedian Location: L2-3 Injection technique: single-shot Needle Needle type: Whitacre  Needle gauge: 22 G Needle length: 9 cm Assessment Sensory level: T4 Events: CSF return Additional Notes Attempts by two providers; difficult placement 2/2 positioning and os.  Ultimately placed right paramedian at L2-3 by [facility].

## 2021-12-25 NOTE — Anesthesia Preprocedure Evaluation (Addendum)
Anesthesia Evaluation  Patient identified by MRN, date of birth, ID band Patient awake    Reviewed: Allergy & Precautions, NPO status , Patient's Chart, lab work & pertinent test results  History of Anesthesia Complications Negative for: history of anesthetic complications  Airway Mallampati: IV   Neck ROM: Full    Dental  (+)    Pulmonary neg pulmonary ROS,    Pulmonary exam normal breath sounds clear to auscultation       Cardiovascular Exercise Tolerance: Good hypertension, + CAD (s/p MI) and +CHF (with preserved EF)  Normal cardiovascular exam+ dysrhythmias (a fib on Xarelto, last dose 9am on 12/21/21) + pacemaker (SSS, complete heart block') + Valvular Problems/Murmurs (MR, TR, AR)  Rhythm:Regular Rate:Normal  ECG 10/11/21: Ventricular-paced rhythm  Echo 06/21/20:  NORMAL LEFT VENTRICULAR SYSTOLIC FUNCTION  NORMAL RIGHT VENTRICULAR SYSTOLIC FUNCTION  MODERATE VALVULAR REGURGITATION  NO VALVULAR STENOSIS  No change in valvular disease from 6/21 study   Neuro/Psych  Headaches, HOH; posterior fossa meningioma    GI/Hepatic hiatal hernia, GERD  ,  Endo/Other  negative endocrine ROS  Renal/GU Renal disease (stage III CKD)     Musculoskeletal  (+) Arthritis ,   Abdominal   Peds  Hematology Breast CA   Anesthesia Other Findings Cardiology note 10/11/21:  87 year old female with paroxysmal atrial fibrillation, status post catheter ablation, redo catheter ablation with eventual AV nodal ablation with dual-chamber pacemaker. The patient has paroxysmal atrial fibrillation, on Xarelto for stroke prevention. Patient has essential hypertension, blood pressure well controlled on current BP medications. 2D echocardiogram revealed preserved left ventricular function, with severe mitral and tricuspid regurgitation. The patient underwent cardiac catheterization which revealed normal coronary anatomy with with severe mitral  regurgitation. The patient was referred to [facility] where she underwent upgrade of dual-chamber pacemaker to CRT. The patient underwent CRT-P change out on 02/02/2020. Patient experienced recent vision disturbance, possible TIA, repeat 2D echocardiogram 06/21/2020 revealed normal left ventricular function, with LVEF greater than 55%, with moderate mitral, tricuspid and aortic insufficiency. The patient should be at acceptable low to moderate risk for serious cardiovascular complication for right knee arthroplasty.  Plan   1. Continue current medications 2. Continue Xarelto for stroke prevention 3. Counseled patient about low-sodium diet 4. DASH diet printed instructions given to the patient 5. Counseled patient about low-cholesterol diet 6. Low-fat and cholesterol diet printed instructions given to the patient 7. ICD clinic as scheduled 8. Proceed with right knee arthroplasty as scheduled 9. Defer further cardiac diagnostics at this time 10. Hold Xarelto 3 days prior and resume 1 day after right knee arthroplasty 11. Return to clinic for follow-up in 3 months   Reproductive/Obstetrics                        Anesthesia Physical Anesthesia Plan  ASA: 4  Anesthesia Plan: General and Spinal   Post-op Pain Management:    Induction: Intravenous  PONV Risk Score and Plan: 3 and Propofol infusion, TIVA, Treatment may vary due to age or medical condition and Ondansetron  Airway Management Planned: Natural Airway and Nasal Cannula  Additional Equipment:   Intra-op Plan:   Post-operative Plan:   Informed Consent: I have reviewed the patients History and Physical, chart, labs and discussed the procedure including the risks, benefits and alternatives for the proposed anesthesia with the patient or authorized representative who has indicated his/her understanding and acceptance.       Plan Discussed with: CRNA  Anesthesia Plan Comments: (Plan  for spinal and GA with  natural airway, LMA/GETA backup.  Patient consented for risks of anesthesia including but not limited to:  - adverse reactions to medications - damage to eyes, teeth, lips or other oral mucosa - nerve damage due to positioning  - sore throat or hoarseness - headache, bleeding, infection, nerve damage 2/2 spinal - damage to heart, brain, nerves, lungs, other parts of body or loss of life  Informed patient about role of CRNA in peri- and intra-operative care.  Patient voiced understanding.)       Anesthesia Quick Evaluation

## 2021-12-25 NOTE — H&P (Signed)
The patient has been re-examined, and the chart reviewed, and there have been no interval changes to the documented history and physical.    The risks, benefits, and alternatives have been discussed at length. The patient expressed understanding of the risks benefits and agreed with plans for surgical intervention.  Dorcas Melito P. Angele Wiemann, Jr. M.D.    

## 2021-12-25 NOTE — Op Note (Signed)
OPERATIVE NOTE  DATE OF SURGERY:  12/25/2021  PATIENT NAME:  Tenya Araque   DOB: 11-20-1927  MRN: [id number]  PRE-OPERATIVE DIAGNOSIS: Degenerative arthrosis of the right knee, primary  POST-OPERATIVE DIAGNOSIS:  Same  PROCEDURE:  Right total knee arthroplasty using computer-assisted navigation  SURGEON:  Marciano Sequin. M.D.  ASSISTANT: Cassell Smiles, PA-C (present and scrubbed throughout the case, critical for assistance with exposure, retraction, instrumentation, and closure)  ANESTHESIA: spinal  ESTIMATED BLOOD LOSS: 50 mL  FLUIDS REPLACED: 800 mL of crystalloid  TOURNIQUET TIME: 75 minutes  DRAINS: 2 medium Hemovac drains  SOFT TISSUE RELEASES: Anterior cruciate ligament, posterior cruciate ligament, deep medial collateral ligament, patellofemoral ligament  IMPLANTS UTILIZED: DePuy Attune size 6N posterior stabilized femoral component (cemented), size 6 rotating platform tibial component (cemented), 38 mm medialized dome patella (cemented), and a 5 mm stabilized rotating platform polyethylene insert.  INDICATIONS FOR SURGERY: Dyana Magner is a 88 y.o. year old female with a long history of progressive knee pain. X-rays demonstrated severe degenerative changes in tricompartmental fashion. The patient had not seen any significant improvement despite conservative nonsurgical intervention. After discussion of the risks and benefits of surgical intervention, the patient expressed understanding of the risks benefits and agree with plans for total knee arthroplasty.   The risks, benefits, and alternatives were discussed at length including but not limited to the risks of infection, bleeding, nerve injury, stiffness, blood clots, the need for revision surgery, cardiopulmonary complications, among others, and they were willing to proceed.  PROCEDURE IN DETAIL: The patient was brought into the operating room and, after adequate spinal anesthesia was achieved, a  tourniquet was placed on the patient's upper thigh. The patient's knee and leg were cleaned and prepped with alcohol and DuraPrep and draped in the usual sterile fashion. A "timeout" was performed as per usual protocol. The lower extremity was exsanguinated using an Esmarch, and the tourniquet was inflated to 300 mmHg. An anterior longitudinal incision was made followed by a standard mid vastus approach. The deep fibers of the medial collateral ligament were elevated in a subperiosteal fashion off of the medial flare of the tibia so as to maintain a continuous soft tissue sleeve. The patella was subluxed laterally and the patellofemoral ligament was incised. Inspection of the knee demonstrated severe degenerative changes with full-thickness loss of articular cartilage. Osteophytes were debrided using a rongeur. Anterior and posterior cruciate ligaments were excised. Two 4.0 mm Schanz pins were inserted in the femur and into the tibia for attachment of the array of trackers used for computer-assisted navigation. Hip center was identified using a circumduction technique. Distal landmarks were mapped using the computer. The distal femur and proximal tibia were mapped using the computer. The distal femoral cutting guide was positioned using computer-assisted navigation so as to achieve a 5 distal valgus cut. The femur was sized and it was felt that a size 6N femoral component was appropriate. A size 6 femoral cutting guide was positioned and the anterior cut was performed and verified using the computer. This was followed by completion of the posterior and chamfer cuts. Femoral cutting guide for the central box was then positioned in the center box cut was performed.  Attention was then directed to the proximal tibia. Medial and lateral menisci were excised. The extramedullary tibial cutting guide was positioned using computer-assisted navigation so as to achieve a 0 varus-valgus alignment and 3 posterior slope.  The cut was performed and verified using the computer. The proximal tibia was  sized and it was felt that a size 6 tibial tray was appropriate. Tibial and femoral trials were inserted followed by insertion of a 5 mm polyethylene insert. This allowed for excellent mediolateral soft tissue balancing both in flexion and in full extension. Finally, the patella was cut and prepared so as to accommodate a 38 mm medialized dome patella. A patella trial was placed and the knee was placed through a range of motion with excellent patellar tracking appreciated. The femoral trial was removed after debridement of posterior osteophytes. The central post-hole for the tibial component was reamed followed by insertion of a keel punch. Tibial trials were then removed. Cut surfaces of bone were irrigated with copious amounts of normal saline using pulsatile lavage and then suctioned dry. Polymethylmethacrylate cement with gentamicin was prepared in the usual fashion using a vacuum mixer. Cement was applied to the cut surface of the proximal tibia as well as along the undersurface of a size 6 rotating platform tibial component. Tibial component was positioned and impacted into place. Excess cement was removed using [employer] fast streamer. Cement was then applied to the cut surfaces of the femur as well as along the posterior flanges of the size 6N femoral component. The femoral component was positioned and impacted into place. Excess cement was removed using [employer] fast streamer. A 5 mm polyethylene trial was inserted and the knee was brought into full extension with steady axial compression applied. Finally, cement was applied to the backside of a 38 mm medialized dome patella and the patellar component was positioned and patellar clamp applied. Excess cement was removed using [employer] fast streamer. After adequate curing of the cement, the tourniquet was deflated after a total tourniquet time of 75 minutes. Hemostasis was achieved using electrocautery.  The knee was irrigated with copious amounts of normal saline using pulsatile lavage followed by 450 ml of Surgiphor and then suctioned dry. 20 mL of 1.3% Exparel and 60 mL of 0.25% Marcaine in 40 mL of normal saline was injected along the posterior capsule, medial and lateral gutters, and along the arthrotomy site. A 5 mm stabilized rotating platform polyethylene insert was inserted and the knee was placed through a range of motion with excellent mediolateral soft tissue balancing appreciated and excellent patellar tracking noted. 2 medium drains were placed in the wound bed and brought out through separate stab incisions. The medial parapatellar portion of the incision was reapproximated using interrupted sutures of #1 Vicryl. Subcutaneous tissue was approximated in layers using first #0 Vicryl followed #2-0 Vicryl. The skin was approximated with skin staples. A sterile dressing was applied.  The patient tolerated the procedure well and was transported to the recovery room in stable condition.    Baylin Gamblin P. Holley Bouche., M.D.

## 2021-12-26 DIAGNOSIS — M1711 Unilateral primary osteoarthritis, right knee: Secondary | ICD-10-CM | POA: Diagnosis not present

## 2021-12-26 MED ORDER — OXYCODONE HCL 5 MG PO TABS: 2.5000 mg | ORAL_TABLET | ORAL | 0 refills | Status: DC | PRN

## 2021-12-26 MED ORDER — TRAMADOL HCL 50 MG PO TABS: 50.0000 mg | ORAL_TABLET | ORAL | 0 refills | Status: DC | PRN

## 2021-12-26 MED ORDER — CELECOXIB 200 MG PO CAPS: 200.0000 mg | ORAL_CAPSULE | Freq: Two times a day (BID) | ORAL | 0 refills | Status: DC

## 2021-12-26 MED ORDER — FUROSEMIDE 40 MG PO TABS
40.0000 mg | ORAL_TABLET | Freq: Once | ORAL | Status: AC
Start: 2021-12-26 — End: 2021-12-26
  Administered 2021-12-26: 40 mg via ORAL

## 2021-12-26 NOTE — Evaluation (Signed)
Physical Therapy Evaluation Patient Details Name: Charlotte Glover MRN: [id number] DOB: [birth year] Today's Date: 12/26/2021  History of Present Illness  S/p R TKA.  Clinical Impression  Pt is awake and alert seated in bedside chair upon arrival of PT. Pt had completed ambulation with OT earlier this AM with good efficacy. Pt was instructed in initial Hep and completed all exercises as prescribed. Pt provided with handout for proper complete of exercises. Pt able to complete ambulation up to 200 feet and stair navigation with 4 steps with good efficacy this session. Pt will continue to benefit from skilled PT services in order to facilitate proper recovery of her PLOF and achieve independence with activities within her home. PT recommending HH PT following discharge to continue to progress toward these goals. Pt left seated in bedside chair with R LE on ice and propped and call bell in reach and all needs met following session.      Recommendations for follow up therapy are one component of a multi-disciplinary discharge planning process, led by the attending physician.  Recommendations may be updated based on patient status, additional functional criteria and insurance authorization.  Follow Up Recommendations Home health PT      Assistance Recommended at Discharge Intermittent Supervision/Assistance  Patient can return home with the following  A little help with walking and/or transfers    Equipment Recommendations Rollator (4 wheels)  Recommendations for Other Services       Functional Status Assessment Patient has had a recent decline in their functional status and demonstrates the ability to make significant improvements in function in a reasonable and predictable amount of time.     Precautions / Restrictions Precautions Precautions: Knee;Fall Precaution Booklet Issued: No Restrictions Weight Bearing Restrictions: Yes RLE Weight Bearing: Weight bearing as tolerated       Mobility  Bed Mobility Overal bed mobility: Modified Independent                  Transfers Overall transfer level: Needs assistance Equipment used: Rolling walker (2 wheels) Transfers: Sit to/from Stand Sit to Stand: Supervision                Ambulation/Gait Ambulation/Gait assistance: Modified independent (Device/Increase time)   Assistive device: Rolling walker (2 wheels) Gait Pattern/deviations: Decreased step length - right, Narrow base of support          Stairs Stairs: Yes Stairs assistance: Supervision Stair Management: Two rails Number of Stairs: 4 General stair comments: Good overall navigation of stairs with min need for cues and no LOB.  Wheelchair Mobility    Modified Rankin (Stroke Patients Only)       Balance Overall balance assessment: Needs assistance Sitting-balance support: No upper extremity supported, Feet supported Sitting balance-Leahy Scale: Normal Sitting balance - Comments: completed UB/LB dressing EOB without UE support   Standing balance support: During functional activity, Reliant on assistive device for balance Standing balance-Leahy Scale: Fair Standing balance comment: Completed standing ADLs at sink with brief periods without UE support, no LOB.  Able to reach slightly outside BOS to reach for papertowel with unilateral support.                             Pertinent Vitals/Pain Pain Assessment Pain Score: 1  Pain Location: R LE Pain Descriptors / Indicators: Aching, Sore Pain Intervention(s): Limited activity within patient's tolerance, Monitored during session, Ice applied    Home Living Family/patient expects to be  discharged to:: Private residence Living Arrangements: Spouse/significant other Available Help at Discharge: Family Type of Home: House Home Access: Stairs to enter Entrance Stairs-Rails: None Entrance Stairs-Number of Steps: 1 small step   Home Layout: One level Home Equipment:  Conservation officer, nature (2 wheels)      Prior Function Prior Level of Function : Independent/Modified Independent             Mobility Comments: indep ADLs Comments: indep     Hand Dominance        Extremity/Trunk Assessment   Upper Extremity Assessment Upper Extremity Assessment: Overall WFL for tasks assessed    Lower Extremity Assessment Lower Extremity Assessment: RLE deficits/detail;Defer to PT evaluation RLE Deficits / Details: s/p R TKA RLE Coordination: WNL    Cervical / Trunk Assessment Cervical / Trunk Assessment: Kyphotic  Communication   Communication: HOH  Cognition Arousal/Alertness: Awake/alert Behavior During Therapy: WFL for tasks assessed/performed Overall Cognitive Status: Within Functional Limits for tasks assessed                                          General Comments      Exercises Total Joint Exercises Ankle Circles/Pumps: AROM, Both, 20 reps Quad Sets: Strengthening, Right, 15 reps Heel Slides: 10 reps, Seated, Right Hip ABduction/ADduction: Right, AROM, 20 reps Straight Leg Raises: Right, 10 reps, AROM (quad lag noted; increased with reps) Long Arc Quad: AROM, Right, 10 reps, Seated Goniometric ROM: 0-80 seated Other Exercises Other Exercises: educated on exercises at home and in keeping up with medications and icing to prevent pain and discomfort Other Exercises: stairs x 4 with B rail assist and Cga from PT for safety Other Exercises: Ambulates 200 feet with SBA and RW. NBOS noted, likely due to new new alignment per pt report.   Assessment/Plan    PT Assessment Patient needs continued PT services  PT Problem List Decreased strength;Decreased mobility;Decreased range of motion;Decreased activity tolerance;Decreased balance       PT Treatment Interventions DME instruction;Gait training;Stair training;Functional mobility training;Therapeutic activities;Therapeutic exercise;Patient/family education    PT Goals  (Current goals can be found in the Care Plan section)       Frequency BID     Co-evaluation               AM-PAC PT "6 Clicks" Mobility  Outcome Measure Help needed turning from your back to your side while in a flat bed without using bedrails?: None Help needed moving from lying on your back to sitting on the side of a flat bed without using bedrails?: None Help needed moving to and from a bed to a chair (including a wheelchair)?: None Help needed standing up from a chair using your arms (e.g., wheelchair or bedside chair)?: None Help needed to walk in hospital room?: A Little Help needed climbing 3-5 steps with a railing? : A Little 6 Click Score: 22    End of Session Equipment Utilized During Treatment: Gait belt Activity Tolerance: Patient tolerated treatment well Patient left: in bed;with call bell/phone within reach Nurse Communication: Mobility status PT Visit Diagnosis: Other abnormalities of gait and mobility (R26.89);Muscle weakness (generalized) (M62.81);Difficulty in walking, not elsewhere classified (R26.2)    Time: 7793-9030 PT Time Calculation (min) (ACUTE ONLY): 34 min   Charges:   PT Evaluation $PT Eval Low Complexity: 1 Low PT Treatments $Therapeutic Exercise: 8-22 mins $Therapeutic Activity: 8-22 mins  Rivka Barbara PT, DPT    Charlotte Glover 12/26/2021, 10:45 AM

## 2021-12-26 NOTE — Progress Notes (Addendum)
1150 Pt has passed PT/OT. Hemovac removed by surgical team. Awaiting for d/c orders to be placed  1245 D/c instructions reviewed with pt and family. All questions and concerns answered. Pt still needs to void prior to being d/c  1400 Pt has not voided yet states she knows her body and she does not have to void at this time and would like to be d/c. Bladder scanned showed 78m. PA benjamin STamala Julianmade aware. Ok to d/c pt. Nurse will inform pt of s/s of retention.

## 2021-12-26 NOTE — Progress Notes (Addendum)
  Subjective: 1 Day Post-Op Procedure(s) (LRB): COMPUTER ASSISTED TOTAL KNEE ARTHROPLASTY (Right) Patient reports pain as well-controlled.  Husband and son at bedside.  Patient is well, and has had no acute complaints or problems Plan is to go Home after hospital stay. Negative for chest pain and shortness of breath Fever: no Gastrointestinal: negative for nausea and vomiting. Patient reports passing gas   Objective: Vital signs in last 24 hours: Temp:  [96.8 F (36 C)-98.6 F (37 C)] 97.1 F (36.2 C) (07/04 0729) Pulse Rate:  [68-73] 68 (07/04 0729) Resp:  [7-21] 18 (07/04 0729) BP: (93-122)/(44-67) 111/47 (07/04 0729) SpO2:  [94 %-100 %] 97 % (07/04 0729)  Intake/Output from previous day:  Intake/Output Summary (Last 24 hours) at 12/26/2021 1206 Last data filed at 12/26/2021 0933 Gross per 24 hour  Intake 1360 ml  Output 525 ml  Net 835 ml    Intake/Output this shift: Total I/O In: 360 [P.O.:360] Out: -   Labs: Recent Labs    12/25/21 0955  HGB 14.6   Recent Labs    12/25/21 0955  HCT 43.0   Recent Labs    12/25/21 0955  NA 136  K 3.5  CL 98  BUN 29*  CREATININE 1.40*  GLUCOSE 74   No results for input(s): "LABPT", "INR" in the last 72 hours.   EXAM General - Patient is Alert, Appropriate, and Oriented Extremity - Neurovascular intact Dorsiflexion/Plantar flexion intact Compartment soft Dressing/Incision -Postoperative dressing remains in place., Following removal of post-op dressing, moderate bloody drainage noted Motor Function - intact, moving foot and toes well on exam.  Cardiovascular- Regular rate and rhythm, no murmurs/rubs/gallops Respiratory- Lungs clear to auscultation bilaterally Gastrointestinal- soft, nontender, and active bowel sounds   Assessment/Plan: 1 Day Post-Op Procedure(s) (LRB): COMPUTER ASSISTED TOTAL KNEE ARTHROPLASTY (Right) Principal Problem:   Total knee replacement status  Estimated body mass index is 25.66 kg/m  as calculated from the following:   Height as of this encounter: '5\' 7"'$  (1.702 m).   Weight as of this encounter: 74.3 kg. Advance diet Up with therapy  Patient has been cleared by PT and is ready for d/c home.   Post-op dressing removed. , Hemovac removed., Mini compression dressing applied. , and Fresh honeycomb dressing applied.   DVT Prophylaxis - Xarelto, Ted hose, and SCDs Weight-Bearing as tolerated to right leg  Cassell Smiles, PA-C [facility] Orthopaedic Surgery 12/26/2021, 12:06 PM

## 2021-12-26 NOTE — TOC Transition Note (Signed)
Transition of Care [facility]) - CM/SW Discharge Note   Patient Details  Name: Charlotte Glover MRN: [id number] Date of Birth: 1928-04-21  [facility]) CM/SW Contact:  Candie Chroman, LCSW Phone Number: 12/26/2021, 12:32 PM   Clinical Narrative:  Patient has orders to discharge home today. She was set up with [facility] prior to admission and she is agreeable to this. She already has a RW and rollator at home. She agreed to 3-in-1. Ordered through [employer] to be delivered before she leaves. Sent secure chat to RN to notify. No further concerns. CSW signing off.   Final next level of care: Home w Home Health Services Barriers to Discharge: Barriers Resolved   Patient Goals and CMS Choice     Choice offered to / list presented to : Patient, Spouse, Adult Children  Discharge Placement                Patient to be transferred to facility by: Family Name of family member notified: Budd and Mikia Delaluz Patient and family notified of of transfer: 12/26/21  Discharge Plan and Services                DME Arranged: 3-N-1 DME Agency: [employer] Date DME Agency Contacted: 12/26/21     HH Arranged: PT [place] Agency: [place] Date [place]: 12/26/21   Representative spoke with at [place]: Left message for [place] [facility]  Social Determinants of Health ([place]) Interventions     Readmission Risk Interventions     No data to display

## 2021-12-26 NOTE — Discharge Summary (Signed)
Physician Discharge Summary  Patient ID: Charlotte Glover MRN: [id number] DOB/AGE: 01/06/1928 88 y.o.  Admit date: 12/25/2021 Discharge date: 12/26/2021  Admission Diagnoses:  Total knee replacement status [Z96.659]  Surgeries:Procedure(s):  Right total knee arthroplasty using computer-assisted navigation   SURGEON:  Marciano Sequin. M.D.   ASSISTANT: Cassell Smiles, PA-C (present and scrubbed throughout the case, critical for assistance with exposure, retraction, instrumentation, and closure)   ANESTHESIA: spinal   ESTIMATED BLOOD LOSS: 50 mL   FLUIDS REPLACED: 800 mL of crystalloid   TOURNIQUET TIME: 75 minutes   DRAINS: 2 medium Hemovac drains   SOFT TISSUE RELEASES: Anterior cruciate ligament, posterior cruciate ligament, deep medial collateral ligament, patellofemoral ligament   IMPLANTS UTILIZED: DePuy Attune size 6N posterior stabilized femoral component (cemented), size 6 rotating platform tibial component (cemented), 38 mm medialized dome patella (cemented), and a 5 mm stabilized rotating platform polyethylene insert.  Discharge Diagnoses: Patient Active Problem List   Diagnosis Date Noted   Total knee replacement status 12/25/2021   Prediabetes 12/14/2021   Acquired thrombophilia ([place]) 08/07/2021   Primary osteoarthritis of right knee 04/09/2021   Swelling of limb 11/01/2020   Enlarged pulmonary artery ([place]) 05/23/2020   Moderate aortic insufficiency 03/07/2020   Intracranial carotid stenosis, right 05/29/2019   Chronic headaches 05/29/2019   Long term current use of systemic steroids 05/04/2019   Screening for osteoporosis 05/04/2019   Temporal arteritis ([place]) 04/07/2019   Meningioma ([place]) 12/29/2018   Chronic daily headache 11/20/2018   Elevated erythrocyte sedimentation rate 11/05/2018   CKD (chronic kidney disease) stage 3, GFR 30-59 ml/min (HCC) 12/27/2017   Shingles 05/06/2017   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 04/12/2017    Primary osteoarthritis of right shoulder 12/07/2016   Rotator cuff tear, right 12/07/2016   Rotator cuff tendinitis, right 12/07/2016   CHB (complete heart block) ([place]) 06/30/2016   Severe mitral regurgitation 05/29/2016   Pedal edema 04/23/2016   Lung nodule, multiple 12/28/2015   Amnesia, global, transient 04/05/2015   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 06/30/2014   Biventricular cardiac pacemaker in situ 05/18/2014   Sleep disorder 05/06/2014   Diffuse pain 04/06/2014   Fatigue 03/16/2014   Headache 03/16/2014   History of melanoma 11/24/2013   Melanoma ([place]) 11/24/2013   DCIS (ductal carcinoma in situ) 08/11/2013   History of ductal carcinoma in situ (DCIS) of breast 08/11/2013   H/O non-ST elevation myocardial infarction (NSTEMI) 01/01/2013   Hearing impairment 01/01/2013   S/P ablation of atrial fibrillation 07/11/2012   Edema 01/23/2012   Hypertension 01/23/2012   Atrial fibrillation ([place]) 11/27/2011   Coronary artery disease 11/27/2011    Past Medical History:  Diagnosis Date   (HFpEF) heart failure with preserved ejection fraction ([place]) 06/21/2020   a.) TTE 06/21/2020: EF >55%, RAE, G1DD   Amaurosis fugax of left eye    Arthritis    Atrial fibrillation ([place])    a.) Dx'd in 2009; b.) CHA2DS2-VASc = 6 (age x 2, sex, CHF, HTN, prior MI); c.) s/p PVI 09/31/2013; DCCV (200 J x 1) 04/02/2012, repeat PVI with roof line, coronary sinus isolation, and CFAE ablation 01/02/2013; DCCV 01/18/2013 (200 J x 1); AV nodal ablation 05/28/2014;  d.) rate/rhythm maintained without pharmacological intervention; chronically anticoagulated using rivaroxaban   Cardiac arrest ([place]) 10/2009   a.) witnessed arrest at home; husband performed CPR; ROSC achieved in the field. Admitted for workup --> etiology of CV event undetermined; felt to be secondary to "electrolyte  abnormalities".   Cardiac murmur    CHB (complete heart block) ([place])    a.) s/p PPM  05/28/2014; b.) CRT-P upgrade 07/13/2016.   Chickenpox    CKD (chronic kidney disease), stage III (HCC)    Coronary artery disease    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/13/2013   a.) grade II; ER/PR (+)   Dyspnea    Edema    GERD (gastroesophageal reflux disease)    Granulomatous disease ([place])    a.) spleen   Hiatal hernia    HLD (hyperlipidemia)    HOH (hard of hearing)    Hypertension 01/23/2012   [place] esophagus    Long term current use of antithrombotics/antiplatelets    a.) rivaroxaban   Melanoma of lower leg, left (HCC)    Meningioma, multiple ([place]) 07/14/2019   a.) MRI brain 07/14/2019: 1.2 x 1.3 x 1.3 cm midline posterior fossa meningioma inferior to the cerebellar vertex; 7 x 3 mm dural based meningioma along frontal convexity   NSTEMI (non-ST elevated myocardial infarction) ([place]) 08/2009   Presence of permanent cardiac pacemaker    Pulmonary HTN ([place]) 05/08/2016   a.) R/LHC 05/08/2016: EF >55, LVEDP 15, mean PA 38, AO sat 97, RVSP 50, CO 3 L/min   S/P ablation of atrial fibrillation    Shingles    SSS (sick sinus syndrome) ([place])    a.) CRT-P in place   Valvular heart disease 02/08/2012   a.) cMRI 02/08/2012: EF 50-55, mild-mod AR, mod MR; b.) TEE 02/26/2012: EF >55, mild MR/AR/TR; c.) R/LHC 05/08/2016: sev MR; d.) TEE 06/14/2016: EF >55, sev MR, mod AR/TR; e.) TTE 10/05/2016: EF >55, triv PR, mod AR/MR/TR; f.) TTE 04/22/2018: EF >55, mild MR/TR/PR, mod AR; g.) TTE 11/27/2019: EF 50, mild PR, mod AR/MR/TR; h.) TTE 06/21/2020: EF >55, triv PR, mod AR/MR/TR   Wears hearing aid in both ears      Transfusion:    Consultants (if any):   Discharged Condition: Improved  Hospital Course: Charlotte Glover is an 88 y.o. female who was admitted 12/25/2021 with a diagnosis of right knee osteoarthritis and went to the operating room on 12/25/2021 and underwent right total knee arthroplasty. The patient received perioperative antibiotics for prophylaxis (see  below). The patient tolerated the procedure well and was transported to PACU in stable condition. After meeting PACU criteria, the patient was subsequently transferred to the Orthopaedics/Rehabilitation unit.   The patient received DVT prophylaxis in the form of early mobilization, Lovenox, TED hose, and SCDs . A sacral pad had been placed and heels were elevated off of the bed with rolled towels in order to protect skin integrity. Foley catheter was discontinued on postoperative day #0. Wound drains were discontinued on postoperative day #1. The surgical incision was healing well without signs of infection.  Physical therapy was initiated postoperatively for transfers, gait training, and strengthening. Occupational therapy was initiated for activities of daily living and evaluation for assisted devices. Rehabilitation goals were reviewed in detail with the patient. The patient made steady progress with physical therapy and physical therapy recommended discharge to Home.   The patient achieved the preliminary goals of this hospitalization and was felt to be medically and orthopaedically appropriate for discharge.  She was given perioperative antibiotics:  Anti-infectives (From admission, onward)    Start     Dose/Rate Route Frequency Ordered Stop   12/25/21 1830  ceFAZolin (ANCEF) IVPB 2g/100 mL premix        2 g 200 mL/hr  over 30 Minutes Intravenous Every 6 hours 12/25/21 1740 12/26/21 0454   12/25/21 0938  ceFAZolin (ANCEF) 2-4 GM/100ML-% IVPB       Note to Pharmacy: Olena Mater F: cabinet override      12/25/21 0938 12/25/21 1257   12/25/21 0600  ceFAZolin (ANCEF) IVPB 2g/100 mL premix        2 g 200 mL/hr over 30 Minutes Intravenous On call to O.R. 12/25/21 0020 12/25/21 1237     .  Recent vital signs:  Vitals:   12/26/21 0359 12/26/21 0729  BP: (!) 104/55 (!) 111/47  Pulse: 72 68  Resp: 18 18  Temp: 97.7 F (36.5 C) (!) 97.1 F (36.2 C)  SpO2: 97% 97%    Recent  laboratory studies:  Recent Labs    12/25/21 0955  HGB 14.6  HCT 43.0  K 3.5  CL 98  BUN 29*  CREATININE 1.40*  GLUCOSE 74    Diagnostic Studies: DG Knee Right Port  Result Date: 12/25/2021 CLINICAL DATA:  Right knee replacement. EXAM: PORTABLE RIGHT KNEE - 1-2 VIEW COMPARISON:  None Available. FINDINGS: The right knee demonstrates a total knee arthroplasty without evidence of hardware failure or complication. There is no fracture or dislocation. The alignment is anatomic. Surgical drains are present. Post-surgical changes noted in the surrounding soft tissues. IMPRESSION: 1. Right total knee arthroplasty without evidence of acute postoperative complication. Electronically Signed   By: Titus Dubin M.D.   On: 12/25/2021 15:53    Discharge Medications:   Allergies as of 12/26/2021       Reactions   Amiodarone Other (See Comments)   Bradycardia and sinus pauses   Baclofen Other (See Comments)   Double vision   Metoprolol Rash   Pantoprazole Rash   Tape Rash        Medication List     TAKE these medications    Acetaminophen 500 MG capsule Take by mouth.   azelastine 0.1 % nasal spray Commonly known as: ASTELIN Place into both nostrils as needed. Use in each nostril as directed   celecoxib 200 MG capsule Commonly known as: CELEBREX Take 1 capsule (200 mg total) by mouth 2 (two) times daily.   furosemide 40 MG tablet Commonly known as: LASIX Take 40 mg by mouth 2 (two) times daily.   gabapentin 300 MG capsule Commonly known as: NEURONTIN Take 300 mg by mouth 3 (three) times daily.   metolazone 5 MG tablet Commonly known as: ZAROXOLYN Take 5 mg by mouth once a week. Takes on Friday   nitroGLYCERIN 0.4 MG SL tablet Commonly known as: NITROSTAT Place 0.4 mg under the tongue every 5 (five) minutes x 2 doses as needed for chest pain.   omeprazole 20 MG capsule Commonly known as: PRILOSEC Take 40 mg by mouth 2 (two) times daily before a meal.   oxyCODONE 5 MG  immediate release tablet Commonly known as: Oxy IR/ROXICODONE Take 0.5 tablets (2.5 mg total) by mouth every 4 (four) hours as needed for severe pain.   potassium chloride 10 MEQ tablet Commonly known as: KLOR-CON Take 10 mEq by mouth 3 (three) times daily.   PreserVision AREDS 2 Caps Take 1 capsule by mouth 2 (two) times daily.   REFRESH OP Apply 1 drop to eye daily as needed (dry eyes).   rivaroxaban 20 MG Tabs tablet Commonly known as: XARELTO Take 20 mg by mouth daily after breakfast.   rosuvastatin 10 MG tablet Commonly known as: CRESTOR Take 10 mg by  mouth daily.   senna 8.6 MG Tabs tablet Commonly known as: SENOKOT Take 1 tablet by mouth daily as needed for mild constipation.   spironolactone 25 MG tablet Commonly known as: ALDACTONE Take 25 mg by mouth daily.   traMADol 50 MG tablet Commonly known as: ULTRAM Take by mouth every 6 (six) hours as needed. What changed: Another medication with the same name was added. Make sure you understand how and when to take each.   traMADol 50 MG tablet Commonly known as: ULTRAM Take 1 tablet (50 mg total) by mouth every 4 (four) hours as needed for moderate pain. What changed: You were already taking a medication with the same name, and this prescription was added. Make sure you understand how and when to take each.   triamcinolone ointment 0.1 % Commonly known as: KENALOG Apply 1 application  topically as needed.               Durable Medical Equipment  (From admission, onward)           Start     Ordered   12/25/21 1741  DME Walker rolling  Once       Question:  Patient needs a walker to treat with the following condition  Answer:  Total knee replacement status   12/25/21 1740   12/25/21 1741  DME Bedside commode  Once       Question:  Patient needs a bedside commode to treat with the following condition  Answer:  Total knee replacement status   12/25/21 1740            Disposition: Home with home  health PT     Follow-up Information     Reche Dixon, PA-C Follow up on 01/09/2022.   Specialty: Orthopedic Surgery Why: at 10:15am Contact information: [place] [address] [phone]         Dereck Leep, MD Follow up on 02/06/2022.   Specialty: Orthopedic Surgery Why: at 3:00pm Contact information: [place] [address] [place], PA-C 12/26/2021, 12:12 PM

## 2021-12-26 NOTE — Plan of Care (Signed)

## 2021-12-26 NOTE — TOC CM/SW Note (Signed)
Patient is not able to walk the distance required to go the bathroom, or he/she is unable to safely negotiate stairs required to access the bathroom.  A 3in1 BSC will alleviate this problem  

## 2021-12-26 NOTE — Evaluation (Signed)
Occupational Therapy Evaluation Patient Details Name: Charlotte Glover MRN: [id number] DOB: [birth year] Today's Date: 12/26/2021   History of Present Illness S/p R TKA.   Clinical Impression   Pt seen for OT evaluation.  S/p R TKA, WBAT to RLE.  Pain well managed.  Pt performing UB/LB ADLs during OT session with set up/supv at beside and sit to stand without concern.  Pt tolerated functional mobility within bathroom for toilet transfer with RW and supv.  Pt reports a friend is letting her borrow a 3in1 which she will plan to place over her toilet at home, but has concerns with transfer up/down from a shower chair that does not have arm rests.  OT recommended 3in1 for in shower for easier transfer to utilize arm rests.  MSW notified of request for this DME at d/c.  Pt in agreement.  Pt will d/c home with spouse who is able to provide set up/supv for ADLs as needed.  No additional OT needs at this time.       Recommendations for follow up therapy are one component of a multi-disciplinary discharge planning process, led by the attending physician.  Recommendations may be updated based on patient status, additional functional criteria and insurance authorization.   Follow Up Recommendations  No OT follow up    Assistance Recommended at Discharge Intermittent Supervision/Assistance  Patient can return home with the following A little help with walking and/or transfers;A little help with bathing/dressing/bathroom;Assistance with cooking/housework;Help with stairs or ramp for entrance    Functional Status Assessment  Patient has had a recent decline in their functional status and demonstrates the ability to make significant improvements in function in a reasonable and predictable amount of time.  Equipment Recommendations  BSC/3in1    Recommendations for Other Services       Precautions / Restrictions Precautions Precautions: Knee;Fall Precaution Booklet Issued: No Restrictions Weight  Bearing Restrictions: Yes RLE Weight Bearing: Weight bearing as tolerated      Mobility Bed Mobility Overal bed mobility: Modified Independent               Patient Response: Cooperative  Transfers Overall transfer level: Needs assistance Equipment used: Rolling walker (2 wheels) Transfers: Sit to/from Stand Sit to Stand: Supervision                  Balance Overall balance assessment: Needs assistance Sitting-balance support: No upper extremity supported, Feet supported Sitting balance-Leahy Scale: Normal Sitting balance - Comments: completed UB/LB dressing EOB without UE support   Standing balance support: During functional activity, Reliant on assistive device for balance Standing balance-Leahy Scale: Fair Standing balance comment: Completed standing ADLs at sink with brief periods without UE support, no LOB.  Able to reach slightly outside BOS to reach for papertowel with unilateral support.                           ADL either performed or assessed with clinical judgement   ADL Overall ADL's : Needs assistance/impaired Eating/Feeding: Independent   Grooming: Wash/dry face;Oral care;Supervision/safety;Standing           Upper Body Dressing : Set up;Sitting   Lower Body Dressing: Supervision/safety;Sit to/from stand Lower Body Dressing Details (indicate cue type and reason): sat EOB to don panties and shorts.  Hiked both with RW for support.  Able to reach feet to don/doff socks. Toilet Transfer: Company secretary Details (indicate cue type and reason): 3in1 over toilet Toileting- Clothing  Manipulation and Hygiene: Supervision/safety;Sit to/from stand       Functional mobility during ADLs: Supervision/safety;Rolling walker (2 wheels) General ADL Comments: Supv with RW to ambulate in room to complete ADLs, including grooming, UB/LB dressing, and toilet transfer.     Vision Baseline Vision/History: 1 Wears  glasses Patient Visual Report: No change from baseline                  Pertinent Vitals/Pain Pain Assessment Pain Assessment: 0-10 Pain Score: 1  Pain Location: R LE Pain Descriptors / Indicators: Aching, Sore Pain Intervention(s): Limited activity within patient's tolerance, Monitored during session, Ice applied, RN gave pain meds during session     Hand Dominance     Extremity/Trunk Assessment Upper Extremity Assessment Upper Extremity Assessment: Overall WFL for tasks assessed   Lower Extremity Assessment Lower Extremity Assessment: RLE deficits/detail;Defer to PT evaluation RLE Deficits / Details: s/p R TKA   Cervical / Trunk Assessment Cervical / Trunk Assessment: Kyphotic   Communication Communication Communication: HOH   Cognition Arousal/Alertness: Awake/alert Behavior During Therapy: WFL for tasks assessed/performed Overall Cognitive Status: Within Functional Limits for tasks assessed                                             Exercises Other Exercises Other Exercises: Educated on OT role, fall prevention, DME   Shoulder Instructions      Home Living Family/patient expects to be discharged to:: Private residence Living Arrangements: Spouse/significant other Available Help at Discharge: Family Type of Home: House Home Access: Stairs to enter Technical brewer of Steps: 1 small step Entrance Stairs-Rails: None Home Layout: One level     Bathroom Shower/Tub: Occupational psychologist: Standard     Home Equipment: Conservation officer, nature (2 wheels)          Prior Functioning/Environment Prior Level of Function : Independent/Modified Independent             Mobility Comments: indep ADLs Comments: indep        OT Problem List: Decreased strength;Decreased knowledge of use of DME or AE;Decreased range of motion;Impaired balance (sitting and/or standing);Pain      OT Treatment/Interventions:      OT  Goals(Current goals can be found in the care plan section) Acute Rehab OT Goals Patient Stated Goal: To go home today OT Goal Formulation: With patient Time For Goal Achievement: 01/09/22 Potential to Achieve Goals: Good  OT Frequency:                    AM-PAC OT "6 Clicks" Daily Activity     Outcome Measure Help from another person eating meals?: None Help from another person taking care of personal grooming?: None Help from another person toileting, which includes using toliet, bedpan, or urinal?: A Little Help from another person bathing (including washing, rinsing, drying)?: A Little Help from another person to put on and taking off regular upper body clothing?: None Help from another person to put on and taking off regular lower body clothing?: A Little 6 Click Score: 21   End of Session Equipment Utilized During Treatment: Gait belt;Rolling walker (2 wheels) Nurse Communication: Mobility status  Activity Tolerance: Patient tolerated treatment well;No increased pain Patient left: in chair;with call bell/phone within reach  OT Visit Diagnosis: Other abnormalities of gait and mobility (R26.89);Muscle weakness (generalized) (M62.81);Pain Pain - Right/Left:  Right Pain - part of body: Leg                Time: 2811-8867 OT Time Calculation (min): 32 min Charges:  OT General Charges $OT Visit: 1 Visit OT Evaluation $OT Eval Low Complexity: 1 Low OT Treatments $Self Care/Home Management : 8-22 mins  Leta Speller, MS, OTR/L   Darleene Cleaver 12/26/2021, 10:00 AM

## 2021-12-27 ENCOUNTER — Encounter: Payer: Self-pay | Admitting: Orthopedic Surgery

## 2021-12-28 ENCOUNTER — Ambulatory Visit: Payer: Medicare Other

## 2021-12-31 NOTE — Anesthesia Postprocedure Evaluation (Signed)
Anesthesia Post Note  Patient: Charlotte Glover  Procedure(s) Performed: COMPUTER ASSISTED TOTAL KNEE ARTHROPLASTY (Right: Knee)  Anesthesia Type: Spinal Pain management: pain level controlled Vital Signs Assessment: post-procedure vital signs reviewed and stable Cardiovascular status: blood pressure returned to baseline and stable Anesthetic complications: no Comments: Patient was not seen by post-op anesthesia team prior to discharge, but in the patient was doing well without complaints prior to discharge per the nursing notes.   No notable events documented.   Last Vitals:  Vitals:   12/26/21 0729 12/26/21 1325  BP: (!) 111/47 138/60  Pulse: 68 70  Resp: 18 18  Temp: (!) 36.2 C   SpO2: 97% 100%    Last Pain:  Vitals:   12/26/21 0743  TempSrc:   PainSc: 2                  Martha Clan

## 2022-01-09 ENCOUNTER — Ambulatory Visit: Payer: Medicare Other

## 2022-01-09 DIAGNOSIS — M6281 Muscle weakness (generalized): Secondary | ICD-10-CM

## 2022-01-09 DIAGNOSIS — R262 Difficulty in walking, not elsewhere classified: Secondary | ICD-10-CM

## 2022-01-12 ENCOUNTER — Ambulatory Visit: Payer: Medicare Other | Attending: Orthopedic Surgery

## 2022-01-12 DIAGNOSIS — M6281 Muscle weakness (generalized): Secondary | ICD-10-CM | POA: Insufficient documentation

## 2022-01-12 DIAGNOSIS — M25561 Pain in right knee: Secondary | ICD-10-CM | POA: Insufficient documentation

## 2022-01-12 DIAGNOSIS — G8929 Other chronic pain: Secondary | ICD-10-CM | POA: Diagnosis present

## 2022-01-12 DIAGNOSIS — R262 Difficulty in walking, not elsewhere classified: Secondary | ICD-10-CM | POA: Insufficient documentation

## 2022-01-12 NOTE — Therapy (Signed)
OUTPATIENT PHYSICAL THERAPY KNEE EVALUATION   Patient Name: Charlotte Glover MRN: [id number] DOB:[birth year], 88 y.o., female Today's Date: 01/14/2022   PT End of Session - 01/14/22 1429     Visit Number 1    Number of Visits 25    Date for PT Re-Evaluation 04/06/22    Authorization Type eval: 01/12/22    PT Start Time 0845    PT Stop Time 0930    PT Time Calculation (min) 45 min    Activity Tolerance Patient tolerated treatment well    Behavior During Therapy [facility] for tasks assessed/performed             Past Medical History:  Diagnosis Date   (HFpEF) heart failure with preserved ejection fraction ([place]) 06/21/2020   a.) TTE 06/21/2020: EF >55%, RAE, G1DD   Amaurosis fugax of left eye    Arthritis    Atrial fibrillation ([place])    a.) Dx'd in 2009; b.) CHA2DS2-VASc = 6 (age x 2, sex, CHF, HTN, prior MI); c.) s/p PVI 09/31/2013; DCCV (200 J x 1) 04/02/2012, repeat PVI with roof line, coronary sinus isolation, and CFAE ablation 01/02/2013; DCCV 01/18/2013 (200 J x 1); AV nodal ablation 05/28/2014;  d.) rate/rhythm maintained without pharmacological intervention; chronically anticoagulated using rivaroxaban   Cardiac arrest ([place]) 10/2009   a.) witnessed arrest at home; husband performed CPR; ROSC achieved in the field. Admitted for workup --> etiology of CV event undetermined; felt to be secondary to "electrolyte abnormalities".   Cardiac murmur    CHB (complete heart block) ([place])    a.) s/p PPM 05/28/2014; b.) CRT-P upgrade 07/13/2016.   Chickenpox    CKD (chronic kidney disease), stage III (HCC)    Coronary artery disease    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/13/2013   a.) grade II; ER/PR (+)   Dyspnea    Edema    GERD (gastroesophageal reflux disease)    Granulomatous disease ([place])    a.) spleen   Hiatal hernia    HLD (hyperlipidemia)    HOH (hard of hearing)    Hypertension 01/23/2012   [place] esophagus    Long term current use of  antithrombotics/antiplatelets    a.) rivaroxaban   Melanoma of lower leg, left (HCC)    Meningioma, multiple ([place]) 07/14/2019   a.) MRI brain 07/14/2019: 1.2 x 1.3 x 1.3 cm midline posterior fossa meningioma inferior to the cerebellar vertex; 7 x 3 mm dural based meningioma along frontal convexity   NSTEMI (non-ST elevated myocardial infarction) ([place]) 08/2009   Presence of permanent cardiac pacemaker    Pulmonary HTN ([place]) 05/08/2016   a.) R/LHC 05/08/2016: EF >55, LVEDP 15, mean PA 38, AO sat 97, RVSP 50, CO 3 L/min   S/P ablation of atrial fibrillation    Shingles    SSS (sick sinus syndrome) ([place])    a.) CRT-P in place   Valvular heart disease 02/08/2012   a.) cMRI 02/08/2012: EF 50-55, mild-mod AR, mod MR; b.) TEE 02/26/2012: EF >55, mild MR/AR/TR; c.) R/LHC 05/08/2016: sev MR; d.) TEE 06/14/2016: EF >55, sev MR, mod AR/TR; e.) TTE 10/05/2016: EF >55, triv PR, mod AR/MR/TR; f.) TTE 04/22/2018: EF >55, mild MR/TR/PR, mod AR; g.) TTE 11/27/2019: EF 50, mild PR, mod AR/MR/TR; h.) TTE 06/21/2020: EF >55, triv PR, mod AR/MR/TR   Wears hearing aid in both ears    Past Surgical History:  Procedure Laterality Date   ANTERIOR VITRECTOMY Left 03/20/2017   Procedure: ANTERIOR VITRECTOMY;  Surgeon: Leandrew Koyanagi, MD;  Location: [facility];  Service: Ophthalmology;  Laterality: Left;  IVA TOPICAL LEFT   ARTERY BIOPSY Right 04/08/2019   Procedure: BIOPSY TEMPORAL ARTERY;  Surgeon: Algernon Huxley, MD;  Location: [facility];  Service: Vascular;  Laterality: Right;   ATRIAL FIBRILLATION ABLATION N/A 03/24/2012   Procedure: ATRIAL FIBRILLATION ABLATION (PVI); Location: [facility]   ATRIAL FIBRILLATION ABLATION N/A 01/02/2013   Procedure: ATRIAL FIBRILLATION ABLATION (PVI, roof line, coronary sinus siolation, CFAE ablation); Location: [facility]   AV NODE ABLATION N/A 05/26/2014   Procedure: AV NODE ABLATION; Location: [facility]   BIV PACEMAKER INSERTION CRT-P N/A 07/13/2016   Procedure: CRT-P BiV  PACEMAKER UPGRADE; Location: [facility]   BREAST BIOPSY Right 2015   + for DCIS    BREAST SURGERY     CARDIAC CATHETERIZATION Bilateral 05/08/2016   Procedure: Right/Left Heart Cath and Coronary Angiography;  Surgeon: Isaias Cowman, MD;  Location: [facility];  Service: Cardiovascular;  Laterality: Bilateral;   CARDIOVERSION N/A 04/02/2012   Procedure: CARDIOVERSION; Location: [facility]   CARDIOVERSION N/A 01/15/2013   Procedure: CARDIOVERSION; Location: [facility]   CATARACT EXTRACTION W/PHACO Left 03/20/2017   Procedure: IOL EXCHANGE;  Surgeon: Leandrew Koyanagi, MD;  Location: [facility];  Service: Ophthalmology;  Laterality: Left;   CHOLECYSTECTOMY     COLONOSCOPY     patient reports several   DILATION AND CURETTAGE OF UTERUS     ESOPHAGOGASTRODUODENOSCOPY     ESOPHAGOGASTRODUODENOSCOPY (EGD) WITH PROPOFOL N/A 12/13/2020   Procedure: ESOPHAGOGASTRODUODENOSCOPY (EGD) WITH PROPOFOL;  Surgeon: Lesly Rubenstein, MD;  Location: [facility];  Service: Endoscopy;  Laterality: N/A;   KNEE ARTHROPLASTY Right 12/25/2021   Procedure: COMPUTER ASSISTED TOTAL KNEE ARTHROPLASTY;  Surgeon: Dereck Leep, MD;  Location: [facility];  Service: Orthopedics;  Laterality: Right;   PACEMAKER INSERTION N/A 05/28/2014   Procedure: PACEMAKER INSERTION (dual chamber); Location: [facility]   PERIPHERAL IRIDOTOMY Left 03/20/2017   Procedure: PERIPHERAL IRIDECTOMY;  Surgeon: Leandrew Koyanagi, MD;  Location: [facility];  Service: Ophthalmology;  Laterality: Left;   ROTATOR CUFF REPAIR Left    TEE WITHOUT CARDIOVERSION N/A 05/16/2016   Procedure: TRANSESOPHAGEAL ECHOCARDIOGRAM (TEE);  Surgeon: Teodoro Spray, MD;  Location: [facility];  Service: Cardiovascular;  Laterality: N/A;   TONSILLECTOMY     uterine polyp     Patient Active Problem List   Diagnosis Date Noted   Total knee replacement status 12/25/2021   Prediabetes 12/14/2021   Acquired thrombophilia ([place]) 08/07/2021   Primary  osteoarthritis of right knee 04/09/2021   Swelling of limb 11/01/2020   Enlarged pulmonary artery ([place]) 05/23/2020   Moderate aortic insufficiency 03/07/2020   Intracranial carotid stenosis, right 05/29/2019   Chronic headaches 05/29/2019   Long term current use of systemic steroids 05/04/2019   Screening for osteoporosis 05/04/2019   Temporal arteritis ([place]) 04/07/2019   Meningioma ([place]) 12/29/2018   Chronic daily headache 11/20/2018   Elevated erythrocyte sedimentation rate 11/05/2018   CKD (chronic kidney disease) stage 3, GFR 30-59 ml/min ([place]) 12/27/2017   Shingles 05/06/2017   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 04/12/2017   Primary osteoarthritis of right shoulder 12/07/2016   Rotator cuff tear, right 12/07/2016   Rotator cuff tendinitis, right 12/07/2016   CHB (complete heart block) ([place]) 06/30/2016   Severe mitral regurgitation 05/29/2016   Pedal edema 04/23/2016   Lung nodule, multiple 12/28/2015   Amnesia, global, transient 04/05/2015   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 06/30/2014  Biventricular cardiac pacemaker in situ 05/18/2014   Sleep disorder 05/06/2014   Diffuse pain 04/06/2014   Fatigue 03/16/2014   Headache 03/16/2014   History of melanoma 11/24/2013   Melanoma ([place]) 11/24/2013   DCIS (ductal carcinoma in situ) 08/11/2013   History of ductal carcinoma in situ (DCIS) of breast 08/11/2013   H/O non-ST elevation myocardial infarction (NSTEMI) 01/01/2013   Hearing impairment 01/01/2013   S/P ablation of atrial fibrillation 07/11/2012   Edema 01/23/2012   Hypertension 01/23/2012   Atrial fibrillation ([place]) 11/27/2011   Coronary artery disease 11/27/2011    PCP: Adin Hector, MD  REFERRING PROVIDER: Dereck Leep, MD  REFERRING DIAGNOSIS: [phone] (ICD-10-CM) - Presence of right artificial knee joint  THERAPY DIAG: Chronic pain of right knee  Muscle weakness (generalized)  Difficulty  in walking, not elsewhere classified  RATIONALE FOR EVALUATION AND TREATMENT: Rehabilitation  ONSET DATE: 12/25/21  FOLLOW UP APPT WITH PROVIDER: Yes    SUBJECTIVE:                                                                                                                                                                                         Chief Complaint: R Knee replacement 12/25/21  Pertinent History Pt underwent R TKR 12/25/21 due to ongoing chronic pain in her knee. No reported post-operative complications with the exception of some post-operative bleeding. She has been regularly icing, elevating, and wearing compression stockings on the RLE. No compression hose upon arrival today. She had HH PT after the surgery and reports that it went well. She has continued performing her HEP including mini squats and standing hip abduction. Two falls since the surgery but denies any trauma to right knee. Husband and family have been assisting with IADLs since the surgery.  Pain:  Pain Intensity: Present: 0/10, Best: 0/10, Worst: 8/10 Pain location: R Knee Pain quality: aching  Radiating pain: No  Swelling: Yes  Numbness/Tingling: No 24-hour pain behavior: Varies throughout the day History of prior back, hip, or knee injury, pain, surgery, or therapy: No, chronic R knee pain but no prior history of R knee or hip surgery Falls: Has patient fallen in last 6 months? 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  Recent memory is intact.  Remote memory is intact.  Attention span and concentration are intact.  Expressive speech is intact.  Patient's fund of knowledge is within normal limits for educational level.     Gross Musculoskeletal Assessment Tremor: None Bulk: Mild swelling around R knee. Bandage is covering incision however pt indicates that the bandage is wet from her shower yesterday and she doesn't know if she should change the dressing. She does not have any additional dressings at home to use. Tone: Normal   GAIT: Distance walked: 200' with front wheeled walker. Antalgic with decreased stance time on RLE. Decreased self-selected gait speed but functional for limited community mobility Assistive device utilized: Walker - 2 wheeled Level of assistance: Modified independence   Posture: Forward head and rounded shoulders, mild resting R knee flexion  AROM  AROM (Normal range in degrees) AROM  01/14/2022  Lumbar   Flexion (65)   Extension (30)   Right lateral flexion (25)   Left lateral flexion (25)   Right rotation (30)   Left rotation (30)       Hip Right Left  Flexion (125)    Extension (15)    Abduction (40)    Adduction     Internal Rotation (45)    External Rotation (45)        Knee    Flexion (135) 97 135  Extension (0) -6 +2 (hyperextension)      Ankle    Dorsiflexion (20)    Plantarflexion (50)    Inversion (35)    Eversion (15    (* = pain; Blank rows = not tested)   LE MMT:  MMT (out of 5) Right 01/14/2022 Left 01/14/2022  Hip flexion 3+ 3+  Hip extension    Hip abduction    Hip adduction    Hip internal rotation    Hip external rotation    Knee flexion 5 5  Knee extension 5 5   Ankle dorsiflexion 4 4  Ankle plantarflexion Active Active  Ankle inversion    Ankle eversion    (* = pain; Blank rows = not tested)   Sensation Deferred   Reflexes Deferred  Muscle Length Hamstrings: R: Positive for decreased length at 70 degrees L: Not examined  Palpation Mild pain to palpation at medial and lateral knee. Hematoma palpated in R anteromedial thigh just proximal to the knee joint.   Passive Accessory Motion Deferred  VASCULAR Deferred   SPECIAL TESTS Deferred   TODAY'S TREATMENT  Reviewed HEP and discussed plan of care. Removed dressing from R knee (still wet from shower the prior day) and replaced with a 3 Tegaderm dressings obtained from [facility] at beginning of session.   PATIENT EDUCATION:  Education details: Plan of care, HEP Person educated: Patient and husband Education method: Explanation and handout Education comprehension: verbalized understanding   HOME EXERCISE PROGRAM: Access Code: [email or web address] URL: [email or web address] Date: 01/14/2022 Prepared by: Roxana Hires  Exercises - Supine Ankle Pumps  - 2 x daily - 7 x weekly - 2 sets - 10 reps - Supine Quadricep Sets (Mirrored)  - 2 x daily - 7 x weekly - 2 sets - 10 reps - 3s hold - Supine Active  Straight Leg Raise  - 2 x daily - 7 x weekly - 2 sets - 10 reps - Supine Heel Slides  - 2 x daily - 7 x weekly - 2 sets - 10 reps - Seated Long Arc Quad  - 2 x daily - 7 x weekly - 2 sets - 10 reps - Mini Squat with Counter Support  - 2 x daily - 7 x weekly - 2 sets - 10 reps - Standing Hip Abduction with Counter Support (Mirrored)  - 2 x daily - 7 x weekly - 2 sets - 10 reps - 3s (perform on both sides) hold - Seated Knee Flexion Stretch  - 2 x daily - 7 x weekly - 3 reps - 60s (progress to longer holds) hold - Seated Passive Knee Extension (Mirrored)  - 2 x daily - 7 x weekly - 3 reps - 60s (progress to longer) hold  Patient Education - Total Knee Replacement  Handout   ASSESSMENT:  CLINICAL IMPRESSION: Patient is a 88 y.o. female who was seen today for physical therapy evaluation and treatment s/p R TKR 12/25/21. Objective impairments include Abnormal gait, decreased balance, decreased ROM, decreased strength, and pain. At evaluation R knee AAROM is -6 to 97 degrees measured in supine. She is able to perform full LAQ in sitting however demonstrates notable extensor lag during supine SLR. She is ambulating with a front wheeled walker with decreased stance time on RLE and decreased self-selected gait speed. Mild swelling noted in R knee but no signs of infection present. Calf is tender and compressible without any pain. These impairments are limiting patient from meal prep, cleaning, laundry, driving, shopping, and community activity. Personal factors including Age, Past/current experiences, Time since onset of injury/illness/exacerbation, and 3+ comorbidities: CAD, CKD, OA, and Afib  are also affecting patient's functional outcome. Patient will benefit from skilled PT to address above impairments and improve overall function.  REHAB POTENTIAL: Excellent  CLINICAL DECISION MAKING: Evolving/moderate complexity  EVALUATION COMPLEXITY: Low   GOALS: Goals reviewed with patient? Yes  SHORT TERM GOALS: Target date: 02/23/2022   Pt will be independent with HEP to improve strength and decrease knee pain to improve pain-free function at home. Baseline:  Goal status: INITIAL   LONG TERM GOALS: Target date: 04/06/2022   Pt will increase FOTO to at least 57 to demonstrate significant improvement in function at home and work related to knee pain  Baseline: 01/12/22: 37 Goal status: INITIAL  2.  Pt will decrease worst knee pain by at least 3 points on the NPRS in order to demonstrate clinically significant reduction in back pain. Baseline: 01/12/22: worst 8/10 Goal status: INITIAL  3.  Pt will increase strength of R quad (perform 10 full SLR without quad  lag) in order to demonstrate improvement in strength and function related to R TKR  Baseline: 01/12/22: Able to perform SLR but with notable quad lag Goal status: INITIAL  4.  Pt will increase R knee AAROM to 0 degrees extension and at least 120 degrees of flexion in order to improve function with gait and stairs. Baseline: 01/12/22: Able to perform SLR but with notable quad lag Goal status: INITIAL   PLAN: PT FREQUENCY: 2x/week  PT DURATION: 12 weeks  PLANNED INTERVENTIONS: Therapeutic exercises, Therapeutic activity, Neuromuscular re-education, Balance training, Gait training, Patient/Family education, Joint manipulation, Joint mobilization, Canalith repositioning, Aquatic Therapy, Dry Needling, Electrical stimulation, Spinal manipulation, Spinal mobilization, Cryotherapy, Moist heat, Taping, Traction, Ultrasound, Ionotophoresis '4mg'$ /ml Dexamethasone, and Manual therapy  PLAN FOR NEXT SESSION: 10MWT, TUG, complete ABC, 5TSTS, test sensation, review/modify HEP, initiate strengthening and stretching.  Lyndel Safe Starlynn Klinkner PT, DPT, GCS  Tavonte Seybold 01/14/2022, 2:57 PM

## 2022-01-14 NOTE — Therapy (Signed)
OUTPATIENT PHYSICAL THERAPY KNEE TREATMENT   Patient Name: Charlotte Glover MRN: [id number] DOB:[birth year], 88 y.o., female Today's Date: 01/16/2022   PT End of Session - 01/16/22 1019     Visit Number 2    Number of Visits 25    Date for PT Re-Evaluation 04/06/22    Authorization Type eval: 01/12/22    PT Start Time 1015    PT Stop Time 1100    PT Time Calculation (min) 45 min    Activity Tolerance Patient tolerated treatment well    Behavior During Therapy [facility] for tasks assessed/performed              Past Medical History:  Diagnosis Date   (HFpEF) heart failure with preserved ejection fraction ([place]) 06/21/2020   a.) TTE 06/21/2020: EF >55%, RAE, G1DD   Amaurosis fugax of left eye    Arthritis    Atrial fibrillation ([place])    a.) Dx'd in 2009; b.) CHA2DS2-VASc = 6 (age x 2, sex, CHF, HTN, prior MI); c.) s/p PVI 09/31/2013; DCCV (200 J x 1) 04/02/2012, repeat PVI with roof line, coronary sinus isolation, and CFAE ablation 01/02/2013; DCCV 01/18/2013 (200 J x 1); AV nodal ablation 05/28/2014;  d.) rate/rhythm maintained without pharmacological intervention; chronically anticoagulated using rivaroxaban   Cardiac arrest ([place]) 10/2009   a.) witnessed arrest at home; husband performed CPR; ROSC achieved in the field. Admitted for workup --> etiology of CV event undetermined; felt to be secondary to "electrolyte abnormalities".   Cardiac murmur    CHB (complete heart block) ([place])    a.) s/p PPM 05/28/2014; b.) CRT-P upgrade 07/13/2016.   Chickenpox    CKD (chronic kidney disease), stage III (HCC)    Coronary artery disease    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/13/2013   a.) grade II; ER/PR (+)   Dyspnea    Edema    GERD (gastroesophageal reflux disease)    Granulomatous disease ([place])    a.) spleen   Hiatal hernia    HLD (hyperlipidemia)    HOH (hard of hearing)    Hypertension 01/23/2012   [place] esophagus    Long term current use of  antithrombotics/antiplatelets    a.) rivaroxaban   Melanoma of lower leg, left (HCC)    Meningioma, multiple ([place]) 07/14/2019   a.) MRI brain 07/14/2019: 1.2 x 1.3 x 1.3 cm midline posterior fossa meningioma inferior to the cerebellar vertex; 7 x 3 mm dural based meningioma along frontal convexity   NSTEMI (non-ST elevated myocardial infarction) ([place]) 08/2009   Presence of permanent cardiac pacemaker    Pulmonary HTN ([place]) 05/08/2016   a.) R/LHC 05/08/2016: EF >55, LVEDP 15, mean PA 38, AO sat 97, RVSP 50, CO 3 L/min   S/P ablation of atrial fibrillation    Shingles    SSS (sick sinus syndrome) ([place])    a.) CRT-P in place   Valvular heart disease 02/08/2012   a.) cMRI 02/08/2012: EF 50-55, mild-mod AR, mod MR; b.) TEE 02/26/2012: EF >55, mild MR/AR/TR; c.) R/LHC 05/08/2016: sev MR; d.) TEE 06/14/2016: EF >55, sev MR, mod AR/TR; e.) TTE 10/05/2016: EF >55, triv PR, mod AR/MR/TR; f.) TTE 04/22/2018: EF >55, mild MR/TR/PR, mod AR; g.) TTE 11/27/2019: EF 50, mild PR, mod AR/MR/TR; h.) TTE 06/21/2020: EF >55, triv PR, mod AR/MR/TR   Wears hearing aid in both ears    Past Surgical History:  Procedure Laterality Date   ANTERIOR VITRECTOMY Left 03/20/2017   Procedure: ANTERIOR VITRECTOMY;  Surgeon: Leandrew Koyanagi, MD;  Location: [facility];  Service: Ophthalmology;  Laterality: Left;  IVA TOPICAL LEFT   ARTERY BIOPSY Right 04/08/2019   Procedure: BIOPSY TEMPORAL ARTERY;  Surgeon: Algernon Huxley, MD;  Location: [facility];  Service: Vascular;  Laterality: Right;   ATRIAL FIBRILLATION ABLATION N/A 03/24/2012   Procedure: ATRIAL FIBRILLATION ABLATION (PVI); Location: [facility]   ATRIAL FIBRILLATION ABLATION N/A 01/02/2013   Procedure: ATRIAL FIBRILLATION ABLATION (PVI, roof line, coronary sinus siolation, CFAE ablation); Location: [facility]   AV NODE ABLATION N/A 05/26/2014   Procedure: AV NODE ABLATION; Location: [facility]   BIV PACEMAKER INSERTION CRT-P N/A 07/13/2016   Procedure: CRT-P BiV  PACEMAKER UPGRADE; Location: [facility]   BREAST BIOPSY Right 2015   + for DCIS    BREAST SURGERY     CARDIAC CATHETERIZATION Bilateral 05/08/2016   Procedure: Right/Left Heart Cath and Coronary Angiography;  Surgeon: Isaias Cowman, MD;  Location: [facility];  Service: Cardiovascular;  Laterality: Bilateral;   CARDIOVERSION N/A 04/02/2012   Procedure: CARDIOVERSION; Location: [facility]   CARDIOVERSION N/A 01/15/2013   Procedure: CARDIOVERSION; Location: [facility]   CATARACT EXTRACTION W/PHACO Left 03/20/2017   Procedure: IOL EXCHANGE;  Surgeon: Leandrew Koyanagi, MD;  Location: [facility];  Service: Ophthalmology;  Laterality: Left;   CHOLECYSTECTOMY     COLONOSCOPY     patient reports several   DILATION AND CURETTAGE OF UTERUS     ESOPHAGOGASTRODUODENOSCOPY     ESOPHAGOGASTRODUODENOSCOPY (EGD) WITH PROPOFOL N/A 12/13/2020   Procedure: ESOPHAGOGASTRODUODENOSCOPY (EGD) WITH PROPOFOL;  Surgeon: Lesly Rubenstein, MD;  Location: [facility];  Service: Endoscopy;  Laterality: N/A;   KNEE ARTHROPLASTY Right 12/25/2021   Procedure: COMPUTER ASSISTED TOTAL KNEE ARTHROPLASTY;  Surgeon: Dereck Leep, MD;  Location: [facility];  Service: Orthopedics;  Laterality: Right;   PACEMAKER INSERTION N/A 05/28/2014   Procedure: PACEMAKER INSERTION (dual chamber); Location: [facility]   PERIPHERAL IRIDOTOMY Left 03/20/2017   Procedure: PERIPHERAL IRIDECTOMY;  Surgeon: Leandrew Koyanagi, MD;  Location: [facility];  Service: Ophthalmology;  Laterality: Left;   ROTATOR CUFF REPAIR Left    TEE WITHOUT CARDIOVERSION N/A 05/16/2016   Procedure: TRANSESOPHAGEAL ECHOCARDIOGRAM (TEE);  Surgeon: Teodoro Spray, MD;  Location: [facility];  Service: Cardiovascular;  Laterality: N/A;   TONSILLECTOMY     uterine polyp     Patient Active Problem List   Diagnosis Date Noted   Total knee replacement status 12/25/2021   Prediabetes 12/14/2021   Acquired thrombophilia ([place]) 08/07/2021   Primary  osteoarthritis of right knee 04/09/2021   Swelling of limb 11/01/2020   Enlarged pulmonary artery ([place]) 05/23/2020   Moderate aortic insufficiency 03/07/2020   Intracranial carotid stenosis, right 05/29/2019   Chronic headaches 05/29/2019   Long term current use of systemic steroids 05/04/2019   Screening for osteoporosis 05/04/2019   Temporal arteritis ([place]) 04/07/2019   Meningioma ([place]) 12/29/2018   Chronic daily headache 11/20/2018   Elevated erythrocyte sedimentation rate 11/05/2018   CKD (chronic kidney disease) stage 3, GFR 30-59 ml/min ([place]) 12/27/2017   Shingles 05/06/2017   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 04/12/2017   Primary osteoarthritis of right shoulder 12/07/2016   Rotator cuff tear, right 12/07/2016   Rotator cuff tendinitis, right 12/07/2016   CHB (complete heart block) ([place]) 06/30/2016   Severe mitral regurgitation 05/29/2016   Pedal edema 04/23/2016   Lung nodule, multiple 12/28/2015   Amnesia, global, transient 04/05/2015   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 06/30/2014  Biventricular cardiac pacemaker in situ 05/18/2014   Sleep disorder 05/06/2014   Diffuse pain 04/06/2014   Fatigue 03/16/2014   Headache 03/16/2014   History of melanoma 11/24/2013   Melanoma ([place]) 11/24/2013   DCIS (ductal carcinoma in situ) 08/11/2013   History of ductal carcinoma in situ (DCIS) of breast 08/11/2013   H/O non-ST elevation myocardial infarction (NSTEMI) 01/01/2013   Hearing impairment 01/01/2013   S/P ablation of atrial fibrillation 07/11/2012   Edema 01/23/2012   Hypertension 01/23/2012   Atrial fibrillation ([place]) 11/27/2011   Coronary artery disease 11/27/2011    PCP: Adin Hector, MD  REFERRING PROVIDER: Dereck Leep, MD  REFERRING DIAGNOSIS: [phone] (ICD-10-CM) - Presence of right artificial knee joint  THERAPY DIAG: Chronic pain of right knee  Muscle weakness (generalized)  Difficulty  in walking, not elsewhere classified  RATIONALE FOR EVALUATION AND TREATMENT: Rehabilitation  ONSET DATE: 12/25/21  FOLLOW UP APPT WITH PROVIDER: Yes    FROM INITIAL EVALUATION  SUBJECTIVE:                                                                                                                                                                                         Chief Complaint: R Knee replacement 12/25/21  Pertinent History Pt underwent R TKR 12/25/21 due to ongoing chronic pain in her knee. No reported post-operative complications with the exception of some post-operative bleeding. She has been regularly icing, elevating, and wearing compression stockings on the RLE. No compression hose upon arrival today. She had HH PT after the surgery and reports that it went well. She has continued performing her HEP including mini squats and standing hip abduction. Two falls since the surgery but denies any trauma to right knee. Husband and family have been assisting with IADLs since the surgery.  Pain:  Pain Intensity: Present: 0/10, Best: 0/10, Worst: 8/10 Pain location: R Knee Pain quality: aching  Radiating pain: No  Swelling: Yes  Numbness/Tingling: No 24-hour pain behavior: Varies throughout the day History of prior back, hip, or knee injury, pain, surgery, or therapy: No, chronic R knee pain but no prior history of R knee or hip surgery Falls: Has patient fallen in last 6 months? 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  Recent memory is intact.  Remote memory is intact.  Attention span and concentration are intact.  Expressive speech is intact.  Patient's fund of knowledge is within normal limits for educational level.     Gross Musculoskeletal Assessment Tremor: None Bulk: Mild swelling around R knee. Bandage is covering incision however pt indicates that the bandage is wet from her shower yesterday and she doesn't know if she should change the dressing. She does not have any additional dressings at home to use. Tone: Normal   GAIT: Distance walked: 200' with front wheeled walker. Antalgic with decreased stance time on RLE. Decreased self-selected gait speed but functional for limited community mobility Assistive device utilized: Walker - 2 wheeled Level of assistance: Modified independence   Posture: Forward head and rounded shoulders, mild resting R knee flexion  AROM  AROM (Normal range in degrees) AROM  01/16/2022  Lumbar   Flexion (65)   Extension (30)   Right lateral flexion (25)   Left lateral flexion (25)   Right rotation (30)   Left rotation (30)       Hip Right Left  Flexion (125)    Extension (15)    Abduction (40)    Adduction     Internal Rotation (45)    External Rotation (45)        Knee    Flexion (135) 97 135  Extension (0) -6 +2 (hyperextension)      Ankle    Dorsiflexion (20)    Plantarflexion (50)    Inversion (35)    Eversion (15    (* = pain; Blank rows = not tested)   LE MMT:  MMT (out of 5) Right 01/16/2022 Left 01/16/2022  Hip flexion 3+ 3+  Hip extension    Hip abduction    Hip adduction    Hip internal rotation    Hip external rotation     Knee flexion 5 5  Knee extension 5 5  Ankle dorsiflexion 4 4  Ankle plantarflexion Active Active  Ankle inversion    Ankle eversion    (* = pain; Blank rows = not tested)   Sensation Deferred   Reflexes Deferred  Muscle Length Hamstrings: R: Positive for decreased length at 70 degrees L: Not examined  Palpation Mild pain to palpation at medial and lateral knee. Hematoma palpated in R anteromedial thigh just proximal to the knee joint.   Passive Accessory Motion Deferred  VASCULAR Deferred   SPECIAL TESTS Deferred   TODAY'S TREATMENT (01/16/22)  SUBJECTIVE: Pt reports that she is doing well today. She denies any resting R knee pain upon arrival. She feels like the swelling is going down in her R knee. She confirms that she is wearing her compression stockings at home. No specific questions or concerns currently.  PAIN: Denies  Ther-ex  NuStep L1-2 x 5 minutes for warm-up during interval history (3 minutes unbilled);  Updated additional outcome measures with patient: 5TSTS: Pt unable to stand without heavy UE assist; 10MWT: self-selected: 13.3s = 0.75 m/s TUG: 18.8s ABC: 76.9% Tested sensation: WNL bilaterally L2-S2;  R knee AAROM: -4 - 112 degrees  Vitals: BP: 131/44 mmHg, HR: 70 bpm, SpO2: 100%  Supine R  quad set with heel on towel roll to stretch into extension with 3s hold 2 x 10; Supine R SLR hip flexion initiating with quad set 2 x 10; Supine R straight leg hip abduction x 10, with light manual resistance from therapist;  Hooklying clams 2 x 10 BLE, with manual resistance from therapist Hooklying adductor squeeze 2 x 10 BLE, with manual resistance from therapist; Hooklying bridges with arms at side 2 x 10; Seated R LAQ 2 x 10;   Manual Therapy  R posterior knee gentle STM due to hamstring soreness; R hamstring stretch x 60s;   PATIENT EDUCATION:  Education details: Pt educated throughout session about proper posture and technique with  exercises. Improved exercise technique, movement at target joints, use of target muscles after min to mod verbal, visual, tactile cues.  Person educated: Patient Education method: Explanation Education comprehension: verbalized understanding   HOME EXERCISE PROGRAM: Access Code: [email or web address] URL: [email or web address] Date: 01/14/2022 Prepared by: Roxana Hires  Exercises - Supine Ankle Pumps  - 2 x daily - 7 x weekly - 2 sets - 10 reps - Supine Quadricep Sets (Mirrored)  - 2 x daily - 7 x weekly - 2 sets - 10 reps - 3s hold - Supine Active Straight Leg Raise  - 2 x daily - 7 x weekly - 2 sets - 10 reps - Supine Heel Slides  - 2 x daily - 7 x weekly - 2 sets - 10 reps - Seated Long Arc Quad  - 2 x daily - 7 x weekly - 2 sets - 10 reps - Mini Squat with Counter Support  - 2 x daily - 7 x weekly - 2 sets - 10 reps - Standing Hip Abduction with Counter Support (Mirrored)  - 2 x daily - 7 x weekly - 2 sets - 10 reps - 3s (perform on both sides) hold - Seated Knee Flexion Stretch  - 2 x daily - 7 x weekly - 3 reps - 60s (progress to longer holds) hold - Seated Passive Knee Extension (Mirrored)  - 2 x daily - 7 x weekly - 3 reps - 60s (progress to longer) hold  Patient Education - Total Knee Replacement Handout   ASSESSMENT:  CLINICAL IMPRESSION: Patient with less swelling today around R knee compared to initial evaluation. She reports that she doesn't have any more dressings at home and has tried to call [facility] but is having trouble talking to someone on the phone. Therapist advised pt to go next door to [facility] to talk to someone in person at the front desk. Additional outcome measures performed with patient during visit today. She continues to demonstrate significant BLE weakness and is unable to transition from sit to stand without heavy UE assist. Her 19mgait speed and TUG also demonstrate deficits in strength and balance. ABC Scale indicates normal balance  confidence. R knee AAROM is improved from initial evaluation and is -4 - 112 degrees today. Initiated strengthening and manual techniques with patient during visit today and she demonstrates improving R quad contraction. Pt encouraged to continue HEP. She will benefit from skilled PT to address above impairments and improve overall function.  REHAB POTENTIAL: Excellent  CLINICAL DECISION MAKING: Evolving/moderate complexity  EVALUATION COMPLEXITY: Low   GOALS: Goals reviewed with patient? Yes  SHORT TERM GOALS: Target date: 02/23/2022   Pt will be independent with HEP to improve strength and decrease knee pain to improve pain-free function at home. Baseline:  Goal status: INITIAL  LONG TERM GOALS: Target date: 04/06/2022   Pt will increase FOTO to at least 57 to demonstrate significant improvement in function at home and work related to knee pain  Baseline: 01/12/22: 37 Goal status: INITIAL  2.  Pt will decrease worst knee pain by at least 3 points on the NPRS in order to demonstrate clinically significant reduction in back pain. Baseline: 01/12/22: worst 8/10 Goal status: INITIAL  3.  Pt will increase strength of R quad (perform 10 full SLR without quad lag) in order to demonstrate improvement in strength and function related to R TKR  Baseline: 01/12/22: Able to perform SLR but with notable quad lag Goal status: INITIAL  4.  Pt will increase R knee AAROM to 0 degrees extension and at least 120 degrees of flexion in order to improve function with gait and stairs. Baseline: 01/12/22: -6 to 97 Goal status: INITIAL  5.  Pt will be able to perform sit to stand from regular height chair without UE assist in order to demonstrate improved function related to increase in BLE strength. Baseline: 01/12/22: Unable to perform sit to stand without heavy UE assist Goal status: INITIAL   PLAN: PT FREQUENCY: 2x/week  PT DURATION: 12 weeks  PLANNED INTERVENTIONS: Therapeutic exercises,  Therapeutic activity, Neuromuscular re-education, Balance training, Gait training, Patient/Family education, Joint manipulation, Joint mobilization, Canalith repositioning, Aquatic Therapy, Dry Needling, Electrical stimulation, Spinal manipulation, Spinal mobilization, Cryotherapy, Moist heat, Taping, Traction, Ultrasound, Ionotophoresis '4mg'$ /ml Dexamethasone, and Manual therapy  PLAN FOR NEXT SESSION:  review/modify HEP as needed, continue strengthening and stretching.  Lyndel Safe Jora Galluzzo PT, DPT, GCS  Charlotte Glover 01/16/2022, 1:03 PM

## 2022-01-16 ENCOUNTER — Ambulatory Visit: Payer: Medicare Other

## 2022-01-16 DIAGNOSIS — R262 Difficulty in walking, not elsewhere classified: Secondary | ICD-10-CM

## 2022-01-16 DIAGNOSIS — M25561 Pain in right knee: Secondary | ICD-10-CM | POA: Diagnosis not present

## 2022-01-16 DIAGNOSIS — M6281 Muscle weakness (generalized): Secondary | ICD-10-CM

## 2022-01-16 DIAGNOSIS — G8929 Other chronic pain: Secondary | ICD-10-CM

## 2022-01-18 ENCOUNTER — Ambulatory Visit: Payer: Medicare Other

## 2022-01-18 DIAGNOSIS — R262 Difficulty in walking, not elsewhere classified: Secondary | ICD-10-CM

## 2022-01-18 DIAGNOSIS — M6281 Muscle weakness (generalized): Secondary | ICD-10-CM

## 2022-01-18 DIAGNOSIS — M25561 Pain in right knee: Secondary | ICD-10-CM | POA: Diagnosis not present

## 2022-01-18 DIAGNOSIS — G8929 Other chronic pain: Secondary | ICD-10-CM

## 2022-01-18 NOTE — Therapy (Signed)
OUTPATIENT PHYSICAL THERAPY KNEE TREATMENT   Patient Name: Charlotte Glover MRN: [id number] DOB:03/03/1928, 88 y.o., female Today's Date: 01/18/2022   PT End of Session - 01/18/22 1127     Visit Number 3    Number of Visits 25    Date for PT Re-Evaluation 04/06/22    Authorization Type eval: 01/12/22    PT Start Time 1015    PT Stop Time 1105    PT Time Calculation (min) 50 min    Activity Tolerance Patient tolerated treatment well    Behavior During Therapy [facility] for tasks assessed/performed               Past Medical History:  Diagnosis Date   (HFpEF) heart failure with preserved ejection fraction ([place]) 06/21/2020   a.) TTE 06/21/2020: EF >55%, RAE, G1DD   Amaurosis fugax of left eye    Arthritis    Atrial fibrillation ([place])    a.) Dx'd in 2009; b.) CHA2DS2-VASc = 6 (age x 2, sex, CHF, HTN, prior MI); c.) s/p PVI 09/31/2013; DCCV (200 J x 1) 04/02/2012, repeat PVI with roof line, coronary sinus isolation, and CFAE ablation 01/02/2013; DCCV 01/18/2013 (200 J x 1); AV nodal ablation 05/28/2014;  d.) rate/rhythm maintained without pharmacological intervention; chronically anticoagulated using rivaroxaban   Cardiac arrest ([place]) 10/2009   a.) witnessed arrest at home; husband performed CPR; ROSC achieved in the field. Admitted for workup --> etiology of CV event undetermined; felt to be secondary to "electrolyte abnormalities".   Cardiac murmur    CHB (complete heart block) ([place])    a.) s/p PPM 05/28/2014; b.) CRT-P upgrade 07/13/2016.   Chickenpox    CKD (chronic kidney disease), stage III (HCC)    Coronary artery disease    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/13/2013   a.) grade II; ER/PR (+)   Dyspnea    Edema    GERD (gastroesophageal reflux disease)    Granulomatous disease ([place])    a.) spleen   Hiatal hernia    HLD (hyperlipidemia)    HOH (hard of hearing)    Hypertension 01/23/2012   [place] esophagus    Long term current use of  antithrombotics/antiplatelets    a.) rivaroxaban   Melanoma of lower leg, left (HCC)    Meningioma, multiple ([place]) 07/14/2019   a.) MRI brain 07/14/2019: 1.2 x 1.3 x 1.3 cm midline posterior fossa meningioma inferior to the cerebellar vertex; 7 x 3 mm dural based meningioma along frontal convexity   NSTEMI (non-ST elevated myocardial infarction) ([place]) 08/2009   Presence of permanent cardiac pacemaker    Pulmonary HTN ([place]) 05/08/2016   a.) R/LHC 05/08/2016: EF >55, LVEDP 15, mean PA 38, AO sat 97, RVSP 50, CO 3 L/min   S/P ablation of atrial fibrillation    Shingles    SSS (sick sinus syndrome) ([place])    a.) CRT-P in place   Valvular heart disease 02/08/2012   a.) cMRI 02/08/2012: EF 50-55, mild-mod AR, mod MR; b.) TEE 02/26/2012: EF >55, mild MR/AR/TR; c.) R/LHC 05/08/2016: sev MR; d.) TEE 06/14/2016: EF >55, sev MR, mod AR/TR; e.) TTE 10/05/2016: EF >55, triv PR, mod AR/MR/TR; f.) TTE 04/22/2018: EF >55, mild MR/TR/PR, mod AR; g.) TTE 11/27/2019: EF 50, mild PR, mod AR/MR/TR; h.) TTE 06/21/2020: EF >55, triv PR, mod AR/MR/TR   Wears hearing aid in both ears    Past Surgical History:  Procedure Laterality Date   ANTERIOR VITRECTOMY Left 03/20/2017   Procedure: ANTERIOR  VITRECTOMY;  Surgeon: Leandrew Koyanagi, MD;  Location: [facility];  Service: Ophthalmology;  Laterality: Left;  IVA TOPICAL LEFT   ARTERY BIOPSY Right 04/08/2019   Procedure: BIOPSY TEMPORAL ARTERY;  Surgeon: Algernon Huxley, MD;  Location: [facility];  Service: Vascular;  Laterality: Right;   ATRIAL FIBRILLATION ABLATION N/A 03/24/2012   Procedure: ATRIAL FIBRILLATION ABLATION (PVI); Location: [facility]   ATRIAL FIBRILLATION ABLATION N/A 01/02/2013   Procedure: ATRIAL FIBRILLATION ABLATION (PVI, roof line, coronary sinus siolation, CFAE ablation); Location: [facility]   AV NODE ABLATION N/A 05/26/2014   Procedure: AV NODE ABLATION; Location: [facility]   BIV PACEMAKER INSERTION CRT-P N/A 07/13/2016   Procedure: CRT-P BiV  PACEMAKER UPGRADE; Location: [facility]   BREAST BIOPSY Right 2015   + for DCIS    BREAST SURGERY     CARDIAC CATHETERIZATION Bilateral 05/08/2016   Procedure: Right/Left Heart Cath and Coronary Angiography;  Surgeon: Isaias Cowman, MD;  Location: [facility];  Service: Cardiovascular;  Laterality: Bilateral;   CARDIOVERSION N/A 04/02/2012   Procedure: CARDIOVERSION; Location: [facility]   CARDIOVERSION N/A 01/15/2013   Procedure: CARDIOVERSION; Location: [facility]   CATARACT EXTRACTION W/PHACO Left 03/20/2017   Procedure: IOL EXCHANGE;  Surgeon: Leandrew Koyanagi, MD;  Location: [facility];  Service: Ophthalmology;  Laterality: Left;   CHOLECYSTECTOMY     COLONOSCOPY     patient reports several   DILATION AND CURETTAGE OF UTERUS     ESOPHAGOGASTRODUODENOSCOPY     ESOPHAGOGASTRODUODENOSCOPY (EGD) WITH PROPOFOL N/A 12/13/2020   Procedure: ESOPHAGOGASTRODUODENOSCOPY (EGD) WITH PROPOFOL;  Surgeon: Lesly Rubenstein, MD;  Location: [facility];  Service: Endoscopy;  Laterality: N/A;   KNEE ARTHROPLASTY Right 12/25/2021   Procedure: COMPUTER ASSISTED TOTAL KNEE ARTHROPLASTY;  Surgeon: Dereck Leep, MD;  Location: [facility];  Service: Orthopedics;  Laterality: Right;   PACEMAKER INSERTION N/A 05/28/2014   Procedure: PACEMAKER INSERTION (dual chamber); Location: [facility]   PERIPHERAL IRIDOTOMY Left 03/20/2017   Procedure: PERIPHERAL IRIDECTOMY;  Surgeon: Leandrew Koyanagi, MD;  Location: [facility];  Service: Ophthalmology;  Laterality: Left;   ROTATOR CUFF REPAIR Left    TEE WITHOUT CARDIOVERSION N/A 05/16/2016   Procedure: TRANSESOPHAGEAL ECHOCARDIOGRAM (TEE);  Surgeon: Teodoro Spray, MD;  Location: [facility];  Service: Cardiovascular;  Laterality: N/A;   TONSILLECTOMY     uterine polyp     Patient Active Problem List   Diagnosis Date Noted   Total knee replacement status 12/25/2021   Prediabetes 12/14/2021   Acquired thrombophilia ([place]) 08/07/2021   Primary  osteoarthritis of right knee 04/09/2021   Swelling of limb 11/01/2020   Enlarged pulmonary artery ([place] [facility] [place]) 05/23/2020   Moderate aortic insufficiency 03/07/2020   Intracranial carotid stenosis, right 05/29/2019   Chronic headaches 05/29/2019   Long term current use of systemic steroids 05/04/2019   Screening for osteoporosis 05/04/2019   Temporal arteritis ([place]) 04/07/2019   Meningioma ([place]) 12/29/2018   Chronic daily headache 11/20/2018   Elevated erythrocyte sedimentation rate 11/05/2018   CKD (chronic kidney disease) stage 3, GFR 30-59 ml/min (HCC) 12/27/2017   Shingles 05/06/2017   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 04/12/2017   Primary osteoarthritis of right shoulder 12/07/2016   Rotator cuff tear, right 12/07/2016   Rotator cuff tendinitis, right 12/07/2016   CHB (complete heart block) ([place]) 06/30/2016   Severe mitral regurgitation 05/29/2016   Pedal edema 04/23/2016   Lung nodule, multiple 12/28/2015   Amnesia, global, transient 04/05/2015   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  06/30/2014   Biventricular cardiac pacemaker in situ 05/18/2014   Sleep disorder 05/06/2014   Diffuse pain 04/06/2014   Fatigue 03/16/2014   Headache 03/16/2014   History of melanoma 11/24/2013   Melanoma ([place]) 11/24/2013   DCIS (ductal carcinoma in situ) 08/11/2013   History of ductal carcinoma in situ (DCIS) of breast 08/11/2013   H/O non-ST elevation myocardial infarction (NSTEMI) 01/01/2013   Hearing impairment 01/01/2013   S/P ablation of atrial fibrillation 07/11/2012   Edema 01/23/2012   Hypertension 01/23/2012   Atrial fibrillation ([place]) 11/27/2011   Coronary artery disease 11/27/2011    PCP: Adin Hector, MD  REFERRING PROVIDER: Dereck Leep, MD  REFERRING DIAGNOSIS: [phone] (ICD-10-CM) - Presence of right artificial knee joint  THERAPY DIAG: Chronic pain of right knee  Muscle weakness (generalized)  Difficulty  in walking, not elsewhere classified  RATIONALE FOR EVALUATION AND TREATMENT: Rehabilitation  ONSET DATE: 12/25/21  FOLLOW UP APPT WITH PROVIDER: Yes    FROM INITIAL EVALUATION  SUBJECTIVE:                                                                                                                                                                                         Chief Complaint: R Knee replacement 12/25/21  Pertinent History Pt underwent R TKR 12/25/21 due to ongoing chronic pain in her knee. No reported post-operative complications with the exception of some post-operative bleeding. She has been regularly icing, elevating, and wearing compression stockings on the RLE. No compression hose upon arrival today. She had HH PT after the surgery and reports that it went well. She has continued performing her HEP including mini squats and standing hip abduction. Two falls since the surgery but denies any trauma to right knee. Husband and family have been assisting with IADLs since the surgery.  Pain:  Pain Intensity: Present: 0/10, Best: 0/10, Worst: 8/10 Pain location: R Knee Pain quality: aching  Radiating pain: No  Swelling: Yes  Numbness/Tingling: No 24-hour pain behavior: Varies throughout the day History of prior back, hip, or knee injury, pain, surgery, or therapy: No, chronic R knee pain but no prior history of R knee or hip surgery Falls: Has patient fallen in last 6 months? 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  Recent memory is intact.  Remote memory is intact.  Attention span and concentration are intact.  Expressive speech is intact.  Patient's fund of knowledge is within normal limits for educational level.     Gross Musculoskeletal Assessment Tremor: None Bulk: Mild swelling around R knee. Bandage is covering incision however pt indicates that the bandage is wet from her shower yesterday and she doesn't know if she should change the dressing. She does not have any additional dressings at home to use. Tone: Normal   GAIT: Distance walked: 200' with front wheeled walker. Antalgic with decreased stance time on RLE. Decreased self-selected gait speed but functional for limited community mobility Assistive device utilized: Walker - 2 wheeled Level of assistance: Modified independence   Posture: Forward head and rounded shoulders, mild resting R knee flexion  AROM  AROM (Normal range in degrees) AROM  01/18/2022  Lumbar   Flexion (65)   Extension (30)   Right lateral flexion (25)   Left lateral flexion (25)   Right rotation (30)   Left rotation (30)       Hip Right Left  Flexion (125)    Extension (15)    Abduction (40)    Adduction     Internal Rotation (45)    External Rotation (45)        Knee    Flexion (135) 97 135  Extension (0) -6 +2 (hyperextension)      Ankle    Dorsiflexion (20)    Plantarflexion (50)    Inversion (35)    Eversion (15    (* = pain; Blank rows = not tested)   LE MMT:  MMT (out of 5) Right 01/18/2022 Left 01/18/2022  Hip flexion 3+ 3+  Hip extension    Hip abduction    Hip adduction    Hip internal rotation    Hip external rotation     Knee flexion 5 5  Knee extension 5 5  Ankle dorsiflexion 4 4  Ankle plantarflexion Active Active  Ankle inversion    Ankle eversion    (* = pain; Blank rows = not tested)   Sensation Deferred   Reflexes Deferred  Muscle Length Hamstrings: R: Positive for decreased length at 70 degrees L: Not examined  Palpation Mild pain to palpation at medial and lateral knee. Hematoma palpated in R anteromedial thigh just proximal to the knee joint.   Passive Accessory Motion Deferred  VASCULAR Deferred   SPECIAL TESTS Deferred   TODAY'S TREATMENT (01/16/22)  SUBJECTIVE: Pt reports that she is doing well today. She denies any resting R knee pain upon arrival. HEP is going well without issue. No specific questions or concerns currently.  PAIN: Denies   Personnel officer Practiced heel/toe pattern in gym with verbal cues as well as demonstration. Verbal cues provided to improve RLE step length and increase R terminal knee extension prior to initial contact. Total Ambulation distance approximately 100';   Ther-ex  NuStep L1-2 x 5 minutes for warm-up during interval history (2 minutes unbilled); Standing mini squats x 10, mirror feedback for equal weight distribution; Standing heel raises with R quad set x 10; Seated R LAQ with light  manual resistance by therapist x 10; Seated R hamstring curls with red tband x 10; Standing hip abduction x 10 BLE; Standing R TKE with red tband resistance x 10; Supine R quad set with heel on towel roll to stretch into extension with 3s hold x 10; Supine R SLR hip flexion initiating with quad set x 10; Hooklying clams 2 x 10 BLE, with manual resistance from therapist Hooklying adductor squeeze 2 x 10 BLE, with manual resistance from therapist; Hooklying bridges with arms at side 2 x 10;   Manual Therapy  Supine R knee extension stretch with heel on towel roll and therapist providing gentle STM to R posterior knee/hamstrings, pt with poor tolerance  for extension stretch and requires repeated breaks; Supine R patella superior and inferior grade I-II mobilizations, 3 x 30s each direction, therapist taking care to approximate edges of scar and not provide excessive tension; Supine R knee flexion stretch with pt performing active heel slide and therapist providing gentle overpressure at end range flexion 3 x 30s; Cold pack applied to R knee at end of session with heel elevated on towel roll to stretch into extension x 5 minutes (unbilled);   R knee AAROM at end of session: -3 - 115 degrees   Not performed today: Supine R straight leg hip abduction x 10, with light manual resistance from therapist;     PATIENT EDUCATION:  Education details: Pt educated throughout session about proper posture and technique with exercises. Improved exercise technique, movement at target joints, use of target muscles after min to mod verbal, visual, tactile cues.  Person educated: Patient Education method: Explanation Education comprehension: verbalized understanding   HOME EXERCISE PROGRAM: Access Code: [email or web address] URL: [email or web address] Date: 01/14/2022 Prepared by: Roxana Hires  Exercises - Supine Ankle Pumps  - 2 x daily - 7 x weekly - 2 sets - 10 reps - Supine Quadricep Sets (Mirrored)  - 2 x daily - 7 x weekly - 2 sets - 10 reps - 3s hold - Supine Active Straight Leg Raise  - 2 x daily - 7 x weekly - 2 sets - 10 reps - Supine Heel Slides  - 2 x daily - 7 x weekly - 2 sets - 10 reps - Seated Long Arc Quad  - 2 x daily - 7 x weekly - 2 sets - 10 reps - Mini Squat with Counter Support  - 2 x daily - 7 x weekly - 2 sets - 10 reps - Standing Hip Abduction with Counter Support (Mirrored)  - 2 x daily - 7 x weekly - 2 sets - 10 reps - 3s (perform on both sides) hold - Seated Knee Flexion Stretch  - 2 x daily - 7 x weekly - 3 reps - 60s (progress to longer holds) hold - Seated Passive Knee Extension (Mirrored)  - 2 x daily - 7 x  weekly - 3 reps - 60s (progress to longer) hold  Patient Education - Total Knee Replacement Handout   ASSESSMENT:  CLINICAL IMPRESSION: Patient demonstrates excellent motivation during session today. Additional seated and standing exercises performed with patient during session today with emphasis on equal WB through BLE as well as improved R quad contraction/knee extension during exercises. Practiced heel/toe gait pattern in rehab gym with verbal cues as well as demonstration. Verbal cues provided to improve RLE step length and increase R terminal knee extension prior to initial contact. Total ambulation distance approximately 100' with improving gait kinematics after verbal and tactile cues. Continued with  gentle stretching as well as manual techniques to improve R knee range of motion. R knee AAROM is improved from last session and is -3 - 115 degrees at the end of the visit today. Pt encouraged to continue HEP. She will benefit from skilled PT to address above impairments and improve overall function.  REHAB POTENTIAL: Excellent  CLINICAL DECISION MAKING: Evolving/moderate complexity  EVALUATION COMPLEXITY: Low   GOALS: Goals reviewed with patient? Yes  SHORT TERM GOALS: Target date: 02/23/2022   Pt will be independent with HEP to improve strength and decrease knee pain to improve pain-free function at home. Baseline:  Goal status: INITIAL   LONG TERM GOALS: Target date: 04/06/2022   Pt will increase FOTO to at least 57 to demonstrate significant improvement in function at home and work related to knee pain  Baseline: 01/12/22: 37 Goal status: INITIAL  2.  Pt will decrease worst knee pain by at least 3 points on the NPRS in order to demonstrate clinically significant reduction in back pain. Baseline: 01/12/22: worst 8/10 Goal status: INITIAL  3.  Pt will increase strength of R quad (perform 10 full SLR without quad lag) in order to demonstrate improvement in strength and  function related to R TKR  Baseline: 01/12/22: Able to perform SLR but with notable quad lag Goal status: INITIAL  4.  Pt will increase R knee AAROM to 0 degrees extension and at least 120 degrees of flexion in order to improve function with gait and stairs. Baseline: 01/12/22: -6 to 97 Goal status: INITIAL  5.  Pt will be able to perform sit to stand from regular height chair without UE assist in order to demonstrate improved function related to increase in BLE strength. Baseline: 01/12/22: Unable to perform sit to stand without heavy UE assist Goal status: INITIAL   PLAN: PT FREQUENCY: 2x/week  PT DURATION: 12 weeks  PLANNED INTERVENTIONS: Therapeutic exercises, Therapeutic activity, Neuromuscular re-education, Balance training, Gait training, Patient/Family education, Joint manipulation, Joint mobilization, Canalith repositioning, Aquatic Therapy, Dry Needling, Electrical stimulation, Spinal manipulation, Spinal mobilization, Cryotherapy, Moist heat, Taping, Traction, Ultrasound, Ionotophoresis '4mg'$ /ml Dexamethasone, and Manual therapy  PLAN FOR NEXT SESSION:  review/modify HEP as needed, continue strengthening and stretching.  Lyndel Safe Marilee Ditommaso PT, DPT, GCS  Omkar Stratmann 01/18/2022, 11:36 AM

## 2022-01-22 NOTE — Therapy (Signed)
OUTPATIENT PHYSICAL THERAPY KNEE TREATMENT   Patient Name: Charlotte Glover MRN: [id number] DOB:[birth year], 88 y.o., female Today's Date: 01/23/2022   PT End of Session - 01/23/22 1129     Visit Number 4    Number of Visits 25    Date for PT Re-Evaluation 04/06/22    Authorization Type eval: 01/12/22    PT Start Time 1015    PT Stop Time 1100    PT Time Calculation (min) 45 min    Activity Tolerance Patient tolerated treatment well    Behavior During Therapy [facility] for tasks assessed/performed                Past Medical History:  Diagnosis Date   (HFpEF) heart failure with preserved ejection fraction ([place]) 06/21/2020   a.) TTE 06/21/2020: EF >55%, RAE, G1DD   Amaurosis fugax of left eye    Arthritis    Atrial fibrillation ([place])    a.) Dx'd in 2009; b.) CHA2DS2-VASc = 6 (age x 2, sex, CHF, HTN, prior MI); c.) s/p PVI 09/31/2013; DCCV (200 J x 1) 04/02/2012, repeat PVI with roof line, coronary sinus isolation, and CFAE ablation 01/02/2013; DCCV 01/18/2013 (200 J x 1); AV nodal ablation 05/28/2014;  d.) rate/rhythm maintained without pharmacological intervention; chronically anticoagulated using rivaroxaban   Cardiac arrest ([place]) 10/2009   a.) witnessed arrest at home; husband performed CPR; ROSC achieved in the field. Admitted for workup --> etiology of CV event undetermined; felt to be secondary to "electrolyte abnormalities".   Cardiac murmur    CHB (complete heart block) ([place])    a.) s/p PPM 05/28/2014; b.) CRT-P upgrade 07/13/2016.   Chickenpox    CKD (chronic kidney disease), stage III (HCC)    Coronary artery disease    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/13/2013   a.) grade II; ER/PR (+)   Dyspnea    Edema    GERD (gastroesophageal reflux disease)    Granulomatous disease ([place])    a.) spleen   Hiatal hernia    HLD (hyperlipidemia)    HOH (hard of hearing)    Hypertension 01/23/2012   [place] esophagus    Long term current use of  antithrombotics/antiplatelets    a.) rivaroxaban   Melanoma of lower leg, left (HCC)    Meningioma, multiple ([place]) 07/14/2019   a.) MRI brain 07/14/2019: 1.2 x 1.3 x 1.3 cm midline posterior fossa meningioma inferior to the cerebellar vertex; 7 x 3 mm dural based meningioma along frontal convexity   NSTEMI (non-ST elevated myocardial infarction) ([place]) 08/2009   Presence of permanent cardiac pacemaker    Pulmonary HTN ([place]) 05/08/2016   a.) R/LHC 05/08/2016: EF >55, LVEDP 15, mean PA 38, AO sat 97, RVSP 50, CO 3 L/min   S/P ablation of atrial fibrillation    Shingles    SSS (sick sinus syndrome) ([place])    a.) CRT-P in place   Valvular heart disease 02/08/2012   a.) cMRI 02/08/2012: EF 50-55, mild-mod AR, mod MR; b.) TEE 02/26/2012: EF >55, mild MR/AR/TR; c.) R/LHC 05/08/2016: sev MR; d.) TEE 06/14/2016: EF >55, sev MR, mod AR/TR; e.) TTE 10/05/2016: EF >55, triv PR, mod AR/MR/TR; f.) TTE 04/22/2018: EF >55, mild MR/TR/PR, mod AR; g.) TTE 11/27/2019: EF 50, mild PR, mod AR/MR/TR; h.) TTE 06/21/2020: EF >55, triv PR, mod AR/MR/TR   Wears hearing aid in both ears    Past Surgical History:  Procedure Laterality Date   ANTERIOR VITRECTOMY Left 03/20/2017   Procedure:  ANTERIOR VITRECTOMY;  Surgeon: Leandrew Koyanagi, MD;  Location: [place];  Service: Ophthalmology;  Laterality: Left;  IVA TOPICAL LEFT   ARTERY BIOPSY Right 04/08/2019   Procedure: BIOPSY TEMPORAL ARTERY;  Surgeon: Algernon Huxley, MD;  Location: [facility];  Service: Vascular;  Laterality: Right;   ATRIAL FIBRILLATION ABLATION N/A 03/24/2012   Procedure: ATRIAL FIBRILLATION ABLATION (PVI); Location: [facility]   ATRIAL FIBRILLATION ABLATION N/A 01/02/2013   Procedure: ATRIAL FIBRILLATION ABLATION (PVI, roof line, coronary sinus siolation, CFAE ablation); Location: [facility]   AV NODE ABLATION N/A 05/26/2014   Procedure: AV NODE ABLATION; Location: [facility]   BIV PACEMAKER INSERTION CRT-P N/A 07/13/2016   Procedure: CRT-P BiV  PACEMAKER UPGRADE; Location: [facility]   BREAST BIOPSY Right 2015   + for DCIS    BREAST SURGERY     CARDIAC CATHETERIZATION Bilateral 05/08/2016   Procedure: Right/Left Heart Cath and Coronary Angiography;  Surgeon: Isaias Cowman, MD;  Location: [place] [facility];  Service: Cardiovascular;  Laterality: Bilateral;   CARDIOVERSION N/A 04/02/2012   Procedure: CARDIOVERSION; Location: [facility]   CARDIOVERSION N/A 01/15/2013   Procedure: CARDIOVERSION; Location: [facility]   CATARACT EXTRACTION W/PHACO Left 03/20/2017   Procedure: IOL EXCHANGE;  Surgeon: Leandrew Koyanagi, MD;  Location: [place];  Service: Ophthalmology;  Laterality: Left;   CHOLECYSTECTOMY     COLONOSCOPY     patient reports several   DILATION AND CURETTAGE OF UTERUS     ESOPHAGOGASTRODUODENOSCOPY     ESOPHAGOGASTRODUODENOSCOPY (EGD) WITH PROPOFOL N/A 12/13/2020   Procedure: ESOPHAGOGASTRODUODENOSCOPY (EGD) WITH PROPOFOL;  Surgeon: Lesly Rubenstein, MD;  Location: [facility];  Service: Endoscopy;  Laterality: N/A;   KNEE ARTHROPLASTY Right 12/25/2021   Procedure: COMPUTER ASSISTED TOTAL KNEE ARTHROPLASTY;  Surgeon: Dereck Leep, MD;  Location: [facility];  Service: Orthopedics;  Laterality: Right;   PACEMAKER INSERTION N/A 05/28/2014   Procedure: PACEMAKER INSERTION (dual chamber); Location: [facility]   PERIPHERAL IRIDOTOMY Left 03/20/2017   Procedure: PERIPHERAL IRIDECTOMY;  Surgeon: Leandrew Koyanagi, MD;  Location: [place];  Service: Ophthalmology;  Laterality: Left;   ROTATOR CUFF REPAIR Left    TEE WITHOUT CARDIOVERSION N/A 05/16/2016   Procedure: TRANSESOPHAGEAL ECHOCARDIOGRAM (TEE);  Surgeon: Teodoro Spray, MD;  Location: [facility];  Service: Cardiovascular;  Laterality: N/A;   TONSILLECTOMY     uterine polyp     Patient Active Problem List   Diagnosis Date Noted   Total knee replacement status 12/25/2021   Prediabetes 12/14/2021   Acquired thrombophilia ([place]) 08/07/2021   Primary  osteoarthritis of right knee 04/09/2021   Swelling of limb 11/01/2020   Enlarged pulmonary artery ([place]) 05/23/2020   Moderate aortic insufficiency 03/07/2020   Intracranial carotid stenosis, right 05/29/2019   Chronic headaches 05/29/2019   Long term current use of systemic steroids 05/04/2019   Screening for osteoporosis 05/04/2019   Temporal arteritis ([place]) 04/07/2019   Meningioma ([place]) 12/29/2018   Chronic daily headache 11/20/2018   Elevated erythrocyte sedimentation rate 11/05/2018   CKD (chronic kidney disease) stage 3, GFR 30-59 ml/min ([place]) 12/27/2017   Shingles 05/06/2017   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 04/12/2017   Primary osteoarthritis of right shoulder 12/07/2016   Rotator cuff tear, right 12/07/2016   Rotator cuff tendinitis, right 12/07/2016   CHB (complete heart block) ([place]) 06/30/2016   Severe mitral regurgitation 05/29/2016   Pedal edema 04/23/2016   Lung nodule, multiple 12/28/2015   Amnesia, global, transient 04/05/2015   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 06/30/2014   Biventricular cardiac pacemaker in situ 05/18/2014   Sleep disorder 05/06/2014   Diffuse pain 04/06/2014   Fatigue 03/16/2014   Headache 03/16/2014   History of melanoma 11/24/2013   Melanoma ([place]) 11/24/2013   DCIS (ductal carcinoma in situ) 08/11/2013   History of ductal carcinoma in situ (DCIS) of breast 08/11/2013   H/O non-ST elevation myocardial infarction (NSTEMI) 01/01/2013   Hearing impairment 01/01/2013   S/P ablation of atrial fibrillation 07/11/2012   Edema 01/23/2012   Hypertension 01/23/2012   Atrial fibrillation ([place]) 11/27/2011   Coronary artery disease 11/27/2011    PCP: Adin Hector, MD  REFERRING PROVIDER: Dereck Leep, MD  REFERRING DIAGNOSIS: [phone] (ICD-10-CM) - Presence of right artificial knee joint  THERAPY DIAG: Chronic pain of right knee  Muscle weakness (generalized)  Difficulty  in walking, not elsewhere classified  RATIONALE FOR EVALUATION AND TREATMENT: Rehabilitation  ONSET DATE: 12/25/21  FOLLOW UP APPT WITH PROVIDER: Yes    FROM INITIAL EVALUATION  SUBJECTIVE:                                                                                                                                                                                         Chief Complaint: R Knee replacement 12/25/21  Pertinent History Pt underwent R TKR 12/25/21 due to ongoing chronic pain in her knee. No reported post-operative complications with the exception of some post-operative bleeding. She has been regularly icing, elevating, and wearing compression stockings on the RLE. No compression hose upon arrival today. She had HH PT after the surgery and reports that it went well. She has continued performing her HEP including mini squats and standing hip abduction. Two falls since the surgery but denies any trauma to right knee. Husband and family have been assisting with IADLs since the surgery.  Pain:  Pain Intensity: Present: 0/10, Best: 0/10, Worst: 8/10 Pain location: R Knee Pain quality: aching  Radiating pain: No  Swelling: Yes  Numbness/Tingling: No 24-hour pain behavior: Varies throughout the day History of prior back, hip, or knee injury, pain, surgery, or therapy: No, chronic R knee pain but no prior history of R knee or hip surgery Falls: Has patient fallen in last 6 months? 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  Recent memory is intact.  Remote memory is intact.  Attention span and concentration are intact.  Expressive speech is intact.  Patient's fund of knowledge is within normal limits for educational level.     Gross Musculoskeletal Assessment Tremor: None Bulk: Mild swelling around R knee. Bandage is covering incision however pt indicates that the bandage is wet from her shower yesterday and she doesn't know if she should change the dressing. She does not have any additional dressings at home to use. Tone: Normal   GAIT: Distance walked: 200' with front wheeled walker. Antalgic with decreased stance time on RLE. Decreased self-selected gait speed but functional for limited community mobility Assistive device utilized: Walker - 2 wheeled Level of assistance: Modified independence   Posture: Forward head and rounded shoulders, mild resting R knee flexion  AROM  AROM (Normal range in degrees) AROM  01/23/2022  Lumbar   Flexion (65)   Extension (30)   Right lateral flexion (25)   Left lateral flexion (25)   Right rotation (30)   Left rotation (30)       Hip Right Left  Flexion (125)    Extension (15)    Abduction (40)    Adduction     Internal Rotation (45)    External Rotation (45)        Knee    Flexion (135) 97 135  Extension (0) -6 +2 (hyperextension)      Ankle    Dorsiflexion (20)    Plantarflexion (50)    Inversion (35)    Eversion (15    (* = pain; Blank rows = not tested)   LE MMT:  MMT (out of 5) Right 01/23/2022 Left 01/23/2022  Hip flexion 3+ 3+  Hip extension    Hip abduction    Hip adduction    Hip internal rotation    Hip external rotation     Knee flexion 5 5  Knee extension 5 5  Ankle dorsiflexion 4 4  Ankle plantarflexion Active Active  Ankle inversion    Ankle eversion    (* = pain; Blank rows = not tested)   Sensation Deferred   Reflexes Deferred  Muscle Length Hamstrings: R: Positive for decreased length at 70 degrees L: Not examined  Palpation Mild pain to palpation at medial and lateral knee. Hematoma palpated in R anteromedial thigh just proximal to the knee joint.   Passive Accessory Motion Deferred  VASCULAR Deferred   SPECIAL TESTS Deferred   TODAY'S TREATMENT (01/16/22)  SUBJECTIVE: Pt reports that she is doing well today. She denies any resting R knee pain upon arrival. HEP is going well without issue. No specific questions or concerns currently.  PAIN: Denies      Ther-ex  NuStep L1-2 x 5 minutes for warm-up during interval history (2 minutes unbilled); Practiced heel/toe pattern in around gym with verbal cues to improve RLE step length and increase R terminal knee extension prior to initial contact x 30.'   Supine R quad set with heel on towel roll to stretch into extension with 3s hold x 10; Supine R SLR hip flexion initiating with quad set x 10; Hooklying clams x  10 BLE, with manual resistance from therapist Hooklying adductor squeeze x 10 BLE, with manual resistance from therapist; Hooklying bridges with arms at side x 10; Seated R LAQ with light manual resistance by therapist x 10; Seated R hamstring curls with manual resistance x 10; Sit to stand from elevated mat table with BUE hand support by therapist 2 x 10, second set performed at lower height, cues to avoid BLE valgus; Standing R TKE with green tband resistance x 10; Standing heel raises in walker with BUE support and cues to contract R quad near terminal extension x 10;   Manual Therapy  Supine R knee extension stretch with heel on towel roll and therapist providing gentle STM to R posterior knee/hamstrings, pt with  poor tolerance for extension stretch and requires repeated breaks; Supine R patella superior and inferior grade I-II mobilizations, 3 x 30s each direction, therapist taking care to approximate edges of scar and not provide excessive tension; Supine R tibia on femur PA mobilizations at available end range extension to improve extension, grade I-II, 30s/bout x 3 bouts; Supine R knee flexion stretch with pt performing active heel slide and therapist providing gentle overpressure at end range flexion 3 x 30s; Supine R tibia on femur AP mobilizations at available end range flexion to improve flexion, grade I-II, 30s/bout x 3 bouts; Cold pack applied to R knee at end of session with heel elevated on towel roll to stretch into extension x 5 minutes (unbilled);  R knee AAROM at end of session: -5 - 116 degrees   Not performed today: Standing R straight leg hip abduction x 10;   PATIENT EDUCATION:  Education details: Pt educated throughout session about proper posture and technique with exercises. Improved exercise technique, movement at target joints, use of target muscles after min to mod verbal, visual, tactile cues.  Person educated: Patient Education method: Explanation Education comprehension: verbalized understanding   HOME EXERCISE PROGRAM: Access Code: [email or web address] URL: [email or web address] Date: 01/14/2022 Prepared by: Roxana Hires  Exercises - Supine Ankle Pumps  - 2 x daily - 7 x weekly - 2 sets - 10 reps - Supine Quadricep Sets (Mirrored)  - 2 x daily - 7 x weekly - 2 sets - 10 reps - 3s hold - Supine Active Straight Leg Raise  - 2 x daily - 7 x weekly - 2 sets - 10 reps - Supine Heel Slides  - 2 x daily - 7 x weekly - 2 sets - 10 reps - Seated Long Arc Quad  - 2 x daily - 7 x weekly - 2 sets - 10 reps - Mini Squat with Counter Support  - 2 x daily - 7 x weekly - 2 sets - 10 reps - Standing Hip Abduction with Counter Support (Mirrored)  - 2 x daily - 7 x weekly - 2  sets - 10 reps - 3s (perform on both sides) hold - Seated Knee Flexion Stretch  - 2 x daily - 7 x weekly - 3 reps - 60s (progress to longer holds) hold - Seated Passive Knee Extension (Mirrored)  - 2 x daily - 7 x weekly - 3 reps - 60s (progress to longer) hold  Patient Education - Total Knee Replacement Handout   ASSESSMENT:  CLINICAL IMPRESSION: Patient demonstrates excellent motivation during session today. Continued with supine, seated and standing exercises during session today. She demonstrates improved R quad contraction with less extensor lag during supine SLR.  Practiced heel/toe gait pattern in rehab gym again today  and still requires verbal cues to improve R terminal knee extension prior to initial contact. Continued with gentle stretching as well as manual techniques to improve R knee range of motion and introduced low grade tibia on femur mobilizations. R knee AAROM is relatively unchanged from last session and is -5 - 116 degrees at the end of the visit today. Pt encouraged to continue HEP. She will benefit from skilled PT to address above impairments and improve overall function.  REHAB POTENTIAL: Excellent  CLINICAL DECISION MAKING: Evolving/moderate complexity  EVALUATION COMPLEXITY: Low   GOALS: Goals reviewed with patient? Yes  SHORT TERM GOALS: Target date: 02/23/2022   Pt will be independent with HEP to improve strength and decrease knee pain to improve pain-free function at home. Baseline:  Goal status: INITIAL   LONG TERM GOALS: Target date: 04/06/2022   Pt will increase FOTO to at least 57 to demonstrate significant improvement in function at home and work related to knee pain  Baseline: 01/12/22: 37 Goal status: INITIAL  2.  Pt will decrease worst knee pain by at least 3 points on the NPRS in order to demonstrate clinically significant reduction in back pain. Baseline: 01/12/22: worst 8/10 Goal status: INITIAL  3.  Pt will increase strength of R quad  (perform 10 full SLR without quad lag) in order to demonstrate improvement in strength and function related to R TKR  Baseline: 01/12/22: Able to perform SLR but with notable quad lag Goal status: INITIAL  4.  Pt will increase R knee AAROM to 0 degrees extension and at least 120 degrees of flexion in order to improve function with gait and stairs. Baseline: 01/12/22: -6 to 97 Goal status: INITIAL  5.  Pt will be able to perform sit to stand from regular height chair without UE assist in order to demonstrate improved function related to increase in BLE strength. Baseline: 01/12/22: Unable to perform sit to stand without heavy UE assist Goal status: INITIAL   PLAN: PT FREQUENCY: 2x/week  PT DURATION: 12 weeks  PLANNED INTERVENTIONS: Therapeutic exercises, Therapeutic activity, Neuromuscular re-education, Balance training, Gait training, Patient/Family education, Joint manipulation, Joint mobilization, Canalith repositioning, Aquatic Therapy, Dry Needling, Electrical stimulation, Spinal manipulation, Spinal mobilization, Cryotherapy, Moist heat, Taping, Traction, Ultrasound, Ionotophoresis '4mg'$ /ml Dexamethasone, and Manual therapy  PLAN FOR NEXT SESSION:  review/modify HEP as needed, continue strengthening and stretching.  Lyndel Safe Keaton Beichner PT, DPT, GCS  Maxime Beckner 01/23/2022, 11:39 AM

## 2022-01-23 ENCOUNTER — Ambulatory Visit: Payer: Medicare Other | Attending: Orthopedic Surgery

## 2022-01-23 DIAGNOSIS — G8929 Other chronic pain: Secondary | ICD-10-CM | POA: Diagnosis present

## 2022-01-23 DIAGNOSIS — R262 Difficulty in walking, not elsewhere classified: Secondary | ICD-10-CM | POA: Diagnosis present

## 2022-01-23 DIAGNOSIS — M25561 Pain in right knee: Secondary | ICD-10-CM | POA: Diagnosis present

## 2022-01-23 DIAGNOSIS — M6281 Muscle weakness (generalized): Secondary | ICD-10-CM | POA: Diagnosis present

## 2022-01-25 ENCOUNTER — Ambulatory Visit: Payer: Medicare Other

## 2022-01-25 DIAGNOSIS — M6281 Muscle weakness (generalized): Secondary | ICD-10-CM

## 2022-01-25 DIAGNOSIS — M25561 Pain in right knee: Secondary | ICD-10-CM | POA: Diagnosis not present

## 2022-01-25 DIAGNOSIS — R262 Difficulty in walking, not elsewhere classified: Secondary | ICD-10-CM

## 2022-01-25 DIAGNOSIS — G8929 Other chronic pain: Secondary | ICD-10-CM

## 2022-01-25 NOTE — Therapy (Signed)
OUTPATIENT PHYSICAL THERAPY KNEE TREATMENT   Patient Name: Charlotte Glover MRN: [id number] DOB:[birth year], 88 y.o., female Today's Date: 01/25/2022   PT End of Session - 01/25/22 1010     Visit Number 5    Number of Visits 25    Date for PT Re-Evaluation 04/06/22    Authorization Type eval: 01/12/22    PT Start Time 1015    PT Stop Time 1100    PT Time Calculation (min) 45 min    Activity Tolerance Patient tolerated treatment well    Behavior During Therapy [facility] for tasks assessed/performed             Past Medical History:  Diagnosis Date   (HFpEF) heart failure with preserved ejection fraction ([place]) 06/21/2020   a.) TTE 06/21/2020: EF >55%, RAE, G1DD   Amaurosis fugax of left eye    Arthritis    Atrial fibrillation ([place])    a.) Dx'd in 2009; b.) CHA2DS2-VASc = 6 (age x 2, sex, CHF, HTN, prior MI); c.) s/p PVI 09/31/2013; DCCV (200 J x 1) 04/02/2012, repeat PVI with roof line, coronary sinus isolation, and CFAE ablation 01/02/2013; DCCV 01/18/2013 (200 J x 1); AV nodal ablation 05/28/2014;  d.) rate/rhythm maintained without pharmacological intervention; chronically anticoagulated using rivaroxaban   Cardiac arrest ([place]) 10/2009   a.) witnessed arrest at home; husband performed CPR; ROSC achieved in the field. Admitted for workup --> etiology of CV event undetermined; felt to be secondary to "electrolyte abnormalities".   Cardiac murmur    CHB (complete heart block) ([place])    a.) s/p PPM 05/28/2014; b.) CRT-P upgrade 07/13/2016.   Chickenpox    CKD (chronic kidney disease), stage III (HCC)    Coronary artery disease    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/13/2013   a.) grade II; ER/PR (+)   Dyspnea    Edema    GERD (gastroesophageal reflux disease)    Granulomatous disease ([place])    a.) spleen   Hiatal hernia    HLD (hyperlipidemia)    HOH (hard of hearing)    Hypertension 01/23/2012   [place] esophagus    Long term current use of  antithrombotics/antiplatelets    a.) rivaroxaban   Melanoma of lower leg, left (HCC)    Meningioma, multiple ([place]) 07/14/2019   a.) MRI brain 07/14/2019: 1.2 x 1.3 x 1.3 cm midline posterior fossa meningioma inferior to the cerebellar vertex; 7 x 3 mm dural based meningioma along frontal convexity   NSTEMI (non-ST elevated myocardial infarction) ([place]) 08/2009   Presence of permanent cardiac pacemaker    Pulmonary HTN ([place]) 05/08/2016   a.) R/LHC 05/08/2016: EF >55, LVEDP 15, mean PA 38, AO sat 97, RVSP 50, CO 3 L/min   S/P ablation of atrial fibrillation    Shingles    SSS (sick sinus syndrome) ([place])    a.) CRT-P in place   Valvular heart disease 02/08/2012   a.) cMRI 02/08/2012: EF 50-55, mild-mod AR, mod MR; b.) TEE 02/26/2012: EF >55, mild MR/AR/TR; c.) R/LHC 05/08/2016: sev MR; d.) TEE 06/14/2016: EF >55, sev MR, mod AR/TR; e.) TTE 10/05/2016: EF >55, triv PR, mod AR/MR/TR; f.) TTE 04/22/2018: EF >55, mild MR/TR/PR, mod AR; g.) TTE 11/27/2019: EF 50, mild PR, mod AR/MR/TR; h.) TTE 06/21/2020: EF >55, triv PR, mod AR/MR/TR   Wears hearing aid in both ears    Past Surgical History:  Procedure Laterality Date   ANTERIOR VITRECTOMY Left 03/20/2017   Procedure: ANTERIOR VITRECTOMY;  Surgeon: Leandrew Koyanagi, MD;  Location: [facility];  Service: Ophthalmology;  Laterality: Left;  IVA TOPICAL LEFT   ARTERY BIOPSY Right 04/08/2019   Procedure: BIOPSY TEMPORAL ARTERY;  Surgeon: Algernon Huxley, MD;  Location: [facility];  Service: Vascular;  Laterality: Right;   ATRIAL FIBRILLATION ABLATION N/A 03/24/2012   Procedure: ATRIAL FIBRILLATION ABLATION (PVI); Location: [facility]   ATRIAL FIBRILLATION ABLATION N/A 01/02/2013   Procedure: ATRIAL FIBRILLATION ABLATION (PVI, roof line, coronary sinus siolation, CFAE ablation); Location: [facility]   AV NODE ABLATION N/A 05/26/2014   Procedure: AV NODE ABLATION; Location: [facility]   BIV PACEMAKER INSERTION CRT-P N/A 07/13/2016   Procedure: CRT-P BiV  PACEMAKER UPGRADE; Location: [facility]   BREAST BIOPSY Right 2015   + for DCIS    BREAST SURGERY     CARDIAC CATHETERIZATION Bilateral 05/08/2016   Procedure: Right/Left Heart Cath and Coronary Angiography;  Surgeon: Isaias Cowman, MD;  Location: [facility];  Service: Cardiovascular;  Laterality: Bilateral;   CARDIOVERSION N/A 04/02/2012   Procedure: CARDIOVERSION; Location: [facility]   CARDIOVERSION N/A 01/15/2013   Procedure: CARDIOVERSION; Location: [facility]   CATARACT EXTRACTION W/PHACO Left 03/20/2017   Procedure: IOL EXCHANGE;  Surgeon: Leandrew Koyanagi, MD;  Location: [facility];  Service: Ophthalmology;  Laterality: Left;   CHOLECYSTECTOMY     COLONOSCOPY     patient reports several   DILATION AND CURETTAGE OF UTERUS     ESOPHAGOGASTRODUODENOSCOPY     ESOPHAGOGASTRODUODENOSCOPY (EGD) WITH PROPOFOL N/A 12/13/2020   Procedure: ESOPHAGOGASTRODUODENOSCOPY (EGD) WITH PROPOFOL;  Surgeon: Lesly Rubenstein, MD;  Location: [facility];  Service: Endoscopy;  Laterality: N/A;   KNEE ARTHROPLASTY Right 12/25/2021   Procedure: COMPUTER ASSISTED TOTAL KNEE ARTHROPLASTY;  Surgeon: Dereck Leep, MD;  Location: [facility];  Service: Orthopedics;  Laterality: Right;   PACEMAKER INSERTION N/A 05/28/2014   Procedure: PACEMAKER INSERTION (dual chamber); Location: [facility]   PERIPHERAL IRIDOTOMY Left 03/20/2017   Procedure: PERIPHERAL IRIDECTOMY;  Surgeon: Leandrew Koyanagi, MD;  Location: [facility];  Service: Ophthalmology;  Laterality: Left;   ROTATOR CUFF REPAIR Left    TEE WITHOUT CARDIOVERSION N/A 05/16/2016   Procedure: TRANSESOPHAGEAL ECHOCARDIOGRAM (TEE);  Surgeon: Teodoro Spray, MD;  Location: [facility];  Service: Cardiovascular;  Laterality: N/A;   TONSILLECTOMY     uterine polyp     Patient Active Problem List   Diagnosis Date Noted   Total knee replacement status 12/25/2021   Prediabetes 12/14/2021   Acquired thrombophilia ([place]) 08/07/2021   Primary  osteoarthritis of right knee 04/09/2021   Swelling of limb 11/01/2020   Enlarged pulmonary artery ([place]) 05/23/2020   Moderate aortic insufficiency 03/07/2020   Intracranial carotid stenosis, right 05/29/2019   Chronic headaches 05/29/2019   Long term current use of systemic steroids 05/04/2019   Screening for osteoporosis 05/04/2019   Temporal arteritis ([place]) 04/07/2019   Meningioma ([place]) 12/29/2018   Chronic daily headache 11/20/2018   Elevated erythrocyte sedimentation rate 11/05/2018   CKD (chronic kidney disease) stage 3, GFR 30-59 ml/min ([place]) 12/27/2017   Shingles 05/06/2017   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 04/12/2017   Primary osteoarthritis of right shoulder 12/07/2016   Rotator cuff tear, right 12/07/2016   Rotator cuff tendinitis, right 12/07/2016   CHB (complete heart block) ([place]) 06/30/2016   Severe mitral regurgitation 05/29/2016   Pedal edema 04/23/2016   Lung nodule, multiple 12/28/2015   Amnesia, global, transient 04/05/2015   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 06/30/2014  Biventricular cardiac pacemaker in situ 05/18/2014   Sleep disorder 05/06/2014   Diffuse pain 04/06/2014   Fatigue 03/16/2014   Headache 03/16/2014   History of melanoma 11/24/2013   Melanoma ([place]) 11/24/2013   DCIS (ductal carcinoma in situ) 08/11/2013   History of ductal carcinoma in situ (DCIS) of breast 08/11/2013   H/O non-ST elevation myocardial infarction (NSTEMI) 01/01/2013   Hearing impairment 01/01/2013   S/P ablation of atrial fibrillation 07/11/2012   Edema 01/23/2012   Hypertension 01/23/2012   Atrial fibrillation ([place]) 11/27/2011   Coronary artery disease 11/27/2011    PCP: Adin Hector, MD  REFERRING PROVIDER: Dereck Leep, MD  REFERRING DIAGNOSIS: [phone] (ICD-10-CM) - Presence of right artificial knee joint  THERAPY DIAG: Chronic pain of right knee  Muscle weakness (generalized)  Difficulty  in walking, not elsewhere classified  RATIONALE FOR EVALUATION AND TREATMENT: Rehabilitation  ONSET DATE: 12/25/21  FOLLOW UP APPT WITH PROVIDER: Yes    FROM INITIAL EVALUATION  SUBJECTIVE:                                                                                                                                                                                         Chief Complaint: R Knee replacement 12/25/21  Pertinent History Pt underwent R TKR 12/25/21 due to ongoing chronic pain in her knee. No reported post-operative complications with the exception of some post-operative bleeding. She has been regularly icing, elevating, and wearing compression stockings on the RLE. No compression hose upon arrival today. She had HH PT after the surgery and reports that it went well. She has continued performing her HEP including mini squats and standing hip abduction. Two falls since the surgery but denies any trauma to right knee. Husband and family have been assisting with IADLs since the surgery.  Pain:  Pain Intensity: Present: 0/10, Best: 0/10, Worst: 8/10 Pain location: R Knee Pain quality: aching  Radiating pain: No  Swelling: Yes  Numbness/Tingling: No 24-hour pain behavior: Varies throughout the day History of prior back, hip, or knee injury, pain, surgery, or therapy: No, chronic R knee pain but no prior history of R knee or hip surgery Falls: Has patient fallen in last 6 months? 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  Recent memory is intact.  Remote memory is intact.  Attention span and concentration are intact.  Expressive speech is intact.  Patient's fund of knowledge is within normal limits for educational level.     Gross Musculoskeletal Assessment Tremor: None Bulk: Mild swelling around R knee. Bandage is covering incision however pt indicates that the bandage is wet from her shower yesterday and she doesn't know if she should change the dressing. She does not have any additional dressings at home to use. Tone: Normal   GAIT: Distance walked: 200' with front wheeled walker. Antalgic with decreased stance time on RLE. Decreased self-selected gait speed but functional for limited community mobility Assistive device utilized: Walker - 2 wheeled Level of assistance: Modified independence   Posture: Forward head and rounded shoulders, mild resting R knee flexion  AROM  AROM (Normal range in degrees) AROM  01/25/2022  Lumbar   Flexion (65)   Extension (30)   Right lateral flexion (25)   Left lateral flexion (25)   Right rotation (30)   Left rotation (30)       Hip Right Left  Flexion (125)    Extension (15)    Abduction (40)    Adduction     Internal Rotation (45)    External Rotation (45)        Knee    Flexion (135) 97 135  Extension (0) -6 +2 (hyperextension)      Ankle    Dorsiflexion (20)    Plantarflexion (50)    Inversion (35)    Eversion (15    (* = pain; Blank rows = not tested)   LE MMT:  MMT (out of 5) Right 01/25/2022 Left 01/25/2022  Hip flexion 3+ 3+  Hip extension    Hip abduction    Hip adduction    Hip internal rotation    Hip external rotation     Knee flexion 5 5  Knee extension 5 5  Ankle dorsiflexion 4 4  Ankle plantarflexion Active Active  Ankle inversion    Ankle eversion    (* = pain; Blank rows = not tested)   Sensation Deferred   Reflexes Deferred  Muscle Length Hamstrings: R: Positive for decreased length at 70 degrees L: Not examined  Palpation Mild pain to palpation at medial and lateral knee. Hematoma palpated in R anteromedial thigh just proximal to the knee joint.   Passive Accessory Motion Deferred  VASCULAR Deferred   SPECIAL TESTS Deferred   TODAY'S TREATMENT (01/25/22)  SUBJECTIVE: Pt reports that she is doing well today. She denies any resting R knee pain upon arrival and denies any excessive soreness after the last therapy session. HEP is going well. No specific questions or concerns currently.   PAIN: Denies   Ther-ex  NuStep L0 BLE only x 5 minutes for warm-up during interval history (2 minutes unbilled); Seated clams 2 x 10 BLE, with manual resistance from therapist Stead adductor squeeze 2 x 10 BLE, with manual resistance from therapist; Seated R LAQ with light manual resistance by therapist 2 x 10; Seated R hamstring curls with manual resistance 2 x 10; Standing mini squats 2 x 10; Standing R TKE with green tband  resistance 2 x 10; Standing heel raises in walker with BUE support and cues to contract R quad near terminal extension 2 x 10; Seated R hamstring step stretch 2 x 60s;  Standing exercises with 2# ankle weights (AW): Hip flexion marches 2 x 10; Hip abduction 2 x 10; Hamstring curls 2 x 10; Hip extension 2 x 10;  Cold pack applied to R knee at end of session with pt in sitting and heel elevated on step with knee in full extension x 5 minutes (unbilled);   Not performed today: Standing R straight leg hip abduction x 10; Sit to stand from elevated mat table with BUE hand support by therapist 2 x 10, second set performed at lower height, cues to avoid BLE  valgus; Practiced heel/toe pattern in around gym with verbal cues to improve RLE step length and increase R terminal knee extension prior to initial contact x 30.'   Supine R quad set with heel on towel roll to stretch into extension with 3s hold x 10; Supine R SLR hip flexion initiating with quad set x 10; Hooklying bridges with arms at side x 10; Supine R knee extension stretch with heel on towel roll and therapist providing gentle STM to R posterior knee/hamstrings, pt with poor tolerance for extension stretch and requires repeated breaks; Supine R patella superior and inferior grade I-II mobilizations, 3 x 30s each direction, therapist taking care to approximate edges of scar and not provide excessive tension; Supine R tibia on femur PA mobilizations at available end range extension to improve extension, grade I-II, 30s/bout x 3 bouts; Supine R knee flexion stretch with pt performing active heel slide and therapist providing gentle overpressure at end range flexion 3 x 30s; Supine R tibia on femur AP mobilizations at available end range flexion to improve flexion, grade I-II, 30s/bout x 3 bouts;   PATIENT EDUCATION:  Education details: Pt educated throughout session about proper posture and technique with exercises. Improved exercise technique, movement at target joints, use of target muscles after min to mod verbal, visual, tactile cues.  Person educated: Patient Education method: Explanation Education comprehension: verbalized understanding   HOME EXERCISE PROGRAM: Access Code: [email or web address] URL: [email or web address] Date: 01/14/2022 Prepared by: Roxana Hires  Exercises - Supine Ankle Pumps  - 2 x daily - 7 x weekly - 2 sets - 10 reps - Supine Quadricep Sets (Mirrored)  - 2 x daily - 7 x weekly - 2 sets - 10 reps - 3s hold - Supine Active Straight Leg Raise  - 2 x daily - 7 x weekly - 2 sets - 10 reps - Supine Heel Slides  - 2 x daily - 7 x weekly - 2 sets - 10 reps -  Seated Long Arc Quad  - 2 x daily - 7 x weekly - 2 sets - 10 reps - Mini Squat with Counter Support  - 2 x daily - 7 x weekly - 2 sets - 10 reps - Standing Hip Abduction with Counter Support (Mirrored)  - 2 x daily - 7 x weekly - 2 sets - 10 reps - 3s (perform on both sides) hold - Seated Knee Flexion Stretch  - 2 x daily - 7 x weekly - 3 reps - 60s (progress to longer holds) hold - Seated Passive Knee Extension (Mirrored)  - 2 x daily - 7 x weekly - 3 reps - 60s (progress to longer) hold  Patient Education - Total Knee Replacement Handout   ASSESSMENT:  CLINICAL IMPRESSION:  Patient demonstrates excellent motivation during session today. Continued with seated and standing exercises during session today. She demonstrates improved R quad contraction during mini squats and terminal knee extensions in standing. Continued with self-directed gentle hamstring stretching in sitting. Pt encouraged to continue HEP. She will benefit from skilled PT to address above impairments and improve overall function.  REHAB POTENTIAL: Excellent  CLINICAL DECISION MAKING: Evolving/moderate complexity  EVALUATION COMPLEXITY: Low   GOALS: Goals reviewed with patient? Yes  SHORT TERM GOALS: Target date: 02/23/2022   Pt will be independent with HEP to improve strength and decrease knee pain to improve pain-free function at home. Baseline:  Goal status: INITIAL   LONG TERM GOALS: Target date: 04/06/2022   Pt will increase FOTO to at least 57 to demonstrate significant improvement in function at home and work related to knee pain  Baseline: 01/12/22: 37 Goal status: INITIAL  2.  Pt will decrease worst knee pain by at least 3 points on the NPRS in order to demonstrate clinically significant reduction in back pain. Baseline: 01/12/22: worst 8/10 Goal status: INITIAL  3.  Pt will increase strength of R quad (perform 10 full SLR without quad lag) in order to demonstrate improvement in strength and function  related to R TKR  Baseline: 01/12/22: Able to perform SLR but with notable quad lag Goal status: INITIAL  4.  Pt will increase R knee AAROM to 0 degrees extension and at least 120 degrees of flexion in order to improve function with gait and stairs. Baseline: 01/12/22: -6 to 97 Goal status: INITIAL  5.  Pt will be able to perform sit to stand from regular height chair without UE assist in order to demonstrate improved function related to increase in BLE strength. Baseline: 01/12/22: Unable to perform sit to stand without heavy UE assist Goal status: INITIAL   PLAN: PT FREQUENCY: 2x/week  PT DURATION: 12 weeks  PLANNED INTERVENTIONS: Therapeutic exercises, Therapeutic activity, Neuromuscular re-education, Balance training, Gait training, Patient/Family education, Joint manipulation, Joint mobilization, Canalith repositioning, Aquatic Therapy, Dry Needling, Electrical stimulation, Spinal manipulation, Spinal mobilization, Cryotherapy, Moist heat, Taping, Traction, Ultrasound, Ionotophoresis '4mg'$ /ml Dexamethasone, and Manual therapy  PLAN FOR NEXT SESSION:  review/modify HEP as needed, continue strengthening and stretching.  Lyndel Safe Adriene Padula PT, DPT, GCS  Elena Cothern 01/25/2022, 9:54 PM

## 2022-01-29 NOTE — Therapy (Signed)
OUTPATIENT PHYSICAL THERAPY KNEE TREATMENT   Patient Name: Charlotte Glover MRN: [id number] DOB:26-Jan-1928, 88 y.o., female Today's Date: 01/30/2022   PT End of Session - 01/30/22 1016     Visit Number 6    Number of Visits 25    Date for PT Re-Evaluation 04/06/22    Authorization Type eval: 01/12/22    PT Start Time 1013    PT Stop Time 1058    PT Time Calculation (min) 45 min    Activity Tolerance Patient tolerated treatment well    Behavior During Therapy [facility] for tasks assessed/performed              Past Medical History:  Diagnosis Date   (HFpEF) heart failure with preserved ejection fraction ([place]) 06/21/2020   a.) TTE 06/21/2020: EF >55%, RAE, G1DD   Amaurosis fugax of left eye    Arthritis    Atrial fibrillation ([place])    a.) Dx'd in 2009; b.) CHA2DS2-VASc = 6 (age x 2, sex, CHF, HTN, prior MI); c.) s/p PVI 09/31/2013; DCCV (200 J x 1) 04/02/2012, repeat PVI with roof line, coronary sinus isolation, and CFAE ablation 01/02/2013; DCCV 01/18/2013 (200 J x 1); AV nodal ablation 05/28/2014;  d.) rate/rhythm maintained without pharmacological intervention; chronically anticoagulated using rivaroxaban   Cardiac arrest ([place]) 10/2009   a.) witnessed arrest at home; husband performed CPR; ROSC achieved in the field. Admitted for workup --> etiology of CV event undetermined; felt to be secondary to "electrolyte abnormalities".   Cardiac murmur    CHB (complete heart block) ([place])    a.) s/p PPM 05/28/2014; b.) CRT-P upgrade 07/13/2016.   Chickenpox    CKD (chronic kidney disease), stage III (HCC)    Coronary artery disease    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/13/2013   a.) grade II; ER/PR (+)   Dyspnea    Edema    GERD (gastroesophageal reflux disease)    Granulomatous disease ([place])    a.) spleen   Hiatal hernia    HLD (hyperlipidemia)    HOH (hard of hearing)    Hypertension 01/23/2012   [place] esophagus    Long term current use of  antithrombotics/antiplatelets    a.) rivaroxaban   Melanoma of lower leg, left (HCC)    Meningioma, multiple ([place]) 07/14/2019   a.) MRI brain 07/14/2019: 1.2 x 1.3 x 1.3 cm midline posterior fossa meningioma inferior to the cerebellar vertex; 7 x 3 mm dural based meningioma along frontal convexity   NSTEMI (non-ST elevated myocardial infarction) ([place]) 08/2009   Presence of permanent cardiac pacemaker    Pulmonary HTN ([place]) 05/08/2016   a.) R/LHC 05/08/2016: EF >55, LVEDP 15, mean PA 38, AO sat 97, RVSP 50, CO 3 L/min   S/P ablation of atrial fibrillation    Shingles    SSS (sick sinus syndrome) ([place])    a.) CRT-P in place   Valvular heart disease 02/08/2012   a.) cMRI 02/08/2012: EF 50-55, mild-mod AR, mod MR; b.) TEE 02/26/2012: EF >55, mild MR/AR/TR; c.) R/LHC 05/08/2016: sev MR; d.) TEE 06/14/2016: EF >55, sev MR, mod AR/TR; e.) TTE 10/05/2016: EF >55, triv PR, mod AR/MR/TR; f.) TTE 04/22/2018: EF >55, mild MR/TR/PR, mod AR; g.) TTE 11/27/2019: EF 50, mild PR, mod AR/MR/TR; h.) TTE 06/21/2020: EF >55, triv PR, mod AR/MR/TR   Wears hearing aid in both ears    Past Surgical History:  Procedure Laterality Date   ANTERIOR VITRECTOMY Left 03/20/2017   Procedure: ANTERIOR VITRECTOMY;  Surgeon: Leandrew Koyanagi, MD;  Location: [facility];  Service: Ophthalmology;  Laterality: Left;  IVA TOPICAL LEFT   ARTERY BIOPSY Right 04/08/2019   Procedure: BIOPSY TEMPORAL ARTERY;  Surgeon: Algernon Huxley, MD;  Location: [facility];  Service: Vascular;  Laterality: Right;   ATRIAL FIBRILLATION ABLATION N/A 03/24/2012   Procedure: ATRIAL FIBRILLATION ABLATION (PVI); Location: [facility]   ATRIAL FIBRILLATION ABLATION N/A 01/02/2013   Procedure: ATRIAL FIBRILLATION ABLATION (PVI, roof line, coronary sinus siolation, CFAE ablation); Location: [facility]   AV NODE ABLATION N/A 05/26/2014   Procedure: AV NODE ABLATION; Location: [facility]   BIV PACEMAKER INSERTION CRT-P N/A 07/13/2016   Procedure: CRT-P BiV  PACEMAKER UPGRADE; Location: [facility]   BREAST BIOPSY Right 2015   + for DCIS    BREAST SURGERY     CARDIAC CATHETERIZATION Bilateral 05/08/2016   Procedure: Right/Left Heart Cath and Coronary Angiography;  Surgeon: Isaias Cowman, MD;  Location: [facility];  Service: Cardiovascular;  Laterality: Bilateral;   CARDIOVERSION N/A 04/02/2012   Procedure: CARDIOVERSION; Location: [facility]   CARDIOVERSION N/A 01/15/2013   Procedure: CARDIOVERSION; Location: [facility]   CATARACT EXTRACTION W/PHACO Left 03/20/2017   Procedure: IOL EXCHANGE;  Surgeon: Leandrew Koyanagi, MD;  Location: [facility];  Service: Ophthalmology;  Laterality: Left;   CHOLECYSTECTOMY     COLONOSCOPY     patient reports several   DILATION AND CURETTAGE OF UTERUS     ESOPHAGOGASTRODUODENOSCOPY     ESOPHAGOGASTRODUODENOSCOPY (EGD) WITH PROPOFOL N/A 12/13/2020   Procedure: ESOPHAGOGASTRODUODENOSCOPY (EGD) WITH PROPOFOL;  Surgeon: Lesly Rubenstein, MD;  Location: [facility];  Service: Endoscopy;  Laterality: N/A;   KNEE ARTHROPLASTY Right 12/25/2021   Procedure: COMPUTER ASSISTED TOTAL KNEE ARTHROPLASTY;  Surgeon: Dereck Leep, MD;  Location: [facility];  Service: Orthopedics;  Laterality: Right;   PACEMAKER INSERTION N/A 05/28/2014   Procedure: PACEMAKER INSERTION (dual chamber); Location: [facility]   PERIPHERAL IRIDOTOMY Left 03/20/2017   Procedure: PERIPHERAL IRIDECTOMY;  Surgeon: Leandrew Koyanagi, MD;  Location: [facility];  Service: Ophthalmology;  Laterality: Left;   ROTATOR CUFF REPAIR Left    TEE WITHOUT CARDIOVERSION N/A 05/16/2016   Procedure: TRANSESOPHAGEAL ECHOCARDIOGRAM (TEE);  Surgeon: Teodoro Spray, MD;  Location: [facility];  Service: Cardiovascular;  Laterality: N/A;   TONSILLECTOMY     uterine polyp     Patient Active Problem List   Diagnosis Date Noted   Total knee replacement status 12/25/2021   Prediabetes 12/14/2021   Acquired thrombophilia ([place]) 08/07/2021   Primary  osteoarthritis of right knee 04/09/2021   Swelling of limb 11/01/2020   Enlarged pulmonary artery ([place]) 05/23/2020   Moderate aortic insufficiency 03/07/2020   Intracranial carotid stenosis, right 05/29/2019   Chronic headaches 05/29/2019   Long term current use of systemic steroids 05/04/2019   Screening for osteoporosis 05/04/2019   Temporal arteritis ([place]) 04/07/2019   Meningioma ([place]) 12/29/2018   Chronic daily headache 11/20/2018   Elevated erythrocyte sedimentation rate 11/05/2018   CKD (chronic kidney disease) stage 3, GFR 30-59 ml/min ([place]) 12/27/2017   Shingles 05/06/2017   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 04/12/2017   Primary osteoarthritis of right shoulder 12/07/2016   Rotator cuff tear, right 12/07/2016   Rotator cuff tendinitis, right 12/07/2016   CHB (complete heart block) ([place]) 06/30/2016   Severe mitral regurgitation 05/29/2016   Pedal edema 04/23/2016   Lung nodule, multiple 12/28/2015   Amnesia, global, transient 04/05/2015   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 06/30/2014  Biventricular cardiac pacemaker in situ 05/18/2014   Sleep disorder 05/06/2014   Diffuse pain 04/06/2014   Fatigue 03/16/2014   Headache 03/16/2014   History of melanoma 11/24/2013   Melanoma ([place]) 11/24/2013   DCIS (ductal carcinoma in situ) 08/11/2013   History of ductal carcinoma in situ (DCIS) of breast 08/11/2013   H/O non-ST elevation myocardial infarction (NSTEMI) 01/01/2013   Hearing impairment 01/01/2013   S/P ablation of atrial fibrillation 07/11/2012   Edema 01/23/2012   Hypertension 01/23/2012   Atrial fibrillation ([place]) 11/27/2011   Coronary artery disease 11/27/2011    PCP: Adin Hector, MD  REFERRING PROVIDER: Dereck Leep, MD  REFERRING DIAGNOSIS: [phone] (ICD-10-CM) - Presence of right artificial knee joint  THERAPY DIAG: Chronic pain of right knee  Muscle weakness (generalized)  Difficulty  in walking, not elsewhere classified  RATIONALE FOR EVALUATION AND TREATMENT: Rehabilitation  ONSET DATE: 12/25/21  FOLLOW UP APPT WITH PROVIDER: Yes    FROM INITIAL EVALUATION  SUBJECTIVE:                                                                                                                                                                                         Chief Complaint: R Knee replacement 12/25/21  Pertinent History Pt underwent R TKR 12/25/21 due to ongoing chronic pain in her knee. No reported post-operative complications with the exception of some post-operative bleeding. She has been regularly icing, elevating, and wearing compression stockings on the RLE. No compression hose upon arrival today. She had HH PT after the surgery and reports that it went well. She has continued performing her HEP including mini squats and standing hip abduction. Two falls since the surgery but denies any trauma to right knee. Husband and family have been assisting with IADLs since the surgery.  Pain:  Pain Intensity: Present: 0/10, Best: 0/10, Worst: 8/10 Pain location: R Knee Pain quality: aching  Radiating pain: No  Swelling: Yes  Numbness/Tingling: No 24-hour pain behavior: Varies throughout the day History of prior back, hip, or knee injury, pain, surgery, or therapy: No, chronic R knee pain but no prior history of R knee or hip surgery Falls: Has patient fallen in last 6 months? 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  Recent memory is intact.  Remote memory is intact.  Attention span and concentration are intact.  Expressive speech is intact.  Patient's fund of knowledge is within normal limits for educational level.     Gross Musculoskeletal Assessment Tremor: None Bulk: Mild swelling around R knee. Bandage is covering incision however pt indicates that the bandage is wet from her shower yesterday and she doesn't know if she should change the dressing. She does not have any additional dressings at home to use. Tone: Normal   GAIT: Distance walked: 200' with front wheeled walker. Antalgic with decreased stance time on RLE. Decreased self-selected gait speed but functional for limited community mobility Assistive device utilized: Walker - 2 wheeled Level of assistance: Modified independence   Posture: Forward head and rounded shoulders, mild resting R knee flexion  AROM  AROM (Normal range in degrees) AROM  01/30/2022  Lumbar   Flexion (65)   Extension (30)   Right lateral flexion (25)   Left lateral flexion (25)   Right rotation (30)   Left rotation (30)       Hip Right Left  Flexion (125)    Extension (15)    Abduction (40)    Adduction     Internal Rotation (45)    External Rotation (45)        Knee    Flexion (135) 97 135  Extension (0) -6 +2 (hyperextension)      Ankle    Dorsiflexion (20)    Plantarflexion (50)    Inversion (35)    Eversion (15    (* = pain; Blank rows = not tested)   LE MMT:  MMT (out of 5) Right 01/30/2022 Left 01/30/2022  Hip flexion 3+ 3+  Hip extension    Hip abduction    Hip adduction    Hip internal rotation    Hip external rotation     Knee flexion 5 5  Knee extension 5 5  Ankle dorsiflexion 4 4  Ankle plantarflexion Active Active  Ankle inversion    Ankle eversion    (* = pain; Blank rows = not tested)   Sensation Deferred   Reflexes Deferred  Muscle Length Hamstrings: R: Positive for decreased length at 70 degrees L: Not examined  Palpation Mild pain to palpation at medial and lateral knee. Hematoma palpated in R anteromedial thigh just proximal to the knee joint.   Passive Accessory Motion Deferred  VASCULAR Deferred   SPECIAL TESTS Deferred   TODAY'S TREATMENT (01/25/22)  SUBJECTIVE: Pt reports that she is doing well today. She reports some mild medial R knee soreness upon arrival but does not rate on the NPRS. Denies excessive soreness after the last therapy session. HEP is going well however she is not performing her knee extension stretches regularly. She arrives ambulating with a single point cane. No specific questions currently.   PAIN: Denies pain but reports mild medial R knee soreness, not rated on NPRS   Ther-ex  NuStep L1-2 BLE only x 5 minutes for warm-up during interval history (3 minutes unbilled); Seated R LAQ with light manual resistance by therapist x 10; Seated R hamstring curls with manual resistance x 10; Standing mini squats  x 10; Standing R TKE with green tband resistance x 10; Standing heel raises in walker with BUE support and cues to contract R quad near terminal extension x 10; Supine R quad set with heel on towel roll to stretch into extension with 3s hold x 10; Supine R SLR hip flexion initiating with quad set x 10;   Neuromuscular Re-education  Performed BERG with patient who scored 33/56;   Manual Therapy  Supine R knee extension stretch with heel on towel roll and therapist providing gentle STM to R posterior knee/hamstrings, pt with poor tolerance for extension stretch and requires repeated breaks; Supine R patella superior mobilizations grade II-III, 3 x  30s; Scar massage to superior 2/3 of scar in multiple directions, avoided last 1.5" of scar due to continued healing with eschar; R knee AAROM with overpressure at end of session: -5 - 120 degrees  Cold pack applied to R knee at end of session with pt in supine and heel elevated on towel roll with knee in full extension x 5 minutes (unbilled);   Not performed today: Hooklying bridges with arms at side x 10; Supine R tibia on femur PA mobilizations at available end range extension to improve extension, grade I-II, 30s/bout x 3 bouts; Supine R knee flexion stretch with pt performing active heel slide and therapist providing gentle overpressure at end range flexion 3 x 30s; Supine R tibia on femur AP mobilizations at available end range flexion to improve flexion, grade I-II, 30s/bout x 3 bouts; Seated clams 2 x 10 BLE, with manual resistance from therapist Seated adductor squeeze 2 x 10 BLE, with manual resistance from therapist; Seated R hamstring step stretch 2 x 60s; Sit to stand from elevated mat table with BUE hand support by therapist 2 x 10, second set performed at lower height, cues to avoid BLE valgus; Standing R straight leg hip abduction x 10; Standing exercises with 2# ankle weights (AW): Hip flexion marches 2 x 10; Hip abduction 2 x 10; Hamstring curls 2 x 10; Hip extension 2 x 10;   PATIENT EDUCATION:  Education details: Pt educated throughout session about proper posture and technique with exercises. Improved exercise technique, movement at target joints, use of target muscles after min to mod verbal, visual, tactile cues; Continue using walker and compression stockings, focus on passive R knee extension stretches;  Person educated: Patient and husband Education method: Explanation Education comprehension: verbalized understanding   HOME EXERCISE PROGRAM: Access Code: [email or web address] URL: [email or web address] Date: 01/14/2022 Prepared by: Roxana Hires  Exercises - Supine Ankle Pumps  - 2 x daily - 7 x weekly - 2 sets - 10 reps - Supine Quadricep Sets (Mirrored)  - 2 x daily - 7 x weekly - 2 sets - 10 reps - 3s hold - Supine Active Straight Leg Raise  - 2 x daily - 7 x weekly - 2 sets - 10 reps - Supine Heel Slides  - 2 x daily - 7 x weekly - 2 sets - 10 reps - Seated Long Arc Quad  - 2 x daily - 7 x weekly - 2 sets - 10 reps - Mini Squat with Counter Support  - 2 x daily - 7 x weekly - 2 sets - 10 reps - Standing Hip Abduction with Counter Support (Mirrored)  - 2 x daily - 7 x weekly - 2 sets - 10 reps - 3s (perform on both sides) hold - Seated Knee Flexion Stretch  - 2 x daily - 7  x weekly - 3 reps - 60s (progress to longer holds) hold - Seated Passive Knee Extension (Mirrored)  - 2 x daily - 7 x weekly - 3 reps - 60s (progress to longer) hold  Patient Education - Total Knee Replacement Handout   ASSESSMENT:  CLINICAL IMPRESSION: Patient demonstrates excellent motivation during session today. She arrives ambulating with a single point cane which therapist has not recommended her to use at this time. BERG balance test performed and pt scored 33/56 which confirms she is a very high fall risk. Therapist advised her to continue using the front wheeled walker at home and when out in the community. She continues to be inconsistent using compression stockings and is not regularly performing heel elevated R knee extension stretches. Therapist advised pt of the importance of both, with particular focus on knee extension stretches. Her current R knee AAROM with overpressure is -5 to 120 degrees. However she maintains her knee flexed regularly when walking. Continued with strengthening during session today. She demonstrates improved R quad contraction with less extensor lag during SLR. Pt encouraged to continue HEP and follow-up as scheduled. She will benefit from skilled PT to address above impairments and improve overall function.  REHAB  POTENTIAL: Excellent  CLINICAL DECISION MAKING: Evolving/moderate complexity  EVALUATION COMPLEXITY: Low   GOALS: Goals reviewed with patient? Yes  SHORT TERM GOALS: Target date: 02/23/2022   Pt will be independent with HEP to improve strength and decrease knee pain to improve pain-free function at home. Baseline:  Goal status: INITIAL   LONG TERM GOALS: Target date: 04/06/2022   Pt will increase FOTO to at least 57 to demonstrate significant improvement in function at home and work related to knee pain  Baseline: 01/12/22: 37 Goal status: INITIAL  2.  Pt will decrease worst knee pain by at least 3 points on the NPRS in order to demonstrate clinically significant reduction in back pain. Baseline: 01/12/22: worst 8/10 Goal status: INITIAL  3.  Pt will increase strength of R quad (perform 10 full SLR without quad lag) in order to demonstrate improvement in strength and function related to R TKR  Baseline: 01/12/22: Able to perform SLR but with notable quad lag Goal status: INITIAL  4.  Pt will increase R knee AAROM to 0 degrees extension and at least 120 degrees of flexion in order to improve function with gait and stairs. Baseline: 01/12/22: -6 to 97 Goal status: INITIAL  5.  Pt will be able to perform sit to stand from regular height chair without UE assist in order to demonstrate improved function related to increase in BLE strength. Baseline: 01/12/22: Unable to perform sit to stand without heavy UE assist Goal status: INITIAL   PLAN: PT FREQUENCY: 2x/week  PT DURATION: 12 weeks  PLANNED INTERVENTIONS: Therapeutic exercises, Therapeutic activity, Neuromuscular re-education, Balance training, Gait training, Patient/Family education, Joint manipulation, Joint mobilization, Canalith repositioning, Aquatic Therapy, Dry Needling, Electrical stimulation, Spinal manipulation, Spinal mobilization, Cryotherapy, Moist heat, Taping, Traction, Ultrasound, Ionotophoresis '4mg'$ /ml  Dexamethasone, and Manual therapy  PLAN FOR NEXT SESSION:  review/modify HEP as needed, continue strengthening and stretching.  Lyndel Safe Rylin Saez PT, DPT, GCS  Charlotte Glover 01/30/2022, 1:17 PM

## 2022-01-30 ENCOUNTER — Ambulatory Visit: Payer: Medicare Other

## 2022-01-30 DIAGNOSIS — M6281 Muscle weakness (generalized): Secondary | ICD-10-CM

## 2022-01-30 DIAGNOSIS — R262 Difficulty in walking, not elsewhere classified: Secondary | ICD-10-CM

## 2022-01-30 DIAGNOSIS — G8929 Other chronic pain: Secondary | ICD-10-CM

## 2022-01-30 DIAGNOSIS — M25561 Pain in right knee: Secondary | ICD-10-CM | POA: Diagnosis not present

## 2022-01-31 NOTE — Therapy (Signed)
OUTPATIENT PHYSICAL THERAPY KNEE TREATMENT   Patient Name: Charlotte Glover MRN: [id number] DOB:Oct 24, 1927, 88 y.o., female Today's Date: 01/31/2022      Past Medical History:  Diagnosis Date   (HFpEF) heart failure with preserved ejection fraction ([place]) 06/21/2020   a.) TTE 06/21/2020: EF >55%, RAE, G1DD   Amaurosis fugax of left eye    Arthritis    Atrial fibrillation ([place])    a.) Dx'd in 2009; b.) CHA2DS2-VASc = 6 (age x 2, sex, CHF, HTN, prior MI); c.) s/p PVI 09/31/2013; DCCV (200 J x 1) 04/02/2012, repeat PVI with roof line, coronary sinus isolation, and CFAE ablation 01/02/2013; DCCV 01/18/2013 (200 J x 1); AV nodal ablation 05/28/2014;  d.) rate/rhythm maintained without pharmacological intervention; chronically anticoagulated using rivaroxaban   Cardiac arrest ([place]) 10/2009   a.) witnessed arrest at home; husband performed CPR; ROSC achieved in the field. Admitted for workup --> etiology of CV event undetermined; felt to be secondary to "electrolyte abnormalities".   Cardiac murmur    CHB (complete heart block) ([place])    a.) s/p PPM 05/28/2014; b.) CRT-P upgrade 07/13/2016.   Chickenpox    CKD (chronic kidney disease), stage III (HCC)    Coronary artery disease    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/13/2013   a.) grade II; ER/PR (+)   Dyspnea    Edema    GERD (gastroesophageal reflux disease)    Granulomatous disease ([place])    a.) spleen   Hiatal hernia    HLD (hyperlipidemia)    HOH (hard of hearing)    Hypertension 01/23/2012   [place] esophagus    Long term current use of antithrombotics/antiplatelets    a.) rivaroxaban   Melanoma of lower leg, left (HCC)    Meningioma, multiple ([place]) 07/14/2019   a.) MRI brain 07/14/2019: 1.2 x 1.3 x 1.3 cm midline posterior fossa meningioma inferior to the cerebellar vertex; 7 x 3 mm dural based meningioma along frontal convexity   NSTEMI (non-ST elevated myocardial infarction) ([place]) 08/2009   Presence  of permanent cardiac pacemaker    Pulmonary HTN ([place]) 05/08/2016   a.) R/LHC 05/08/2016: EF >55, LVEDP 15, mean PA 38, AO sat 97, RVSP 50, CO 3 L/min   S/P ablation of atrial fibrillation    Shingles    SSS (sick sinus syndrome) ([place])    a.) CRT-P in place   Valvular heart disease 02/08/2012   a.) cMRI 02/08/2012: EF 50-55, mild-mod AR, mod MR; b.) TEE 02/26/2012: EF >55, mild MR/AR/TR; c.) R/LHC 05/08/2016: sev MR; d.) TEE 06/14/2016: EF >55, sev MR, mod AR/TR; e.) TTE 10/05/2016: EF >55, triv PR, mod AR/MR/TR; f.) TTE 04/22/2018: EF >55, mild MR/TR/PR, mod AR; g.) TTE 11/27/2019: EF 50, mild PR, mod AR/MR/TR; h.) TTE 06/21/2020: EF >55, triv PR, mod AR/MR/TR   Wears hearing aid in both ears    Past Surgical History:  Procedure Laterality Date   ANTERIOR VITRECTOMY Left 03/20/2017   Procedure: ANTERIOR VITRECTOMY;  Surgeon: Leandrew Koyanagi, MD;  Location: [facility];  Service: Ophthalmology;  Laterality: Left;  IVA TOPICAL LEFT   ARTERY BIOPSY Right 04/08/2019   Procedure: BIOPSY TEMPORAL ARTERY;  Surgeon: Algernon Huxley, MD;  Location: [facility];  Service: Vascular;  Laterality: Right;   ATRIAL FIBRILLATION ABLATION N/A 03/24/2012   Procedure: ATRIAL FIBRILLATION ABLATION (PVI); Location: [facility]   ATRIAL FIBRILLATION ABLATION N/A 01/02/2013   Procedure: ATRIAL FIBRILLATION ABLATION (PVI, roof line, coronary sinus siolation, CFAE ablation); Location: [facility]  AV NODE ABLATION N/A 05/26/2014   Procedure: AV NODE ABLATION; Location: [facility]   BIV PACEMAKER INSERTION CRT-P N/A 07/13/2016   Procedure: CRT-P BiV PACEMAKER UPGRADE; Location: [facility]   BREAST BIOPSY Right 2015   + for DCIS    BREAST SURGERY     CARDIAC CATHETERIZATION Bilateral 05/08/2016   Procedure: Right/Left Heart Cath and Coronary Angiography;  Surgeon: Isaias Cowman, MD;  Location: [facility];  Service: Cardiovascular;  Laterality: Bilateral;   CARDIOVERSION N/A 04/02/2012   Procedure: CARDIOVERSION;  Location: [facility]   CARDIOVERSION N/A 01/15/2013   Procedure: CARDIOVERSION; Location: [facility]   CATARACT EXTRACTION W/PHACO Left 03/20/2017   Procedure: IOL EXCHANGE;  Surgeon: Leandrew Koyanagi, MD;  Location: [facility];  Service: Ophthalmology;  Laterality: Left;   CHOLECYSTECTOMY     COLONOSCOPY     patient reports several   DILATION AND CURETTAGE OF UTERUS     ESOPHAGOGASTRODUODENOSCOPY     ESOPHAGOGASTRODUODENOSCOPY (EGD) WITH PROPOFOL N/A 12/13/2020   Procedure: ESOPHAGOGASTRODUODENOSCOPY (EGD) WITH PROPOFOL;  Surgeon: Lesly Rubenstein, MD;  Location: [facility];  Service: Endoscopy;  Laterality: N/A;   KNEE ARTHROPLASTY Right 12/25/2021   Procedure: COMPUTER ASSISTED TOTAL KNEE ARTHROPLASTY;  Surgeon: Dereck Leep, MD;  Location: [facility];  Service: Orthopedics;  Laterality: Right;   PACEMAKER INSERTION N/A 05/28/2014   Procedure: PACEMAKER INSERTION (dual chamber); Location: [facility]   PERIPHERAL IRIDOTOMY Left 03/20/2017   Procedure: PERIPHERAL IRIDECTOMY;  Surgeon: Leandrew Koyanagi, MD;  Location: [facility];  Service: Ophthalmology;  Laterality: Left;   ROTATOR CUFF REPAIR Left    TEE WITHOUT CARDIOVERSION N/A 05/16/2016   Procedure: TRANSESOPHAGEAL ECHOCARDIOGRAM (TEE);  Surgeon: Teodoro Spray, MD;  Location: [facility];  Service: Cardiovascular;  Laterality: N/A;   TONSILLECTOMY     uterine polyp     Patient Active Problem List   Diagnosis Date Noted   Total knee replacement status 12/25/2021   Prediabetes 12/14/2021   Acquired thrombophilia ([place]) 08/07/2021   Primary osteoarthritis of right knee 04/09/2021   Swelling of limb 11/01/2020   Enlarged pulmonary artery ([place]) 05/23/2020   Moderate aortic insufficiency 03/07/2020   Intracranial carotid stenosis, right 05/29/2019   Chronic headaches 05/29/2019   Long term current use of systemic steroids 05/04/2019   Screening for osteoporosis 05/04/2019   Temporal arteritis ([place]) 04/07/2019    Meningioma ([place]) 12/29/2018   Chronic daily headache 11/20/2018   Elevated erythrocyte sedimentation rate 11/05/2018   CKD (chronic kidney disease) stage 3, GFR 30-59 ml/min (HCC) 12/27/2017   Shingles 05/06/2017   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 04/12/2017   Primary osteoarthritis of right shoulder 12/07/2016   Rotator cuff tear, right 12/07/2016   Rotator cuff tendinitis, right 12/07/2016   CHB (complete heart block) ([place]) 06/30/2016   Severe mitral regurgitation 05/29/2016   Pedal edema 04/23/2016   Lung nodule, multiple 12/28/2015   Amnesia, global, transient 04/05/2015   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 06/30/2014   Biventricular cardiac pacemaker in situ 05/18/2014   Sleep disorder 05/06/2014   Diffuse pain 04/06/2014   Fatigue 03/16/2014   Headache 03/16/2014   History of melanoma 11/24/2013   Melanoma ([place]) 11/24/2013   DCIS (ductal carcinoma in situ) 08/11/2013   History of ductal carcinoma in situ (DCIS) of breast 08/11/2013   H/O non-ST elevation myocardial infarction (NSTEMI) 01/01/2013   Hearing impairment 01/01/2013   S/P ablation of atrial fibrillation 07/11/2012   Edema 01/23/2012   Hypertension 01/23/2012  Atrial fibrillation ([place]) 11/27/2011   Coronary artery disease 11/27/2011    PCP: Adin Hector, MD  REFERRING PROVIDER: Dereck Leep, MD  REFERRING DIAGNOSIS: [id number] (ICD-10-CM) - Presence of right artificial knee joint  THERAPY DIAG: Chronic pain of right knee  Muscle weakness (generalized)  Difficulty in walking, not elsewhere classified  RATIONALE FOR EVALUATION AND TREATMENT: Rehabilitation  ONSET DATE: 12/25/21  FOLLOW UP APPT WITH PROVIDER: Yes    FROM INITIAL EVALUATION  SUBJECTIVE:                                                                                                                                                                                          Chief Complaint: R Knee replacement 12/25/21  Pertinent History Pt underwent R TKR 12/25/21 due to ongoing chronic pain in her knee. No reported post-operative complications with the exception of some post-operative bleeding. She has been regularly icing, elevating, and wearing compression stockings on the RLE. No compression hose upon arrival today. She had HH PT after the surgery and reports that it went well. She has continued performing her HEP including mini squats and standing hip abduction. Two falls since the surgery but denies any trauma to right knee. Husband and family have been assisting with IADLs since the surgery.  Pain:  Pain Intensity: Present: 0/10, Best: 0/10, Worst: 8/10 Pain location: R Knee Pain quality: aching  Radiating pain: No  Swelling: Yes  Numbness/Tingling: No 24-hour pain behavior: Varies throughout the day History of prior back, hip, or knee injury, pain, surgery, or therapy: No, chronic R knee pain but no prior history of R knee or hip surgery Falls: Has patient fallen in last 6 months? 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  Recent memory is intact.  Remote memory is intact.  Attention span and  concentration are intact.  Expressive speech is intact.  Patient's fund of knowledge is within normal limits for educational level.  Gross Musculoskeletal Assessment Tremor: None Bulk: Mild swelling around R knee. Bandage is covering incision however pt indicates that the bandage is wet from her shower yesterday and she doesn't know if she should change the dressing. She does not have any additional dressings at home to use. Tone: Normal   GAIT: Distance walked: 200' with front wheeled walker. Antalgic with decreased stance time on RLE. Decreased self-selected gait speed but functional for limited community mobility Assistive device utilized: Walker - 2 wheeled Level of assistance: Modified independence   Posture: Forward head and rounded shoulders, mild resting R knee flexion  AROM  AROM (Normal range in degrees) AROM  01/31/2022  Lumbar   Flexion (65)   Extension (30)   Right lateral flexion (25)   Left lateral flexion (25)   Right rotation (30)   Left rotation (30)       Hip Right Left  Flexion (125)    Extension (15)    Abduction (40)    Adduction     Internal Rotation (45)    External Rotation (45)        Knee    Flexion (135) 97 135  Extension (0) -6 +2 (hyperextension)      Ankle    Dorsiflexion (20)    Plantarflexion (50)    Inversion (35)    Eversion (15    (* = pain; Blank rows = not tested)   LE MMT:  MMT (out of 5) Right 01/31/2022 Left 01/31/2022  Hip flexion 3+ 3+  Hip extension    Hip abduction    Hip adduction    Hip internal rotation    Hip external rotation    Knee flexion 5 5  Knee extension 5 5  Ankle dorsiflexion 4 4  Ankle plantarflexion Active Active  Ankle inversion    Ankle eversion    (* = pain; Blank rows = not tested)   Sensation Deferred   Reflexes Deferred  Muscle Length Hamstrings: R: Positive for decreased length at 70 degrees L: Not examined  Palpation Mild pain to palpation at medial and lateral knee.  Hematoma palpated in R anteromedial thigh just proximal to the knee joint.   Passive Accessory Motion Deferred  VASCULAR Deferred   SPECIAL TESTS Deferred   TODAY'S TREATMENT (01/25/22)  SUBJECTIVE: Pt reports that she is doing well today. She reports some mild medial R knee soreness upon arrival but does not rate on the NPRS. Denies excessive soreness after the last therapy session. HEP is going well however she is not performing her knee extension stretches regularly. She arrives ambulating with a single point cane. No specific questions currently.   PAIN: Denies pain but reports mild medial R knee soreness, not rated on NPRS   Ther-ex  NuStep L1-2 BLE only x 5 minutes for warm-up during interval history (3 minutes unbilled); Seated R LAQ with light manual resistance by therapist x 10; Seated R hamstring curls with manual resistance x 10; Standing mini squats x 10; Standing R TKE with green tband resistance x 10; Standing heel raises in walker with BUE support and cues to contract R quad near terminal extension x 10; Supine R quad set with heel on towel roll to stretch into extension with 3s hold x 10; Supine R SLR hip flexion initiating with quad set x 10;   Neuromuscular Re-education  Performed BERG with patient who scored 33/56;   Manual Therapy  Supine R knee extension stretch with heel on towel roll and therapist providing  gentle STM to R posterior knee/hamstrings, pt with poor tolerance for extension stretch and requires repeated breaks; Supine R patella superior mobilizations grade II-III, 3 x 30s; Scar massage to superior 2/3 of scar in multiple directions, avoided last 1.5" of scar due to continued healing with eschar; R knee AAROM with overpressure at end of session: -5 - 120 degrees  Cold pack applied to R knee at end of session with pt in supine and heel elevated on towel roll with knee in full extension x 5 minutes (unbilled);   Not performed  today: Hooklying bridges with arms at side x 10; Supine R tibia on femur PA mobilizations at available end range extension to improve extension, grade I-II, 30s/bout x 3 bouts; Supine R knee flexion stretch with pt performing active heel slide and therapist providing gentle overpressure at end range flexion 3 x 30s; Supine R tibia on femur AP mobilizations at available end range flexion to improve flexion, grade I-II, 30s/bout x 3 bouts; Seated clams 2 x 10 BLE, with manual resistance from therapist Seated adductor squeeze 2 x 10 BLE, with manual resistance from therapist; Seated R hamstring step stretch 2 x 60s; Sit to stand from elevated mat table with BUE hand support by therapist 2 x 10, second set performed at lower height, cues to avoid BLE valgus; Standing R straight leg hip abduction x 10; Standing exercises with 2# ankle weights (AW): Hip flexion marches 2 x 10; Hip abduction 2 x 10; Hamstring curls 2 x 10; Hip extension 2 x 10;   PATIENT EDUCATION:  Education details: Pt educated throughout session about proper posture and technique with exercises. Improved exercise technique, movement at target joints, use of target muscles after min to mod verbal, visual, tactile cues; Continue using walker and compression stockings, focus on passive R knee extension stretches;  Person educated: Patient and husband Education method: Explanation Education comprehension: verbalized understanding   HOME EXERCISE PROGRAM: Access Code: [email or web address] URL: [email or web address] Date: 01/14/2022 Prepared by: Roxana Hires  Exercises - Supine Ankle Pumps  - 2 x daily - 7 x weekly - 2 sets - 10 reps - Supine Quadricep Sets (Mirrored)  - 2 x daily - 7 x weekly - 2 sets - 10 reps - 3s hold - Supine Active Straight Leg Raise  - 2 x daily - 7 x weekly - 2 sets - 10 reps - Supine Heel Slides  - 2 x daily - 7 x weekly - 2 sets - 10 reps - Seated Long Arc Quad  - 2 x daily - 7 x weekly - 2 sets  - 10 reps - Mini Squat with Counter Support  - 2 x daily - 7 x weekly - 2 sets - 10 reps - Standing Hip Abduction with Counter Support (Mirrored)  - 2 x daily - 7 x weekly - 2 sets - 10 reps - 3s (perform on both sides) hold - Seated Knee Flexion Stretch  - 2 x daily - 7 x weekly - 3 reps - 60s (progress to longer holds) hold - Seated Passive Knee Extension (Mirrored)  - 2 x daily - 7 x weekly - 3 reps - 60s (progress to longer) hold  Patient Education - Total Knee Replacement Handout   ASSESSMENT:  CLINICAL IMPRESSION: Patient demonstrates excellent motivation during session today. She arrives ambulating with a single point cane which therapist has not recommended her to use at this time. BERG balance test performed and pt scored 33/56 which confirms she is a  very high fall risk. Therapist advised her to continue using the front wheeled walker at home and when out in the community. She continues to be inconsistent using compression stockings and is not regularly performing heel elevated R knee extension stretches. Therapist advised pt of the importance of both, with particular focus on knee extension stretches. Her current R knee AAROM with overpressure is -5 to 120 degrees. However she maintains her knee flexed regularly when walking. Continued with strengthening during session today. She demonstrates improved R quad contraction with less extensor lag during SLR. Pt encouraged to continue HEP and follow-up as scheduled. She will benefit from skilled PT to address above impairments and improve overall function.  REHAB POTENTIAL: Excellent  CLINICAL DECISION MAKING: Evolving/moderate complexity  EVALUATION COMPLEXITY: Low   GOALS: Goals reviewed with patient? Yes  SHORT TERM GOALS: Target date: 02/23/2022   Pt will be independent with HEP to improve strength and decrease knee pain to improve pain-free function at home. Baseline:  Goal status: INITIAL   LONG TERM GOALS: Target date:  04/06/2022   Pt will increase FOTO to at least 57 to demonstrate significant improvement in function at home and work related to knee pain  Baseline: 01/12/22: 37 Goal status: INITIAL  2.  Pt will decrease worst knee pain by at least 3 points on the NPRS in order to demonstrate clinically significant reduction in back pain. Baseline: 01/12/22: worst 8/10 Goal status: INITIAL  3.  Pt will increase strength of R quad (perform 10 full SLR without quad lag) in order to demonstrate improvement in strength and function related to R TKR  Baseline: 01/12/22: Able to perform SLR but with notable quad lag Goal status: INITIAL  4.  Pt will increase R knee AAROM to 0 degrees extension and at least 120 degrees of flexion in order to improve function with gait and stairs. Baseline: 01/12/22: -6 to 97 Goal status: INITIAL  5.  Pt will be able to perform sit to stand from regular height chair without UE assist in order to demonstrate improved function related to increase in BLE strength. Baseline: 01/12/22: Unable to perform sit to stand without heavy UE assist Goal status: INITIAL   PLAN: PT FREQUENCY: 2x/week  PT DURATION: 12 weeks  PLANNED INTERVENTIONS: Therapeutic exercises, Therapeutic activity, Neuromuscular re-education, Balance training, Gait training, Patient/Family education, Joint manipulation, Joint mobilization, Canalith repositioning, Aquatic Therapy, Dry Needling, Electrical stimulation, Spinal manipulation, Spinal mobilization, Cryotherapy, Moist heat, Taping, Traction, Ultrasound, Ionotophoresis '4mg'$ /ml Dexamethasone, and Manual therapy  PLAN FOR NEXT SESSION:  review/modify HEP as needed, continue strengthening and stretching.  Lyndel Safe Lauramae Kneisley PT, DPT, G[facility]  Kahron Kauth 01/31/2022, 10:11 AM

## 2022-02-01 ENCOUNTER — Ambulatory Visit: Payer: Medicare Other

## 2022-02-01 DIAGNOSIS — G8929 Other chronic pain: Secondary | ICD-10-CM

## 2022-02-01 DIAGNOSIS — M6281 Muscle weakness (generalized): Secondary | ICD-10-CM

## 2022-02-01 DIAGNOSIS — R262 Difficulty in walking, not elsewhere classified: Secondary | ICD-10-CM

## 2022-02-01 DIAGNOSIS — M25561 Pain in right knee: Secondary | ICD-10-CM | POA: Diagnosis not present

## 2022-02-05 NOTE — Therapy (Signed)
OUTPATIENT PHYSICAL THERAPY KNEE TREATMENT   Patient Name: Charlotte Glover MRN: [id number] DOB:04-20-1928, 88 y.o., female Today's Date: 02/06/2022   PT End of Session - 02/06/22 0926     Visit Number 8    Number of Visits 25    Date for PT Re-Evaluation 04/06/22    Authorization Type eval: 01/12/22    PT Start Time 0930    PT Stop Time 1015    PT Time Calculation (min) 45 min    Activity Tolerance Patient tolerated treatment well    Behavior During Therapy [facility] for tasks assessed/performed             Past Medical History:  Diagnosis Date   (HFpEF) heart failure with preserved ejection fraction ([place]) 06/21/2020   a.) TTE 06/21/2020: EF >55%, RAE, G1DD   Amaurosis fugax of left eye    Arthritis    Atrial fibrillation ([place])    a.) Dx'd in 2009; b.) CHA2DS2-VASc = 6 (age x 2, sex, CHF, HTN, prior MI); c.) s/p PVI 09/31/2013; DCCV (200 J x 1) 04/02/2012, repeat PVI with roof line, coronary sinus isolation, and CFAE ablation 01/02/2013; DCCV 01/18/2013 (200 J x 1); AV nodal ablation 05/28/2014;  d.) rate/rhythm maintained without pharmacological intervention; chronically anticoagulated using rivaroxaban   Cardiac arrest ([place]) 10/2009   a.) witnessed arrest at home; husband performed CPR; ROSC achieved in the field. Admitted for workup --> etiology of CV event undetermined; felt to be secondary to "electrolyte abnormalities".   Cardiac murmur    CHB (complete heart block) ([place])    a.) s/p PPM 05/28/2014; b.) CRT-P upgrade 07/13/2016.   Chickenpox    CKD (chronic kidney disease), stage III (HCC)    Coronary artery disease    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/13/2013   a.) grade II; ER/PR (+)   Dyspnea    Edema    GERD (gastroesophageal reflux disease)    Granulomatous disease ([place])    a.) spleen   Hiatal hernia    HLD (hyperlipidemia)    HOH (hard of hearing)    Hypertension 01/23/2012   [place] esophagus    Long term current use of  antithrombotics/antiplatelets    a.) rivaroxaban   Melanoma of lower leg, left (HCC)    Meningioma, multiple ([place]) 07/14/2019   a.) MRI brain 07/14/2019: 1.2 x 1.3 x 1.3 cm midline posterior fossa meningioma inferior to the cerebellar vertex; 7 x 3 mm dural based meningioma along frontal convexity   NSTEMI (non-ST elevated myocardial infarction) ([place]) 08/2009   Presence of permanent cardiac pacemaker    Pulmonary HTN ([place]) 05/08/2016   a.) R/LHC 05/08/2016: EF >55, LVEDP 15, mean PA 38, AO sat 97, RVSP 50, CO 3 L/min   S/P ablation of atrial fibrillation    Shingles    SSS (sick sinus syndrome) ([place])    a.) CRT-P in place   Valvular heart disease 02/08/2012   a.) cMRI 02/08/2012: EF 50-55, mild-mod AR, mod MR; b.) TEE 02/26/2012: EF >55, mild MR/AR/TR; c.) R/LHC 05/08/2016: sev MR; d.) TEE 06/14/2016: EF >55, sev MR, mod AR/TR; e.) TTE 10/05/2016: EF >55, triv PR, mod AR/MR/TR; f.) TTE 04/22/2018: EF >55, mild MR/TR/PR, mod AR; g.) TTE 11/27/2019: EF 50, mild PR, mod AR/MR/TR; h.) TTE 06/21/2020: EF >55, triv PR, mod AR/MR/TR   Wears hearing aid in both ears    Past Surgical History:  Procedure Laterality Date   ANTERIOR VITRECTOMY Left 03/20/2017   Procedure: ANTERIOR VITRECTOMY;  Surgeon: Leandrew Koyanagi, MD;  Location: [facility];  Service: Ophthalmology;  Laterality: Left;  IVA TOPICAL LEFT   ARTERY BIOPSY Right 04/08/2019   Procedure: BIOPSY TEMPORAL ARTERY;  Surgeon: Algernon Huxley, MD;  Location: [facility];  Service: Vascular;  Laterality: Right;   ATRIAL FIBRILLATION ABLATION N/A 03/24/2012   Procedure: ATRIAL FIBRILLATION ABLATION (PVI); Location: [facility]   ATRIAL FIBRILLATION ABLATION N/A 01/02/2013   Procedure: ATRIAL FIBRILLATION ABLATION (PVI, roof line, coronary sinus siolation, CFAE ablation); Location: [facility]   AV NODE ABLATION N/A 05/26/2014   Procedure: AV NODE ABLATION; Location: [facility]   BIV PACEMAKER INSERTION CRT-P N/A 07/13/2016   Procedure: CRT-P BiV  PACEMAKER UPGRADE; Location: [facility]   BREAST BIOPSY Right 2015   + for DCIS    BREAST SURGERY     CARDIAC CATHETERIZATION Bilateral 05/08/2016   Procedure: Right/Left Heart Cath and Coronary Angiography;  Surgeon: Isaias Cowman, MD;  Location: [facility];  Service: Cardiovascular;  Laterality: Bilateral;   CARDIOVERSION N/A 04/02/2012   Procedure: CARDIOVERSION; Location: [facility]   CARDIOVERSION N/A 01/15/2013   Procedure: CARDIOVERSION; Location: [facility]   CATARACT EXTRACTION W/PHACO Left 03/20/2017   Procedure: IOL EXCHANGE;  Surgeon: Leandrew Koyanagi, MD;  Location: [facility];  Service: Ophthalmology;  Laterality: Left;   CHOLECYSTECTOMY     COLONOSCOPY     patient reports several   DILATION AND CURETTAGE OF UTERUS     ESOPHAGOGASTRODUODENOSCOPY     ESOPHAGOGASTRODUODENOSCOPY (EGD) WITH PROPOFOL N/A 12/13/2020   Procedure: ESOPHAGOGASTRODUODENOSCOPY (EGD) WITH PROPOFOL;  Surgeon: Lesly Rubenstein, MD;  Location: [facility];  Service: Endoscopy;  Laterality: N/A;   KNEE ARTHROPLASTY Right 12/25/2021   Procedure: COMPUTER ASSISTED TOTAL KNEE ARTHROPLASTY;  Surgeon: Dereck Leep, MD;  Location: [facility];  Service: Orthopedics;  Laterality: Right;   PACEMAKER INSERTION N/A 05/28/2014   Procedure: PACEMAKER INSERTION (dual chamber); Location: [facility]   PERIPHERAL IRIDOTOMY Left 03/20/2017   Procedure: PERIPHERAL IRIDECTOMY;  Surgeon: Leandrew Koyanagi, MD;  Location: [facility];  Service: Ophthalmology;  Laterality: Left;   ROTATOR CUFF REPAIR Left    TEE WITHOUT CARDIOVERSION N/A 05/16/2016   Procedure: TRANSESOPHAGEAL ECHOCARDIOGRAM (TEE);  Surgeon: Teodoro Spray, MD;  Location: [facility];  Service: Cardiovascular;  Laterality: N/A;   TONSILLECTOMY     uterine polyp     Patient Active Problem List   Diagnosis Date Noted   Total knee replacement status 12/25/2021   Prediabetes 12/14/2021   Acquired thrombophilia ([place]) 08/07/2021   Primary  osteoarthritis of right knee 04/09/2021   Swelling of limb 11/01/2020   Enlarged pulmonary artery ([place]) 05/23/2020   Moderate aortic insufficiency 03/07/2020   Intracranial carotid stenosis, right 05/29/2019   Chronic headaches 05/29/2019   Long term current use of systemic steroids 05/04/2019   Screening for osteoporosis 05/04/2019   Temporal arteritis ([place]) 04/07/2019   Meningioma ([place]) 12/29/2018   Chronic daily headache 11/20/2018   Elevated erythrocyte sedimentation rate 11/05/2018   CKD (chronic kidney disease) stage 3, GFR 30-59 ml/min ([place]) 12/27/2017   Shingles 05/06/2017   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 04/12/2017   Primary osteoarthritis of right shoulder 12/07/2016   Rotator cuff tear, right 12/07/2016   Rotator cuff tendinitis, right 12/07/2016   CHB (complete heart block) ([place]) 06/30/2016   Severe mitral regurgitation 05/29/2016   Pedal edema 04/23/2016   Lung nodule, multiple 12/28/2015   Amnesia, global, transient 04/05/2015   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 06/30/2014  Biventricular cardiac pacemaker in situ 05/18/2014   Sleep disorder 05/06/2014   Diffuse pain 04/06/2014   Fatigue 03/16/2014   Headache 03/16/2014   History of melanoma 11/24/2013   Melanoma ([place]) 11/24/2013   DCIS (ductal carcinoma in situ) 08/11/2013   History of ductal carcinoma in situ (DCIS) of breast 08/11/2013   H/O non-ST elevation myocardial infarction (NSTEMI) 01/01/2013   Hearing impairment 01/01/2013   S/P ablation of atrial fibrillation 07/11/2012   Edema 01/23/2012   Hypertension 01/23/2012   Atrial fibrillation ([place]) 11/27/2011   Coronary artery disease 11/27/2011    PCP: Adin Hector, MD  REFERRING PROVIDER: Dereck Leep, MD  REFERRING DIAGNOSIS: [phone] (ICD-10-CM) - Presence of right artificial knee joint  THERAPY DIAG: Chronic pain of right knee  Muscle weakness (generalized)  Difficulty  in walking, not elsewhere classified  RATIONALE FOR EVALUATION AND TREATMENT: Rehabilitation  ONSET DATE: 12/25/21  FOLLOW UP APPT WITH PROVIDER: Yes    FROM INITIAL EVALUATION  SUBJECTIVE:                                                                                                                                                                                         Chief Complaint: R Knee replacement 12/25/21  Pertinent History Pt underwent R TKR 12/25/21 due to ongoing chronic pain in her knee. No reported post-operative complications with the exception of some post-operative bleeding. She has been regularly icing, elevating, and wearing compression stockings on the RLE. No compression hose upon arrival today. She had HH PT after the surgery and reports that it went well. She has continued performing her HEP including mini squats and standing hip abduction. Two falls since the surgery but denies any trauma to right knee. Husband and family have been assisting with IADLs since the surgery.  Pain:  Pain Intensity: Present: 0/10, Best: 0/10, Worst: 8/10 Pain location: R Knee Pain quality: aching  Radiating pain: No  Swelling: Yes  Numbness/Tingling: No 24-hour pain behavior: Varies throughout the day History of prior back, hip, or knee injury, pain, surgery, or therapy: No, chronic R knee pain but no prior history of R knee or hip surgery Falls: Has patient fallen in last 6 months? 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  Recent memory is intact.  Remote memory is intact.  Attention span and concentration are intact.  Expressive speech is intact.  Patient's fund of knowledge is within normal limits for educational level.     Gross Musculoskeletal Assessment Tremor: None Bulk: Mild swelling around R knee. Bandage is covering incision however pt indicates that the bandage is wet from her shower yesterday and she doesn't know if she should change the dressing. She does not have any additional dressings at home to use. Tone: Normal   GAIT: Distance walked: 200' with front wheeled walker. Antalgic with decreased stance time on RLE. Decreased self-selected gait speed but functional for limited community mobility Assistive device utilized: Walker - 2 wheeled Level of assistance: Modified independence   Posture: Forward head and rounded shoulders, mild resting R knee flexion  AROM  AROM (Normal range in degrees) AROM  02/06/2022  Lumbar   Flexion (65)   Extension (30)   Right lateral flexion (25)   Left lateral flexion (25)   Right rotation (30)   Left rotation (30)       Hip Right Left  Flexion (125)    Extension (15)    Abduction (40)    Adduction     Internal Rotation (45)    External Rotation (45)        Knee    Flexion (135) 97 135  Extension (0) -6 +2 (hyperextension)      Ankle    Dorsiflexion (20)    Plantarflexion (50)    Inversion (35)    Eversion (15    (* = pain; Blank rows = not tested)   LE MMT:  MMT (out of 5) Right 02/06/2022 Left 02/06/2022  Hip flexion 3+ 3+  Hip extension    Hip abduction    Hip adduction    Hip internal rotation    Hip external rotation     Knee flexion 5 5  Knee extension 5 5  Ankle dorsiflexion 4 4  Ankle plantarflexion Active Active  Ankle inversion    Ankle eversion    (* = pain; Blank rows = not tested)   Sensation Deferred   Reflexes Deferred  Muscle Length Hamstrings: R: Positive for decreased length at 70 degrees L: Not examined  Palpation Mild pain to palpation at medial and lateral knee. Hematoma palpated in R anteromedial thigh just proximal to the knee joint.   Passive Accessory Motion Deferred  VASCULAR Deferred   SPECIAL TESTS Deferred   TODAY'S TREATMENT (02/06/22)  SUBJECTIVE: Pt reports that she is doing well today. No pain reported in knee upon arrival. HEP is going well. She has not been consistent performing her knee extension stretching. She arrives ambulating with her front wheeled walker. No specific questions currently.   PAIN: Denies pain   Ther-ex  NuStep L1-3 BLE only x 5 minutes for warm-up during interval history (3 minutes unbilled); Sit to stand without UE support from regular height chair with Airex pad on seat 2 x 5; Standing mini squats x 15; Standing exercises with 4# ankle weights (AW): Hip flexion marches x 15 BLE; Hip abduction x 15 BLE; Hamstring curls x 15; Hip extension x 15;  Seated LAQ  with 4# AW 2 x 15 BLE; Seated clams x 15 BLE, with manual resistance from therapist Seated adductor squeeze x 15 BLE, with manual resistance from therapist; Standing R TKE with blue tband resistance 2 x 15; Standing heel raises in // bars with BUE support and cues to contract R quad near terminal extension x 15; Supine R quad set with heel on towel roll to stretch into extension with 3s hold x 15; Supine R SLR hip flexion initiating with quad set x 15;   Manual Therapy  Supine R knee extension stretch with heel on towel roll and therapist providing gentle STM to R posterior knee/hamstrings; Supine R patella superior mobilizations grade II-III, 3 x 30s; Cold pack  applied to R knee at end of session with pt in supine and heel elevated on towel roll with knee in full extension x 5 minutes (unbilled);   Not performed today: Hooklying bridges with arms at side x 10; Supine R knee flexion stretch with pt performing active heel slide and therapist providing gentle overpressure at end range flexion 3 x 30s; Supine R tibia on femur AP mobilizations at available end range flexion to improve flexion, grade I-II, 30s/bout x 3 bouts; Supine R tibia on femur PA mobilizations at available end range extension to improve extension, grade I-II, 30s/bout x 3 bouts; Seated R hamstring step stretch 2 x 60s; Supine R tibia external rotation mobilizations at available end range to improve extension, grade I-II, 30s/bout x 2 bouts; Scar massage to superior 2/3 of scar in multiple directions, avoided last 1.5" of scar due to continued healing with eschar covering incision;    PATIENT EDUCATION:  Education details: Pt educated throughout session about proper posture and technique with exercises. Improved exercise technique, movement at target joints, use of target muscles after min to mod verbal, visual, tactile cues, focus on passive R knee extension stretches;  Person educated: Patient and husband Education method: Explanation Education comprehension: verbalized understanding   HOME EXERCISE PROGRAM: Access Code: [email or web address] URL: [email or web address] Date: 01/14/2022 Prepared by: Roxana Hires  Exercises - Supine Ankle Pumps  - 2 x daily - 7 x weekly - 2 sets - 10 reps - Supine Quadricep Sets (Mirrored)  - 2 x daily - 7 x weekly - 2 sets - 10 reps - 3s hold - Supine Active Straight Leg Raise  - 2 x daily - 7 x weekly - 2 sets - 10 reps - Supine Heel Slides  - 2 x daily - 7 x weekly - 2 sets - 10 reps - Seated Long Arc Quad  - 2 x daily - 7 x weekly - 2 sets - 10 reps - Mini Squat with Counter Support  - 2 x daily - 7 x weekly - 2 sets - 10 reps -  Standing Hip Abduction with Counter Support (Mirrored)  - 2 x daily - 7 x weekly - 2 sets - 10 reps - 3s (perform on both sides) hold - Seated Knee Flexion Stretch  - 2 x daily - 7 x weekly - 3 reps - 60s (progress to longer holds) hold - Seated Passive Knee Extension (Mirrored)  - 2 x daily - 7 x weekly - 3 reps - 60s (progress to longer) hold  Patient Education - Total Knee Replacement Handout   ASSESSMENT:  CLINICAL IMPRESSION: Patient demonstrates excellent motivation during session today. She arrives ambulating with her front wheeled walker today. Continued with strengthening during session today in both standing and supine positions. She  demonstrates improved R quad contraction with less extensor lag during SLR but quad is still weak and fatigues quickly. She tolerates active R knee extension better than passive or therapist assisted range of motion. She has not remained consistent with passive knee extension stretches since last therapy session so reinforced importance. Pt encouraged to continue additional HEP. Follow-up as scheduled. She will benefit from skilled PT to address above impairments and improve overall function.  REHAB POTENTIAL: Excellent  CLINICAL DECISION MAKING: Evolving/moderate complexity  EVALUATION COMPLEXITY: Low   GOALS: Goals reviewed with patient? Yes  SHORT TERM GOALS: Target date: 02/23/2022   Pt will be independent with HEP to improve strength and decrease knee pain to improve pain-free function at home. Baseline:  Goal status: INITIAL   LONG TERM GOALS: Target date: 04/06/2022   Pt will increase FOTO to at least 57 to demonstrate significant improvement in function at home and work related to knee pain  Baseline: 01/12/22: 37 Goal status: INITIAL  2.  Pt will decrease worst knee pain by at least 3 points on the NPRS in order to demonstrate clinically significant reduction in back pain. Baseline: 01/12/22: worst 8/10 Goal status: INITIAL  3.  Pt  will increase strength of R quad (perform 10 full SLR without quad lag) in order to demonstrate improvement in strength and function related to R TKR  Baseline: 01/12/22: Able to perform SLR but with notable quad lag Goal status: INITIAL  4.  Pt will increase R knee AAROM to 0 degrees extension and at least 120 degrees of flexion in order to improve function with gait and stairs. Baseline: 01/12/22: -6 to 97 Goal status: INITIAL  5.  Pt will be able to perform sit to stand from regular height chair without UE assist in order to demonstrate improved function related to increase in BLE strength. Baseline: 01/12/22: Unable to perform sit to stand without heavy UE assist Goal status: INITIAL   PLAN: PT FREQUENCY: 2x/week  PT DURATION: 12 weeks  PLANNED INTERVENTIONS: Therapeutic exercises, Therapeutic activity, Neuromuscular re-education, Balance training, Gait training, Patient/Family education, Joint manipulation, Joint mobilization, Canalith repositioning, Aquatic Therapy, Dry Needling, Electrical stimulation, Spinal manipulation, Spinal mobilization, Cryotherapy, Moist heat, Taping, Traction, Ultrasound, Ionotophoresis '4mg'$ /ml Dexamethasone, and Manual therapy  PLAN FOR NEXT SESSION:  review/modify HEP as needed, continue strengthening and stretching.  Lyndel Safe Katrenia Alkins PT, DPT, GCS  Yaniyah Koors 02/06/2022, 1:00 PM

## 2022-02-06 ENCOUNTER — Ambulatory Visit: Payer: Medicare Other

## 2022-02-06 DIAGNOSIS — G8929 Other chronic pain: Secondary | ICD-10-CM

## 2022-02-06 DIAGNOSIS — M6281 Muscle weakness (generalized): Secondary | ICD-10-CM

## 2022-02-06 DIAGNOSIS — R262 Difficulty in walking, not elsewhere classified: Secondary | ICD-10-CM

## 2022-02-06 DIAGNOSIS — M25561 Pain in right knee: Secondary | ICD-10-CM | POA: Diagnosis not present

## 2022-02-12 NOTE — Therapy (Signed)
OUTPATIENT PHYSICAL THERAPY KNEE TREATMENT   Patient Name: Charlotte Glover MRN: [id number] DOB:03/03/1928, 88 y.o., female Today's Date: 02/13/2022   PT End of Session - 02/13/22 1031     Visit Number 9    Number of Visits 25    Date for PT Re-Evaluation 04/06/22    Authorization Type eval: 01/12/22    PT Start Time 1024    PT Stop Time 1105    PT Time Calculation (min) 41 min    Activity Tolerance Patient tolerated treatment well    Behavior During Therapy [facility] for tasks assessed/performed              Past Medical History:  Diagnosis Date   (HFpEF) heart failure with preserved ejection fraction ([place]) 06/21/2020   a.) TTE 06/21/2020: EF >55%, RAE, G1DD   Amaurosis fugax of left eye    Arthritis    Atrial fibrillation ([place])    a.) Dx'd in 2009; b.) CHA2DS2-VASc = 6 (age x 2, sex, CHF, HTN, prior MI); c.) s/p PVI 09/31/2013; DCCV (200 J x 1) 04/02/2012, repeat PVI with roof line, coronary sinus isolation, and CFAE ablation 01/02/2013; DCCV 01/18/2013 (200 J x 1); AV nodal ablation 05/28/2014;  d.) rate/rhythm maintained without pharmacological intervention; chronically anticoagulated using rivaroxaban   Cardiac arrest ([place]) 10/2009   a.) witnessed arrest at home; husband performed CPR; ROSC achieved in the field. Admitted for workup --> etiology of CV event undetermined; felt to be secondary to "electrolyte abnormalities".   Cardiac murmur    CHB (complete heart block) ([place])    a.) s/p PPM 05/28/2014; b.) CRT-P upgrade 07/13/2016.   Chickenpox    CKD (chronic kidney disease), stage III (HCC)    Coronary artery disease    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/13/2013   a.) grade II; ER/PR (+)   Dyspnea    Edema    GERD (gastroesophageal reflux disease)    Granulomatous disease ([place])    a.) spleen   Hiatal hernia    HLD (hyperlipidemia)    HOH (hard of hearing)    Hypertension 01/23/2012   Jackhammer esophagus    Long term current use of  antithrombotics/antiplatelets    a.) rivaroxaban   Melanoma of lower leg, left (HCC)    Meningioma, multiple ([place]) 07/14/2019   a.) MRI brain 07/14/2019: 1.2 x 1.3 x 1.3 cm midline posterior fossa meningioma inferior to the cerebellar vertex; 7 x 3 mm dural based meningioma along frontal convexity   NSTEMI (non-ST elevated myocardial infarction) ([place]) 08/2009   Presence of permanent cardiac pacemaker    Pulmonary HTN ([place]) 05/08/2016   a.) R/LHC 05/08/2016: EF >55, LVEDP 15, mean PA 38, AO sat 97, RVSP 50, CO 3 L/min   S/P ablation of atrial fibrillation    Shingles    SSS (sick sinus syndrome) ([place])    a.) CRT-P in place   Valvular heart disease 02/08/2012   a.) cMRI 02/08/2012: EF 50-55, mild-mod AR, mod MR; b.) TEE 02/26/2012: EF >55, mild MR/AR/TR; c.) R/LHC 05/08/2016: sev MR; d.) TEE 06/14/2016: EF >55, sev MR, mod AR/TR; e.) TTE 10/05/2016: EF >55, triv PR, mod AR/MR/TR; f.) TTE 04/22/2018: EF >55, mild MR/TR/PR, mod AR; g.) TTE 11/27/2019: EF 50, mild PR, mod AR/MR/TR; h.) TTE 06/21/2020: EF >55, triv PR, mod AR/MR/TR   Wears hearing aid in both ears    Past Surgical History:  Procedure Laterality Date   ANTERIOR VITRECTOMY Left 03/20/2017   Procedure: ANTERIOR VITRECTOMY;  Surgeon: Leandrew Koyanagi, MD;  Location: [facility];  Service: Ophthalmology;  Laterality: Left;  IVA TOPICAL LEFT   ARTERY BIOPSY Right 04/08/2019   Procedure: BIOPSY TEMPORAL ARTERY;  Surgeon: Algernon Huxley, MD;  Location: [facility];  Service: Vascular;  Laterality: Right;   ATRIAL FIBRILLATION ABLATION N/A 03/24/2012   Procedure: ATRIAL FIBRILLATION ABLATION (PVI); Location: [facility]   ATRIAL FIBRILLATION ABLATION N/A 01/02/2013   Procedure: ATRIAL FIBRILLATION ABLATION (PVI, roof line, coronary sinus siolation, CFAE ablation); Location: [facility]   AV NODE ABLATION N/A 05/26/2014   Procedure: AV NODE ABLATION; Location: [facility]   BIV PACEMAKER INSERTION CRT-P N/A 07/13/2016   Procedure: CRT-P BiV  PACEMAKER UPGRADE; Location: [facility]   BREAST BIOPSY Right 2015   + for DCIS    BREAST SURGERY     CARDIAC CATHETERIZATION Bilateral 05/08/2016   Procedure: Right/Left Heart Cath and Coronary Angiography;  Surgeon: Isaias Cowman, MD;  Location: [facility];  Service: Cardiovascular;  Laterality: Bilateral;   CARDIOVERSION N/A 04/02/2012   Procedure: CARDIOVERSION; Location: [facility]   CARDIOVERSION N/A 01/15/2013   Procedure: CARDIOVERSION; Location: [facility]   CATARACT EXTRACTION W/PHACO Left 03/20/2017   Procedure: IOL EXCHANGE;  Surgeon: Leandrew Koyanagi, MD;  Location: [facility];  Service: Ophthalmology;  Laterality: Left;   CHOLECYSTECTOMY     COLONOSCOPY     patient reports several   DILATION AND CURETTAGE OF UTERUS     ESOPHAGOGASTRODUODENOSCOPY     ESOPHAGOGASTRODUODENOSCOPY (EGD) WITH PROPOFOL N/A 12/13/2020   Procedure: ESOPHAGOGASTRODUODENOSCOPY (EGD) WITH PROPOFOL;  Surgeon: Lesly Rubenstein, MD;  Location: [facility];  Service: Endoscopy;  Laterality: N/A;   KNEE ARTHROPLASTY Right 12/25/2021   Procedure: COMPUTER ASSISTED TOTAL KNEE ARTHROPLASTY;  Surgeon: Dereck Leep, MD;  Location: [facility];  Service: Orthopedics;  Laterality: Right;   PACEMAKER INSERTION N/A 05/28/2014   Procedure: PACEMAKER INSERTION (dual chamber); Location: [facility]   PERIPHERAL IRIDOTOMY Left 03/20/2017   Procedure: PERIPHERAL IRIDECTOMY;  Surgeon: Leandrew Koyanagi, MD;  Location: [facility];  Service: Ophthalmology;  Laterality: Left;   ROTATOR CUFF REPAIR Left    TEE WITHOUT CARDIOVERSION N/A 05/16/2016   Procedure: TRANSESOPHAGEAL ECHOCARDIOGRAM (TEE);  Surgeon: Teodoro Spray, MD;  Location: [facility];  Service: Cardiovascular;  Laterality: N/A;   TONSILLECTOMY     uterine polyp     Patient Active Problem List   Diagnosis Date Noted   Total knee replacement status 12/25/2021   Prediabetes 12/14/2021   Acquired thrombophilia ([place]) 08/07/2021   Primary  osteoarthritis of right knee 04/09/2021   Swelling of limb 11/01/2020   Enlarged pulmonary artery ([place]) 05/23/2020   Moderate aortic insufficiency 03/07/2020   Intracranial carotid stenosis, right 05/29/2019   Chronic headaches 05/29/2019   Long term current use of systemic steroids 05/04/2019   Screening for osteoporosis 05/04/2019   Temporal arteritis ([place]) 04/07/2019   Meningioma ([place]) 12/29/2018   Chronic daily headache 11/20/2018   Elevated erythrocyte sedimentation rate 11/05/2018   CKD (chronic kidney disease) stage 3, GFR 30-59 ml/min ([place]) 12/27/2017   Shingles 05/06/2017   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 04/12/2017   Primary osteoarthritis of right shoulder 12/07/2016   Rotator cuff tear, right 12/07/2016   Rotator cuff tendinitis, right 12/07/2016   CHB (complete heart block) ([place]) 06/30/2016   Severe mitral regurgitation 05/29/2016   Pedal edema 04/23/2016   Lung nodule, multiple 12/28/2015   Amnesia, global, transient 04/05/2015   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 06/30/2014  Biventricular cardiac pacemaker in situ 05/18/2014   Sleep disorder 05/06/2014   Diffuse pain 04/06/2014   Fatigue 03/16/2014   Headache 03/16/2014   History of melanoma 11/24/2013   Melanoma ([place]) 11/24/2013   DCIS (ductal carcinoma in situ) 08/11/2013   History of ductal carcinoma in situ (DCIS) of breast 08/11/2013   H/O non-ST elevation myocardial infarction (NSTEMI) 01/01/2013   Hearing impairment 01/01/2013   S/P ablation of atrial fibrillation 07/11/2012   Edema 01/23/2012   Hypertension 01/23/2012   Atrial fibrillation ([place]) 11/27/2011   Coronary artery disease 11/27/2011    PCP: Adin Hector, MD  REFERRING PROVIDER: Dereck Leep, MD  REFERRING DIAGNOSIS: [phone] (ICD-10-CM) - Presence of right artificial knee joint  THERAPY DIAG: Chronic pain of right knee  Muscle weakness (generalized)  Difficulty  in walking, not elsewhere classified  RATIONALE FOR EVALUATION AND TREATMENT: Rehabilitation  ONSET DATE: 12/25/21  FOLLOW UP APPT WITH PROVIDER: Yes    FROM INITIAL EVALUATION  SUBJECTIVE:                                                                                                                                                                                         Chief Complaint: R Knee replacement 12/25/21  Pertinent History Pt underwent R TKR 12/25/21 due to ongoing chronic pain in her knee. No reported post-operative complications with the exception of some post-operative bleeding. She has been regularly icing, elevating, and wearing compression stockings on the RLE. No compression hose upon arrival today. She had HH PT after the surgery and reports that it went well. She has continued performing her HEP including mini squats and standing hip abduction. Two falls since the surgery but denies any trauma to right knee. Husband and family have been assisting with IADLs since the surgery.  Pain:  Pain Intensity: Present: 0/10, Best: 0/10, Worst: 8/10 Pain location: R Knee Pain quality: aching  Radiating pain: No  Swelling: Yes  Numbness/Tingling: No 24-hour pain behavior: Varies throughout the day History of prior back, hip, or knee injury, pain, surgery, or therapy: No, chronic R knee pain but no prior history of R knee or hip surgery Falls: Has patient fallen in last 6 months? 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  Recent memory is intact.  Remote memory is intact.  Attention span and concentration are intact.  Expressive speech is intact.  Patient's fund of knowledge is within normal limits for educational level.     Gross Musculoskeletal Assessment Tremor: None Bulk: Mild swelling around R knee. Bandage is covering incision however pt indicates that the bandage is wet from her shower yesterday and she doesn't know if she should change the dressing. She does not have any additional dressings at home to use. Tone: Normal   GAIT: Distance walked: 200' with front wheeled walker. Antalgic with decreased stance time on RLE. Decreased self-selected gait speed but functional for limited community mobility Assistive device utilized: Walker - 2 wheeled Level of assistance: Modified independence   Posture: Forward head and rounded shoulders, mild resting R knee flexion  AROM  AROM (Normal range in degrees) AROM  02/13/2022  Lumbar   Flexion (65)   Extension (30)   Right lateral flexion (25)   Left lateral flexion (25)   Right rotation (30)   Left rotation (30)       Hip Right Left  Flexion (125)    Extension (15)    Abduction (40)    Adduction     Internal Rotation (45)    External Rotation (45)        Knee    Flexion (135) 97 135  Extension (0) -6 +2 (hyperextension)      Ankle    Dorsiflexion (20)    Plantarflexion (50)    Inversion (35)    Eversion (15    (* = pain; Blank rows = not tested)   LE MMT:  MMT (out of 5) Right 02/13/2022 Left 02/13/2022  Hip flexion 3+ 3+  Hip extension    Hip abduction    Hip adduction    Hip internal rotation    Hip external rotation     Knee flexion 5 5  Knee extension 5 5  Ankle dorsiflexion 4 4  Ankle plantarflexion Active Active  Ankle inversion    Ankle eversion    (* = pain; Blank rows = not tested)   Sensation Deferred   Reflexes Deferred  Muscle Length Hamstrings: R: Positive for decreased length at 70 degrees L: Not examined  Palpation Mild pain to palpation at medial and lateral knee. Hematoma palpated in R anteromedial thigh just proximal to the knee joint.   Passive Accessory Motion Deferred  VASCULAR Deferred   SPECIAL TESTS Deferred   TODAY'S TREATMENT  SUBJECTIVE: Pt reports that she is doing well today. She reports continued warmth in R knee. Denies chills, fevers, or increase in RLE swelling. 2/10 R knee pain upon arrival.  HEP is going well. She arrives ambulating with her single point cane. No specific questions currently.   PAIN: Denies pain   Ther-ex  NuStep L1 BLE only x 5 minutes for warm-up during interval history (3 minutes unbilled); Sit to stand without UE support from regular height chair with Airex pad on seat 2 x 5; Standing mini squats 2 x 10; Standing exercises with 5# ankle weights (AW): Hip flexion marches x 15 BLE; Hip abduction x 15 BLE; Hamstring curls x 15; Hip extension  x 15;  Seated LAQ with 5# AW 2 x 15 BLE; Standing R TKE with blue tband resistance 2 x 15; Hooklying clams x 15 BLE, with manual resistance from therapist Hooklying adductor squeeze x 15 BLE, with manual resistance from therapist; Supine R quad set with heel on towel roll to stretch into extension with 3s hold x 15; Supine R SLR hip flexion initiating with quad set x 15; Supine RLE heel slide with manually resisted extension x 10;   Manual Therapy  Supine R knee extension stretch with heel on towel roll and therapist providing gentle STM to R posterior knee/hamstrings; Supine R patella superior mobilizations grade II-III, 3 x 30s; Supine R tibia external rotation mobilizations at  available end range to improve extension, grade I-II, 30s/bout x 2 bouts; Supine R hamstring stretch x 30s; Cold pack applied to R knee at end of session with pt in supine and heel elevated on towel roll with knee in full extension x 5 minutes (unbilled);   R knee AAROM (end of session) -3 to 120 degrees   Not performed today: Standing heel raises in // bars with BUE support and cues to contract R quad near terminal extension x 15; Hooklying bridges with arms at side x 10; Supine R knee flexion stretch with pt performing active heel slide and therapist providing gentle overpressure at end range flexion 3 x 30s; Supine R tibia on femur AP mobilizations at available end range flexion to improve flexion, grade I-II, 30s/bout x 3 bouts; Supine R tibia on femur PA mobilizations at available end range extension to improve extension, grade I-II, 30s/bout x 3 bouts; Scar massage to superior 2/3 of scar in multiple directions, avoided last 1.5" of scar due to continued healing with eschar covering incision;   PATIENT EDUCATION:  Education details: Pt educated throughout session about proper posture and technique with exercises. Improved exercise technique, movement at target joints, use of target muscles after min to mod verbal, visual, tactile cues, focus on passive R knee extension stretches;  Person educated: Patient and husband Education method: Explanation Education comprehension: verbalized understanding   HOME EXERCISE PROGRAM: Access Code: [email or web address] URL: [email or web address] Date: 01/14/2022 Prepared by: Roxana Hires  Exercises - Supine Ankle Pumps  - 2 x daily - 7 x weekly - 2 sets - 10 reps - Supine Quadricep Sets (Mirrored)  - 2 x daily - 7 x weekly - 2 sets - 10 reps - 3s hold - Supine Active Straight Leg Raise  - 2 x daily - 7 x weekly - 2 sets - 10 reps - Supine Heel Slides  - 2 x daily - 7 x weekly - 2 sets - 10 reps - Seated Long Arc Quad  - 2 x daily - 7 x  weekly - 2 sets - 10 reps - Mini Squat with Counter Support  - 2 x daily - 7 x weekly - 2 sets - 10 reps - Standing Hip Abduction with Counter Support (Mirrored)  - 2 x daily - 7 x weekly - 2 sets - 10 reps - 3s (perform on both sides) hold - Seated Knee Flexion Stretch  - 2 x daily - 7 x weekly - 3 reps - 60s (progress to longer holds) hold - Seated Passive Knee Extension (Mirrored)  - 2 x daily - 7 x weekly - 3 reps - 60s (progress to longer) hold  Patient Education - Total Knee Replacement Handout   ASSESSMENT:  CLINICAL IMPRESSION: Patient demonstrates excellent motivation during  session today. She arrives ambulating with her single point cane despite instruction from therapist to continue using her front wheeled walker. Continued with strengthening during session today in both standing and supine positions. Also continued with manual techniques primarily to improve extnesion. Her R knee AAROM at the end of the session is -3 to 120 degrees. She has not remained consistent with passive knee extension stretches since last therapy session so reinforced importance. Pt encouraged to continue additional HEP. Follow-up as scheduled. She will benefit from skilled PT to address above impairments and improve overall function.  REHAB POTENTIAL: Excellent  CLINICAL DECISION MAKING: Evolving/moderate complexity  EVALUATION COMPLEXITY: Low   GOALS: Goals reviewed with patient? Yes  SHORT TERM GOALS: Target date: 02/23/2022   Pt will be independent with HEP to improve strength and decrease knee pain to improve pain-free function at home. Baseline:  Goal status: INITIAL   LONG TERM GOALS: Target date: 04/06/2022   Pt will increase FOTO to at least 57 to demonstrate significant improvement in function at home and work related to knee pain  Baseline: 01/12/22: 37 Goal status: INITIAL  2.  Pt will decrease worst knee pain by at least 3 points on the NPRS in order to demonstrate clinically  significant reduction in back pain. Baseline: 01/12/22: worst 8/10 Goal status: INITIAL  3.  Pt will increase strength of R quad (perform 10 full SLR without quad lag) in order to demonstrate improvement in strength and function related to R TKR  Baseline: 01/12/22: Able to perform SLR but with notable quad lag Goal status: INITIAL  4.  Pt will increase R knee AAROM to 0 degrees extension and at least 120 degrees of flexion in order to improve function with gait and stairs. Baseline: 01/12/22: -6 to 97 Goal status: INITIAL  5.  Pt will be able to perform sit to stand from regular height chair without UE assist in order to demonstrate improved function related to increase in BLE strength. Baseline: 01/12/22: Unable to perform sit to stand without heavy UE assist Goal status: INITIAL   PLAN: PT FREQUENCY: 2x/week  PT DURATION: 12 weeks  PLANNED INTERVENTIONS: Therapeutic exercises, Therapeutic activity, Neuromuscular re-education, Balance training, Gait training, Patient/Family education, Joint manipulation, Joint mobilization, Canalith repositioning, Aquatic Therapy, Dry Needling, Electrical stimulation, Spinal manipulation, Spinal mobilization, Cryotherapy, Moist heat, Taping, Traction, Ultrasound, Ionotophoresis '4mg'$ /ml Dexamethasone, and Manual therapy  PLAN FOR NEXT SESSION:  progress note, review/modify HEP as needed, continue strengthening and stretching.  Lyndel Safe Syvilla Martin PT, DPT, GCS  Deann Mclaine 02/13/2022, 1:38 PM

## 2022-02-13 ENCOUNTER — Ambulatory Visit: Payer: Medicare Other

## 2022-02-13 DIAGNOSIS — R262 Difficulty in walking, not elsewhere classified: Secondary | ICD-10-CM

## 2022-02-13 DIAGNOSIS — M25561 Pain in right knee: Secondary | ICD-10-CM | POA: Diagnosis not present

## 2022-02-13 DIAGNOSIS — G8929 Other chronic pain: Secondary | ICD-10-CM

## 2022-02-13 DIAGNOSIS — M6281 Muscle weakness (generalized): Secondary | ICD-10-CM

## 2022-02-16 ENCOUNTER — Ambulatory Visit: Payer: Medicare Other

## 2022-02-16 DIAGNOSIS — M25561 Pain in right knee: Secondary | ICD-10-CM | POA: Diagnosis not present

## 2022-02-16 DIAGNOSIS — M6281 Muscle weakness (generalized): Secondary | ICD-10-CM

## 2022-02-16 DIAGNOSIS — G8929 Other chronic pain: Secondary | ICD-10-CM

## 2022-02-16 DIAGNOSIS — R262 Difficulty in walking, not elsewhere classified: Secondary | ICD-10-CM

## 2022-02-16 NOTE — Therapy (Addendum)
OUTPATIENT PHYSICAL THERAPY KNEE TREATMENT/PROGRESS NOTE  Dates of reporting period  01/12/22   to   02/16/22    Patient Name: Charlotte Glover MRN: [id number] DOB:03-17-1928, 88 y.o., female Today's Date: 02/16/2022   PT End of Session - 02/16/22 1058     Visit Number 10    Number of Visits 25    Date for PT Re-Evaluation 04/06/22    Authorization Type eval: 01/12/22    PT Start Time 1054    PT Stop Time 1139    PT Time Calculation (min) 45 min    Activity Tolerance Patient tolerated treatment well    Behavior During Therapy [facility] for tasks assessed/performed               Past Medical History:  Diagnosis Date   (HFpEF) heart failure with preserved ejection fraction ([place]) 06/21/2020   a.) TTE 06/21/2020: EF >55%, RAE, G1DD   Amaurosis fugax of left eye    Arthritis    Atrial fibrillation ([place])    a.) Dx'd in 2009; b.) CHA2DS2-VASc = 6 (age x 2, sex, CHF, HTN, prior MI); c.) s/p PVI 09/31/2013; DCCV (200 J x 1) 04/02/2012, repeat PVI with roof line, coronary sinus isolation, and CFAE ablation 01/02/2013; DCCV 01/18/2013 (200 J x 1); AV nodal ablation 05/28/2014;  d.) rate/rhythm maintained without pharmacological intervention; chronically anticoagulated using rivaroxaban   Cardiac arrest ([place]) 10/2009   a.) witnessed arrest at home; husband performed CPR; ROSC achieved in the field. Admitted for workup --> etiology of CV event undetermined; felt to be secondary to "electrolyte abnormalities".   Cardiac murmur    CHB (complete heart block) ([place])    a.) s/p PPM 05/28/2014; b.) CRT-P upgrade 07/13/2016.   Chickenpox    CKD (chronic kidney disease), stage III (HCC)    Coronary artery disease    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/13/2013   a.) grade II; ER/PR (+)   Dyspnea    Edema    GERD (gastroesophageal reflux disease)    Granulomatous disease ([place])    a.) spleen   Hiatal hernia    HLD (hyperlipidemia)    HOH (hard of hearing)    Hypertension  01/23/2012   [place] esophagus    Long term current use of antithrombotics/antiplatelets    a.) rivaroxaban   Melanoma of lower leg, left (HCC)    Meningioma, multiple ([place]) 07/14/2019   a.) MRI brain 07/14/2019: 1.2 x 1.3 x 1.3 cm midline posterior fossa meningioma inferior to the cerebellar vertex; 7 x 3 mm dural based meningioma along frontal convexity   NSTEMI (non-ST elevated myocardial infarction) ([place]) 08/2009   Presence of permanent cardiac pacemaker    Pulmonary HTN ([place]) 05/08/2016   a.) R/LHC 05/08/2016: EF >55, LVEDP 15, mean PA 38, AO sat 97, RVSP 50, CO 3 L/min   S/P ablation of atrial fibrillation    Shingles    SSS (sick sinus syndrome) ([place])    a.) CRT-P in place   Valvular heart disease 02/08/2012   a.) cMRI 02/08/2012: EF 50-55, mild-mod AR, mod MR; b.) TEE 02/26/2012: EF >55, mild MR/AR/TR; c.) R/LHC 05/08/2016: sev MR; d.) TEE 06/14/2016: EF >55, sev MR, mod AR/TR; e.) TTE 10/05/2016: EF >55, triv PR, mod AR/MR/TR; f.) TTE 04/22/2018: EF >55, mild MR/TR/PR, mod AR; g.) TTE 11/27/2019: EF 50, mild PR, mod AR/MR/TR; h.) TTE 06/21/2020: EF >55, triv PR, mod AR/MR/TR   Wears hearing aid in both ears    Past Surgical  History:  Procedure Laterality Date   ANTERIOR VITRECTOMY Left 03/20/2017   Procedure: ANTERIOR VITRECTOMY;  Surgeon: Leandrew Koyanagi, MD;  Location: [facility];  Service: Ophthalmology;  Laterality: Left;  IVA TOPICAL LEFT   ARTERY BIOPSY Right 04/08/2019   Procedure: BIOPSY TEMPORAL ARTERY;  Surgeon: Algernon Huxley, MD;  Location: [facility];  Service: Vascular;  Laterality: Right;   ATRIAL FIBRILLATION ABLATION N/A 03/24/2012   Procedure: ATRIAL FIBRILLATION ABLATION (PVI); Location: [facility]   ATRIAL FIBRILLATION ABLATION N/A 01/02/2013   Procedure: ATRIAL FIBRILLATION ABLATION (PVI, roof line, coronary sinus siolation, CFAE ablation); Location: [facility]   AV NODE ABLATION N/A 05/26/2014   Procedure: AV NODE ABLATION; Location: [facility]   BIV  PACEMAKER INSERTION CRT-P N/A 07/13/2016   Procedure: CRT-P BiV PACEMAKER UPGRADE; Location: [facility]   BREAST BIOPSY Right 2015   + for DCIS    BREAST SURGERY     CARDIAC CATHETERIZATION Bilateral 05/08/2016   Procedure: Right/Left Heart Cath and Coronary Angiography;  Surgeon: Isaias Cowman, MD;  Location: [facility];  Service: Cardiovascular;  Laterality: Bilateral;   CARDIOVERSION N/A 04/02/2012   Procedure: CARDIOVERSION; Location: [facility]   CARDIOVERSION N/A 01/15/2013   Procedure: CARDIOVERSION; Location: [facility]   CATARACT EXTRACTION W/PHACO Left 03/20/2017   Procedure: IOL EXCHANGE;  Surgeon: Leandrew Koyanagi, MD;  Location: [facility];  Service: Ophthalmology;  Laterality: Left;   CHOLECYSTECTOMY     COLONOSCOPY     patient reports several   DILATION AND CURETTAGE OF UTERUS     ESOPHAGOGASTRODUODENOSCOPY     ESOPHAGOGASTRODUODENOSCOPY (EGD) WITH PROPOFOL N/A 12/13/2020   Procedure: ESOPHAGOGASTRODUODENOSCOPY (EGD) WITH PROPOFOL;  Surgeon: Lesly Rubenstein, MD;  Location: [facility];  Service: Endoscopy;  Laterality: N/A;   KNEE ARTHROPLASTY Right 12/25/2021   Procedure: COMPUTER ASSISTED TOTAL KNEE ARTHROPLASTY;  Surgeon: Dereck Leep, MD;  Location: [facility];  Service: Orthopedics;  Laterality: Right;   PACEMAKER INSERTION N/A 05/28/2014   Procedure: PACEMAKER INSERTION (dual chamber); Location: [facility]   PERIPHERAL IRIDOTOMY Left 03/20/2017   Procedure: PERIPHERAL IRIDECTOMY;  Surgeon: Leandrew Koyanagi, MD;  Location: [facility];  Service: Ophthalmology;  Laterality: Left;   ROTATOR CUFF REPAIR Left    TEE WITHOUT CARDIOVERSION N/A 05/16/2016   Procedure: TRANSESOPHAGEAL ECHOCARDIOGRAM (TEE);  Surgeon: Teodoro Spray, MD;  Location: [facility];  Service: Cardiovascular;  Laterality: N/A;   TONSILLECTOMY     uterine polyp     Patient Active Problem List   Diagnosis Date Noted   Total knee replacement status 12/25/2021   Prediabetes  12/14/2021   Acquired thrombophilia ([place]) 08/07/2021   Primary osteoarthritis of right knee 04/09/2021   Swelling of limb 11/01/2020   Enlarged pulmonary artery ([place]) 05/23/2020   Moderate aortic insufficiency 03/07/2020   Intracranial carotid stenosis, right 05/29/2019   Chronic headaches 05/29/2019   Long term current use of systemic steroids 05/04/2019   Screening for osteoporosis 05/04/2019   Temporal arteritis ([place]) 04/07/2019   Meningioma ([place]) 12/29/2018   Chronic daily headache 11/20/2018   Elevated erythrocyte sedimentation rate 11/05/2018   CKD (chronic kidney disease) stage 3, GFR 30-59 ml/min ([place]) 12/27/2017   Shingles 05/06/2017   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 04/12/2017   Primary osteoarthritis of right shoulder 12/07/2016   Rotator cuff tear, right 12/07/2016   Rotator cuff tendinitis, right 12/07/2016   CHB (complete heart block) ([place]) 06/30/2016   Severe mitral regurgitation 05/29/2016   Pedal edema 04/23/2016   Lung nodule, multiple 12/28/2015   Amnesia,  global, transient 04/05/2015   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 06/30/2014   Biventricular cardiac pacemaker in situ 05/18/2014   Sleep disorder 05/06/2014   Diffuse pain 04/06/2014   Fatigue 03/16/2014   Headache 03/16/2014   History of melanoma 11/24/2013   Melanoma ([place]) 11/24/2013   DCIS (ductal carcinoma in situ) 08/11/2013   History of ductal carcinoma in situ (DCIS) of breast 08/11/2013   H/O non-ST elevation myocardial infarction (NSTEMI) 01/01/2013   Hearing impairment 01/01/2013   S/P ablation of atrial fibrillation 07/11/2012   Edema 01/23/2012   Hypertension 01/23/2012   Atrial fibrillation ([place]) 11/27/2011   Coronary artery disease 11/27/2011    PCP: Adin Hector, MD  REFERRING PROVIDER: Dereck Leep, MD  REFERRING DIAGNOSIS: [phone] (ICD-10-CM) - Presence of right artificial knee joint  THERAPY DIAG: Chronic  pain of right knee  Muscle weakness (generalized)  Difficulty in walking, not elsewhere classified  RATIONALE FOR EVALUATION AND TREATMENT: Rehabilitation  ONSET DATE: 12/25/21  FOLLOW UP APPT WITH PROVIDER: Yes    FROM INITIAL EVALUATION  SUBJECTIVE:                                                                                                                                                                                         Chief Complaint: R Knee replacement 12/25/21  Pertinent History Pt underwent R TKR 12/25/21 due to ongoing chronic pain in her knee. No reported post-operative complications with the exception of some post-operative bleeding. She has been regularly icing, elevating, and wearing compression stockings on the RLE. No compression hose upon arrival today. She had HH PT after the surgery and reports that it went well. She has continued performing her HEP including mini squats and standing hip abduction. Two falls since the surgery but denies any trauma to right knee. Husband and family have been assisting with IADLs since the surgery.  Pain:  Pain Intensity: Present: 0/10, Best: 0/10, Worst: 8/10 Pain location: R Knee Pain quality: aching  Radiating pain: No  Swelling: Yes  Numbness/Tingling: No 24-hour pain behavior: Varies throughout the day History of prior back, hip, or knee injury, pain, surgery, or therapy: No, chronic R knee pain but no prior history of R knee or hip surgery Falls: Has patient fallen in last 6 months? 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  Recent memory is intact.  Remote memory is intact.  Attention span and concentration are intact.  Expressive speech is intact.  Patient's fund of knowledge is within normal limits for educational level.     Gross Musculoskeletal Assessment Tremor: None Bulk: Mild swelling around R knee. Bandage is covering incision however pt indicates that the bandage is wet from her shower yesterday and she doesn't know if she should change the dressing. She does not have any additional dressings at home to use. Tone: Normal   GAIT: Distance walked: 200' with front wheeled walker. Antalgic with decreased stance time on RLE. Decreased self-selected gait speed but functional for limited community mobility Assistive device utilized: Walker - 2 wheeled Level of assistance: Modified independence   Posture: Forward head and rounded shoulders, mild resting R knee flexion  AROM  AROM (Normal range in degrees) AROM  02/16/2022  Lumbar   Flexion (65)   Extension (30)   Right lateral flexion (25)   Left lateral flexion (25)   Right rotation (30)   Left rotation (30)       Hip Right Left  Flexion (125)    Extension (15)    Abduction (40)    Adduction     Internal Rotation (45)    External Rotation (45)        Knee    Flexion (135) 97 135  Extension (0) -6 +2 (hyperextension)      Ankle    Dorsiflexion (20)    Plantarflexion (50)    Inversion (35)    Eversion (15    (* = pain; Blank rows = not tested)   LE MMT:  MMT (out of 5) Right 02/16/2022 Left 02/16/2022  Hip flexion 3+ 3+  Hip extension    Hip abduction    Hip  adduction    Hip internal rotation    Hip external rotation    Knee flexion 5 5  Knee extension 5 5  Ankle dorsiflexion 4 4  Ankle plantarflexion Active Active  Ankle inversion    Ankle eversion    (* = pain; Blank rows = not tested)   Sensation Deferred   Reflexes Deferred  Muscle Length Hamstrings: R: Positive for decreased length at 70 degrees L: Not examined  Palpation Mild pain to palpation at medial and lateral knee. Hematoma palpated in R anteromedial thigh just proximal to the knee joint.   Passive Accessory Motion Deferred  VASCULAR Deferred   SPECIAL TESTS Deferred   TODAY'S TREATMENT  SUBJECTIVE: Pt reports that she is doing well today. She denies any resting knee pain upon arrival. She has had some anterolateral calf soreness over the last couple days. HEP is going well. She arrives ambulating with her front wheeled walker. No specific questions currently.   PAIN: Denies pain   Ther-ex  NuStep L1-2 BLE only x 5 minutes for warm-up during interval history (3 minutes unbilled);  Updated outcome measures with patient: FOTO: 57 Knee AAROM: -3 to 122, able to perform SLR x 10 without quad lag; Sit to stand: Unable to perform from a regular  height chair without UE assist;  Worst pain: 1/10;  Sit to stand without UE support from regular height chair with Airex pad on seat 2 x 5; Standing mini squats x 15; Standing heel raises in // bars with BUE support and cues to contract R quad near terminal extension x 15;  Standing exercises with 5# ankle weights (AW): Hip flexion marches x 15 BLE; Hip abduction x 18 BLE; Hip extension x 15;  Seated LAQ with 5# AW 2 x 15 BLE; Standing R TKE with blue tband resistance x 15; Supine R quad set with heel on towel roll to stretch into extension with 3s hold x 10; Supine R SLR hip flexion initiating with quad set x 10; Supine RLE heel slide with manually resisted extension x 10;   Manual Therapy  Supine R knee  extension stretch with heel on towel roll and therapist providing gentle STM to R posterior knee/hamstrings; Supine R patella superior mobilizations grade II-III, 3 x 30s; Supine R tibia external rotation mobilizations at available end range to improve extension, grade I-II, 30s/bout x 2 b3uts; Supine R hamstring stretch x 30s; Cold pack applied to R knee at end of session with pt in supine and heel elevated on towel roll with knee in full extension x 5 minutes (unbilled);  R knee AAROM (end of session) -3 to 122 degrees   Not performed today: Hooklying bridges with arms at side x 10; Supine R knee flexion stretch with pt performing active heel slide and therapist providing gentle overpressure at end range flexion 3 x 30s; Supine R tibia on femur AP mobilizations at available end range flexion to improve flexion, grade I-II, 30s/bout x 3 bouts; Supine R tibia on femur PA mobilizations at available end range extension to improve extension, grade I-II, 30s/bout x 3 bouts; Scar massage to superior 2/3 of scar in multiple directions, avoided last 1.5" of scar due to continued healing with eschar covering incision; Hooklying clams x 15 BLE, with manual resistance from therapist Hooklying adductor squeeze x 15 BLE, with manual resistance from therapist;   PATIENT EDUCATION:  Education details: Pt educated throughout session about proper posture and technique with exercises. Improved exercise technique, movement at target joints, use of target muscles after min to mod verbal, visual, tactile cues, focus on passive R knee extension stretches;  Person educated: Patient and husband Education method: Explanation Education comprehension: verbalized understanding   HOME EXERCISE PROGRAM: Access Code: [email or web address] URL: [email or web address] Date: 01/14/2022 Prepared by: Roxana Hires  Exercises - Supine Ankle Pumps  - 2 x daily - 7 x weekly - 2 sets - 10 reps - Supine Quadricep Sets  (Mirrored)  - 2 x daily - 7 x weekly - 2 sets - 10 reps - 3s hold - Supine Active Straight Leg Raise  - 2 x daily - 7 x weekly - 2 sets - 10 reps - Supine Heel Slides  - 2 x daily - 7 x weekly - 2 sets - 10 reps - Seated Long Arc Quad  - 2 x daily - 7 x weekly - 2 sets - 10 reps - Mini Squat with Counter Support  - 2 x daily - 7 x weekly - 2 sets - 10 reps - Standing Hip Abduction with Counter Support (Mirrored)  - 2 x daily - 7 x weekly - 2 sets - 10 reps - 3s (perform on both sides) hold - Seated Knee Flexion Stretch  - 2 x daily - 7 x weekly -  3 reps - 60s (progress to longer holds) hold - Seated Passive Knee Extension (Mirrored)  - 2 x daily - 7 x weekly - 3 reps - 60s (progress to longer) hold  Patient Education - Total Knee Replacement Handout   ASSESSMENT:  CLINICAL IMPRESSION: Patient demonstrates excellent motivation during session today. She arrives ambulating with her front wheeled walker as therapist has encouraged continued use. Progressed strengthening during session today in both standing, seated, and supine positions. Also continued with manual techniques primarily to improve extension. Her R knee AAROM at the end of the session is -3 to 122 degrees. She struggles to remain consistent with heel elevated passive knee extension stretches at home due to discomfort. Pt encouraged to continue additional HEP. She has an appointment with Dr. Marry Guan next week. Pt advised to follow-up as scheduled. She will benefit from skilled PT to address above impairments and improve overall function.  REHAB POTENTIAL: Excellent  CLINICAL DECISION MAKING: Evolving/moderate complexity  EVALUATION COMPLEXITY: Low   GOALS: Goals reviewed with patient? Yes  SHORT TERM GOALS: Target date: 02/23/2022   Pt will be independent with HEP to improve strength and decrease knee pain to improve pain-free function at home. Baseline:  Goal status: INITIAL   LONG TERM GOALS: Target date: 04/06/2022   Pt  will increase FOTO to at least 57 to demonstrate significant improvement in function at home and work related to knee pain  Baseline: 01/12/22: 37, 02/15/22: 57 Goal status: ACHIEVED  2.  Pt will decrease worst knee pain by at least 3 points on the NPRS in order to demonstrate clinically significant reduction in back pain. Baseline: 01/12/22: worst 8/10; 02/16/22: 1/10; Goal status: ACHIEVED  3.  Pt will increase strength of R quad (perform 10 full SLR without quad lag) in order to demonstrate improvement in strength and function related to R TKR  Baseline: 01/12/22: Able to perform SLR but with notable quad lag; 02/16/22: SLR x 10 without quad lag Goal status: ACHIEVED  4.  Pt will increase R knee AAROM to 0 degrees extension and at least 120 degrees of flexion in order to improve function with gait and stairs. Baseline: 01/12/22: -6 to 97; 02/16/22: -3 TO 122;  Goal status: PARTIALLY MET  5.  Pt will be able to perform sit to stand from regular height chair without UE assist in order to demonstrate improved function related to increase in BLE strength. Baseline: 01/12/22: Unable to perform sit to stand without heavy UE assist; 02/16/22: Unable to perform from a regular height chair without UE assist but strength improving compared to initial evaluation; Goal status: INITIAL   PLAN: PT FREQUENCY: 2x/week  PT DURATION: 12 weeks  PLANNED INTERVENTIONS: Therapeutic exercises, Therapeutic activity, Neuromuscular re-education, Balance training, Gait training, Patient/Family education, Joint manipulation, Joint mobilization, Canalith repositioning, Aquatic Therapy, Dry Needling, Electrical stimulation, Spinal manipulation, Spinal mobilization, Cryotherapy, Moist heat, Taping, Traction, Ultrasound, Ionotophoresis 56m/ml Dexamethasone, and Manual therapy  PLAN FOR NEXT SESSION:  review/modify HEP as needed, continue strengthening and stretching.  JLyndel SafeHuprich PT, DPT, GCS  Charlotte Glover 02/16/2022,  1:04 PM

## 2022-02-19 NOTE — Therapy (Signed)
OUTPATIENT PHYSICAL THERAPY KNEE TREATMENT   Patient Name: Charlotte Glover MRN: [id number] DOB:March 07, 1928, 88 y.o., female Today's Date: 02/20/2022   PT End of Session - 02/20/22 1447     Visit Number 11    Number of Visits 25    Date for PT Re-Evaluation 04/06/22    Authorization Type eval: 01/12/22    PT Start Time 1445    PT Stop Time 1530    PT Time Calculation (min) 45 min    Activity Tolerance Patient tolerated treatment well    Behavior During Therapy [facility] for tasks assessed/performed                Past Medical History:  Diagnosis Date   (HFpEF) heart failure with preserved ejection fraction ([place]) 06/21/2020   a.) TTE 06/21/2020: EF >55%, RAE, G1DD   Amaurosis fugax of left eye    Arthritis    Atrial fibrillation ([place])    a.) Dx'd in 2009; b.) CHA2DS2-VASc = 6 (age x 2, sex, CHF, HTN, prior MI); c.) s/p PVI 09/31/2013; DCCV (200 J x 1) 04/02/2012, repeat PVI with roof line, coronary sinus isolation, and CFAE ablation 01/02/2013; DCCV 01/18/2013 (200 J x 1); AV nodal ablation 05/28/2014;  d.) rate/rhythm maintained without pharmacological intervention; chronically anticoagulated using rivaroxaban   Cardiac arrest ([place]) 10/2009   a.) witnessed arrest at home; husband performed CPR; ROSC achieved in the field. Admitted for workup --> etiology of CV event undetermined; felt to be secondary to "electrolyte abnormalities".   Cardiac murmur    CHB (complete heart block) ([place])    a.) s/p PPM 05/28/2014; b.) CRT-P upgrade 07/13/2016.   Chickenpox    CKD (chronic kidney disease), stage III (HCC)    Coronary artery disease    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/13/2013   a.) grade II; ER/PR (+)   Dyspnea    Edema    GERD (gastroesophageal reflux disease)    Granulomatous disease ([place])    a.) spleen   Hiatal hernia    HLD (hyperlipidemia)    HOH (hard of hearing)    Hypertension 01/23/2012   [place] esophagus    Long term current use of  antithrombotics/antiplatelets    a.) rivaroxaban   Melanoma of lower leg, left (HCC)    Meningioma, multiple ([place]) 07/14/2019   a.) MRI brain 07/14/2019: 1.2 x 1.3 x 1.3 cm midline posterior fossa meningioma inferior to the cerebellar vertex; 7 x 3 mm dural based meningioma along frontal convexity   NSTEMI (non-ST elevated myocardial infarction) ([place]) 08/2009   Presence of permanent cardiac pacemaker    Pulmonary HTN ([place]) 05/08/2016   a.) R/LHC 05/08/2016: EF >55, LVEDP 15, mean PA 38, AO sat 97, RVSP 50, CO 3 L/min   S/P ablation of atrial fibrillation    Shingles    SSS (sick sinus syndrome) ([place])    a.) CRT-P in place   Valvular heart disease 02/08/2012   a.) cMRI 02/08/2012: EF 50-55, mild-mod AR, mod MR; b.) TEE 02/26/2012: EF >55, mild MR/AR/TR; c.) R/LHC 05/08/2016: sev MR; d.) TEE 06/14/2016: EF >55, sev MR, mod AR/TR; e.) TTE 10/05/2016: EF >55, triv PR, mod AR/MR/TR; f.) TTE 04/22/2018: EF >55, mild MR/TR/PR, mod AR; g.) TTE 11/27/2019: EF 50, mild PR, mod AR/MR/TR; h.) TTE 06/21/2020: EF >55, triv PR, mod AR/MR/TR   Wears hearing aid in both ears    Past Surgical History:  Procedure Laterality Date   ANTERIOR VITRECTOMY Left 03/20/2017   Procedure:  ANTERIOR VITRECTOMY;  Surgeon: Leandrew Koyanagi, MD;  Location: [place];  Service: Ophthalmology;  Laterality: Left;  IVA TOPICAL LEFT   ARTERY BIOPSY Right 04/08/2019   Procedure: BIOPSY TEMPORAL ARTERY;  Surgeon: Algernon Huxley, MD;  Location: [facility];  Service: Vascular;  Laterality: Right;   ATRIAL FIBRILLATION ABLATION N/A 03/24/2012   Procedure: ATRIAL FIBRILLATION ABLATION (PVI); Location: [facility]   ATRIAL FIBRILLATION ABLATION N/A 01/02/2013   Procedure: ATRIAL FIBRILLATION ABLATION (PVI, roof line, coronary sinus siolation, CFAE ablation); Location: [facility]   AV NODE ABLATION N/A 05/26/2014   Procedure: AV NODE ABLATION; Location: [facility]   BIV PACEMAKER INSERTION CRT-P N/A 07/13/2016   Procedure: CRT-P BiV  PACEMAKER UPGRADE; Location: [facility]   BREAST BIOPSY Right 2015   + for DCIS    BREAST SURGERY     CARDIAC CATHETERIZATION Bilateral 05/08/2016   Procedure: Right/Left Heart Cath and Coronary Angiography;  Surgeon: Isaias Cowman, MD;  Location: [place] [facility];  Service: Cardiovascular;  Laterality: Bilateral;   CARDIOVERSION N/A 04/02/2012   Procedure: CARDIOVERSION; Location: [facility]   CARDIOVERSION N/A 01/15/2013   Procedure: CARDIOVERSION; Location: [facility]   CATARACT EXTRACTION W/PHACO Left 03/20/2017   Procedure: IOL EXCHANGE;  Surgeon: Leandrew Koyanagi, MD;  Location: [place];  Service: Ophthalmology;  Laterality: Left;   CHOLECYSTECTOMY     COLONOSCOPY     patient reports several   DILATION AND CURETTAGE OF UTERUS     ESOPHAGOGASTRODUODENOSCOPY     ESOPHAGOGASTRODUODENOSCOPY (EGD) WITH PROPOFOL N/A 12/13/2020   Procedure: ESOPHAGOGASTRODUODENOSCOPY (EGD) WITH PROPOFOL;  Surgeon: Lesly Rubenstein, MD;  Location: [facility];  Service: Endoscopy;  Laterality: N/A;   KNEE ARTHROPLASTY Right 12/25/2021   Procedure: COMPUTER ASSISTED TOTAL KNEE ARTHROPLASTY;  Surgeon: Dereck Leep, MD;  Location: [facility];  Service: Orthopedics;  Laterality: Right;   PACEMAKER INSERTION N/A 05/28/2014   Procedure: PACEMAKER INSERTION (dual chamber); Location: [facility]   PERIPHERAL IRIDOTOMY Left 03/20/2017   Procedure: PERIPHERAL IRIDECTOMY;  Surgeon: Leandrew Koyanagi, MD;  Location: [place];  Service: Ophthalmology;  Laterality: Left;   ROTATOR CUFF REPAIR Left    TEE WITHOUT CARDIOVERSION N/A 05/16/2016   Procedure: TRANSESOPHAGEAL ECHOCARDIOGRAM (TEE);  Surgeon: Teodoro Spray, MD;  Location: [facility];  Service: Cardiovascular;  Laterality: N/A;   TONSILLECTOMY     uterine polyp     Patient Active Problem List   Diagnosis Date Noted   Total knee replacement status 12/25/2021   Prediabetes 12/14/2021   Acquired thrombophilia ([place]) 08/07/2021   Primary  osteoarthritis of right knee 04/09/2021   Swelling of limb 11/01/2020   Enlarged pulmonary artery ([place]) 05/23/2020   Moderate aortic insufficiency 03/07/2020   Intracranial carotid stenosis, right 05/29/2019   Chronic headaches 05/29/2019   Long term current use of systemic steroids 05/04/2019   Screening for osteoporosis 05/04/2019   Temporal arteritis ([place]) 04/07/2019   Meningioma ([place]) 12/29/2018   Chronic daily headache 11/20/2018   Elevated erythrocyte sedimentation rate 11/05/2018   CKD (chronic kidney disease) stage 3, GFR 30-59 ml/min ([place]) 12/27/2017   Shingles 05/06/2017   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 04/12/2017   Primary osteoarthritis of right shoulder 12/07/2016   Rotator cuff tear, right 12/07/2016   Rotator cuff tendinitis, right 12/07/2016   CHB (complete heart block) ([place]) 06/30/2016   Severe mitral regurgitation 05/29/2016   Pedal edema 04/23/2016   Lung nodule, multiple 12/28/2015   Amnesia, global, transient 04/05/2015   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 06/30/2014   Biventricular cardiac pacemaker in situ 05/18/2014   Sleep disorder 05/06/2014   Diffuse pain 04/06/2014   Fatigue 03/16/2014   Headache 03/16/2014   History of melanoma 11/24/2013   Melanoma ([place]) 11/24/2013   DCIS (ductal carcinoma in situ) 08/11/2013   History of ductal carcinoma in situ (DCIS) of breast 08/11/2013   H/O non-ST elevation myocardial infarction (NSTEMI) 01/01/2013   Hearing impairment 01/01/2013   S/P ablation of atrial fibrillation 07/11/2012   Edema 01/23/2012   Hypertension 01/23/2012   Atrial fibrillation ([place]) 11/27/2011   Coronary artery disease 11/27/2011    PCP: Adin Hector, MD  REFERRING PROVIDER: Dereck Leep, MD  REFERRING DIAGNOSIS: [phone] (ICD-10-CM) - Presence of right artificial knee joint  THERAPY DIAG: Chronic pain of right knee  Muscle weakness (generalized)  RATIONALE  FOR EVALUATION AND TREATMENT: Rehabilitation  ONSET DATE: 12/25/21  FOLLOW UP APPT WITH PROVIDER: Yes    FROM INITIAL EVALUATION  SUBJECTIVE:                                                                                                                                                                                         Chief Complaint: R Knee replacement 12/25/21  Pertinent History Pt underwent R TKR 12/25/21 due to ongoing chronic pain in her knee. No reported post-operative complications with the exception of some post-operative bleeding. She has been regularly icing, elevating, and wearing compression stockings on the RLE. No compression hose upon arrival today. She had HH PT after the surgery and reports that it went well. She has continued performing her HEP including mini squats and standing hip abduction. Two falls since the surgery but denies any trauma to right knee. Husband and family have been assisting with IADLs since the surgery.  Pain:  Pain Intensity: Present: 0/10, Best: 0/10, Worst: 8/10 Pain location: R Knee Pain quality: aching  Radiating pain: No  Swelling: Yes  Numbness/Tingling: No 24-hour pain behavior: Varies throughout the day History of prior back, hip, or knee injury, pain, surgery, or therapy: No, chronic R knee pain but no prior history of R knee or hip surgery Falls: Has patient fallen in last 6 months? 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  Recent memory is intact.  Remote memory is intact.  Attention span and concentration are intact.  Expressive speech is intact.  Patient's fund of knowledge is within normal limits for educational level.     Gross Musculoskeletal Assessment Tremor: None Bulk: Mild swelling around R knee. Bandage is covering incision however pt indicates that the bandage is wet from her shower yesterday and she doesn't know if she should change the dressing. She does not have any additional dressings at home to use. Tone: Normal   GAIT: Distance walked: 200' with front wheeled walker. Antalgic with decreased stance time on RLE. Decreased self-selected gait speed but functional for limited community mobility Assistive device utilized: Walker - 2 wheeled Level of assistance: Modified independence   Posture: Forward head and rounded shoulders, mild resting R knee flexion  AROM  AROM (Normal range in degrees) AROM  02/20/2022  Lumbar   Flexion (65)   Extension (30)   Right lateral flexion (25)   Left lateral flexion (25)   Right rotation (30)   Left rotation (30)       Hip Right Left  Flexion (125)    Extension (15)    Abduction (40)    Adduction     Internal Rotation (45)    External Rotation (45)        Knee    Flexion (135) 97 135  Extension (0) -6 +2 (hyperextension)      Ankle    Dorsiflexion (20)    Plantarflexion (50)    Inversion (35)    Eversion (15    (* = pain; Blank rows = not tested)   LE MMT:  MMT (out of 5) Right 02/20/2022 Left 02/20/2022  Hip flexion 3+ 3+  Hip extension    Hip abduction    Hip adduction    Hip internal rotation    Hip external rotation    Knee flexion 5 5  Knee extension 5 5  Ankle  dorsiflexion 4 4  Ankle plantarflexion Active Active  Ankle inversion    Ankle eversion    (* = pain; Blank rows = not tested)   Sensation Deferred   Reflexes Deferred  Muscle Length Hamstrings: R: Positive for decreased length at 70 degrees L: Not examined  Palpation Mild pain to palpation at medial and lateral knee. Hematoma palpated in R anteromedial thigh just proximal to the knee joint.   Passive Accessory Motion Deferred  VASCULAR Deferred   SPECIAL TESTS Deferred   TODAY'S TREATMENT  SUBJECTIVE: Pt reports that she is doing well today. She denies any resting knee pain upon arrival but states that her knee is still sensitive. She saw Dr. Marry Guan this morning and per patient he was very pleased with her progress. HEP is going well. She arrives ambulating with her front wheeled walker. No specific questions currently.   PAIN: Denies pain   Ther-ex  NuStep L1-2 BUE/BLE only x 5 minutes for warm-up during interval history (3 minutes unbilled);  Sit to stand without UE support from regular height chair with Airex pad on seat 2 x 10; Standing mini squats x 15; Standing heel raises in // bars with BUE support and cues to contract R quad near  terminal extension x 15;  Standing exercises with 5# ankle weights (AW): Hip flexion marches x 15 BLE; Hamstring curls x 20 BLE; Hip abduction x 15 BLE; Hip extension x 15 BLE;  Seated LAQ with 5# AW 2 x 15 BLE; Hooklying clams 2 x 15 BLE, with manual resistance from therapist Hooklying adductor squeeze 2 x 15 BLE, with manual resistance from therapist; Standing R TKE with blue tband resistance 2 x 15; Side stepping with 5# AW x 6 lengths;   Not performed today: Supine R quad set with heel on towel roll to stretch into extension with 3s hold x 10; Supine R SLR hip flexion initiating with quad set x 10; Supine RLE heel slide with manually resisted extension x 10; Hooklying bridges with arms at side x 10; Supine R knee  flexion stretch with pt performing active heel slide and therapist providing gentle overpressure at end range flexion 3 x 30s; Supine R tibia on femur AP mobilizations at available end range flexion to improve flexion, grade I-II, 30s/bout x 3 bouts; Supine R tibia on femur PA mobilizations at available end range extension to improve extension, grade I-II, 30s/bout x 3 bouts; Scar massage to superior 2/3 of scar in multiple directions, avoided last 1.5" of scar due to continued healing with eschar covering incision; Supine R knee extension stretch with heel on towel roll and therapist providing gentle STM to R posterior knee/hamstrings; Supine R patella superior mobilizations grade II-III, 3 x 30s; Supine R tibia external rotation mobilizations at available end range to improve extension, grade I-II, 30s/bout x 2 b3uts; Supine R hamstring stretch x 30s;    PATIENT EDUCATION:  Education details: Pt educated throughout session about proper posture and technique with exercises. Improved exercise technique, movement at target joints, use of target muscles after min to mod verbal, visual, tactile cues, focus on passive R knee extension stretches;  Person educated: Patient and husband Education method: Explanation Education comprehension: verbalized understanding   HOME EXERCISE PROGRAM: Access Code: [email or web address] URL: [email or web address] Date: 01/14/2022 Prepared by: Roxana Hires  Exercises - Supine Ankle Pumps  - 2 x daily - 7 x weekly - 2 sets - 10 reps - Supine Quadricep Sets (Mirrored)  - 2 x daily - 7 x weekly - 2 sets - 10 reps - 3s hold - Supine Active Straight Leg Raise  - 2 x daily - 7 x weekly - 2 sets - 10 reps - Supine Heel Slides  - 2 x daily - 7 x weekly - 2 sets - 10 reps - Seated Long Arc Quad  - 2 x daily - 7 x weekly - 2 sets - 10 reps - Mini Squat with Counter Support  - 2 x daily - 7 x weekly - 2 sets - 10 reps - Standing Hip Abduction with Counter Support  (Mirrored)  - 2 x daily - 7 x weekly - 2 sets - 10 reps - 3s (perform on both sides) hold - Seated Knee Flexion Stretch  - 2 x daily - 7 x weekly - 3 reps - 60s (progress to longer holds) hold - Seated Passive Knee Extension (Mirrored)  - 2 x daily - 7 x weekly - 3 reps - 60s (progress to longer) hold  Patient Education - Total Knee Replacement Handout   ASSESSMENT:  CLINICAL IMPRESSION: Patient demonstrates excellent motivation during session today. She arrives ambulating with her front wheeled walker and continues to report unsteadiness. Progressed strengthening during session today in both standing and  seated positions. She is able to increase repetitions with sit to stand exercises and also add additional sets to some of her other exercises as well. Added resisted side stepping to improve directional change. Manual techniques deferred today. Pt encouraged to continue HEP and to follow-up as scheduled. She will benefit from skilled PT to address above impairments and improve overall function.  REHAB POTENTIAL: Excellent  CLINICAL DECISION MAKING: Evolving/moderate complexity  EVALUATION COMPLEXITY: Low   GOALS: Goals reviewed with patient? Yes  SHORT TERM GOALS: Target date: 02/23/2022   Pt will be independent with HEP to improve strength and decrease knee pain to improve pain-free function at home. Baseline:  Goal status: INITIAL   LONG TERM GOALS: Target date: 04/06/2022   Pt will increase FOTO to at least 57 to demonstrate significant improvement in function at home and work related to knee pain  Baseline: 01/12/22: 37, 02/15/22: 57 Goal status: ACHIEVED  2.  Pt will decrease worst knee pain by at least 3 points on the NPRS in order to demonstrate clinically significant reduction in back pain. Baseline: 01/12/22: worst 8/10; 02/16/22: 1/10; Goal status: ACHIEVED  3.  Pt will increase strength of R quad (perform 10 full SLR without quad lag) in order to demonstrate improvement  in strength and function related to R TKR  Baseline: 01/12/22: Able to perform SLR but with notable quad lag; 02/16/22: SLR x 10 without quad lag Goal status: ACHIEVED  4.  Pt will increase R knee AAROM to 0 degrees extension and at least 120 degrees of flexion in order to improve function with gait and stairs. Baseline: 01/12/22: -6 to 97; 02/16/22: -3 TO 122;  Goal status: PARTIALLY MET  5.  Pt will be able to perform sit to stand from regular height chair without UE assist in order to demonstrate improved function related to increase in BLE strength. Baseline: 01/12/22: Unable to perform sit to stand without heavy UE assist; 02/16/22: Unable to perform from a regular height chair without UE assist but strength improving compared to initial evaluation; Goal status: INITIAL   PLAN: PT FREQUENCY: 2x/week  PT DURATION: 12 weeks  PLANNED INTERVENTIONS: Therapeutic exercises, Therapeutic activity, Neuromuscular re-education, Balance training, Gait training, Patient/Family education, Joint manipulation, Joint mobilization, Canalith repositioning, Aquatic Therapy, Dry Needling, Electrical stimulation, Spinal manipulation, Spinal mobilization, Cryotherapy, Moist heat, Taping, Traction, Ultrasound, Ionotophoresis 23m/ml Dexamethasone, and Manual therapy  PLAN FOR NEXT SESSION:  review/modify HEP as needed, continue strengthening and stretching.  JLyndel SafeHuprich PT, DPT, GCS  Alazay Leicht 02/20/2022, 3:57 PM

## 2022-02-20 ENCOUNTER — Ambulatory Visit: Payer: Medicare Other

## 2022-02-20 DIAGNOSIS — M25561 Pain in right knee: Secondary | ICD-10-CM | POA: Diagnosis not present

## 2022-02-20 DIAGNOSIS — M6281 Muscle weakness (generalized): Secondary | ICD-10-CM

## 2022-02-20 DIAGNOSIS — G8929 Other chronic pain: Secondary | ICD-10-CM

## 2022-02-27 ENCOUNTER — Ambulatory Visit: Payer: Medicare Other | Attending: Orthopedic Surgery

## 2022-02-27 DIAGNOSIS — R262 Difficulty in walking, not elsewhere classified: Secondary | ICD-10-CM | POA: Insufficient documentation

## 2022-02-27 DIAGNOSIS — M6281 Muscle weakness (generalized): Secondary | ICD-10-CM | POA: Diagnosis present

## 2022-02-27 DIAGNOSIS — G8929 Other chronic pain: Secondary | ICD-10-CM | POA: Insufficient documentation

## 2022-02-27 DIAGNOSIS — R2689 Other abnormalities of gait and mobility: Secondary | ICD-10-CM | POA: Insufficient documentation

## 2022-02-27 DIAGNOSIS — M25561 Pain in right knee: Secondary | ICD-10-CM | POA: Insufficient documentation

## 2022-02-27 NOTE — Therapy (Signed)
OUTPATIENT PHYSICAL THERAPY KNEE TREATMENT   Patient Name: Charlotte Glover MRN: [id number] DOB:[birth year], 88 y.o., female Today's Date: 02/27/2022   PT End of Session - 02/27/22 1416     Visit Number 12    Number of Visits 25    Date for PT Re-Evaluation 04/06/22    Authorization Type eval: 01/12/22    PT Start Time 1401    PT Stop Time 1443    PT Time Calculation (min) 42 min    Activity Tolerance Patient tolerated treatment well    Behavior During Therapy [facility] for tasks assessed/performed                Past Medical History:  Diagnosis Date   (HFpEF) heart failure with preserved ejection fraction ([place]) 06/21/2020   a.) TTE 06/21/2020: EF >55%, RAE, G1DD   Amaurosis fugax of left eye    Arthritis    Atrial fibrillation ([place])    a.) Dx'd in 2009; b.) CHA2DS2-VASc = 6 (age x 2, sex, CHF, HTN, prior MI); c.) s/p PVI 09/31/2013; DCCV (200 J x 1) 04/02/2012, repeat PVI with roof line, coronary sinus isolation, and CFAE ablation 01/02/2013; DCCV 01/18/2013 (200 J x 1); AV nodal ablation 05/28/2014;  d.) rate/rhythm maintained without pharmacological intervention; chronically anticoagulated using rivaroxaban   Cardiac arrest ([place]) 10/2009   a.) witnessed arrest at home; husband performed CPR; ROSC achieved in the field. Admitted for workup --> etiology of CV event undetermined; felt to be secondary to "electrolyte abnormalities".   Cardiac murmur    CHB (complete heart block) ([place])    a.) s/p PPM 05/28/2014; b.) CRT-P upgrade 07/13/2016.   Chickenpox    CKD (chronic kidney disease), stage III (HCC)    Coronary artery disease    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/13/2013   a.) grade II; ER/PR (+)   Dyspnea    Edema    GERD (gastroesophageal reflux disease)    Granulomatous disease ([place])    a.) spleen   Hiatal hernia    HLD (hyperlipidemia)    HOH (hard of hearing)    Hypertension 01/23/2012   [place] esophagus    Long term current use of  antithrombotics/antiplatelets    a.) rivaroxaban   Melanoma of lower leg, left (HCC)    Meningioma, multiple ([place]) 07/14/2019   a.) MRI brain 07/14/2019: 1.2 x 1.3 x 1.3 cm midline posterior fossa meningioma inferior to the cerebellar vertex; 7 x 3 mm dural based meningioma along frontal convexity   NSTEMI (non-ST elevated myocardial infarction) ([place]) 08/2009   Presence of permanent cardiac pacemaker    Pulmonary HTN ([place]) 05/08/2016   a.) R/LHC 05/08/2016: EF >55, LVEDP 15, mean PA 38, AO sat 97, RVSP 50, CO 3 L/min   S/P ablation of atrial fibrillation    Shingles    SSS (sick sinus syndrome) ([place])    a.) CRT-P in place   Valvular heart disease 02/08/2012   a.) cMRI 02/08/2012: EF 50-55, mild-mod AR, mod MR; b.) TEE 02/26/2012: EF >55, mild MR/AR/TR; c.) R/LHC 05/08/2016: sev MR; d.) TEE 06/14/2016: EF >55, sev MR, mod AR/TR; e.) TTE 10/05/2016: EF >55, triv PR, mod AR/MR/TR; f.) TTE 04/22/2018: EF >55, mild MR/TR/PR, mod AR; g.) TTE 11/27/2019: EF 50, mild PR, mod AR/MR/TR; h.) TTE 06/21/2020: EF >55, triv PR, mod AR/MR/TR   Wears hearing aid in both ears    Past Surgical History:  Procedure Laterality Date   ANTERIOR VITRECTOMY Left 03/20/2017   Procedure:  ANTERIOR VITRECTOMY;  Surgeon: Leandrew Koyanagi, MD;  Location: [place];  Service: Ophthalmology;  Laterality: Left;  IVA TOPICAL LEFT   ARTERY BIOPSY Right 04/08/2019   Procedure: BIOPSY TEMPORAL ARTERY;  Surgeon: Algernon Huxley, MD;  Location: [facility];  Service: Vascular;  Laterality: Right;   ATRIAL FIBRILLATION ABLATION N/A 03/24/2012   Procedure: ATRIAL FIBRILLATION ABLATION (PVI); Location: [facility]   ATRIAL FIBRILLATION ABLATION N/A 01/02/2013   Procedure: ATRIAL FIBRILLATION ABLATION (PVI, roof line, coronary sinus siolation, CFAE ablation); Location: [facility]   AV NODE ABLATION N/A 05/26/2014   Procedure: AV NODE ABLATION; Location: [facility]   BIV PACEMAKER INSERTION CRT-P N/A 07/13/2016   Procedure: CRT-P BiV  PACEMAKER UPGRADE; Location: [facility]   BREAST BIOPSY Right 2015   + for DCIS    BREAST SURGERY     CARDIAC CATHETERIZATION Bilateral 05/08/2016   Procedure: Right/Left Heart Cath and Coronary Angiography;  Surgeon: Isaias Cowman, MD;  Location: [facility];  Service: Cardiovascular;  Laterality: Bilateral;   CARDIOVERSION N/A 04/02/2012   Procedure: CARDIOVERSION; Location: [facility]   CARDIOVERSION N/A 01/15/2013   Procedure: CARDIOVERSION; Location: [facility]   CATARACT EXTRACTION W/PHACO Left 03/20/2017   Procedure: IOL EXCHANGE;  Surgeon: Leandrew Koyanagi, MD;  Location: [place];  Service: Ophthalmology;  Laterality: Left;   CHOLECYSTECTOMY     COLONOSCOPY     patient reports several   DILATION AND CURETTAGE OF UTERUS     ESOPHAGOGASTRODUODENOSCOPY     ESOPHAGOGASTRODUODENOSCOPY (EGD) WITH PROPOFOL N/A 12/13/2020   Procedure: ESOPHAGOGASTRODUODENOSCOPY (EGD) WITH PROPOFOL;  Surgeon: Lesly Rubenstein, MD;  Location: [facility];  Service: Endoscopy;  Laterality: N/A;   KNEE ARTHROPLASTY Right 12/25/2021   Procedure: COMPUTER ASSISTED TOTAL KNEE ARTHROPLASTY;  Surgeon: Dereck Leep, MD;  Location: [facility];  Service: Orthopedics;  Laterality: Right;   PACEMAKER INSERTION N/A 05/28/2014   Procedure: PACEMAKER INSERTION (dual chamber); Location: [facility]   PERIPHERAL IRIDOTOMY Left 03/20/2017   Procedure: PERIPHERAL IRIDECTOMY;  Surgeon: Leandrew Koyanagi, MD;  Location: [place];  Service: Ophthalmology;  Laterality: Left;   ROTATOR CUFF REPAIR Left    TEE WITHOUT CARDIOVERSION N/A 05/16/2016   Procedure: TRANSESOPHAGEAL ECHOCARDIOGRAM (TEE);  Surgeon: Teodoro Spray, MD;  Location: [facility];  Service: Cardiovascular;  Laterality: N/A;   TONSILLECTOMY     uterine polyp     Patient Active Problem List   Diagnosis Date Noted   Total knee replacement status 12/25/2021   Prediabetes 12/14/2021   Acquired thrombophilia ([place]) 08/07/2021   Primary  osteoarthritis of right knee 04/09/2021   Swelling of limb 11/01/2020   Enlarged pulmonary artery ([place]) 05/23/2020   Moderate aortic insufficiency 03/07/2020   Intracranial carotid stenosis, right 05/29/2019   Chronic headaches 05/29/2019   Long term current use of systemic steroids 05/04/2019   Screening for osteoporosis 05/04/2019   Temporal arteritis ([place]) 04/07/2019   Meningioma ([place]) 12/29/2018   Chronic daily headache 11/20/2018   Elevated erythrocyte sedimentation rate 11/05/2018   CKD (chronic kidney disease) stage 3, GFR 30-59 ml/min ([place]) 12/27/2017   Shingles 05/06/2017   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 04/12/2017   Primary osteoarthritis of right shoulder 12/07/2016   Rotator cuff tear, right 12/07/2016   Rotator cuff tendinitis, right 12/07/2016   CHB (complete heart block) ([place]) 06/30/2016   Severe mitral regurgitation 05/29/2016   Pedal edema 04/23/2016   Lung nodule, multiple 12/28/2015   Amnesia, global, transient 04/05/2015   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 06/30/2014   Biventricular cardiac pacemaker in situ 05/18/2014   Sleep disorder 05/06/2014   Diffuse pain 04/06/2014   Fatigue 03/16/2014   Headache 03/16/2014   History of melanoma 11/24/2013   Melanoma ([place]) 11/24/2013   DCIS (ductal carcinoma in situ) 08/11/2013   History of ductal carcinoma in situ (DCIS) of breast 08/11/2013   H/O non-ST elevation myocardial infarction (NSTEMI) 01/01/2013   Hearing impairment 01/01/2013   S/P ablation of atrial fibrillation 07/11/2012   Edema 01/23/2012   Hypertension 01/23/2012   Atrial fibrillation ([place]) 11/27/2011   Coronary artery disease 11/27/2011    PCP: Adin Hector, MD  REFERRING PROVIDER: Dereck Leep, MD  REFERRING DIAGNOSIS: [id number] (ICD-10-CM) - Presence of right artificial knee joint  THERAPY DIAG: Chronic pain of right knee  Muscle weakness (generalized)  Difficulty  in walking, not elsewhere classified  Balance problem  Other abnormalities of gait and mobility  RATIONALE FOR EVALUATION AND TREATMENT: Rehabilitation  ONSET DATE: 12/25/21  FOLLOW UP APPT WITH PROVIDER: Yes    FROM INITIAL EVALUATION  SUBJECTIVE:                                                                                                                                                                                         Chief Complaint: R Knee replacement 12/25/21  Pertinent History Pt underwent R TKR 12/25/21 due to ongoing chronic pain in her knee. No reported post-operative complications with the exception of some post-operative bleeding. She has been regularly icing, elevating, and wearing compression stockings on the RLE. No compression hose upon arrival today. She had HH PT after the surgery and reports that it went well. She has continued performing her HEP including mini squats and standing hip abduction. Two falls since the surgery but denies any trauma to right knee. Husband and family have been assisting with IADLs since the surgery.  Pain:  Pain Intensity: Present: 0/10, Best: 0/10, Worst: 8/10 Pain location: R Knee Pain quality: aching  Radiating pain: No  Swelling: Yes  Numbness/Tingling: No 24-hour pain behavior: Varies throughout the day History of prior back, hip, or knee injury, pain, surgery, or therapy: No, chronic R knee pain but no prior history of R knee or hip surgery Falls: Has patient fallen in last 6 months? 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  Recent memory is intact.  Remote memory is intact.  Attention span and concentration are intact.  Expressive speech is intact.  Patient's fund of knowledge is within normal limits for educational level.     Gross Musculoskeletal Assessment Tremor: None Bulk: Mild swelling around R knee. Bandage is covering incision however pt indicates that the bandage is wet from her shower yesterday and she doesn't know if she should change the dressing. She does not have any additional dressings at home to use. Tone: Normal   GAIT: Distance walked: 200' with front wheeled walker. Antalgic with decreased stance time on RLE. Decreased self-selected gait speed but functional for limited community mobility Assistive device utilized: Walker - 2 wheeled Level of assistance: Modified independence   Posture: Forward head and rounded shoulders, mild resting R knee flexion  AROM  AROM (Normal range in degrees) AROM  02/27/2022  Lumbar   Flexion (65)   Extension (30)   Right lateral flexion (25)   Left lateral flexion (25)   Right rotation (30)   Left rotation (30)       Hip Right Left  Flexion (125)    Extension (15)    Abduction (40)    Adduction     Internal Rotation (45)    External Rotation (45)        Knee    Flexion (135) 97 135  Extension (0) -6 +2 (hyperextension)      Ankle    Dorsiflexion (20)    Plantarflexion (50)    Inversion (35)    Eversion (15    (* = pain; Blank rows = not tested)   LE MMT:  MMT (out of 5) Right 02/27/2022 Left 02/27/2022  Hip flexion 3+ 3+  Hip extension    Hip abduction    Hip  adduction    Hip internal rotation    Hip external rotation    Knee flexion 5 5  Knee extension 5 5  Ankle dorsiflexion 4 4  Ankle plantarflexion Active Active  Ankle inversion    Ankle eversion    (* = pain; Blank rows = not tested)   Sensation Deferred   Reflexes Deferred  Muscle Length Hamstrings: R: Positive for decreased length at 70 degrees L: Not examined  Palpation Mild pain to palpation at medial and lateral knee. Hematoma palpated in R anteromedial thigh just proximal to the knee joint.   Passive Accessory Motion Deferred  VASCULAR Deferred   SPECIAL TESTS Deferred   TODAY'S TREATMENT  SUBJECTIVE: Pt reports fatigue today as she has been very busy. Denies falls and no pain.    PAIN: Denies pain   Ther-ex  NuStep L1-2 BUE/BLE only x 5 minutes for warm-up during interval history  Sit to stand without UE support from regular height chair with Airex pad on seat 2 x 5  Standing exercises with 5# ankle weights (AW): Hip flexion marches 2 x 15 BLE; Hamstring curls 2 x 20 BLE; Hip abduction 2 x 15 BLE; Hip extension 2 x 15 BLE;  Seated rest b/t cycles.   Lateral hurdle step overs in // bars:  x6 laps. Mod TC's on pelvis to prevent rotational compensations. SBA.  RLE lunge to improve R knee flexion with BUE support: x8. SBA    PATIENT EDUCATION:  Education details: form/technique with exercise Person educated: Patient Education method: Explanation Education comprehension: verbalized understanding   HOME EXERCISE PROGRAM: Access Code: [email or web address] URL: [email or web address] Date: 01/14/2022 Prepared by: Roxana Hires  Exercises - Supine Ankle Pumps  - 2 x daily - 7 x weekly - 2 sets - 10 reps - Supine Quadricep Sets (Mirrored)  - 2 x daily - 7 x weekly - 2 sets - 10 reps - 3s hold - Supine Active Straight Leg Raise  - 2 x daily - 7 x weekly - 2 sets - 10 reps - Supine Heel Slides  - 2 x daily - 7 x weekly - 2 sets - 10 reps -  Seated Long Arc Quad  - 2 x daily - 7 x weekly - 2 sets - 10 reps - Mini Squat with Counter Support  - 2 x daily - 7 x weekly - 2 sets - 10 reps - Standing Hip Abduction with Counter Support (Mirrored)  - 2 x daily - 7 x weekly - 2 sets - 10 reps - 3s (perform on both sides) hold - Seated Knee Flexion Stretch  - 2 x daily - 7 x weekly - 3 reps - 60s (progress to longer holds) hold - Seated Passive Knee Extension (Mirrored)  - 2 x daily - 7 x weekly - 3 reps - 60s (progress to longer) hold  Patient Education - Total Knee Replacement Handout   ASSESSMENT:  CLINICAL IMPRESSION: Continuing PT POC with focus on LE strengthening and RLE mobility. Session a little limited today due to subjective reports of fatigue from afternoon session compared to morning session, thus requiring regular seated rest breaks. Pt relies on mod and frequent multi modal cuing for resistance exercise due to muscular weakness and motor control but remains highly motivated to participate. Pt will continue to benefit from skilled PT services to progress R knee strength and mobility to optimize return to PLOF.   REHAB POTENTIAL: Excellent  CLINICAL DECISION MAKING: Evolving/moderate complexity  EVALUATION COMPLEXITY: Low   GOALS: Goals reviewed with patient? Yes  SHORT TERM GOALS: Target date: 02/23/2022   Pt will be independent with HEP to improve strength and decrease knee pain to improve pain-free function at home. Baseline:  Goal status: INITIAL   LONG TERM GOALS: Target date: 04/06/2022   Pt will increase FOTO to at least 57 to demonstrate significant improvement in function at home and work related to knee pain  Baseline: 01/12/22: 37, 02/15/22: 57 Goal status: ACHIEVED  2.  Pt will decrease worst knee pain by at least 3 points on the NPRS in order to demonstrate clinically significant reduction in back pain. Baseline: 01/12/22: worst 8/10; 02/16/22: 1/10; Goal status: ACHIEVED  3.  Pt will increase strength  of R quad (perform 10 full SLR without quad lag) in order to demonstrate improvement in strength and function related to R TKR  Baseline: 01/12/22: Able to perform SLR but with notable quad lag; 02/16/22: SLR x 10 without quad lag Goal status: ACHIEVED  4.  Pt will increase R knee AAROM to 0 degrees extension and at least 120 degrees of flexion in order to improve function with gait and stairs. Baseline: 01/12/22: -6 to 97; 02/16/22: -3 TO 122;  Goal status: PARTIALLY MET  5.  Pt will be able to perform sit to  stand from regular height chair without UE assist in order to demonstrate improved function related to increase in BLE strength. Baseline: 01/12/22: Unable to perform sit to stand without heavy UE assist; 02/16/22: Unable to perform from a regular height chair without UE assist but strength improving compared to initial evaluation; Goal status: INITIAL   PLAN: PT FREQUENCY: 2x/week  PT DURATION: 12 weeks  PLANNED INTERVENTIONS: Therapeutic exercises, Therapeutic activity, Neuromuscular re-education, Balance training, Gait training, Patient/Family education, Joint manipulation, Joint mobilization, Canalith repositioning, Aquatic Therapy, Dry Needling, Electrical stimulation, Spinal manipulation, Spinal mobilization, Cryotherapy, Moist heat, Taping, Traction, Ultrasound, Ionotophoresis 61m/ml Dexamethasone, and Manual therapy  PLAN FOR NEXT SESSION:  review/modify HEP as needed, continue strengthening and stretching.  MSalem Caster Fairly IV, PT, DPT Physical Therapist- [facility] 02/27/2022, 2:52 PM

## 2022-03-05 NOTE — Therapy (Signed)
OUTPATIENT PHYSICAL THERAPY KNEE TREATMENT   Patient Name: Charlotte Glover MRN: [id number] DOB:08/11/1927, 88 y.o., female Today's Date: 03/06/2022   PT End of Session - 03/06/22 1024     Visit Number 13    Number of Visits 25    Date for PT Re-Evaluation 04/06/22    Authorization Type eval: 01/12/22    PT Start Time 1016    PT Stop Time 1100    PT Time Calculation (min) 44 min    Activity Tolerance Patient tolerated treatment well    Behavior During Therapy [facility] for tasks assessed/performed             Past Medical History:  Diagnosis Date   (HFpEF) heart failure with preserved ejection fraction ([place]) 06/21/2020   a.) TTE 06/21/2020: EF >55%, RAE, G1DD   Amaurosis fugax of left eye    Arthritis    Atrial fibrillation ([place])    a.) Dx'd in 2009; b.) CHA2DS2-VASc = 6 (age x 2, sex, CHF, HTN, prior MI); c.) s/p PVI 09/31/2013; DCCV (200 J x 1) 04/02/2012, repeat PVI with roof line, coronary sinus isolation, and CFAE ablation 01/02/2013; DCCV 01/18/2013 (200 J x 1); AV nodal ablation 05/28/2014;  d.) rate/rhythm maintained without pharmacological intervention; chronically anticoagulated using rivaroxaban   Cardiac arrest ([place]) 10/2009   a.) witnessed arrest at home; husband performed CPR; ROSC achieved in the field. Admitted for workup --> etiology of CV event undetermined; felt to be secondary to "electrolyte abnormalities".   Cardiac murmur    CHB (complete heart block) ([place])    a.) s/p PPM 05/28/2014; b.) CRT-P upgrade 07/13/2016.   Chickenpox    CKD (chronic kidney disease), stage III (HCC)    Coronary artery disease    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/13/2013   a.) grade II; ER/PR (+)   Dyspnea    Edema    GERD (gastroesophageal reflux disease)    Granulomatous disease ([place])    a.) spleen   Hiatal hernia    HLD (hyperlipidemia)    HOH (hard of hearing)    Hypertension 01/23/2012   [place] esophagus    Long term current use of  antithrombotics/antiplatelets    a.) rivaroxaban   Melanoma of lower leg, left (HCC)    Meningioma, multiple ([place]) 07/14/2019   a.) MRI brain 07/14/2019: 1.2 x 1.3 x 1.3 cm midline posterior fossa meningioma inferior to the cerebellar vertex; 7 x 3 mm dural based meningioma along frontal convexity   NSTEMI (non-ST elevated myocardial infarction) ([place]) 08/2009   Presence of permanent cardiac pacemaker    Pulmonary HTN ([place]) 05/08/2016   a.) R/LHC 05/08/2016: EF >55, LVEDP 15, mean PA 38, AO sat 97, RVSP 50, CO 3 L/min   S/P ablation of atrial fibrillation    Shingles    SSS (sick sinus syndrome) ([place])    a.) CRT-P in place   Valvular heart disease 02/08/2012   a.) cMRI 02/08/2012: EF 50-55, mild-mod AR, mod MR; b.) TEE 02/26/2012: EF >55, mild MR/AR/TR; c.) R/LHC 05/08/2016: sev MR; d.) TEE 06/14/2016: EF >55, sev MR, mod AR/TR; e.) TTE 10/05/2016: EF >55, triv PR, mod AR/MR/TR; f.) TTE 04/22/2018: EF >55, mild MR/TR/PR, mod AR; g.) TTE 11/27/2019: EF 50, mild PR, mod AR/MR/TR; h.) TTE 06/21/2020: EF >55, triv PR, mod AR/MR/TR   Wears hearing aid in both ears    Past Surgical History:  Procedure Laterality Date   ANTERIOR VITRECTOMY Left 03/20/2017   Procedure: ANTERIOR VITRECTOMY;  Surgeon: Leandrew Koyanagi, MD;  Location: [facility];  Service: Ophthalmology;  Laterality: Left;  IVA TOPICAL LEFT   ARTERY BIOPSY Right 04/08/2019   Procedure: BIOPSY TEMPORAL ARTERY;  Surgeon: Algernon Huxley, MD;  Location: [facility];  Service: Vascular;  Laterality: Right;   ATRIAL FIBRILLATION ABLATION N/A 03/24/2012   Procedure: ATRIAL FIBRILLATION ABLATION (PVI); Location: [facility]   ATRIAL FIBRILLATION ABLATION N/A 01/02/2013   Procedure: ATRIAL FIBRILLATION ABLATION (PVI, roof line, coronary sinus siolation, CFAE ablation); Location: [facility]   AV NODE ABLATION N/A 05/26/2014   Procedure: AV NODE ABLATION; Location: [facility]   BIV PACEMAKER INSERTION CRT-P N/A 07/13/2016   Procedure: CRT-P BiV  PACEMAKER UPGRADE; Location: [facility]   BREAST BIOPSY Right 2015   + for DCIS    BREAST SURGERY     CARDIAC CATHETERIZATION Bilateral 05/08/2016   Procedure: Right/Left Heart Cath and Coronary Angiography;  Surgeon: Isaias Cowman, MD;  Location: [facility];  Service: Cardiovascular;  Laterality: Bilateral;   CARDIOVERSION N/A 04/02/2012   Procedure: CARDIOVERSION; Location: [facility]   CARDIOVERSION N/A 01/15/2013   Procedure: CARDIOVERSION; Location: [facility]   CATARACT EXTRACTION W/PHACO Left 03/20/2017   Procedure: IOL EXCHANGE;  Surgeon: Leandrew Koyanagi, MD;  Location: [facility];  Service: Ophthalmology;  Laterality: Left;   CHOLECYSTECTOMY     COLONOSCOPY     patient reports several   DILATION AND CURETTAGE OF UTERUS     ESOPHAGOGASTRODUODENOSCOPY     ESOPHAGOGASTRODUODENOSCOPY (EGD) WITH PROPOFOL N/A 12/13/2020   Procedure: ESOPHAGOGASTRODUODENOSCOPY (EGD) WITH PROPOFOL;  Surgeon: Lesly Rubenstein, MD;  Location: [facility];  Service: Endoscopy;  Laterality: N/A;   KNEE ARTHROPLASTY Right 12/25/2021   Procedure: COMPUTER ASSISTED TOTAL KNEE ARTHROPLASTY;  Surgeon: Dereck Leep, MD;  Location: [facility];  Service: Orthopedics;  Laterality: Right;   PACEMAKER INSERTION N/A 05/28/2014   Procedure: PACEMAKER INSERTION (dual chamber); Location: [facility]   PERIPHERAL IRIDOTOMY Left 03/20/2017   Procedure: PERIPHERAL IRIDECTOMY;  Surgeon: Leandrew Koyanagi, MD;  Location: [facility];  Service: Ophthalmology;  Laterality: Left;   ROTATOR CUFF REPAIR Left    TEE WITHOUT CARDIOVERSION N/A 05/16/2016   Procedure: TRANSESOPHAGEAL ECHOCARDIOGRAM (TEE);  Surgeon: Teodoro Spray, MD;  Location: [facility];  Service: Cardiovascular;  Laterality: N/A;   TONSILLECTOMY     uterine polyp     Patient Active Problem List   Diagnosis Date Noted   Total knee replacement status 12/25/2021   Prediabetes 12/14/2021   Acquired thrombophilia ([place]) 08/07/2021   Primary  osteoarthritis of right knee 04/09/2021   Swelling of limb 11/01/2020   Enlarged pulmonary artery ([place]) 05/23/2020   Moderate aortic insufficiency 03/07/2020   Intracranial carotid stenosis, right 05/29/2019   Chronic headaches 05/29/2019   Long term current use of systemic steroids 05/04/2019   Screening for osteoporosis 05/04/2019   Temporal arteritis ([place]) 04/07/2019   Meningioma ([place]) 12/29/2018   Chronic daily headache 11/20/2018   Elevated erythrocyte sedimentation rate 11/05/2018   CKD (chronic kidney disease) stage 3, GFR 30-59 ml/min ([place]) 12/27/2017   Shingles 05/06/2017   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 04/12/2017   Primary osteoarthritis of right shoulder 12/07/2016   Rotator cuff tear, right 12/07/2016   Rotator cuff tendinitis, right 12/07/2016   CHB (complete heart block) ([place]) 06/30/2016   Severe mitral regurgitation 05/29/2016   Pedal edema 04/23/2016   Lung nodule, multiple 12/28/2015   Amnesia, global, transient 04/05/2015   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 06/30/2014  Biventricular cardiac pacemaker in situ 05/18/2014   Sleep disorder 05/06/2014   Diffuse pain 04/06/2014   Fatigue 03/16/2014   Headache 03/16/2014   History of melanoma 11/24/2013   Melanoma ([place]) 11/24/2013   DCIS (ductal carcinoma in situ) 08/11/2013   History of ductal carcinoma in situ (DCIS) of breast 08/11/2013   H/O non-ST elevation myocardial infarction (NSTEMI) 01/01/2013   Hearing impairment 01/01/2013   S/P ablation of atrial fibrillation 07/11/2012   Edema 01/23/2012   Hypertension 01/23/2012   Atrial fibrillation ([place]) 11/27/2011   Coronary artery disease 11/27/2011    PCP: Adin Hector, MD  REFERRING PROVIDER: Dereck Leep, MD  REFERRING DIAGNOSIS: [phone] (ICD-10-CM) - Presence of right artificial knee joint  THERAPY DIAG: Chronic pain of right knee  Muscle weakness (generalized)  Difficulty  in walking, not elsewhere classified  RATIONALE FOR EVALUATION AND TREATMENT: Rehabilitation  ONSET DATE: 12/25/21  FOLLOW UP APPT WITH PROVIDER: Yes    FROM INITIAL EVALUATION  SUBJECTIVE:                                                                                                                                                                                         Chief Complaint: R Knee replacement 12/25/21  Pertinent History Pt underwent R TKR 12/25/21 due to ongoing chronic pain in her knee. No reported post-operative complications with the exception of some post-operative bleeding. She has been regularly icing, elevating, and wearing compression stockings on the RLE. No compression hose upon arrival today. She had HH PT after the surgery and reports that it went well. She has continued performing her HEP including mini squats and standing hip abduction. Two falls since the surgery but denies any trauma to right knee. Husband and family have been assisting with IADLs since the surgery.  Pain:  Pain Intensity: Present: 0/10, Best: 0/10, Worst: 8/10 Pain location: R Knee Pain quality: aching  Radiating pain: No  Swelling: Yes  Numbness/Tingling: No 24-hour pain behavior: Varies throughout the day History of prior back, hip, or knee injury, pain, surgery, or therapy: No, chronic R knee pain but no prior history of R knee or hip surgery Falls: Has patient fallen in last 6 months? 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  Recent memory is intact.  Remote memory is intact.  Attention span and concentration are intact.  Expressive speech is intact.  Patient's fund of knowledge is within normal limits for educational level.     Gross Musculoskeletal Assessment Tremor: None Bulk: Mild swelling around R knee. Bandage is covering incision however pt indicates that the bandage is wet from her shower yesterday and she doesn't know if she should change the dressing. She does not have any additional dressings at home to use. Tone: Normal   GAIT: Distance walked: 200' with front wheeled walker. Antalgic with decreased stance time on RLE. Decreased self-selected gait speed but functional for limited community mobility Assistive device utilized: Walker - 2 wheeled Level of assistance: Modified independence   Posture: Forward head and rounded shoulders, mild resting R knee flexion  AROM  AROM (Normal range in degrees) AROM  03/06/2022  Lumbar   Flexion (65)   Extension (30)   Right lateral flexion (25)   Left lateral flexion (25)   Right rotation (30)   Left rotation (30)       Hip Right Left  Flexion (125)    Extension (15)    Abduction (40)    Adduction     Internal Rotation (45)    External Rotation (45)        Knee    Flexion (135) 97 135  Extension (0) -6 +2 (hyperextension)      Ankle    Dorsiflexion (20)    Plantarflexion (50)    Inversion (35)    Eversion (15    (* = pain; Blank rows = not tested)   LE MMT:  MMT (out of 5) Right 03/06/2022 Left 03/06/2022  Hip flexion 3+ 3+  Hip extension    Hip abduction    Hip adduction    Hip internal rotation    Hip external rotation     Knee flexion 5 5  Knee extension 5 5  Ankle dorsiflexion 4 4  Ankle plantarflexion Active Active  Ankle inversion    Ankle eversion    (* = pain; Blank rows = not tested)   Sensation Deferred   Reflexes Deferred  Muscle Length Hamstrings: R: Positive for decreased length at 70 degrees L: Not examined  Palpation Mild pain to palpation at medial and lateral knee. Hematoma palpated in R anteromedial thigh just proximal to the knee joint.   Passive Accessory Motion Deferred  VASCULAR Deferred   SPECIAL TESTS Deferred   TODAY'S TREATMENT  SUBJECTIVE: Pt reports she is doing well today. She was gifted a rollator which she has been using and feels like it is helping with her balance/safety. No significant updates reported since the last therapy session. Denies resting pain upon arrival.   PAIN: Denies pain   Ther-ex  NuStep L1-2 BUE/BLE x 5 minutes for warm-up during interval history  Forward BOSU lunges x 10 BLE without UE support; 6" forward step-ups alternating leading LE with BUE support x 10 leading with each leg; Standing R TKE with blue tband resistance x 15; Standing heel raises with BUE support x 15; Sit to stand without UE support from regular height chair with Airex pad on seat x  10, x 5; Squats with BUE support x 10;  Standing exercises with 5# ankle weights (AW): Hip flexion marches 2 x 15 BLE; Hamstring curls x 15 BLE; Hip abduction x 15 BLE; Hip extension x 15 BLE;  Side stepping in // bars with 5# AW x 8 lengths;   PATIENT EDUCATION:  Education details: Pt educated throughout session about proper posture and technique with exercises. Improved exercise technique, movement at target joints, use of target muscles after min to mod verbal, visual, tactile cues.  Person educated: Patient Education method: Explanation and demonstration Education comprehension: verbalized understanding and returned demonstration   HOME EXERCISE PROGRAM: Access  Code: [email or web address] URL: [email or web address] Date: 01/14/2022 Prepared by: Roxana Hires  Exercises - Supine Ankle Pumps  - 2 x daily - 7 x weekly - 2 sets - 10 reps - Supine Quadricep Sets (Mirrored)  - 2 x daily - 7 x weekly - 2 sets - 10 reps - 3s hold - Supine Active Straight Leg Raise  - 2 x daily - 7 x weekly - 2 sets - 10 reps - Supine Heel Slides  - 2 x daily - 7 x weekly - 2 sets - 10 reps - Seated Long Arc Quad  - 2 x daily - 7 x weekly - 2 sets - 10 reps - Mini Squat with Counter Support  - 2 x daily - 7 x weekly - 2 sets - 10 reps - Standing Hip Abduction with Counter Support (Mirrored)  - 2 x daily - 7 x weekly - 2 sets - 10 reps - 3s (perform on both sides) hold - Seated Knee Flexion Stretch  - 2 x daily - 7 x weekly - 3 reps - 60s (progress to longer holds) hold - Seated Passive Knee Extension (Mirrored)  - 2 x daily - 7 x weekly - 3 reps - 60s (progress to longer) hold  Patient Education - Total Knee Replacement Handout   ASSESSMENT:  CLINICAL IMPRESSION: Continued session with focus on LE strengthening and RLE mobility. She arrived ambulating with a rollator and does appear much more stable when using during ambulation. She is demonstrating improved BLE strength with sit to stand from elevated chair and states that she has been able to stand at home without external assistance from family/friends. Overall she remains very pleased with the progress of her RLE. Pt will continue to benefit from skilled PT services to progress R knee strength and mobility to optimize return to PLOF.   REHAB POTENTIAL: Excellent  CLINICAL DECISION MAKING: Evolving/moderate complexity  EVALUATION COMPLEXITY: Low   GOALS: Goals reviewed with patient? Yes  SHORT TERM GOALS: Target date: 02/23/2022   Pt will be independent with HEP to improve strength and decrease knee pain to improve pain-free function at home. Baseline:  Goal status: INITIAL   LONG TERM GOALS: Target date:  04/06/2022   Pt will increase FOTO to at least 57 to demonstrate significant improvement in function at home and work related to knee pain  Baseline: 01/12/22: 37, 02/15/22: 57 Goal status: ACHIEVED  2.  Pt will decrease worst knee pain by at least 3 points on the NPRS in order to demonstrate clinically significant reduction in back pain. Baseline: 01/12/22: worst 8/10; 02/16/22: 1/10; Goal status: ACHIEVED  3.  Pt will increase strength of R quad (perform 10 full SLR without quad lag) in order to demonstrate improvement in strength and function related to R TKR  Baseline: 01/12/22: Able to perform SLR but with notable quad  lag; 02/16/22: SLR x 10 without quad lag Goal status: ACHIEVED  4.  Pt will increase R knee AAROM to 0 degrees extension and at least 120 degrees of flexion in order to improve function with gait and stairs. Baseline: 01/12/22: -6 to 97; 02/16/22: -3 TO 122;  Goal status: PARTIALLY MET  5.  Pt will be able to perform sit to stand from regular height chair without UE assist in order to demonstrate improved function related to increase in BLE strength. Baseline: 01/12/22: Unable to perform sit to stand without heavy UE assist; 02/16/22: Unable to perform from a regular height chair without UE assist but strength improving compared to initial evaluation; Goal status: ONGOING   PLAN: PT FREQUENCY: 2x/week  PT DURATION: 12 weeks  PLANNED INTERVENTIONS: Therapeutic exercises, Therapeutic activity, Neuromuscular re-education, Balance training, Gait training, Patient/Family education, Joint manipulation, Joint mobilization, Canalith repositioning, Aquatic Therapy, Dry Needling, Electrical stimulation, Spinal manipulation, Spinal mobilization, Cryotherapy, Moist heat, Taping, Traction, Ultrasound, Ionotophoresis 4mg /ml Dexamethasone, and Manual therapy  PLAN FOR NEXT SESSION:  review/modify HEP as needed, continue strengthening and stretching.  Phillips Grout PT, DPT, GCS  Physical  Therapist- [facility]  03/06/2022, 12:58 PM

## 2022-03-06 ENCOUNTER — Ambulatory Visit: Payer: Medicare Other | Attending: Orthopedic Surgery

## 2022-03-06 DIAGNOSIS — M6281 Muscle weakness (generalized): Secondary | ICD-10-CM | POA: Diagnosis present

## 2022-03-06 DIAGNOSIS — M25561 Pain in right knee: Secondary | ICD-10-CM | POA: Diagnosis present

## 2022-03-06 DIAGNOSIS — R262 Difficulty in walking, not elsewhere classified: Secondary | ICD-10-CM | POA: Insufficient documentation

## 2022-03-06 DIAGNOSIS — G8929 Other chronic pain: Secondary | ICD-10-CM | POA: Insufficient documentation

## 2022-03-10 NOTE — Therapy (Unsigned)
OUTPATIENT PHYSICAL THERAPY KNEE TREATMENT   Patient Name: Charlotte Glover MRN: [id number] DOB:09/10/1927, 88 y.o., female Today's Date: 03/13/2022   PT End of Session - 03/12/22 1109     Visit Number 14    Number of Visits 25    Date for PT Re-Evaluation 04/06/22    Authorization Type eval: 01/12/22    PT Start Time 1105    PT Stop Time 1145    PT Time Calculation (min) 40 min    Activity Tolerance Patient tolerated treatment well    Behavior During Therapy [facility] for tasks assessed/performed             Past Medical History:  Diagnosis Date   (HFpEF) heart failure with preserved ejection fraction ([place]) 06/21/2020   a.) TTE 06/21/2020: EF >55%, RAE, G1DD   Amaurosis fugax of left eye    Arthritis    Atrial fibrillation ([place])    a.) Dx'd in 2009; b.) CHA2DS2-VASc = 6 (age x 2, sex, CHF, HTN, prior MI); c.) s/p PVI 09/31/2013; DCCV (200 J x 1) 04/02/2012, repeat PVI with roof line, coronary sinus isolation, and CFAE ablation 01/02/2013; DCCV 01/18/2013 (200 J x 1); AV nodal ablation 05/28/2014;  d.) rate/rhythm maintained without pharmacological intervention; chronically anticoagulated using rivaroxaban   Cardiac arrest ([place]) 10/2009   a.) witnessed arrest at home; husband performed CPR; ROSC achieved in the field. Admitted for workup --> etiology of CV event undetermined; felt to be secondary to "electrolyte abnormalities".   Cardiac murmur    CHB (complete heart block) ([place])    a.) s/p PPM 05/28/2014; b.) CRT-P upgrade 07/13/2016.   Chickenpox    CKD (chronic kidney disease), stage III (HCC)    Coronary artery disease    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/13/2013   a.) grade II; ER/PR (+)   Dyspnea    Edema    GERD (gastroesophageal reflux disease)    Granulomatous disease ([place])    a.) spleen   Hiatal hernia    HLD (hyperlipidemia)    HOH (hard of hearing)    Hypertension 01/23/2012   [place] esophagus    Long term current use of  antithrombotics/antiplatelets    a.) rivaroxaban   Melanoma of lower leg, left (HCC)    Meningioma, multiple ([place]) 07/14/2019   a.) MRI brain 07/14/2019: 1.2 x 1.3 x 1.3 cm midline posterior fossa meningioma inferior to the cerebellar vertex; 7 x 3 mm dural based meningioma along frontal convexity   NSTEMI (non-ST elevated myocardial infarction) ([place]) 08/2009   Presence of permanent cardiac pacemaker    Pulmonary HTN ([place]) 05/08/2016   a.) R/LHC 05/08/2016: EF >55, LVEDP 15, mean PA 38, AO sat 97, RVSP 50, CO 3 L/min   S/P ablation of atrial fibrillation    Shingles    SSS (sick sinus syndrome) ([place])    a.) CRT-P in place   Valvular heart disease 02/08/2012   a.) cMRI 02/08/2012: EF 50-55, mild-mod AR, mod MR; b.) TEE 02/26/2012: EF >55, mild MR/AR/TR; c.) R/LHC 05/08/2016: sev MR; d.) TEE 06/14/2016: EF >55, sev MR, mod AR/TR; e.) TTE 10/05/2016: EF >55, triv PR, mod AR/MR/TR; f.) TTE 04/22/2018: EF >55, mild MR/TR/PR, mod AR; g.) TTE 11/27/2019: EF 50, mild PR, mod AR/MR/TR; h.) TTE 06/21/2020: EF >55, triv PR, mod AR/MR/TR   Wears hearing aid in both ears    Past Surgical History:  Procedure Laterality Date   ANTERIOR VITRECTOMY Left 03/20/2017   Procedure: ANTERIOR VITRECTOMY;  Surgeon: Leandrew Koyanagi, MD;  Location: [facility];  Service: Ophthalmology;  Laterality: Left;  IVA TOPICAL LEFT   ARTERY BIOPSY Right 04/08/2019   Procedure: BIOPSY TEMPORAL ARTERY;  Surgeon: Algernon Huxley, MD;  Location: [facility];  Service: Vascular;  Laterality: Right;   ATRIAL FIBRILLATION ABLATION N/A 03/24/2012   Procedure: ATRIAL FIBRILLATION ABLATION (PVI); Location: [facility]   ATRIAL FIBRILLATION ABLATION N/A 01/02/2013   Procedure: ATRIAL FIBRILLATION ABLATION (PVI, roof line, coronary sinus siolation, CFAE ablation); Location: [facility]   AV NODE ABLATION N/A 05/26/2014   Procedure: AV NODE ABLATION; Location: [facility]   BIV PACEMAKER INSERTION CRT-P N/A 07/13/2016   Procedure: CRT-P BiV  PACEMAKER UPGRADE; Location: [facility]   BREAST BIOPSY Right 2015   + for DCIS    BREAST SURGERY     CARDIAC CATHETERIZATION Bilateral 05/08/2016   Procedure: Right/Left Heart Cath and Coronary Angiography;  Surgeon: Isaias Cowman, MD;  Location: [facility];  Service: Cardiovascular;  Laterality: Bilateral;   CARDIOVERSION N/A 04/02/2012   Procedure: CARDIOVERSION; Location: [facility]   CARDIOVERSION N/A 01/15/2013   Procedure: CARDIOVERSION; Location: [facility]   CATARACT EXTRACTION W/PHACO Left 03/20/2017   Procedure: IOL EXCHANGE;  Surgeon: Leandrew Koyanagi, MD;  Location: [facility];  Service: Ophthalmology;  Laterality: Left;   CHOLECYSTECTOMY     COLONOSCOPY     patient reports several   DILATION AND CURETTAGE OF UTERUS     ESOPHAGOGASTRODUODENOSCOPY     ESOPHAGOGASTRODUODENOSCOPY (EGD) WITH PROPOFOL N/A 12/13/2020   Procedure: ESOPHAGOGASTRODUODENOSCOPY (EGD) WITH PROPOFOL;  Surgeon: Lesly Rubenstein, MD;  Location: [facility];  Service: Endoscopy;  Laterality: N/A;   KNEE ARTHROPLASTY Right 12/25/2021   Procedure: COMPUTER ASSISTED TOTAL KNEE ARTHROPLASTY;  Surgeon: Dereck Leep, MD;  Location: [facility];  Service: Orthopedics;  Laterality: Right;   PACEMAKER INSERTION N/A 05/28/2014   Procedure: PACEMAKER INSERTION (dual chamber); Location: [facility]   PERIPHERAL IRIDOTOMY Left 03/20/2017   Procedure: PERIPHERAL IRIDECTOMY;  Surgeon: Leandrew Koyanagi, MD;  Location: [facility];  Service: Ophthalmology;  Laterality: Left;   ROTATOR CUFF REPAIR Left    TEE WITHOUT CARDIOVERSION N/A 05/16/2016   Procedure: TRANSESOPHAGEAL ECHOCARDIOGRAM (TEE);  Surgeon: Teodoro Spray, MD;  Location: [facility];  Service: Cardiovascular;  Laterality: N/A;   TONSILLECTOMY     uterine polyp     Patient Active Problem List   Diagnosis Date Noted   Total knee replacement status 12/25/2021   Prediabetes 12/14/2021   Acquired thrombophilia ([place]) 08/07/2021   Primary  osteoarthritis of right knee 04/09/2021   Swelling of limb 11/01/2020   Enlarged pulmonary artery ([place]) 05/23/2020   Moderate aortic insufficiency 03/07/2020   Intracranial carotid stenosis, right 05/29/2019   Chronic headaches 05/29/2019   Long term current use of systemic steroids 05/04/2019   Screening for osteoporosis 05/04/2019   Temporal arteritis ([place]) 04/07/2019   Meningioma ([place]) 12/29/2018   Chronic daily headache 11/20/2018   Elevated erythrocyte sedimentation rate 11/05/2018   CKD (chronic kidney disease) stage 3, GFR 30-59 ml/min ([place]) 12/27/2017   Shingles 05/06/2017   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 04/12/2017   Primary osteoarthritis of right shoulder 12/07/2016   Rotator cuff tear, right 12/07/2016   Rotator cuff tendinitis, right 12/07/2016   CHB (complete heart block) ([place]) 06/30/2016   Severe mitral regurgitation 05/29/2016   Pedal edema 04/23/2016   Lung nodule, multiple 12/28/2015   Amnesia, global, transient 04/05/2015   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 06/30/2014  Biventricular cardiac pacemaker in situ 05/18/2014   Sleep disorder 05/06/2014   Diffuse pain 04/06/2014   Fatigue 03/16/2014   Headache 03/16/2014   History of melanoma 11/24/2013   Melanoma ([place]) 11/24/2013   DCIS (ductal carcinoma in situ) 08/11/2013   History of ductal carcinoma in situ (DCIS) of breast 08/11/2013   H/O non-ST elevation myocardial infarction (NSTEMI) 01/01/2013   Hearing impairment 01/01/2013   S/P ablation of atrial fibrillation 07/11/2012   Edema 01/23/2012   Hypertension 01/23/2012   Atrial fibrillation ([place]) 11/27/2011   Coronary artery disease 11/27/2011    PCP: Adin Hector, MD  REFERRING PROVIDER: Dereck Leep, MD  REFERRING DIAGNOSIS: [phone] (ICD-10-CM) - Presence of right artificial knee joint  THERAPY DIAG: Chronic pain of right knee  Muscle weakness (generalized)  Difficulty  in walking, not elsewhere classified  RATIONALE FOR EVALUATION AND TREATMENT: Rehabilitation  ONSET DATE: 12/25/21  FOLLOW UP APPT WITH PROVIDER: Yes    FROM INITIAL EVALUATION  SUBJECTIVE:                                                                                                                                                                                         Chief Complaint: R Knee replacement 12/25/21  Pertinent History Pt underwent R TKR 12/25/21 due to ongoing chronic pain in her knee. No reported post-operative complications with the exception of some post-operative bleeding. She has been regularly icing, elevating, and wearing compression stockings on the RLE. No compression hose upon arrival today. She had HH PT after the surgery and reports that it went well. She has continued performing her HEP including mini squats and standing hip abduction. Two falls since the surgery but denies any trauma to right knee. Husband and family have been assisting with IADLs since the surgery.  Pain:  Pain Intensity: Present: 0/10, Best: 0/10, Worst: 8/10 Pain location: R Knee Pain quality: aching  Radiating pain: No  Swelling: Yes  Numbness/Tingling: No 24-hour pain behavior: Varies throughout the day History of prior back, hip, or knee injury, pain, surgery, or therapy: No, chronic R knee pain but no prior history of R knee or hip surgery Falls: Has patient fallen in last 6 months? 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  Recent memory is intact.  Remote memory is intact.  Attention span and concentration are intact.  Expressive speech is intact.  Patient's fund of knowledge is within normal limits for educational level.     Gross Musculoskeletal Assessment Tremor: None Bulk: Mild swelling around R knee. Bandage is covering incision however pt indicates that the bandage is wet from her shower yesterday and she doesn't know if she should change the dressing. She does not have any additional dressings at home to use. Tone: Normal   GAIT: Distance walked: 200' with front wheeled walker. Antalgic with decreased stance time on RLE. Decreased self-selected gait speed but functional for limited community mobility Assistive device utilized: Walker - 2 wheeled Level of assistance: Modified independence   Posture: Forward head and rounded shoulders, mild resting R knee flexion  AROM  AROM (Normal range in degrees) AROM  03/13/2022  Lumbar   Flexion (65)   Extension (30)   Right lateral flexion (25)   Left lateral flexion (25)   Right rotation (30)   Left rotation (30)       Hip Right Left  Flexion (125)    Extension (15)    Abduction (40)    Adduction     Internal Rotation (45)    External Rotation (45)        Knee    Flexion (135) 97 135  Extension (0) -6 +2 (hyperextension)      Ankle    Dorsiflexion (20)    Plantarflexion (50)    Inversion (35)    Eversion (15    (* = pain; Blank rows = not tested)   LE MMT:  MMT (out of 5) Right 03/13/2022 Left 03/13/2022  Hip flexion 3+ 3+  Hip extension    Hip abduction    Hip adduction    Hip internal rotation    Hip external rotation     Knee flexion 5 5  Knee extension 5 5  Ankle dorsiflexion 4 4  Ankle plantarflexion Active Active  Ankle inversion    Ankle eversion    (* = pain; Blank rows = not tested)   Sensation Deferred   Reflexes Deferred  Muscle Length Hamstrings: R: Positive for decreased length at 70 degrees L: Not examined  Palpation Mild pain to palpation at medial and lateral knee. Hematoma palpated in R anteromedial thigh just proximal to the knee joint.   Passive Accessory Motion Deferred  VASCULAR Deferred   SPECIAL TESTS Deferred   TODAY'S TREATMENT  SUBJECTIVE: Pt reports she is doing well today. She arrives ambulating with a single point cane. No significant updates reported since the last therapy session. Denies resting pain upon arrival.   PAIN: Denies pain   Ther-ex  NuStep L1-2 BUE/BLE x 5 minutes for warm-up during interval history  Squats with BUE support x 10; Forward lunges with R foot forward x 10 with UE support; 6" forward step-ups leading with RLE with BUE support x 10 leading with each leg; Standing R TKE with blue tband resistance x 15; Standing heel raises with BUE support x 15; Sit to stand without UE support from regular height chair with Airex pad on seat 2 x 7;  Standing exercises with 5# ankle weights (AW): Hip flexion marches x 20 BLE; Hamstring curls x 20 BLE; Hip abduction x 20 BLE;   Manual Therapy  Supine R knee extension stretch with heel on towel roll and therapist providing gentle STM to R posterior knee/hamstrings; Supine R patella superior mobilizations grade II-III, 3 x 30s; Supine R tibia on femur PA mobilizations at available end range extension to improve extension, grade I-II, 30s/bout x 3 bouts; Supine R tibia external rotation mobilizations at available end range to improve extension, grade I-II, 30s/bout x 2 bouts;  R knee AAROM: -3 -124 degrees   PATIENT EDUCATION:  Education details: Pt educated throughout session about  proper posture and technique with exercises. Improved exercise technique, movement at target joints, use of target muscles after min to mod verbal, visual, tactile cues.  Person educated: Patient Education method: Explanation and demonstration Education comprehension: verbalized understanding and returned demonstration   HOME EXERCISE PROGRAM: Access Code: [email or web address] URL: [email or web address] Date: 01/14/2022 Prepared by: Roxana Hires  Exercises - Supine Ankle Pumps  - 2 x daily - 7 x weekly - 2 sets - 10 reps - Supine Quadricep Sets (Mirrored)  - 2 x daily - 7 x weekly - 2 sets - 10 reps - 3s hold - Supine Active Straight Leg Raise  - 2 x daily - 7 x weekly - 2 sets - 10 reps - Supine Heel Slides  - 2 x daily - 7 x weekly - 2 sets - 10 reps - Seated Long Arc Quad  - 2 x daily - 7 x weekly - 2 sets - 10 reps - Mini Squat with Counter Support  - 2 x daily - 7 x weekly - 2 sets - 10 reps - Standing Hip Abduction with Counter Support (Mirrored)  - 2 x daily - 7 x weekly - 2 sets - 10 reps - 3s (perform on both sides) hold - Seated Knee Flexion Stretch  - 2 x daily - 7 x weekly - 3 reps - 60s (progress to longer holds) hold - Seated Passive Knee Extension (Mirrored)  - 2 x daily - 7 x weekly - 3 reps - 60s (progress to longer) hold  Patient Education - Total Knee Replacement Handout   ASSESSMENT:  CLINICAL IMPRESSION: Continued session with focus on LE strengthening and RLE mobility. She arrived ambulating with a single point cane. She continues demonstrating improved BLE strength with sit to stand from elevated chair. Reintroduced manual techniques to continue working on RLE range of motion. AAROM at end of session is -3 to 124 degrees. Encouragement provided to patient during gait today to improve RLE heel strike. She continues to frequently ambulate with R flat foot strike at initial contact. Pt is making excellent progress. She will continue to benefit from skilled PT  services to progress R knee strength and mobility to optimize return to PLOF.   REHAB POTENTIAL: Excellent  CLINICAL DECISION MAKING: Evolving/moderate complexity  EVALUATION COMPLEXITY: Low   GOALS: Goals reviewed with patient? Yes  SHORT TERM GOALS: Target date: 02/23/2022   Pt will be independent with HEP to improve strength and decrease knee pain to improve pain-free function at home. Baseline:  Goal status: INITIAL   LONG TERM GOALS: Target date: 04/06/2022   Pt will increase FOTO to at least 57 to demonstrate significant improvement in function at home and work related to knee pain  Baseline: 01/12/22: 37, 02/15/22: 57 Goal status: ACHIEVED  2.  Pt will decrease worst knee  pain by at least 3 points on the NPRS in order to demonstrate clinically significant reduction in back pain. Baseline: 01/12/22: worst 8/10; 02/16/22: 1/10; Goal status: ACHIEVED  3.  Pt will increase strength of R quad (perform 10 full SLR without quad lag) in order to demonstrate improvement in strength and function related to R TKR  Baseline: 01/12/22: Able to perform SLR but with notable quad lag; 02/16/22: SLR x 10 without quad lag Goal status: ACHIEVED  4.  Pt will increase R knee AAROM to 0 degrees extension and at least 120 degrees of flexion in order to improve function with gait and stairs. Baseline: 01/12/22: -6 to 97; 02/16/22: -3 TO 122;  Goal status: PARTIALLY MET  5.  Pt will be able to perform sit to stand from regular height chair without UE assist in order to demonstrate improved function related to increase in BLE strength. Baseline: 01/12/22: Unable to perform sit to stand without heavy UE assist; 02/16/22: Unable to perform from a regular height chair without UE assist but strength improving compared to initial evaluation; Goal status: ONGOING   PLAN: PT FREQUENCY: 2x/week  PT DURATION: 12 weeks  PLANNED INTERVENTIONS: Therapeutic exercises, Therapeutic activity, Neuromuscular  re-education, Balance training, Gait training, Patient/Family education, Joint manipulation, Joint mobilization, Canalith repositioning, Aquatic Therapy, Dry Needling, Electrical stimulation, Spinal manipulation, Spinal mobilization, Cryotherapy, Moist heat, Taping, Traction, Ultrasound, Ionotophoresis 39m/ml Dexamethasone, and Manual therapy  PLAN FOR NEXT SESSION:  review/modify HEP as needed, continue strengthening and stretching.  JPhillips GroutPT, DPT, GCS  Physical Therapist- [facility] 03/13/2022, 8:43 AM

## 2022-03-12 ENCOUNTER — Ambulatory Visit: Payer: Medicare Other

## 2022-03-12 DIAGNOSIS — M25561 Pain in right knee: Secondary | ICD-10-CM | POA: Diagnosis not present

## 2022-03-12 DIAGNOSIS — G8929 Other chronic pain: Secondary | ICD-10-CM

## 2022-03-12 DIAGNOSIS — R262 Difficulty in walking, not elsewhere classified: Secondary | ICD-10-CM

## 2022-03-12 DIAGNOSIS — M6281 Muscle weakness (generalized): Secondary | ICD-10-CM

## 2022-03-15 NOTE — Therapy (Signed)
OUTPATIENT PHYSICAL THERAPY KNEE TREATMENT   Patient Name: Charlotte Glover MRN: [id number] DOB:[birth year], 88 y.o., female Today's Date: 03/16/2022   PT End of Session - 03/16/22 1034     Visit Number 15    Number of Visits 25    Date for PT Re-Evaluation 04/06/22    Authorization Type eval: 01/12/22    PT Start Time 1010    PT Stop Time 1100    PT Time Calculation (min) 50 min    Activity Tolerance Patient tolerated treatment well    Behavior During Therapy [facility] for tasks assessed/performed              Past Medical History:  Diagnosis Date   (HFpEF) heart failure with preserved ejection fraction ([place]) 06/21/2020   a.) TTE 06/21/2020: EF >55%, RAE, G1DD   Amaurosis fugax of left eye    Arthritis    Atrial fibrillation ([place])    a.) Dx'd in 2009; b.) CHA2DS2-VASc = 6 (age x 2, sex, CHF, HTN, prior MI); c.) s/p PVI 09/31/2013; DCCV (200 J x 1) 04/02/2012, repeat PVI with roof line, coronary sinus isolation, and CFAE ablation 01/02/2013; DCCV 01/18/2013 (200 J x 1); AV nodal ablation 05/28/2014;  d.) rate/rhythm maintained without pharmacological intervention; chronically anticoagulated using rivaroxaban   Cardiac arrest ([place]) 10/2009   a.) witnessed arrest at home; husband performed CPR; ROSC achieved in the field. Admitted for workup --> etiology of CV event undetermined; felt to be secondary to "electrolyte abnormalities".   Cardiac murmur    CHB (complete heart block) ([place])    a.) s/p PPM 05/28/2014; b.) CRT-P upgrade 07/13/2016.   Chickenpox    CKD (chronic kidney disease), stage III (HCC)    Coronary artery disease    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/13/2013   a.) grade II; ER/PR (+)   Dyspnea    Edema    GERD (gastroesophageal reflux disease)    Granulomatous disease ([place])    a.) spleen   Hiatal hernia    HLD (hyperlipidemia)    HOH (hard of hearing)    Hypertension 01/23/2012   [place] esophagus    Long term current use of  antithrombotics/antiplatelets    a.) rivaroxaban   Melanoma of lower leg, left (HCC)    Meningioma, multiple ([place]) 07/14/2019   a.) MRI brain 07/14/2019: 1.2 x 1.3 x 1.3 cm midline posterior fossa meningioma inferior to the cerebellar vertex; 7 x 3 mm dural based meningioma along frontal convexity   NSTEMI (non-ST elevated myocardial infarction) ([place]) 08/2009   Presence of permanent cardiac pacemaker    Pulmonary HTN ([place]) 05/08/2016   a.) R/LHC 05/08/2016: EF >55, LVEDP 15, mean PA 38, AO sat 97, RVSP 50, CO 3 L/min   S/P ablation of atrial fibrillation    Shingles    SSS (sick sinus syndrome) ([place])    a.) CRT-P in place   Valvular heart disease 02/08/2012   a.) cMRI 02/08/2012: EF 50-55, mild-mod AR, mod MR; b.) TEE 02/26/2012: EF >55, mild MR/AR/TR; c.) R/LHC 05/08/2016: sev MR; d.) TEE 06/14/2016: EF >55, sev MR, mod AR/TR; e.) TTE 10/05/2016: EF >55, triv PR, mod AR/MR/TR; f.) TTE 04/22/2018: EF >55, mild MR/TR/PR, mod AR; g.) TTE 11/27/2019: EF 50, mild PR, mod AR/MR/TR; h.) TTE 06/21/2020: EF >55, triv PR, mod AR/MR/TR   Wears hearing aid in both ears    Past Surgical History:  Procedure Laterality Date   ANTERIOR VITRECTOMY Left 03/20/2017   Procedure: ANTERIOR VITRECTOMY;  Surgeon: Leandrew Koyanagi, MD;  Location: [facility];  Service: Ophthalmology;  Laterality: Left;  IVA TOPICAL LEFT   ARTERY BIOPSY Right 04/08/2019   Procedure: BIOPSY TEMPORAL ARTERY;  Surgeon: Algernon Huxley, MD;  Location: [facility];  Service: Vascular;  Laterality: Right;   ATRIAL FIBRILLATION ABLATION N/A 03/24/2012   Procedure: ATRIAL FIBRILLATION ABLATION (PVI); Location: [facility]   ATRIAL FIBRILLATION ABLATION N/A 01/02/2013   Procedure: ATRIAL FIBRILLATION ABLATION (PVI, roof line, coronary sinus siolation, CFAE ablation); Location: [facility]   AV NODE ABLATION N/A 05/26/2014   Procedure: AV NODE ABLATION; Location: [facility]   BIV PACEMAKER INSERTION CRT-P N/A 07/13/2016   Procedure: CRT-P BiV  PACEMAKER UPGRADE; Location: [facility]   BREAST BIOPSY Right 2015   + for DCIS    BREAST SURGERY     CARDIAC CATHETERIZATION Bilateral 05/08/2016   Procedure: Right/Left Heart Cath and Coronary Angiography;  Surgeon: Isaias Cowman, MD;  Location: [facility];  Service: Cardiovascular;  Laterality: Bilateral;   CARDIOVERSION N/A 04/02/2012   Procedure: CARDIOVERSION; Location: [facility]   CARDIOVERSION N/A 01/15/2013   Procedure: CARDIOVERSION; Location: [facility]   CATARACT EXTRACTION W/PHACO Left 03/20/2017   Procedure: IOL EXCHANGE;  Surgeon: Leandrew Koyanagi, MD;  Location: [facility];  Service: Ophthalmology;  Laterality: Left;   CHOLECYSTECTOMY     COLONOSCOPY     patient reports several   DILATION AND CURETTAGE OF UTERUS     ESOPHAGOGASTRODUODENOSCOPY     ESOPHAGOGASTRODUODENOSCOPY (EGD) WITH PROPOFOL N/A 12/13/2020   Procedure: ESOPHAGOGASTRODUODENOSCOPY (EGD) WITH PROPOFOL;  Surgeon: Lesly Rubenstein, MD;  Location: [facility];  Service: Endoscopy;  Laterality: N/A;   KNEE ARTHROPLASTY Right 12/25/2021   Procedure: COMPUTER ASSISTED TOTAL KNEE ARTHROPLASTY;  Surgeon: Dereck Leep, MD;  Location: [facility];  Service: Orthopedics;  Laterality: Right;   PACEMAKER INSERTION N/A 05/28/2014   Procedure: PACEMAKER INSERTION (dual chamber); Location: [facility]   PERIPHERAL IRIDOTOMY Left 03/20/2017   Procedure: PERIPHERAL IRIDECTOMY;  Surgeon: Leandrew Koyanagi, MD;  Location: [facility];  Service: Ophthalmology;  Laterality: Left;   ROTATOR CUFF REPAIR Left    TEE WITHOUT CARDIOVERSION N/A 05/16/2016   Procedure: TRANSESOPHAGEAL ECHOCARDIOGRAM (TEE);  Surgeon: Teodoro Spray, MD;  Location: [facility];  Service: Cardiovascular;  Laterality: N/A;   TONSILLECTOMY     uterine polyp     Patient Active Problem List   Diagnosis Date Noted   Total knee replacement status 12/25/2021   Prediabetes 12/14/2021   Acquired thrombophilia ([place]) 08/07/2021   Primary  osteoarthritis of right knee 04/09/2021   Swelling of limb 11/01/2020   Enlarged pulmonary artery ([place]) 05/23/2020   Moderate aortic insufficiency 03/07/2020   Intracranial carotid stenosis, right 05/29/2019   Chronic headaches 05/29/2019   Long term current use of systemic steroids 05/04/2019   Screening for osteoporosis 05/04/2019   Temporal arteritis ([place]) 04/07/2019   Meningioma ([place]) 12/29/2018   Chronic daily headache 11/20/2018   Elevated erythrocyte sedimentation rate 11/05/2018   CKD (chronic kidney disease) stage 3, GFR 30-59 ml/min ([place]) 12/27/2017   Shingles 05/06/2017   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 04/12/2017   Primary osteoarthritis of right shoulder 12/07/2016   Rotator cuff tear, right 12/07/2016   Rotator cuff tendinitis, right 12/07/2016   CHB (complete heart block) ([place]) 06/30/2016   Severe mitral regurgitation 05/29/2016   Pedal edema 04/23/2016   Lung nodule, multiple 12/28/2015   Amnesia, global, transient 04/05/2015   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 06/30/2014  Biventricular cardiac pacemaker in situ 05/18/2014   Sleep disorder 05/06/2014   Diffuse pain 04/06/2014   Fatigue 03/16/2014   Headache 03/16/2014   History of melanoma 11/24/2013   Melanoma ([place]) 11/24/2013   DCIS (ductal carcinoma in situ) 08/11/2013   History of ductal carcinoma in situ (DCIS) of breast 08/11/2013   H/O non-ST elevation myocardial infarction (NSTEMI) 01/01/2013   Hearing impairment 01/01/2013   S/P ablation of atrial fibrillation 07/11/2012   Edema 01/23/2012   Hypertension 01/23/2012   Atrial fibrillation ([place]) 11/27/2011   Coronary artery disease 11/27/2011    PCP: Adin Hector, MD  REFERRING PROVIDER: Dereck Leep, MD  REFERRING DIAGNOSIS: [id number] (ICD-10-CM) - Presence of right artificial knee joint  THERAPY DIAG: Chronic pain of right knee  Muscle weakness (generalized)  Difficulty  in walking, not elsewhere classified  RATIONALE FOR EVALUATION AND TREATMENT: Rehabilitation  ONSET DATE: 12/25/21  FOLLOW UP APPT WITH PROVIDER: Yes    FROM INITIAL EVALUATION  SUBJECTIVE:                                                                                                                                                                                         Chief Complaint: R Knee replacement 12/25/21  Pertinent History Pt underwent R TKR 12/25/21 due to ongoing chronic pain in her knee. No reported post-operative complications with the exception of some post-operative bleeding. She has been regularly icing, elevating, and wearing compression stockings on the RLE. No compression hose upon arrival today. She had HH PT after the surgery and reports that it went well. She has continued performing her HEP including mini squats and standing hip abduction. Two falls since the surgery but denies any trauma to right knee. Husband and family have been assisting with IADLs since the surgery.  Pain:  Pain Intensity: Present: 0/10, Best: 0/10, Worst: 8/10 Pain location: R Knee Pain quality: aching  Radiating pain: No  Swelling: Yes  Numbness/Tingling: No 24-hour pain behavior: Varies throughout the day History of prior back, hip, or knee injury, pain, surgery, or therapy: No, chronic R knee pain but no prior history of R knee or hip surgery Falls: Has patient fallen in last 6 months? 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  Recent memory is intact.  Remote memory is intact.  Attention span and concentration are intact.  Expressive speech is intact.  Patient's fund of knowledge is within normal limits for educational level.     Gross Musculoskeletal Assessment Tremor: None Bulk: Mild swelling around R knee. Bandage is covering incision however pt indicates that the bandage is wet from her shower yesterday and she doesn't know if she should change the dressing. She does not have any additional dressings at home to use. Tone: Normal   GAIT: Distance walked: 200' with front wheeled walker. Antalgic with decreased stance time on RLE. Decreased self-selected gait speed but functional for limited community mobility Assistive device utilized: Walker - 2 wheeled Level of assistance: Modified independence   Posture: Forward head and rounded shoulders, mild resting R knee flexion  AROM  AROM (Normal range in degrees) AROM  03/16/2022  Lumbar   Flexion (65)   Extension (30)   Right lateral flexion (25)   Left lateral flexion (25)   Right rotation (30)   Left rotation (30)       Hip Right Left  Flexion (125)    Extension (15)    Abduction (40)    Adduction     Internal Rotation (45)    External Rotation (45)        Knee    Flexion (135) 97 135  Extension (0) -6 +2 (hyperextension)      Ankle    Dorsiflexion (20)    Plantarflexion (50)    Inversion (35)    Eversion (15    (* = pain; Blank rows = not tested)   LE MMT:  MMT (out of 5) Right 03/16/2022 Left 03/16/2022  Hip flexion 3+ 3+  Hip extension    Hip abduction    Hip adduction    Hip internal rotation    Hip external rotation     Knee flexion 5 5  Knee extension 5 5  Ankle dorsiflexion 4 4  Ankle plantarflexion Active Active  Ankle inversion    Ankle eversion    (* = pain; Blank rows = not tested)  Sensation Deferred  Reflexes Deferred  Muscle Length Hamstrings: R: Positive for decreased length at 70 degrees L: Not examined  Palpation Mild pain to palpation at medial and lateral knee. Hematoma palpated in R anteromedial thigh just proximal to the knee joint.   Passive Accessory Motion Deferred  VASCULAR Deferred   SPECIAL TESTS Deferred   TODAY'S TREATMENT  SUBJECTIVE: Pt reports she is doing well today. She arrives ambulating with a single point cane. No significant updates reported since the last therapy session. Denies falls or resting pain upon arrival.   PAIN: Denies pain   Ther-ex  NuStep L1-2 BUE/BLE x 10 minutes for warm-up during interval history  Squats with BUE support x 15; Standing R TKE with black tband resistance x 20; Standing heel raises with BUE support x 20; Forward lunges with R foot on second step with BUE support x 15; 6" forward step-ups alternating leading LE with BUE support x 10;   Neuromuscular Re-education  Obstacle course in // bars with 6" hurdles Forward gait across grass without assistive device x  70'; Retro gait across grass without assistive device x 30,' very challenging for patient; Lateral stepping in the grass x 30' each direction; Alternating 6" step taps without UE support x 10 BLE;   Not performed: Supine R knee extension stretch with heel on towel roll and therapist providing gentle STM to R posterior knee/hamstrings; Supine R patella superior mobilizations grade II-III, 3 x 30s; Supine R tibia on femur PA mobilizations at available end range extension to improve extension, grade I-II, 30s/bout x 3 bouts; Supine R tibia external rotation mobilizations at available end range to improve extension, grade I-II, 30s/bout x 2 bouts; Sit to  stand without UE support from regular height chair with Airex pad on seat 2 x 7; Standing exercises with 5# ankle weights (AW): Hip flexion marches x 20 BLE; Hamstring curls x 20 BLE; Hip abduction x 20 BLE;   PATIENT EDUCATION:  Education details: Pt educated throughout session about proper posture and technique with exercises. Improved exercise technique, movement at target joints, use of target muscles after min to mod verbal, visual, tactile cues.  Person educated: Patient Education method: Explanation and demonstration Education comprehension: verbalized understanding and returned demonstration   HOME EXERCISE PROGRAM: Access Code: [email or web address] URL: [email or web address] Date: 01/14/2022 Prepared by: Roxana Hires  Exercises - Supine Ankle Pumps  - 2 x daily - 7 x weekly - 2 sets - 10 reps - Supine Quadricep Sets (Mirrored)  - 2 x daily - 7 x weekly - 2 sets - 10 reps - 3s hold - Supine Active Straight Leg Raise  - 2 x daily - 7 x weekly - 2 sets - 10 reps - Supine Heel Slides  - 2 x daily - 7 x weekly - 2 sets - 10 reps - Seated Long Arc Quad  - 2 x daily - 7 x weekly - 2 sets - 10 reps - Mini Squat with Counter Support  - 2 x daily - 7 x weekly - 2 sets - 10 reps - Standing Hip Abduction with Counter Support (Mirrored)  - 2 x daily - 7 x weekly - 2 sets - 10 reps - 3s (perform on both sides) hold - Seated Knee Flexion Stretch  - 2 x daily - 7 x weekly - 3 reps - 60s (progress to longer holds) hold - Seated Passive Knee Extension (Mirrored)  - 2 x daily - 7 x weekly - 3 reps - 60s (progress to longer) hold  Patient Education - Total Knee Replacement Handout   ASSESSMENT:  CLINICAL IMPRESSION: Continued strengthening today however additional time spent today working on dynamic stability/balance. Performed gait outside today across the uneven grass. Pt struggles considerably with backward walking and report a high fear of falling. She continues demonstrating  improved RLE strength and is making excellent progress. Her gait is improving with improving R heel strike. She will continue to benefit from skilled PT services to progress R knee strength and mobility to optimize return to PLOF.   REHAB POTENTIAL: Excellent  CLINICAL DECISION MAKING: Evolving/moderate complexity  EVALUATION COMPLEXITY: Low   GOALS: Goals reviewed with patient? Yes  SHORT TERM GOALS: Target date: 02/23/2022   Pt will be independent with HEP to improve strength and decrease knee pain to improve pain-free function at home. Baseline:  Goal status: INITIAL   LONG TERM GOALS: Target date: 04/06/2022   Pt will increase FOTO to at least 57 to demonstrate significant improvement in function at home and work related to knee pain  Baseline: 01/12/22:  37, 02/15/22: 57 Goal status: ACHIEVED  2.  Pt will decrease worst knee pain by at least 3 points on the NPRS in order to demonstrate clinically significant reduction in back pain. Baseline: 01/12/22: worst 8/10; 02/16/22: 1/10; Goal status: ACHIEVED  3.  Pt will increase strength of R quad (perform 10 full SLR without quad lag) in order to demonstrate improvement in strength and function related to R TKR  Baseline: 01/12/22: Able to perform SLR but with notable quad lag; 02/16/22: SLR x 10 without quad lag Goal status: ACHIEVED  4.  Pt will increase R knee AAROM to 0 degrees extension and at least 120 degrees of flexion in order to improve function with gait and stairs. Baseline: 01/12/22: -6 to 97; 02/16/22: -3 TO 122;  Goal status: PARTIALLY MET  5.  Pt will be able to perform sit to stand from regular height chair without UE assist in order to demonstrate improved function related to increase in BLE strength. Baseline: 01/12/22: Unable to perform sit to stand without heavy UE assist; 02/16/22: Unable to perform from a regular height chair without UE assist but strength improving compared to initial evaluation; Goal status:  ONGOING   PLAN: PT FREQUENCY: 2x/week  PT DURATION: 12 weeks  PLANNED INTERVENTIONS: Therapeutic exercises, Therapeutic activity, Neuromuscular re-education, Balance training, Gait training, Patient/Family education, Joint manipulation, Joint mobilization, Canalith repositioning, Aquatic Therapy, Dry Needling, Electrical stimulation, Spinal manipulation, Spinal mobilization, Cryotherapy, Moist heat, Taping, Traction, Ultrasound, Ionotophoresis 42m/ml Dexamethasone, and Manual therapy  PLAN FOR NEXT SESSION:  review/modify HEP as needed, continue strengthening and stretching.  JPhillips GroutPT, DPT, GCS  Physical Therapist- [facility] 03/16/2022, 1:11 PM

## 2022-03-16 ENCOUNTER — Ambulatory Visit: Payer: Medicare Other

## 2022-03-16 DIAGNOSIS — R262 Difficulty in walking, not elsewhere classified: Secondary | ICD-10-CM

## 2022-03-16 DIAGNOSIS — M25561 Pain in right knee: Secondary | ICD-10-CM | POA: Diagnosis not present

## 2022-03-16 DIAGNOSIS — M6281 Muscle weakness (generalized): Secondary | ICD-10-CM

## 2022-03-16 DIAGNOSIS — G8929 Other chronic pain: Secondary | ICD-10-CM

## 2022-03-16 NOTE — Therapy (Signed)
OUTPATIENT PHYSICAL THERAPY KNEE TREATMENT   Patient Name: Charlotte Glover MRN: [id number] DOB:1928-04-03, 88 y.o., female Today's Date: 03/19/2022   PT End of Session - 03/19/22 1109     Visit Number 16    Number of Visits 25    Date for PT Re-Evaluation 04/06/22    Authorization Type eval: 01/12/22    PT Start Time 1103    PT Stop Time 1145    PT Time Calculation (min) 42 min    Activity Tolerance Patient tolerated treatment well    Behavior During Therapy [facility] for tasks assessed/performed            Past Medical History:  Diagnosis Date   (HFpEF) heart failure with preserved ejection fraction ([place]) 06/21/2020   a.) TTE 06/21/2020: EF >55%, RAE, G1DD   Amaurosis fugax of left eye    Arthritis    Atrial fibrillation ([place])    a.) Dx'd in 2009; b.) CHA2DS2-VASc = 6 (age x 2, sex, CHF, HTN, prior MI); c.) s/p PVI 09/31/2013; DCCV (200 J x 1) 04/02/2012, repeat PVI with roof line, coronary sinus isolation, and CFAE ablation 01/02/2013; DCCV 01/18/2013 (200 J x 1); AV nodal ablation 05/28/2014;  d.) rate/rhythm maintained without pharmacological intervention; chronically anticoagulated using rivaroxaban   Cardiac arrest ([place]) 10/2009   a.) witnessed arrest at home; husband performed CPR; ROSC achieved in the field. Admitted for workup --> etiology of CV event undetermined; felt to be secondary to "electrolyte abnormalities".   Cardiac murmur    CHB (complete heart block) ([place])    a.) s/p PPM 05/28/2014; b.) CRT-P upgrade 07/13/2016.   Chickenpox    CKD (chronic kidney disease), stage III (HCC)    Coronary artery disease    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/13/2013   a.) grade II; ER/PR (+)   Dyspnea    Edema    GERD (gastroesophageal reflux disease)    Granulomatous disease ([place])    a.) spleen   Hiatal hernia    HLD (hyperlipidemia)    HOH (hard of hearing)    Hypertension 01/23/2012   [place] esophagus    Long term current use of  antithrombotics/antiplatelets    a.) rivaroxaban   Melanoma of lower leg, left (HCC)    Meningioma, multiple ([place]) 07/14/2019   a.) MRI brain 07/14/2019: 1.2 x 1.3 x 1.3 cm midline posterior fossa meningioma inferior to the cerebellar vertex; 7 x 3 mm dural based meningioma along frontal convexity   NSTEMI (non-ST elevated myocardial infarction) ([place]) 08/2009   Presence of permanent cardiac pacemaker    Pulmonary HTN ([place]) 05/08/2016   a.) R/LHC 05/08/2016: EF >55, LVEDP 15, mean PA 38, AO sat 97, RVSP 50, CO 3 L/min   S/P ablation of atrial fibrillation    Shingles    SSS (sick sinus syndrome) ([place])    a.) CRT-P in place   Valvular heart disease 02/08/2012   a.) cMRI 02/08/2012: EF 50-55, mild-mod AR, mod MR; b.) TEE 02/26/2012: EF >55, mild MR/AR/TR; c.) R/LHC 05/08/2016: sev MR; d.) TEE 06/14/2016: EF >55, sev MR, mod AR/TR; e.) TTE 10/05/2016: EF >55, triv PR, mod AR/MR/TR; f.) TTE 04/22/2018: EF >55, mild MR/TR/PR, mod AR; g.) TTE 11/27/2019: EF 50, mild PR, mod AR/MR/TR; h.) TTE 06/21/2020: EF >55, triv PR, mod AR/MR/TR   Wears hearing aid in both ears    Past Surgical History:  Procedure Laterality Date   ANTERIOR VITRECTOMY Left 03/20/2017   Procedure: ANTERIOR VITRECTOMY;  Surgeon:  Leandrew Koyanagi, MD;  Location: [place];  Service: Ophthalmology;  Laterality: Left;  IVA TOPICAL LEFT   ARTERY BIOPSY Right 04/08/2019   Procedure: BIOPSY TEMPORAL ARTERY;  Surgeon: Algernon Huxley, MD;  Location: [facility];  Service: Vascular;  Laterality: Right;   ATRIAL FIBRILLATION ABLATION N/A 03/24/2012   Procedure: ATRIAL FIBRILLATION ABLATION (PVI); Location: [facility]   ATRIAL FIBRILLATION ABLATION N/A 01/02/2013   Procedure: ATRIAL FIBRILLATION ABLATION (PVI, roof line, coronary sinus siolation, CFAE ablation); Location: [facility]   AV NODE ABLATION N/A 05/26/2014   Procedure: AV NODE ABLATION; Location: [facility]   BIV PACEMAKER INSERTION CRT-P N/A 07/13/2016   Procedure: CRT-P BiV  PACEMAKER UPGRADE; Location: [facility]   BREAST BIOPSY Right 2015   + for DCIS    BREAST SURGERY     CARDIAC CATHETERIZATION Bilateral 05/08/2016   Procedure: Right/Left Heart Cath and Coronary Angiography;  Surgeon: Isaias Cowman, MD;  Location: [facility];  Service: Cardiovascular;  Laterality: Bilateral;   CARDIOVERSION N/A 04/02/2012   Procedure: CARDIOVERSION; Location: [facility]   CARDIOVERSION N/A 01/15/2013   Procedure: CARDIOVERSION; Location: [facility]   CATARACT EXTRACTION W/PHACO Left 03/20/2017   Procedure: IOL EXCHANGE;  Surgeon: Leandrew Koyanagi, MD;  Location: [place];  Service: Ophthalmology;  Laterality: Left;   CHOLECYSTECTOMY     COLONOSCOPY     patient reports several   DILATION AND CURETTAGE OF UTERUS     ESOPHAGOGASTRODUODENOSCOPY     ESOPHAGOGASTRODUODENOSCOPY (EGD) WITH PROPOFOL N/A 12/13/2020   Procedure: ESOPHAGOGASTRODUODENOSCOPY (EGD) WITH PROPOFOL;  Surgeon: Lesly Rubenstein, MD;  Location: [facility];  Service: Endoscopy;  Laterality: N/A;   KNEE ARTHROPLASTY Right 12/25/2021   Procedure: COMPUTER ASSISTED TOTAL KNEE ARTHROPLASTY;  Surgeon: Dereck Leep, MD;  Location: [facility];  Service: Orthopedics;  Laterality: Right;   PACEMAKER INSERTION N/A 05/28/2014   Procedure: PACEMAKER INSERTION (dual chamber); Location: [facility]   PERIPHERAL IRIDOTOMY Left 03/20/2017   Procedure: PERIPHERAL IRIDECTOMY;  Surgeon: Leandrew Koyanagi, MD;  Location: [place];  Service: Ophthalmology;  Laterality: Left;   ROTATOR CUFF REPAIR Left    TEE WITHOUT CARDIOVERSION N/A 05/16/2016   Procedure: TRANSESOPHAGEAL ECHOCARDIOGRAM (TEE);  Surgeon: Teodoro Spray, MD;  Location: [facility];  Service: Cardiovascular;  Laterality: N/A;   TONSILLECTOMY     uterine polyp     Patient Active Problem List   Diagnosis Date Noted   Total knee replacement status 12/25/2021   Prediabetes 12/14/2021   Acquired thrombophilia ([place]) 08/07/2021   Primary  osteoarthritis of right knee 04/09/2021   Swelling of limb 11/01/2020   Enlarged pulmonary artery ([place]) 05/23/2020   Moderate aortic insufficiency 03/07/2020   Intracranial carotid stenosis, right 05/29/2019   Chronic headaches 05/29/2019   Long term current use of systemic steroids 05/04/2019   Screening for osteoporosis 05/04/2019   Temporal arteritis ([place]) 04/07/2019   Meningioma ([place]) 12/29/2018   Chronic daily headache 11/20/2018   Elevated erythrocyte sedimentation rate 11/05/2018   CKD (chronic kidney disease) stage 3, GFR 30-59 ml/min ([employer]) 12/27/2017   Shingles 05/06/2017   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 04/12/2017   Primary osteoarthritis of right shoulder 12/07/2016   Rotator cuff tear, right 12/07/2016   Rotator cuff tendinitis, right 12/07/2016   CHB (complete heart block) ([place]) 06/30/2016   Severe mitral regurgitation 05/29/2016   Pedal edema 04/23/2016   Lung nodule, multiple 12/28/2015   Amnesia, global, transient 04/05/2015   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 06/30/2014  Biventricular cardiac pacemaker in situ 05/18/2014   Sleep disorder 05/06/2014   Diffuse pain 04/06/2014   Fatigue 03/16/2014   Headache 03/16/2014   History of melanoma 11/24/2013   Melanoma ([place]) 11/24/2013   DCIS (ductal carcinoma in situ) 08/11/2013   History of ductal carcinoma in situ (DCIS) of breast 08/11/2013   H/O non-ST elevation myocardial infarction (NSTEMI) 01/01/2013   Hearing impairment 01/01/2013   S/P ablation of atrial fibrillation 07/11/2012   Edema 01/23/2012   Hypertension 01/23/2012   Atrial fibrillation ([place]) 11/27/2011   Coronary artery disease 11/27/2011    PCP: Adin Hector, MD  REFERRING PROVIDER: Dereck Leep, MD  REFERRING DIAGNOSIS: [phone] (ICD-10-CM) - Presence of right artificial knee joint  THERAPY DIAG: Chronic pain of right knee  Muscle weakness (generalized)  Difficulty  in walking, not elsewhere classified  RATIONALE FOR EVALUATION AND TREATMENT: Rehabilitation  ONSET DATE: 12/25/21  FOLLOW UP APPT WITH PROVIDER: Yes    FROM INITIAL EVALUATION  SUBJECTIVE:                                                                                                                                                                                         Chief Complaint: R Knee replacement 12/25/21  Pertinent History Pt underwent R TKR 12/25/21 due to ongoing chronic pain in her knee. No reported post-operative complications with the exception of some post-operative bleeding. She has been regularly icing, elevating, and wearing compression stockings on the RLE. No compression hose upon arrival today. She had HH PT after the surgery and reports that it went well. She has continued performing her HEP including mini squats and standing hip abduction. Two falls since the surgery but denies any trauma to right knee. Husband and family have been assisting with IADLs since the surgery.  Pain:  Pain Intensity: Present: 0/10, Best: 0/10, Worst: 8/10 Pain location: R Knee Pain quality: aching  Radiating pain: No  Swelling: Yes  Numbness/Tingling: No 24-hour pain behavior: Varies throughout the day History of prior back, hip, or knee injury, pain, surgery, or therapy: No, chronic R knee pain but no prior history of R knee or hip surgery Falls: Has patient fallen in last 6 months? 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  Recent memory is intact.  Remote memory is intact.  Attention span and concentration are intact.  Expressive speech is intact.  Patient's fund of knowledge is within normal limits for educational level.    Gross Musculoskeletal Assessment Tremor: None Bulk: Mild swelling around R knee. Bandage is covering incision however pt indicates that the bandage is wet from her shower yesterday and she doesn't know if she should change the dressing. She does not have any additional dressings at home to use. Tone: Normal  GAIT: Distance walked: 200' with front wheeled walker. Antalgic with decreased stance time on RLE. Decreased self-selected gait speed but functional for limited community mobility Assistive device utilized: Walker - 2 wheeled Level of assistance: Modified independence  Posture: Forward head and rounded shoulders, mild resting R knee flexion  AROM  AROM (Normal range in degrees) AROM  03/19/2022  Lumbar   Flexion (65)   Extension (30)   Right lateral flexion (25)   Left lateral flexion (25)   Right rotation (30)   Left rotation (30)       Hip Right Left  Flexion (125)    Extension (15)    Abduction (40)    Adduction     Internal Rotation (45)    External Rotation (45)        Knee    Flexion (135) 97 135  Extension (0) -6 +2 (hyperextension)      Ankle    Dorsiflexion (20)    Plantarflexion (50)    Inversion (35)    Eversion (15    (* = pain; Blank rows = not tested)  LE MMT:  MMT (out of 5) Right 03/19/2022 Left 03/19/2022  Hip flexion 3+ 3+  Hip extension    Hip abduction    Hip adduction    Hip internal rotation    Hip external rotation    Knee  flexion 5 5  Knee extension 5 5  Ankle dorsiflexion 4 4  Ankle plantarflexion Active Active  Ankle inversion    Ankle eversion    (* = pain; Blank rows = not tested)  Sensation Deferred  Reflexes Deferred  Muscle Length Hamstrings: R: Positive for decreased length at 70 degrees L: Not examined  Palpation Mild pain to palpation at medial and lateral knee. Hematoma palpated in R anteromedial thigh just proximal to the knee joint.   Passive Accessory Motion Deferred  VASCULAR Deferred  SPECIAL TESTS Deferred   TODAY'S TREATMENT  SUBJECTIVE: Pt reports she is doing well today. She arrives ambulating with a single point cane. No significant updates reported since the last therapy session. Denies falls or resting pain upon arrival.  PAIN: Denies pain  Ther-ex  NuStep L1-2 BUE/BLE x 5 minutes for warm-up during interval history High knee forward stepping in // bars with BUE support x 4 lengths; Walking lunges in // bars with BUE support x 2 lengths; Side stepping in // bars with 5# AW and BUE support x 4 lengths;  Standing exercises with 5# ankle weights (AW): Hip flexion marches x 20 BLE; Hamstring curls x 20 BLE; Hip abduction x 20 BLE;  Squats with BUE support 2 x 15; Standing R TKE with black tband resistance  x 20; Standing heel raises with BUE support x 15;   Neuromuscular Re-education  Airex balance feet apart x 30s; Airex balance feet apart with horizontal and vertical head turns x 30s each; Airex balance beam side stepping x multiple lengths; Alternating 6" step taps without UE support x 10 BLE;   Not performed: Supine R knee extension stretch with heel on towel roll and therapist providing gentle STM to R posterior knee/hamstrings; Supine R patella superior mobilizations grade II-III, 3 x 30s; Supine R tibia on femur PA mobilizations at available end range extension to improve extension, grade I-II, 30s/bout x 3 bouts; Supine R tibia external rotation  mobilizations at available end range to improve extension, grade I-II, 30s/bout x 2 bouts; Sit to stand without UE support from regular height chair with Airex pad on seat 2 x 7; Forward lunges with R foot on second step with BUE support x 15; 6" forward step-ups alternating leading LE with BUE support x 10; Obstacle course in // bars with 6" hurdles Forward gait across grass without assistive device x 70'; Retro gait across grass without assistive device x 30,' very challenging for patient; Lateral stepping in the grass x 30' each direction;   PATIENT EDUCATION:  Education details: Pt educated throughout session about proper posture and technique with exercises. Improved exercise technique, movement at target joints, use of target muscles after min to mod verbal, visual, tactile cues.  Person educated: Patient Education method: Explanation and demonstration Education comprehension: verbalized understanding and returned demonstration   HOME EXERCISE PROGRAM: Access Code: [email or web address] URL: [email or web address] Date: 01/14/2022 Prepared by: Roxana Hires  Exercises - Supine Ankle Pumps  - 2 x daily - 7 x weekly - 2 sets - 10 reps - Supine Quadricep Sets (Mirrored)  - 2 x daily - 7 x weekly - 2 sets - 10 reps - 3s hold - Supine Active Straight Leg Raise  - 2 x daily - 7 x weekly - 2 sets - 10 reps - Supine Heel Slides  - 2 x daily - 7 x weekly - 2 sets - 10 reps - Seated Long Arc Quad  - 2 x daily - 7 x weekly - 2 sets - 10 reps - Mini Squat with Counter Support  - 2 x daily - 7 x weekly - 2 sets - 10 reps - Standing Hip Abduction with Counter Support (Mirrored)  - 2 x daily - 7 x weekly - 2 sets - 10 reps - 3s (perform on both sides) hold - Seated Knee Flexion Stretch  - 2 x daily - 7 x weekly - 3 reps - 60s (progress to longer holds) hold - Seated Passive Knee Extension (Mirrored)  - 2 x daily - 7 x weekly - 3 reps - 60s (progress to longer) hold  Patient Education -  Total Knee Replacement Handout   ASSESSMENT:  CLINICAL IMPRESSION: Continued strengthening today and continued to work on dynamic stability/balance as well. She continues demonstrating improved RLE strength and is making excellent progress. Her gait is improving with improving R heel strike. Discussed updating outcome measures and goals at some point over the next few appointments to discuss possible discharge. She will continue to benefit from skilled PT services to progress R knee strength and mobility to optimize return to PLOF.   REHAB POTENTIAL: Excellent  CLINICAL DECISION MAKING: Evolving/moderate complexity  EVALUATION COMPLEXITY: Low   GOALS: Goals reviewed with patient? Yes  SHORT TERM GOALS: Target date: 02/23/2022   Pt will be  independent with HEP to improve strength and decrease knee pain to improve pain-free function at home. Baseline:  Goal status: INITIAL   LONG TERM GOALS: Target date: 04/06/2022   Pt will increase FOTO to at least 57 to demonstrate significant improvement in function at home and work related to knee pain  Baseline: 01/12/22: 37, 02/15/22: 57 Goal status: ACHIEVED  2.  Pt will decrease worst knee pain by at least 3 points on the NPRS in order to demonstrate clinically significant reduction in back pain. Baseline: 01/12/22: worst 8/10; 02/16/22: 1/10; Goal status: ACHIEVED  3.  Pt will increase strength of R quad (perform 10 full SLR without quad lag) in order to demonstrate improvement in strength and function related to R TKR  Baseline: 01/12/22: Able to perform SLR but with notable quad lag; 02/16/22: SLR x 10 without quad lag Goal status: ACHIEVED  4.  Pt will increase R knee AAROM to 0 degrees extension and at least 120 degrees of flexion in order to improve function with gait and stairs. Baseline: 01/12/22: -6 to 97; 02/16/22: -3 TO 122;  Goal status: PARTIALLY MET  5.  Pt will be able to perform sit to stand from regular height chair without UE  assist in order to demonstrate improved function related to increase in BLE strength. Baseline: 01/12/22: Unable to perform sit to stand without heavy UE assist; 02/16/22: Unable to perform from a regular height chair without UE assist but strength improving compared to initial evaluation; Goal status: ONGOING   PLAN: PT FREQUENCY: 2x/week  PT DURATION: 12 weeks  PLANNED INTERVENTIONS: Therapeutic exercises, Therapeutic activity, Neuromuscular re-education, Balance training, Gait training, Patient/Family education, Joint manipulation, Joint mobilization, Canalith repositioning, Aquatic Therapy, Dry Needling, Electrical stimulation, Spinal manipulation, Spinal mobilization, Cryotherapy, Moist heat, Taping, Traction, Ultrasound, Ionotophoresis 4mg /ml Dexamethasone, and Manual therapy  PLAN FOR NEXT SESSION:  review/modify HEP as needed, continue strengthening and stretching.  Phillips Grout PT, DPT, GCS  Physical Therapist- [facility]  03/19/2022, 1:06 PM

## 2022-03-19 ENCOUNTER — Ambulatory Visit: Payer: Medicare Other

## 2022-03-19 DIAGNOSIS — G8929 Other chronic pain: Secondary | ICD-10-CM

## 2022-03-19 DIAGNOSIS — M25561 Pain in right knee: Secondary | ICD-10-CM | POA: Diagnosis not present

## 2022-03-19 DIAGNOSIS — R262 Difficulty in walking, not elsewhere classified: Secondary | ICD-10-CM

## 2022-03-19 DIAGNOSIS — M6281 Muscle weakness (generalized): Secondary | ICD-10-CM

## 2022-03-20 ENCOUNTER — Other Ambulatory Visit: Payer: Self-pay | Admitting: Gastroenterology

## 2022-03-20 ENCOUNTER — Other Ambulatory Visit (HOSPITAL_COMMUNITY): Payer: Self-pay | Admitting: Gastroenterology

## 2022-03-20 DIAGNOSIS — R1314 Dysphagia, pharyngoesophageal phase: Secondary | ICD-10-CM

## 2022-03-20 DIAGNOSIS — R6881 Early satiety: Secondary | ICD-10-CM

## 2022-03-27 ENCOUNTER — Ambulatory Visit: Payer: Medicare Other | Attending: Orthopedic Surgery

## 2022-03-27 DIAGNOSIS — M25561 Pain in right knee: Secondary | ICD-10-CM | POA: Insufficient documentation

## 2022-03-27 DIAGNOSIS — M6281 Muscle weakness (generalized): Secondary | ICD-10-CM | POA: Insufficient documentation

## 2022-03-27 DIAGNOSIS — G8929 Other chronic pain: Secondary | ICD-10-CM | POA: Diagnosis present

## 2022-03-27 NOTE — Therapy (Signed)
OUTPATIENT PHYSICAL THERAPY KNEE TREATMENT   Patient Name: Charlotte Glover MRN: [id number] DOB:[birth year], 88 y.o., female Today's Date: 03/27/2022   PT End of Session - 03/27/22 1026     Visit Number 17    Number of Visits 25    Date for PT Re-Evaluation 04/06/22    Authorization Type eval: 01/12/22    PT Start Time 1017    PT Stop Time 1100    PT Time Calculation (min) 43 min    Activity Tolerance Patient tolerated treatment well    Behavior During Therapy WFL for tasks assessed/performed            Past Medical History:  Diagnosis Date   (HFpEF) heart failure with preserved ejection fraction ([place]) 06/21/2020   a.) TTE 06/21/2020: EF >55%, RAE, G1DD   Amaurosis fugax of left eye    Arthritis    Atrial fibrillation ([place])    a.) Dx'd in 2009; b.) CHA2DS2-VASc = 6 (age x 2, sex, CHF, HTN, prior MI); c.) s/p PVI 09/31/2013; DCCV (200 J x 1) 04/02/2012, repeat PVI with roof line, coronary sinus isolation, and CFAE ablation 01/02/2013; DCCV 01/18/2013 (200 J x 1); AV nodal ablation 05/28/2014;  d.) rate/rhythm maintained without pharmacological intervention; chronically anticoagulated using rivaroxaban   Cardiac arrest ([place]) 10/2009   a.) witnessed arrest at home; husband performed CPR; ROSC achieved in the field. Admitted for workup --> etiology of CV event undetermined; felt to be secondary to "electrolyte abnormalities".   Cardiac murmur    CHB (complete heart block) ([place])    a.) s/p PPM 05/28/2014; b.) CRT-P upgrade 07/13/2016.   Chickenpox    CKD (chronic kidney disease), stage III (HCC)    Coronary artery disease    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/13/2013   a.) grade II; ER/PR (+)   Dyspnea    Edema    GERD (gastroesophageal reflux disease)    Granulomatous disease ([place])    a.) spleen   Hiatal hernia    HLD (hyperlipidemia)    HOH (hard of hearing)    Hypertension 01/23/2012   [place] esophagus    Long term current use of  antithrombotics/antiplatelets    a.) rivaroxaban   Melanoma of lower leg, left (HCC)    Meningioma, multiple ([place]) 07/14/2019   a.) MRI brain 07/14/2019: 1.2 x 1.3 x 1.3 cm midline posterior fossa meningioma inferior to the cerebellar vertex; 7 x 3 mm dural based meningioma along frontal convexity   NSTEMI (non-ST elevated myocardial infarction) ([place]) 08/2009   Presence of permanent cardiac pacemaker    Pulmonary HTN ([place]) 05/08/2016   a.) R/LHC 05/08/2016: EF >55, LVEDP 15, mean PA 38, AO sat 97, RVSP 50, CO 3 L/min   S/P ablation of atrial fibrillation    Shingles    SSS (sick sinus syndrome) ([place])    a.) CRT-P in place   Valvular heart disease 02/08/2012   a.) cMRI 02/08/2012: EF 50-55, mild-mod AR, mod MR; b.) TEE 02/26/2012: EF >55, mild MR/AR/TR; c.) R/LHC 05/08/2016: sev MR; d.) TEE 06/14/2016: EF >55, sev MR, mod AR/TR; e.) TTE 10/05/2016: EF >55, triv PR, mod AR/MR/TR; f.) TTE 04/22/2018: EF >55, mild MR/TR/PR, mod AR; g.) TTE 11/27/2019: EF 50, mild PR, mod AR/MR/TR; h.) TTE 06/21/2020: EF >55, triv PR, mod AR/MR/TR   Wears hearing aid in both ears    Past Surgical History:  Procedure Laterality Date   ANTERIOR VITRECTOMY Left 03/20/2017   Procedure: ANTERIOR VITRECTOMY;  Surgeon:  Leandrew Koyanagi, MD;  Location: [facility];  Service: Ophthalmology;  Laterality: Left;  IVA TOPICAL LEFT   ARTERY BIOPSY Right 04/08/2019   Procedure: BIOPSY TEMPORAL ARTERY;  Surgeon: Algernon Huxley, MD;  Location: [facility];  Service: Vascular;  Laterality: Right;   ATRIAL FIBRILLATION ABLATION N/A 03/24/2012   Procedure: ATRIAL FIBRILLATION ABLATION (PVI); Location: [facility]   ATRIAL FIBRILLATION ABLATION N/A 01/02/2013   Procedure: ATRIAL FIBRILLATION ABLATION (PVI, roof line, coronary sinus siolation, CFAE ablation); Location: [facility]   AV NODE ABLATION N/A 05/26/2014   Procedure: AV NODE ABLATION; Location: [facility]   BIV PACEMAKER INSERTION CRT-P N/A 07/13/2016   Procedure: CRT-P BiV  PACEMAKER UPGRADE; Location: [facility]   BREAST BIOPSY Right 2015   + for DCIS    BREAST SURGERY     CARDIAC CATHETERIZATION Bilateral 05/08/2016   Procedure: Right/Left Heart Cath and Coronary Angiography;  Surgeon: Isaias Cowman, MD;  Location: [facility];  Service: Cardiovascular;  Laterality: Bilateral;   CARDIOVERSION N/A 04/02/2012   Procedure: CARDIOVERSION; Location: [facility]   CARDIOVERSION N/A 01/15/2013   Procedure: CARDIOVERSION; Location: [facility]   CATARACT EXTRACTION W/PHACO Left 03/20/2017   Procedure: IOL EXCHANGE;  Surgeon: Leandrew Koyanagi, MD;  Location: [facility];  Service: Ophthalmology;  Laterality: Left;   CHOLECYSTECTOMY     COLONOSCOPY     patient reports several   DILATION AND CURETTAGE OF UTERUS     ESOPHAGOGASTRODUODENOSCOPY     ESOPHAGOGASTRODUODENOSCOPY (EGD) WITH PROPOFOL N/A 12/13/2020   Procedure: ESOPHAGOGASTRODUODENOSCOPY (EGD) WITH PROPOFOL;  Surgeon: Lesly Rubenstein, MD;  Location: [facility];  Service: Endoscopy;  Laterality: N/A;   KNEE ARTHROPLASTY Right 12/25/2021   Procedure: COMPUTER ASSISTED TOTAL KNEE ARTHROPLASTY;  Surgeon: Dereck Leep, MD;  Location: [facility];  Service: Orthopedics;  Laterality: Right;   PACEMAKER INSERTION N/A 05/28/2014   Procedure: PACEMAKER INSERTION (dual chamber); Location: [facility]   PERIPHERAL IRIDOTOMY Left 03/20/2017   Procedure: PERIPHERAL IRIDECTOMY;  Surgeon: Leandrew Koyanagi, MD;  Location: [facility];  Service: Ophthalmology;  Laterality: Left;   ROTATOR CUFF REPAIR Left    TEE WITHOUT CARDIOVERSION N/A 05/16/2016   Procedure: TRANSESOPHAGEAL ECHOCARDIOGRAM (TEE);  Surgeon: Teodoro Spray, MD;  Location: [facility];  Service: Cardiovascular;  Laterality: N/A;   TONSILLECTOMY     uterine polyp     Patient Active Problem List   Diagnosis Date Noted   Total knee replacement status 12/25/2021   Prediabetes 12/14/2021   Acquired thrombophilia ([place]) 08/07/2021   Primary  osteoarthritis of right knee 04/09/2021   Swelling of limb 11/01/2020   Enlarged pulmonary artery ([place]) 05/23/2020   Moderate aortic insufficiency 03/07/2020   Intracranial carotid stenosis, right 05/29/2019   Chronic headaches 05/29/2019   Long term current use of systemic steroids 05/04/2019   Screening for osteoporosis 05/04/2019   Temporal arteritis ([place]) 04/07/2019   Meningioma ([place]) 12/29/2018   Chronic daily headache 11/20/2018   Elevated erythrocyte sedimentation rate 11/05/2018   CKD (chronic kidney disease) stage 3, GFR 30-59 ml/min ([employer]) 12/27/2017   Shingles 05/06/2017   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 04/12/2017   Primary osteoarthritis of right shoulder 12/07/2016   Rotator cuff tear, right 12/07/2016   Rotator cuff tendinitis, right 12/07/2016   CHB (complete heart block) ([place]) 06/30/2016   Severe mitral regurgitation 05/29/2016   Pedal edema 04/23/2016   Lung nodule, multiple 12/28/2015   Amnesia, global, transient 04/05/2015   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) 11/24/2013   DCIS (ductal carcinoma in situ) 08/11/2013   History of ductal carcinoma in situ (DCIS) of breast 08/11/2013   H/O non-ST elevation myocardial infarction (NSTEMI) 01/01/2013   Hearing impairment 01/01/2013   S/P ablation of atrial fibrillation 07/11/2012   Edema 01/23/2012   Hypertension 01/23/2012   Atrial fibrillation ([place]) 11/27/2011   Coronary artery disease 11/27/2011   PCP: Adin Hector, MD  REFERRING PROVIDER: Dereck Leep, MD  REFERRING DIAGNOSIS: [id number] (ICD-10-CM) - Presence of right artificial knee joint  THERAPY DIAG: Chronic pain of right knee  Muscle weakness (generalized)  RATIONALE FOR  EVALUATION AND TREATMENT: Rehabilitation  ONSET DATE: 12/25/21  FOLLOW UP APPT WITH PROVIDER: Yes    FROM INITIAL EVALUATION  SUBJECTIVE:                                                                                                                                                                                         Chief Complaint: R Knee replacement 12/25/21  Pertinent History Pt underwent R TKR 12/25/21 due to ongoing chronic pain in her knee. No reported post-operative complications with the exception of some post-operative bleeding. She has been regularly icing, elevating, and wearing compression stockings on the RLE. No compression hose upon arrival today. She had HH PT after the surgery and reports that it went well. She has continued performing her HEP including mini squats and standing hip abduction. Two falls since the surgery but denies any trauma to right knee. Husband and family have been assisting with IADLs since the surgery.  Pain:  Pain Intensity: Present: 0/10, Best: 0/10, Worst: 8/10 Pain location: R Knee Pain quality: aching  Radiating pain: No  Swelling: Yes  Numbness/Tingling: No 24-hour pain behavior: Varies throughout the day History of prior back, hip, or knee injury, pain, surgery, or therapy: No, chronic R knee pain but no prior history of R knee or hip surgery Falls: Has patient fallen in last 6 months? 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  Recent memory is intact.  Remote memory is intact.  Attention span and concentration are intact.  Expressive speech is intact.  Patient's fund of knowledge is within normal limits for educational level.    Gross Musculoskeletal Assessment Tremor: None Bulk: Mild swelling around R knee. Bandage is covering incision however pt indicates that the bandage is wet from her shower yesterday and she doesn't know if she should change the dressing. She does not have any additional dressings at home to use. Tone: Normal  GAIT: Distance walked: 200' with front wheeled walker. Antalgic with decreased stance time on RLE. Decreased self-selected gait speed but functional for limited community mobility Assistive device utilized: Walker - 2 wheeled Level of assistance: Modified independence  Posture: Forward head and rounded shoulders, mild resting R knee flexion  AROM  AROM (Normal range in degrees) AROM  03/27/2022  Lumbar   Flexion (65)   Extension (30)   Right lateral flexion (25)   Left lateral flexion (25)   Right rotation (30)   Left rotation (30)       Hip Right Left  Flexion (125)    Extension (15)    Abduction (40)    Adduction     Internal Rotation (45)    External Rotation (45)        Knee    Flexion (135) 97 135  Extension (0) -6 +2 (hyperextension)      Ankle    Dorsiflexion (20)    Plantarflexion (50)    Inversion (35)    Eversion (15    (* = pain; Blank rows = not tested)  LE MMT:  MMT (out of 5) Right 03/27/2022 Left 03/27/2022  Hip flexion 3+ 3+  Hip extension    Hip abduction    Hip adduction    Hip internal rotation    Hip external rotation    Knee flexion 5 5  Knee extension 5 5  Ankle dorsiflexion 4 4  Ankle  plantarflexion Active Active  Ankle inversion    Ankle eversion    (* = pain; Blank rows = not tested)  Sensation Deferred  Reflexes Deferred  Muscle Length Hamstrings: R: Positive for decreased length at 70 degrees L: Not examined  Palpation Mild pain to palpation at medial and lateral knee. Hematoma palpated in R anteromedial thigh just proximal to the knee joint.   Passive Accessory Motion Deferred  VASCULAR Deferred  SPECIAL TESTS Deferred   TODAY'S TREATMENT   SUBJECTIVE: Pt reports she is doing well today. She arrives ambulating with a single point cane. No significant updates reported since the last therapy session. Denies falls or resting pain upon arrival.   PAIN: Denies pain   Ther-ex  NuStep L1-2 BUE/BLE x 5 minutes for warm-up during interval history High knee forward stepping in // bars with BUE support x 4 lengths; Walking lunges in // bars with BUE support x 2 lengths; Squats with BUE support 2 x 15; Standing R TKE with black tband resistance x 20; Standing heel raises with BUE support x 15; Consult with SLP due to complaints of dysphagia (unbilled);   Not performed: Supine R knee extension stretch with heel on towel roll and therapist providing gentle STM to R posterior knee/hamstrings; Supine  R patella superior mobilizations grade II-III, 3 x 30s; Supine R tibia on femur PA mobilizations at available end range extension to improve extension, grade I-II, 30s/bout x 3 bouts; Supine R tibia external rotation mobilizations at available end range to improve extension, grade I-II, 30s/bout x 2 bouts; Sit to stand without UE support from regular height chair with Airex pad on seat 2 x 7; Forward lunges with R foot on second step with BUE support x 15; 6" forward step-ups alternating leading LE with BUE support x 10; Obstacle course in // bars with 6" hurdles Forward gait across grass without assistive device x 70'; Retro gait across grass without  assistive device x 30,' very challenging for patient; Lateral stepping in the grass x 30' each direction; Side stepping in // bars with 5# AW and BUE support x 4 lengths; Standing exercises with 5# ankle weights (AW): Hip flexion marches x 20 BLE; Hamstring curls x 20 BLE; Hip abduction x 20 BLE; Airex balance feet apart x 30s; Airex balance feet apart with horizontal and vertical head turns x 30s each; Airex balance beam side stepping x multiple lengths; Alternating 6" step taps without UE support x 10 BLE;   PATIENT EDUCATION:  Education details: Pt educated throughout session about proper posture and technique with exercises. Improved exercise technique, movement at target joints, use of target muscles after min to mod verbal, visual, tactile cues.  Person educated: Patient Education method: Explanation and demonstration Education comprehension: verbalized understanding and returned demonstration   HOME EXERCISE PROGRAM: Access Code: [email or web address] URL: [email or web address] Date: 01/14/2022 Prepared by: Roxana Hires  Exercises - Supine Ankle Pumps  - 2 x daily - 7 x weekly - 2 sets - 10 reps - Supine Quadricep Sets (Mirrored)  - 2 x daily - 7 x weekly - 2 sets - 10 reps - 3s hold - Supine Active Straight Leg Raise  - 2 x daily - 7 x weekly - 2 sets - 10 reps - Supine Heel Slides  - 2 x daily - 7 x weekly - 2 sets - 10 reps - Seated Long Arc Quad  - 2 x daily - 7 x weekly - 2 sets - 10 reps - Mini Squat with Counter Support  - 2 x daily - 7 x weekly - 2 sets - 10 reps - Standing Hip Abduction with Counter Support (Mirrored)  - 2 x daily - 7 x weekly - 2 sets - 10 reps - 3s (perform on both sides) hold - Seated Knee Flexion Stretch  - 2 x daily - 7 x weekly - 3 reps - 60s (progress to longer holds) hold - Seated Passive Knee Extension (Mirrored)  - 2 x daily - 7 x weekly - 3 reps - 60s (progress to longer) hold  Patient Education - Total Knee Replacement  Handout   ASSESSMENT:  CLINICAL IMPRESSION: Pt demonstrates excellent motivation during session today. Continued strengthening with patient and she denies any pain today. She continues demonstrating improved RLE strength and is making excellent progress. Her gait is improving with improving R heel strike. Consulted with SLP due to complaints of dysphagia. Pt is going to return on Friday for a free screen with SLP. Plan is to update outcome measures, goals, and discharge patient at next session. She will continue to benefit from skilled PT services to progress R knee strength and mobility to optimize return to PLOF.   REHAB POTENTIAL: Excellent  CLINICAL DECISION MAKING: Evolving/moderate complexity  EVALUATION COMPLEXITY: Low  GOALS: Goals reviewed with patient? Yes  SHORT TERM GOALS: Target date: 02/23/2022   Pt will be independent with HEP to improve strength and decrease knee pain to improve pain-free function at home. Baseline:  Goal status: INITIAL   LONG TERM GOALS: Target date: 04/06/2022   Pt will increase FOTO to at least 57 to demonstrate significant improvement in function at home and work related to knee pain  Baseline: 01/12/22: 37, 02/15/22: 57 Goal status: ACHIEVED  2.  Pt will decrease worst knee pain by at least 3 points on the NPRS in order to demonstrate clinically significant reduction in back pain. Baseline: 01/12/22: worst 8/10; 02/16/22: 1/10; Goal status: ACHIEVED  3.  Pt will increase strength of R quad (perform 10 full SLR without quad lag) in order to demonstrate improvement in strength and function related to R TKR  Baseline: 01/12/22: Able to perform SLR but with notable quad lag; 02/16/22: SLR x 10 without quad lag Goal status: ACHIEVED  4.  Pt will increase R knee AAROM to 0 degrees extension and at least 120 degrees of flexion in order to improve function with gait and stairs. Baseline: 01/12/22: -6 to 97; 02/16/22: -3 TO 122;  Goal status: PARTIALLY  MET  5.  Pt will be able to perform sit to stand from regular height chair without UE assist in order to demonstrate improved function related to increase in BLE strength. Baseline: 01/12/22: Unable to perform sit to stand without heavy UE assist; 02/16/22: Unable to perform from a regular height chair without UE assist but strength improving compared to initial evaluation; Goal status: ONGOING   PLAN: PT FREQUENCY: 2x/week  PT DURATION: 12 weeks  PLANNED INTERVENTIONS: Therapeutic exercises, Therapeutic activity, Neuromuscular re-education, Balance training, Gait training, Patient/Family education, Joint manipulation, Joint mobilization, Canalith repositioning, Aquatic Therapy, Dry Needling, Electrical stimulation, Spinal manipulation, Spinal mobilization, Cryotherapy, Moist heat, Taping, Traction, Ultrasound, Ionotophoresis 59m/ml Dexamethasone, and Manual therapy  PLAN FOR NEXT SESSION:  review/modify HEP as needed, continue strengthening and stretching.  JPhillips GroutPT, DPT, GCS  Physical Therapist- [facility] 03/27/2022, 9:41 PM

## 2022-03-30 ENCOUNTER — Ambulatory Visit
Admission: RE | Admit: 2022-03-30 | Discharge: 2022-03-30 | Disposition: A | Discharge status: Home / Self Care | Payer: Medicare Other | Source: Ambulatory Visit | Attending: Gastroenterology | Admitting: Gastroenterology

## 2022-03-30 ENCOUNTER — Ambulatory Visit: Payer: Medicare Other | Admitting: Speech Pathology

## 2022-03-30 DIAGNOSIS — R1314 Dysphagia, pharyngoesophageal phase: Secondary | ICD-10-CM | POA: Diagnosis present

## 2022-04-02 ENCOUNTER — Ambulatory Visit: Payer: Medicare Other | Admitting: Speech Pathology

## 2022-04-04 ENCOUNTER — Ambulatory Visit: Payer: Medicare Other

## 2022-04-13 ENCOUNTER — Encounter
Admission: RE | Admit: 2022-04-13 | Discharge: 2022-04-13 | Disposition: A | Discharge status: Home / Self Care | Payer: Medicare Other | Source: Ambulatory Visit | Attending: Gastroenterology | Admitting: Gastroenterology

## 2022-04-13 DIAGNOSIS — R1314 Dysphagia, pharyngoesophageal phase: Secondary | ICD-10-CM | POA: Insufficient documentation

## 2022-04-13 DIAGNOSIS — R6881 Early satiety: Secondary | ICD-10-CM | POA: Diagnosis present

## 2022-04-13 MED ORDER — TECHNETIUM TC 99M SULFUR COLLOID
2.0000 | Freq: Once | INTRAVENOUS | Status: AC | PRN
  Administered 2022-04-13: 2.34 via ORAL

## 2022-04-23 ENCOUNTER — Encounter (INDEPENDENT_AMBULATORY_CARE_PROVIDER_SITE_OTHER): Payer: Self-pay

## 2022-12-18 ENCOUNTER — Other Ambulatory Visit: Payer: Self-pay | Admitting: Physician Assistant

## 2022-12-18 ENCOUNTER — Ambulatory Visit
Admission: RE | Admit: 2022-12-18 | Discharge: 2022-12-18 | Disposition: A | Discharge status: Home / Self Care | Payer: Medicare Other | Source: Ambulatory Visit | Attending: Physician Assistant | Admitting: Physician Assistant

## 2022-12-18 DIAGNOSIS — G44201 Tension-type headache, unspecified, intractable: Secondary | ICD-10-CM

## 2022-12-18 DIAGNOSIS — Z7901 Long term (current) use of anticoagulants: Secondary | ICD-10-CM | POA: Diagnosis present

## 2022-12-21 ENCOUNTER — Other Ambulatory Visit: Payer: Self-pay | Admitting: Physician Assistant

## 2022-12-21 ENCOUNTER — Other Ambulatory Visit: Payer: Self-pay

## 2022-12-21 DIAGNOSIS — D329 Benign neoplasm of meninges, unspecified: Secondary | ICD-10-CM

## 2023-01-08 ENCOUNTER — Encounter (HOSPITAL_COMMUNITY): Payer: Self-pay

## 2023-01-08 ENCOUNTER — Ambulatory Visit (HOSPITAL_COMMUNITY): Admission: RE | Admit: 2023-01-08 | Payer: Medicare Other | Source: Ambulatory Visit

## 2023-01-31 ENCOUNTER — Ambulatory Visit
Admission: EM | Admit: 2023-01-31 | Discharge: 2023-01-31 | Disposition: A | Discharge status: Home / Self Care | Payer: Medicare Other | Attending: Emergency Medicine | Admitting: Emergency Medicine

## 2023-01-31 ENCOUNTER — Ambulatory Visit (INDEPENDENT_AMBULATORY_CARE_PROVIDER_SITE_OTHER): Payer: Medicare Other

## 2023-01-31 DIAGNOSIS — M25512 Pain in left shoulder: Secondary | ICD-10-CM

## 2023-01-31 NOTE — ED Provider Notes (Signed)
HPI  SUBJECTIVE:  Charlotte Glover is a right-handed 89 y.o. female who presents with 2 episodes of a sensation of her left shoulder coming out of joint followed by sharp pain today.  States that she was reaching towards 1 time and backwards the second time.  She was able to reduce it and resolve her pain by bending forward, hanging her arm in a dependent position and circling her hand.  States the pain was located in her whole shoulder and radiated down her arm.  She is not in any pain right now.  She denies trauma, fall, bruising, swelling.  No numbness or tingling in her hand, grip weakness.  She tried keeping still and taking Tylenol with improvement in her symptoms.  Symptoms are worse with any shoulder movement.  She is status post left rotator cuff surgery over 15 years ago.  She has a complicated medical history including atrial fibrillation on Eliquis, complete heart block status post pacemaker placement, stage III chronic kidney disease, hypertension, hyperlipidemia, TIA, temporal arteritis, HFpEF.  She is currently on prednisone for temporal arteritis.  [facility].  [facility]thopedics: [facility]   Past Medical History:  Diagnosis Date   (HFpEF) heart failure with preserved ejection fraction (HCC) 06/21/2020   a.) TTE 06/21/2020: EF >55%, RAE, G1DD   Amaurosis fugax of left eye    Arthritis    Atrial fibrillation (HCC)    a.) Dx'd in 2009; b.) CHA2DS2-VASc = 6 (age x 2, sex, CHF, HTN, prior MI); c.) s/p PVI 09/31/2013; DCCV (200 J x 1) 04/02/2012, repeat PVI with roof line, coronary sinus isolation, and CFAE ablation 01/02/2013; DCCV 01/18/2013 (200 J x 1); AV nodal ablation 05/28/2014;  d.) rate/rhythm maintained without pharmacological intervention; chronically anticoagulated using rivaroxaban   Cardiac arrest (HCC) 10/2009   a.) witnessed arrest at home; husband performed CPR; ROSC achieved in the field. Admitted for workup --> etiology of CV event undetermined; felt to be  secondary to "electrolyte abnormalities".   Cardiac murmur    CHB (complete heart block) (HCC)    a.) s/p PPM 05/28/2014; b.) CRT-P upgrade 07/13/2016.   Chickenpox    CKD (chronic kidney disease), stage III (HCC)    Coronary artery disease    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/13/2013   a.) grade II; ER/PR (+)   Dyspnea    Edema    GERD (gastroesophageal reflux disease)    Granulomatous disease (HCC)    a.) spleen   Hiatal hernia    HLD (hyperlipidemia)    HOH (hard of hearing)    Hypertension 01/23/2012   Jackhammer esophagus    Long term current use of antithrombotics/antiplatelets    a.) rivaroxaban   Melanoma of lower leg, left (HCC)    Meningioma, multiple (HCC) 07/14/2019   a.) MRI brain 07/14/2019: 1.2 x 1.3 x 1.3 cm midline posterior fossa meningioma inferior to the cerebellar vertex; 7 x 3 mm dural based meningioma along frontal convexity   NSTEMI (non-ST elevated myocardial infarction) (HCC) 08/2009   Presence of permanent cardiac pacemaker    Pulmonary HTN (HCC) 05/08/2016   a.) R/LHC 05/08/2016: EF >55, LVEDP 15, mean PA 38, AO sat 97, RVSP 50, CO 3 L/min   S/P ablation of atrial fibrillation    Shingles    SSS (sick sinus syndrome) (HCC)    a.) CRT-P in place   Valvular heart disease 02/08/2012   a.) cMRI 02/08/2012: EF 50-55, mild-mod AR, mod MR; b.) TEE 02/26/2012:  EF >55, mild MR/AR/TR; c.) R/LHC 05/08/2016: sev MR; d.) TEE 06/14/2016: EF >55, sev MR, mod AR/TR; e.) TTE 10/05/2016: EF >55, triv PR, mod AR/MR/TR; f.) TTE 04/22/2018: EF >55, mild MR/TR/PR, mod AR; g.) TTE 11/27/2019: EF 50, mild PR, mod AR/MR/TR; h.) TTE 06/21/2020: EF >55, triv PR, mod AR/MR/TR   Wears hearing aid in both ears     Past Surgical History:  Procedure Laterality Date   ANTERIOR VITRECTOMY Left 03/20/2017   Procedure: ANTERIOR VITRECTOMY;  Surgeon: Lockie Mola, MD;  Location: [facility];  Service: Ophthalmology;  Laterality: Left;  IVA TOPICAL LEFT    ARTERY BIOPSY Right 04/08/2019   Procedure: BIOPSY TEMPORAL ARTERY;  Surgeon: Annice Needy, MD;  Location: [facility];  Service: Vascular;  Laterality: Right;   ATRIAL FIBRILLATION ABLATION N/A 03/24/2012   Procedure: ATRIAL FIBRILLATION ABLATION (PVI); Location: [facility]   ATRIAL FIBRILLATION ABLATION N/A 01/02/2013   Procedure: ATRIAL FIBRILLATION ABLATION (PVI, roof line, coronary sinus siolation, CFAE ablation); Location: [facility]   AV NODE ABLATION N/A 05/26/2014   Procedure: AV NODE ABLATION; Location: [facility]   BIV PACEMAKER INSERTION CRT-P N/A 07/13/2016   Procedure: CRT-P BiV PACEMAKER UPGRADE; Location: [facility]   BREAST BIOPSY Right 2015   + for DCIS    BREAST SURGERY     CARDIAC CATHETERIZATION Bilateral 05/08/2016   Procedure: Right/Left Heart Cath and Coronary Angiography;  Surgeon: Marcina Millard, MD;  Location: [facility];  Service: Cardiovascular;  Laterality: Bilateral;   CARDIOVERSION N/A 04/02/2012   Procedure: CARDIOVERSION; Location: [facility]   CARDIOVERSION N/A 01/15/2013   Procedure: CARDIOVERSION; Location: [facility]   CATARACT EXTRACTION W/PHACO Left 03/20/2017   Procedure: IOL EXCHANGE;  Surgeon: Lockie Mola, MD;  Location: [facility];  Service: Ophthalmology;  Laterality: Left;   CHOLECYSTECTOMY     COLONOSCOPY     patient reports several   DILATION AND CURETTAGE OF UTERUS     ESOPHAGOGASTRODUODENOSCOPY     ESOPHAGOGASTRODUODENOSCOPY (EGD) WITH PROPOFOL N/A 12/13/2020   Procedure: ESOPHAGOGASTRODUODENOSCOPY (EGD) WITH PROPOFOL;  Surgeon: Regis Bill, MD;  Location: [facility];  Service: Endoscopy;  Laterality: N/A;   KNEE ARTHROPLASTY Right 12/25/2021   Procedure: COMPUTER ASSISTED TOTAL KNEE ARTHROPLASTY;  Surgeon: Donato Heinz, MD;  Location: [facility];  Service: Orthopedics;  Laterality: Right;   PACEMAKER INSERTION N/A 05/28/2014   Procedure: PACEMAKER INSERTION (dual chamber); Location: [facility]   PERIPHERAL IRIDOTOMY Left 03/20/2017    Procedure: PERIPHERAL IRIDECTOMY;  Surgeon: Lockie Mola, MD;  Location: [facility];  Service: Ophthalmology;  Laterality: Left;   ROTATOR CUFF REPAIR Left    TEE WITHOUT CARDIOVERSION N/A 05/16/2016   Procedure: TRANSESOPHAGEAL ECHOCARDIOGRAM (TEE);  Surgeon: Dalia Heading, MD;  Location: [facility];  Service: Cardiovascular;  Laterality: N/A;   TONSILLECTOMY     uterine polyp      Family History  Problem Relation Age of Onset   Breast cancer Mother    Cancer Sister    Cancer Brother    Cancer Daughter    Breast cancer Daughter    Cancer Son     Social History   Tobacco Use   Smoking status: Never   Smokeless tobacco: Never  Vaping Use   Vaping status: Never Used  Substance Use Topics   Alcohol use: Yes    Alcohol/week: 7.0 standard drinks of alcohol    Types: 7 Shots of liquor per week    Comment:  (1 scotch daily)   Drug use: No    No  current facility-administered medications for this encounter.  Current Outpatient Medications:    Acetaminophen 500 MG coapsule, Take by mouth., Disp: , Rfl:    azelastine (ASTELIN) 0.1 % nasal spray, Place into both nostrils as needed. Use in each nostril as directed, Disp: , Rfl:    celecoxib (CELEBREX) 200 MG capsule, Take 1 capsule (200 mg total) by mouth 2 (two) times daily., Disp: 90 capsule, Rfl: 0   furosemide (LASIX) 40 MG tablet, Take 40 mg by mouth 2 (two) times daily. , Disp: , Rfl: 3   gabapentin (NEURONTIN) 300 MG capsule, Take 300 mg by mouth 3 (three) times daily., Disp: , Rfl:    metolazone (ZAROXOLYN) 5 MG tablet, Take 5 mg by mouth once a week. Takes on Friday, Disp: , Rfl:    Multiple Vitamins-Minerals (PRESERVISION AREDS 2) CAPS, Take 1 capsule by mouth 2 (two) times daily., Disp: , Rfl:    nitroGLYCERIN (NITROSTAT) 0.4 MG SL tablet, Place 0.4 mg under the tongue every 5 (five) minutes x 2 doses as needed for chest pain. , Disp: , Rfl:    omeprazole (PRILOSEC) 20 MG capsule, Take 40 mg by mouth 2  (two) times daily before a meal., Disp: , Rfl:    Polyvinyl Alcohol-Povidone (REFRESH OP), Apply 1 drop to eye daily as needed (dry eyes)., Disp: , Rfl:    potassium chloride (KLOR-CON) 10 MEQ tablet, Take 10 mEq by mouth 3 (three) times daily., Disp: , Rfl:    rivaroxaban (XARELTO) 20 MG TABS tablet, Take 20 mg by mouth daily after breakfast., Disp: , Rfl:    rosuvastatin (CRESTOR) 10 MG tablet, Take 10 mg by mouth daily., Disp: , Rfl:    senna (SENOKOT) 8.6 MG TABS tablet, Take 1 tablet by mouth daily as needed for mild constipation., Disp: , Rfl:    spironolactone (ALDACTONE) 25 MG tablet, Take 25 mg by mouth daily., Disp: , Rfl:    traMADol (ULTRAM) 50 MG tablet, Take 1 tablet (50 mg total) by mouth every 4 (four) hours as needed for moderate pain., Disp: 30 tablet, Rfl: 0   triamcinolone ointment (KENALOG) 0.1 %, Apply 1 application  topically as needed., Disp: , Rfl:   Allergies  Allergen Reactions   Amiodarone Other (See Comments)    Bradycardia and sinus pauses   Baclofen Other (See Comments)    Double vision   Metoprolol Rash   Pantoprazole Rash   Tape Rash     ROS  As noted in HPI.   Physical Exam  BP (!) 169/71 (BP Location: Right Arm)   Pulse 75   Temp 97.9 F (36.6 C) (Oral)   Resp 18   Wt 65.8 kg   SpO2 96%   BMI 22.71 kg/m   Constitutional: Well developed, well nourished, no acute distress Eyes:  EOMI, conjunctiva normal bilaterally HENT: Normocephalic, atraumatic,mucus membranes moist Respiratory: Normal inspiratory effort Cardiovascular: Normal rate GI: nondistended skin: No rash, skin intact Musculoskeletal: no deformities.  Left shoulder: Patient guarding left shoulder, but moving it spontaneously. Drop test normal,  clavicle NT , A/C joint NT, scapula NT, proximal humerus NT, trapezius  tender, shoulder joint NT, Motor strength normal , Sensation intact LT over deltoid region, distal NVI with hand having intact sensation and strength in the median,  radial, and ulnar nerve distribution. no pain with internal rotation, no pain with external rotation, negative tenderness in bicipital groove,  negative empty can test,  negative liftoff test,  no instability with abduction/external rotation. RP 2+  Neurologic: Alert & oriented x 3, no focal neuro deficits Psychiatric: Speech and behavior appropriate   ED Course   Medications - No data to display  Orders Placed This Encounter  Procedures   DG Shoulder Left    Standing Status:   Standing    Number of Occurrences:   1    Order Specific Question:   Reason for Exam (SYMPTOM  OR DIAGNOSIS REQUIRED)    Answer:   subluxation/dislocation x 2 this am- eval for relocation fx.   Apply shoulder immobilizer/sling    Standing Status:   Standing    Number of Occurrences:   1    Order Specific Question:   Reason for Shoulder immobilizer/Sling & Swathe    Answer:   Pain in arm    Order Specific Question:   Pain in arm location    Answer:   Pain in L arm (M79.602)    No results found for this or any previous visit (from the past 24 hour(s)). DG Shoulder Left  Result Date: 01/31/2023 CLINICAL DATA:  Subluxation/dislocation. The shoulder has since been relocated. EXAM: LEFT SHOULDER - 2+ VIEW COMPARISON:  None Available. FINDINGS: The left shoulder is located. Progressive degenerative changes are present in the left shoulder. The left hemithorax is clear. The heart is enlarged. Pacing wires are stable. IMPRESSION: 1. No acute abnormality. 2. Progressive degenerative changes of the left shoulder. Electronically Signed   By: Marin Roberts M.D.   On: 01/31/2023 15:10    ED Clinical Impression  1. Acute pain of left shoulder      ED Assessment/Plan     Reviewed imaging independently.  No dislocation.  Progressive degenerative changes in the left shoulder.  See radiology report for full details.  I suspect patient has a weak rotator cuff and she that she subluxed her shoulder but was able to  successfully reduce it.  Will place it in a sling to protect it and have her follow-up with her orthopedic surgeon at the [facility] clinic.  Discussed imaging, MDM, treatment plan, and plan for follow-up with patient and family.  Discussed sn/sx that should prompt return to the ED. they agree with plan.   No orders of the defined types were placed in this encounter.     *This clinic note was created using Dragon dictation software. Therefore, there may be occasional mistakes despite careful proofreading.  ?    Domenick Gong, MD 01/31/23 1531

## 2023-01-31 NOTE — Discharge Instructions (Signed)
Fortunately, your shoulder is in place.  There is no fracture.  You do have some progressive arthritis in it.  I suspect that you subluxed your shoulder but were able to put it back in place.  I am putting you in a sling to help protect it.  Ice.  Sleep with a pillow between your arm and your body at night.  Follow-up with your orthopedic surgeon at the [facility] clinic.

## 2023-01-31 NOTE — ED Triage Notes (Addendum)
Patient states that her elbow has been feeling like its popping out of socket. Left arm. Started today. Patient has pacemaker

## 2023-02-11 ENCOUNTER — Emergency Department: Payer: Medicare Other

## 2023-02-11 ENCOUNTER — Emergency Department
Admission: EM | Admit: 2023-02-11 | Discharge: 2023-02-11 | Disposition: A | Discharge status: Home / Self Care | Payer: Medicare Other | Attending: Emergency Medicine | Admitting: Emergency Medicine

## 2023-02-11 ENCOUNTER — Other Ambulatory Visit: Payer: Self-pay

## 2023-02-11 DIAGNOSIS — I4891 Unspecified atrial fibrillation: Secondary | ICD-10-CM | POA: Diagnosis not present

## 2023-02-11 DIAGNOSIS — Z7982 Long term (current) use of aspirin: Secondary | ICD-10-CM | POA: Diagnosis not present

## 2023-02-11 DIAGNOSIS — I2510 Atherosclerotic heart disease of native coronary artery without angina pectoris: Secondary | ICD-10-CM | POA: Insufficient documentation

## 2023-02-11 DIAGNOSIS — I129 Hypertensive chronic kidney disease with stage 1 through stage 4 chronic kidney disease, or unspecified chronic kidney disease: Secondary | ICD-10-CM | POA: Diagnosis not present

## 2023-02-11 DIAGNOSIS — N183 Chronic kidney disease, stage 3 unspecified: Secondary | ICD-10-CM | POA: Insufficient documentation

## 2023-02-11 DIAGNOSIS — D72829 Elevated white blood cell count, unspecified: Secondary | ICD-10-CM | POA: Insufficient documentation

## 2023-02-11 DIAGNOSIS — R079 Chest pain, unspecified: Secondary | ICD-10-CM

## 2023-02-11 DIAGNOSIS — Z7901 Long term (current) use of anticoagulants: Secondary | ICD-10-CM | POA: Insufficient documentation

## 2023-02-11 LAB — APTT: aPTT: 35 seconds (ref 24–36)

## 2023-02-11 LAB — BASIC METABOLIC PANEL
Anion gap: 15 (ref 5–15)
BUN: 36 mg/dL — ABNORMAL HIGH (ref 8–23)
CO2: 27 mmol/L (ref 22–32)
Calcium: 9.5 mg/dL (ref 8.9–10.3)
Chloride: 93 mmol/L — ABNORMAL LOW (ref 98–111)
Creatinine, Ser: 1.49 mg/dL — ABNORMAL HIGH (ref 0.44–1.00)
GFR, Estimated: 33 mL/min — ABNORMAL LOW (ref >60)
Glucose, Bld: 119 mg/dL — ABNORMAL HIGH (ref 70–99)
Potassium: 3.3 mmol/L — ABNORMAL LOW (ref 3.5–5.1)
Sodium: 135 mmol/L (ref 135–145)

## 2023-02-11 LAB — CBC
HCT: 43.5 % (ref 36.0–46.0)
Hemoglobin: 14.2 g/dL (ref 12.0–15.0)
MCH: 29.6 pg (ref 26.0–34.0)
MCHC: 32.6 g/dL (ref 30.0–36.0)
MCV: 90.8 fL (ref 80.0–100.0)
Platelets: 268 10*3/uL (ref 150–400)
RBC: 4.79 MIL/uL (ref 3.87–5.11)
RDW: 18.4 % — ABNORMAL HIGH (ref 11.5–15.5)
WBC: 13.7 10*3/uL — ABNORMAL HIGH (ref 4.0–10.5)
nRBC: 0 % (ref 0.0–0.2)

## 2023-02-11 LAB — PROTIME-INR
INR: 2.5 — ABNORMAL HIGH (ref 0.8–1.2)
Prothrombin Time: 27 seconds — ABNORMAL HIGH (ref 11.4–15.2)

## 2023-02-11 LAB — TROPONIN I (HIGH SENSITIVITY)
Troponin I (High Sensitivity): 18 ng/L — ABNORMAL HIGH (ref <18)
Troponin I (High Sensitivity): 21 ng/L — ABNORMAL HIGH (ref <18)

## 2023-02-11 NOTE — ED Triage Notes (Signed)
Pt c/o mid-sternal CP that started when she was checking in at the [facility] for her physical. Pt took 1 sublingual nitroglycerin without relief. Pt reports it is sharp in nature. Denies SOB or nausea.

## 2023-02-11 NOTE — ED Provider Notes (Signed)
[facility] Provider Note    Event Date/Time   First MD Initiated Contact with Patient 02/11/23 1551     (approximate)   History   Chest Pain   HPI Charlotte Glover is a 89 y.o. female with A-fib s/p pacemaker, HTN, HLD, CKD stage III, CAD, prior history of NSTEMI without stent placement, on Eliquis and aspirin who presents today for chest pain.  Patient states earlier today she was in her clinic for routine visit when she had sharp midline chest pain associated with diaphoresis and slight lightheadedness.  She denied any shortness of breath or nausea/vomiting.  Symptoms lasted approximately 40 minutes and did not improve with nitro before resolution on their own.     Physical Exam   Triage Vital Signs: ED Triage Vitals  Encounter Vitals Group     BP 02/11/23 1126 (!) 143/70     Systolic BP Percentile --      Diastolic BP Percentile --      Pulse Rate 02/11/23 1126 71     Resp 02/11/23 1126 (!) 22     Temp 02/11/23 1126 97.8 F (36.6 C)     Temp Source 02/11/23 1126 Oral     SpO2 02/11/23 1126 99 %     Weight 02/11/23 1127 145 lb (65.8 kg)     Height 02/11/23 1127 5\' 10"  (1.778 m)     Head Circumference --      Peak Flow --      Pain Score 02/11/23 1127 10     Pain Loc --      Pain Education --      Exclude from Growth Chart --     Most recent vital signs: Vitals:   02/11/23 1126 02/11/23 1638  BP: (!) 143/70 (!) 140/80  Pulse: 71 75  Resp: (!) 22 18  Temp: 97.8 F (36.6 C)   SpO2: 99% 100%   Physical Exam: I have reviewed the vital signs and nursing notes. General: Awake, alert, no acute distress.  Nontoxic appearing. Head:  Atraumatic, normocephalic.   ENT:  EOM intact, PERRL. Oral mucosa is pink and moist with no lesions. Neck: Neck is supple with full range of motion, No meningeal signs. Cardiovascular:  RRR, No murmurs. Peripheral pulses palpable and equal bilaterally. Respiratory:  Symmetrical chest wall expansion.  No  rhonchi, rales, or wheezes.  Good air movement throughout.  No use of accessory muscles.   Musculoskeletal:  No cyanosis or edema. Moving extremities with full ROM Abdomen:  Soft, nontender, nondistended. Neuro:  GCS 15, moving all four extremities, interacting appropriately. Speech clear. Psych:  Calm, appropriate.   Skin:  Warm, dry, no rash.     ED Results / Procedures / Treatments   Labs (all labs ordered are listed, but only abnormal results are displayed) Labs Reviewed  BASIC METABOLIC PANEL - Abnormal; Notable for the following components:      Result Value   Potassium 3.3 (*)    Chloride 93 (*)    Glucose, Bld 119 (*)    BUN 36 (*)    Creatinine, Ser 1.49 (*)    GFR, Estimated 33 (*)    All other components within normal limits  CBC - Abnormal; Notable for the following components:   WBC 13.7 (*)    RDW 18.4 (*)    All other components within normal limits  PROTIME-INR - Abnormal; Notable for the following components:   Prothrombin Time 27.0 (*)    INR 2.5 (*)  All other components within normal limits  TROPONIN I (HIGH SENSITIVITY) - Abnormal; Notable for the following components:   Troponin I (High Sensitivity) 18 (*)    All other components within normal limits  TROPONIN I (HIGH SENSITIVITY) - Abnormal; Notable for the following components:   Troponin I (High Sensitivity) 21 (*)    All other components within normal limits  APTT     EKG See ED course for my interpretation   RADIOLOGY See ED course for my interpretation   PROCEDURES:  Critical Care performed: No  Procedures   MEDICATIONS ORDERED IN ED: Medications - No data to display   IMPRESSION / MDM / ASSESSMENT AND PLAN / ED COURSE  I reviewed the triage vital signs and the nursing notes.                              Differential diagnosis includes, but is not limited to, ACS, NSTEMI, unstable angina, pneumonia, esophagitis, esophageal spasm.  Patient's presentation is most  consistent with acute presentation with potential threat to life or bodily function.  Patient is a 89 year old female presenting today for chest pain which has resolved at this time but also associated with diaphoresis concerning for ACS.  Initial EKG was reassuring and consistent with her baseline.  Troponins were found to be slightly elevated at 21 and 18.  Laboratory workup otherwise rather unremarkable aside from slight hypokalemia.  Patient has on prednisone right now which explains her elevation in the WBC.  Discussed the case with cardiology given her slight troponin elevation but no recent baseline and heart score of 5.  Given her story, very minimal troponin elevation, and EKG, they feel reassured at this time and do not think she needs to stay in the hospital for further evaluation.  I would like to see her in their clinic this week.  Patient was agreeable with this plan as she also did not want to stay in the hospital further and was ready to be discharged at this time.  Patient was given strict return precautions and will follow-up with cardiology this week.  The patient is on the cardiac monitor to evaluate for evidence of arrhythmia and/or significant heart rate changes. Clinical Course as of 02/11/23 2027  Mon Feb 11, 2023  1552 Troponin I (High Sensitivity)(!) Troponin delta of 3 [DW]  1552 Basic metabolic panel(!) Mild hypokalemia.  Creatinine otherwise comparable to baseline. [DW]  1553 CBC(!) Mild leukocytosis with no obvious source at this time [DW]  1553 DG Chest 2 View Independently viewed and interpreted chest x-ray with no acute abnormalities. [DW]  1619 WBC(!): 13.7 Patient is on prednisone for giant cell temporal arteritis at this time. [DW]  1622 Heart score of 5.  Will discuss the case with her cardiology team. [DW]  1625 My EKG interpretation: Rate of 71, ventricular paced rhythm.  No acute ST elevation or depression [DW]  1634 Discussed the case with Dr. Melton Alar who  feels pretty reassured with her workup here in the hospital and her being asymptomatic at this time.  Troponin elevations are very low and may be where she chronically sits with no recent labs.  She does have availability in her clinic and states she can see the patient this week but otherwise is safe to go home at this time. [DW]    Clinical Course User Index [DW] Janith Lima, MD     FINAL CLINICAL IMPRESSION(S) / ED DIAGNOSES  Final diagnoses:  Chest pain, unspecified type     Rx / DC Orders   ED Discharge Orders          Ordered    Ambulatory referral to Cardiology       Comments: If you have not heard from the Cardiology office within the next 72 hours please call [phone].   02/11/23 1635             Note:  This document was prepared using Dragon voice recognition software and may include unintentional dictation errors.   Janith Lima, MD 02/11/23 2030

## 2023-02-11 NOTE — Discharge Instructions (Signed)
You were seen today for your chest pain symptoms.  Your heart markers are very slightly elevated at this time and I did discuss the case with the cardiologist on-call.  With you being asymptomatic, she feels more comfortable seeing you outpatient for further follow-up.  Please call their office and schedule a visit this week.  Please return to the emergency department if you notice any significant worsening of symptoms.

## 2023-02-15 DIAGNOSIS — M316 Other giant cell arteritis: Secondary | ICD-10-CM | POA: Diagnosis present

## 2023-03-01 ENCOUNTER — Other Ambulatory Visit: Payer: Self-pay | Admitting: Ophthalmology

## 2023-03-01 DIAGNOSIS — H539 Unspecified visual disturbance: Secondary | ICD-10-CM

## 2023-03-04 ENCOUNTER — Ambulatory Visit
Admission: RE | Admit: 2023-03-04 | Discharge: 2023-03-04 | Disposition: A | Discharge status: Home / Self Care | Payer: Medicare Other | Source: Ambulatory Visit | Attending: Ophthalmology | Admitting: Ophthalmology

## 2023-03-04 DIAGNOSIS — H539 Unspecified visual disturbance: Secondary | ICD-10-CM | POA: Diagnosis present

## 2023-04-29 ENCOUNTER — Other Ambulatory Visit: Payer: Self-pay | Admitting: Physical Medicine & Rehabilitation

## 2023-04-29 DIAGNOSIS — G8929 Other chronic pain: Secondary | ICD-10-CM

## 2023-05-01 ENCOUNTER — Other Ambulatory Visit: Payer: Medicare Other

## 2023-05-02 ENCOUNTER — Ambulatory Visit
Admission: RE | Admit: 2023-05-02 | Discharge: 2023-05-02 | Disposition: A | Discharge status: Home / Self Care | Payer: Medicare Other | Source: Ambulatory Visit | Attending: Physical Medicine & Rehabilitation | Admitting: Physical Medicine & Rehabilitation

## 2023-05-02 DIAGNOSIS — G8929 Other chronic pain: Secondary | ICD-10-CM

## 2023-05-03 ENCOUNTER — Other Ambulatory Visit: Payer: Medicare Other

## 2023-05-08 ENCOUNTER — Telehealth: Payer: Self-pay

## 2023-05-08 NOTE — Telephone Encounter (Signed)
I call pt to let her know that she was established with [facility] and [facility].  PT states she is in pain and can't wait to see [facility].  Pt states she is not going back to [facility]. PT  also sates that [facility] told her to bad her appt is in [place] she will just have to wait.  PT said she wants to see someone now Not [place].

## 2023-05-08 NOTE — Telephone Encounter (Signed)
Pt is aware that due to her being est with [facility] and has upcoming appt with [facility] appt cancelled

## 2023-05-13 ENCOUNTER — Ambulatory Visit: Payer: Medicare Other | Admitting: Gastroenterology

## 2023-05-21 NOTE — Therapy (Signed)
OUTPATIENT PHYSICAL THERAPY THORACOLUMBAR EVALUATION   Patient Name: Charlotte Glover MRN: [id number] DOB:1927/10/02, 89 y.o., female Today's Date: 05/22/2023  END OF SESSION:  PT End of Session - 05/22/23 1009     Visit Number 1    Number of Visits 25    Date for PT Re-Evaluation 08/14/23    Authorization Type eval: 05/22/23    PT Start Time 1020    PT Stop Time 1100    PT Time Calculation (min) 40 min    Activity Tolerance Patient tolerated treatment well    Behavior During Therapy WFL for tasks assessed/performed             Past Medical History:  Diagnosis Date   (HFpEF) heart failure with preserved ejection fraction (HCC) 06/21/2020   a.) TTE 06/21/2020: EF >55%, RAE, G1DD   Amaurosis fugax of left eye    Arthritis    Atrial fibrillation (HCC)    a.) Dx'd in 2009; b.) CHA2DS2-VASc = 6 (age x 2, sex, CHF, HTN, prior MI); c.) s/p PVI 09/31/2013; DCCV (200 J x 1) 04/02/2012, repeat PVI with roof line, coronary sinus isolation, and CFAE ablation 01/02/2013; DCCV 01/18/2013 (200 J x 1); AV nodal ablation 05/28/2014;  d.) rate/rhythm maintained without pharmacological intervention; chronically anticoagulated using rivaroxaban   Cardiac arrest (HCC) 10/2009   a.) witnessed arrest at home; husband performed CPR; ROSC achieved in the field. Admitted for workup --> etiology of CV event undetermined; felt to be secondary to "electrolyte abnormalities".   Cardiac murmur    CHB (complete heart block) (HCC)    a.) s/p PPM 05/28/2014; b.) CRT-P upgrade 07/13/2016.   Chickenpox    CKD (chronic kidney disease), stage III (HCC)    Coronary artery disease    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/13/2013   a.) grade II; ER/PR (+)   Dyspnea    Edema    GERD (gastroesophageal reflux disease)    Granulomatous disease (HCC)    a.) spleen   Hiatal hernia    HLD (hyperlipidemia)    HOH (hard of hearing)    Hypertension 01/23/2012   Jackhammer esophagus    Long  term current use of antithrombotics/antiplatelets    a.) rivaroxaban   Melanoma of lower leg, left (HCC)    Meningioma, multiple (HCC) 07/14/2019   a.) MRI brain 07/14/2019: 1.2 x 1.3 x 1.3 cm midline posterior fossa meningioma inferior to the cerebellar vertex; 7 x 3 mm dural based meningioma along frontal convexity   NSTEMI (non-ST elevated myocardial infarction) (HCC) 08/2009   Presence of permanent cardiac pacemaker    Pulmonary HTN (HCC) 05/08/2016   a.) R/LHC 05/08/2016: EF >55, LVEDP 15, mean PA 38, AO sat 97, RVSP 50, CO 3 L/min   S/P ablation of atrial fibrillation    Shingles    SSS (sick sinus syndrome) (HCC)    a.) CRT-P in place   Valvular heart disease 02/08/2012   a.) cMRI 02/08/2012: EF 50-55, mild-mod AR, mod MR; b.) TEE 02/26/2012: EF >55, mild MR/AR/TR; c.) R/LHC 05/08/2016: sev MR; d.) TEE 06/14/2016: EF >55, sev MR, mod AR/TR; e.) TTE 10/05/2016: EF >55, triv PR, mod AR/MR/TR; f.) TTE 04/22/2018: EF >55, mild MR/TR/PR, mod AR; g.) TTE 11/27/2019: EF 50, mild PR, mod AR/MR/TR; h.) TTE 06/21/2020: EF >55, triv PR, mod AR/MR/TR   Wears hearing aid in both ears    Past Surgical History:  Procedure Laterality Date   ANTERIOR VITRECTOMY Left 03/20/2017   Procedure:  ANTERIOR VITRECTOMY;  Surgeon: Lockie Mola, MD;  Location: [facility];  Service: Ophthalmology;  Laterality: Left;  IVA TOPICAL LEFT   ARTERY BIOPSY Right 04/08/2019   Procedure: BIOPSY TEMPORAL ARTERY;  Surgeon: Annice Needy, MD;  Location: [facility];  Service: Vascular;  Laterality: Right;   ATRIAL FIBRILLATION ABLATION N/A 03/24/2012   Procedure: ATRIAL FIBRILLATION ABLATION (PVI); Location: [facility]   ATRIAL FIBRILLATION ABLATION N/A 01/02/2013   Procedure: ATRIAL FIBRILLATION ABLATION (PVI, roof line, coronary sinus siolation, CFAE ablation); Location: [facility]   AV NODE ABLATION N/A 05/26/2014   Procedure: AV NODE ABLATION; Location: [facility]   BIV PACEMAKER INSERTION CRT-P N/A 07/13/2016    Procedure: CRT-P BiV PACEMAKER UPGRADE; Location: [facility]   BREAST BIOPSY Right 2015   + for DCIS    BREAST SURGERY     CARDIAC CATHETERIZATION Bilateral 05/08/2016   Procedure: Right/Left Heart Cath and Coronary Angiography;  Surgeon: Marcina Millard, MD;  Location: [facility];  Service: Cardiovascular;  Laterality: Bilateral;   CARDIOVERSION N/A 04/02/2012   Procedure: CARDIOVERSION; Location: [facility]   CARDIOVERSION N/A 01/15/2013   Procedure: CARDIOVERSION; Location: [facility]   CATARACT EXTRACTION W/PHACO Left 03/20/2017   Procedure: IOL EXCHANGE;  Surgeon: Lockie Mola, MD;  Location: [facility];  Service: Ophthalmology;  Laterality: Left;   CHOLECYSTECTOMY     COLONOSCOPY     patient reports several   DILATION AND CURETTAGE OF UTERUS     ESOPHAGOGASTRODUODENOSCOPY     ESOPHAGOGASTRODUODENOSCOPY (EGD) WITH PROPOFOL N/A 12/13/2020   Procedure: ESOPHAGOGASTRODUODENOSCOPY (EGD) WITH PROPOFOL;  Surgeon: Regis Bill, MD;  Location: [facility];  Service: Endoscopy;  Laterality: N/A;   KNEE ARTHROPLASTY Right 12/25/2021   Procedure: COMPUTER ASSISTED TOTAL KNEE ARTHROPLASTY;  Surgeon: Donato Heinz, MD;  Location: [facility];  Service: Orthopedics;  Laterality: Right;   PACEMAKER INSERTION N/A 05/28/2014   Procedure: PACEMAKER INSERTION (dual chamber); Location: [facility]   PERIPHERAL IRIDOTOMY Left 03/20/2017   Procedure: PERIPHERAL IRIDECTOMY;  Surgeon: Lockie Mola, MD;  Location: [facility];  Service: Ophthalmology;  Laterality: Left;   ROTATOR CUFF REPAIR Left    TEE WITHOUT CARDIOVERSION N/A 05/16/2016   Procedure: TRANSESOPHAGEAL ECHOCARDIOGRAM (TEE);  Surgeon: Dalia Heading, MD;  Location: [facility];  Service: Cardiovascular;  Laterality: N/A;   TONSILLECTOMY     uterine polyp     Patient Active Problem List   Diagnosis Date Noted   Total knee replacement status 12/25/2021   Prediabetes 12/14/2021   Acquired thrombophilia (HCC)  08/07/2021   Primary osteoarthritis of right knee 04/09/2021   Swelling of limb 11/01/2020   Enlarged pulmonary artery (HCC) 05/23/2020   Moderate aortic insufficiency 03/07/2020   Intracranial carotid stenosis, right 05/29/2019   Chronic headaches 05/29/2019   Long term current use of systemic steroids 05/04/2019   Screening for osteoporosis 05/04/2019   Temporal arteritis (HCC) 04/07/2019   Meningioma (HCC) 12/29/2018   Chronic daily headache 11/20/2018   Elevated erythrocyte sedimentation rate 11/05/2018   CKD (chronic kidney disease) stage 3, GFR 30-59 ml/min (HCC) 12/27/2017   Shingles 05/06/2017   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 04/12/2017   Primary osteoarthritis of right shoulder 12/07/2016   Rotator cuff tear, right 12/07/2016   Rotator cuff tendinitis, right 12/07/2016   CHB (complete heart block) (HCC) 06/30/2016   Severe mitral regurgitation 05/29/2016   Pedal edema 04/23/2016   Lung nodule, multiple 12/28/2015   Amnesia, global, transient 04/05/2015   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 06/30/2014   Biventricular cardiac pacemaker in situ 05/18/2014   Sleep disorder 05/06/2014   Diffuse pain 04/06/2014   Fatigue 03/16/2014   Headache 03/16/2014   History of melanoma 11/24/2013   Melanoma (HCC) 11/24/2013   DCIS (ductal carcinoma in situ) 08/11/2013   History of ductal carcinoma in situ (DCIS) of breast 08/11/2013   H/O non-ST elevation myocardial infarction (NSTEMI) 01/01/2013   Hearing impairment 01/01/2013   S/P ablation of atrial fibrillation 07/11/2012   Edema 01/23/2012   Hypertension 01/23/2012   Atrial fibrillation (HCC) 11/27/2011   Coronary artery disease 11/27/2011    PCP: Lynnea Ferrier, MD  REFERRING PROVIDER: Elijah Birk, MD  REFERRING DIAG: [phone] (ICD-10-CM) - Chronic left-sided low back pain with left-sided sciatica   RATIONALE FOR EVALUATION AND TREATMENT:  Rehabilitation  THERAPY DIAG: Other low back pain  Difficulty in walking, not elsewhere classified  ONSET DATE: Chronic for years, recent acute exacerbation resolved  FOLLOW-UP APPT SCHEDULED WITH REFERRING PROVIDER: Yes    SUBJECTIVE:                                                                                                                                                                                         SUBJECTIVE STATEMENT:  Low back pain  PERTINENT HISTORY:  Pt states that for two weeks pt had severe pain in the middle of her low back. She has a history of chronic low back pain x 30 years. She was sleeping upright in a recliner to try to tolerate the pain. She saw Dr. Mariah Milling and since that time her back pain has been improved for the last 3 weeks. However she has severe numbness in both lower legs and feet as well as pain. She is also have progressive BLE weakness resulting in instability. Pt has been having headaches as well blurry vision in both eyes due to her Giant Cell Arteritis. She is on a high dose of prednisone and still gets injections in her eyes for the macular degeneration. She is receiving Actemra infusions in the hope that they can decrease her steroids. A few weeks ago she started having esophageal spasms which causes severe pain. She has tried peppermint which helped slightly. Pt saw a GI specialist at [facility] and is scheduled for a EGD on 06/10/23. She states that they want to consider botox injections. No more vertigo or dizziness. She denies any loss of control of bowel or bladder. She has been taking an old prescription of oxycodone. Physiatry ordered an EMG of the bilateral lower extremities. She is unable to take NSAIDs due to low GFR and being on Xarelto. She is  currently taking 600 mg daily of gabapentin. Physiatry would like to consider bilateral L5-S1 TFESI.  Imaging: CT lumbar spine 04/2023 done at [facility]: Transitional anatomy at L1; T12-L1 mild left  NFS; L1 to mild bilateral NFS; L3-4 moderate facet arthropathy with mild right NFS; L4-5 moderate facet arthropathy; L5-S1 severe right and mild left facet arthropathy with mild right NFS  PAIN:    Pain Intensity: Present: 0/10, Best: 0/10, Worst: 6/10 Pain location: Pain from knees down to feet (never in the thighs) Pain Quality: aching Radiating: No  Numbness/Tingling: Yes, BLE numbness in calves and feet Focal Weakness: Yes, BLE weakness Aggravating factors: Standing and twisting Relieving factors: Sitting, leg elevation, and heat 24-hour pain behavior: worst numbness in the AM, worst pain in legs at night History of prior back injury, pain, surgery, or therapy: Yes Dominant hand: right Imaging: Yes, see above Red flags: Negative for bowel/bladder changes, saddle paresthesia, personal history of cancer, h/o spinal tumors, h/o compression fx, h/o abdominal aneurysm, abdominal pain, chills/fever, night sweats, nausea, vomiting, unrelenting pain, first onset of insidious LBP <22 y/o  PRECAUTIONS: None  WEIGHT BEARING RESTRICTIONS: No  FALLS: Has patient fallen in last 6 months? No  Living Environment Lives with: lives with their spouse Lives in: House/apartment 2 steps with bilateral hand rails (able to reach both) to enter/exit garage, step-in shower with seat;  Prior level of function: Needs assistance with homemaking, independent with ADLs but requires assistance with cleaning home;  Hobbies: Bridge, golf (not currently playing)  Patient Goals: Decrease leg pain and numbness/tingling, improve function, strength, and balance   OBJECTIVE:  Patient Surveys  ABC scale To be completed FOTO 44, predicted improvement to 27  Cognition Patient is oriented to person, place, and time.  Recent memory is intact.  Remote memory is intact.  Attention span and concentration are intact.  Expressive speech is intact.  Patient's fund of knowledge is within normal limits for educational  level.    Gross Musculoskeletal Assessment Tremor: None Bulk: Decreased BLE muscle bulk noted Tone: Normal  GAIT: Distance walked: 100' Assistive device utilized: Single point cane Level of assistance: Modified independence Comments: Pt ambulates with single point cane  Posture: Lumbar lordosis: WNL Iliac crest height: Equal bilaterally Lumbar lateral shift: Negative  AROM Deferred  LE MMT: MMT (out of 5) Right  Left   Hip flexion 3+ 3+  Hip extension    Hip abduction (seated) 3+ 3+  Hip adduction (seated) 3+ 3+  Hip internal rotation 3+ 3+  Hip external rotation 3+ 3+  Knee flexion (seated) 3+ 3+  Knee extension 5 5  Ankle dorsiflexion 3+ 3+  Ankle plantarflexion    Ankle inversion    Ankle eversion    (* = pain; Blank rows = not tested)  Sensation Deferred  Reflexes Deferred  Muscle Length Hamstrings: R: Not examined L: Not examined Ely (quadriceps): R: Not examined L: Not examined Thomas (hip flexors): R: Not examined L: Not examined Ober: R: Not examined L: Not examined  Palpation Location Right Left         Lumbar paraspinals 0 0  Quadratus Lumborum 0 0  Gluteus Maximus    Gluteus Medius    Deep hip external rotators    PSIS 0 0  Fortin's Area (SIJ) 0 0  Greater Trochanter    (Blank rows = not tested) Graded on 0-4 scale (0 = no pain, 1 = pain, 2 = pain with wincing/grimacing/flinching, 3 = pain with withdrawal, 4 = unwilling  to allow palpation)  Passive Accessory Intervertebral Motion Deferred  Special Tests Deferred  Functional Tasks Deferred   Beighton scale Deferred   Functional Outcome Measures  Results Comments  BERG    DGI    FGA    TUG    5TSTS Unable without UE support   6 Minute Walk Test    10 Meter Gait Speed Self-selected: 13.7 s = 0.72 m/s; Fastest: 12.9 s = 0.78 m/s   (Blank rows = not tested)   TODAY'S TREATMENT: Deferred   PATIENT EDUCATION:  Education details: Plan of care Person educated:  Patient Education method: Explanation Education comprehension: verbalized understanding   HOME EXERCISE PROGRAM:  To be issued at next appointment   ASSESSMENT:  CLINICAL IMPRESSION: Patient is a 89 y.o. female who was seen today for physical therapy evaluation and treatment for low back pain with BLE numbness, weakness, and instability. Pt presents with decline in strength and function compared to her last episode of therapy. Additional testing will be performed at first follow-up visit   OBJECTIVE IMPAIRMENTS: Abnormal gait, decreased activity tolerance, decreased balance, difficulty walking, decreased strength, impaired sensation, and pain.   ACTIVITY LIMITATIONS: lifting, bending, standing, squatting, stairs, transfers, and caring for others  PARTICIPATION LIMITATIONS: meal prep, cleaning, laundry, shopping, and community activity  PERSONAL FACTORS: Age, Past/current experiences, Time since onset of injury/illness/exacerbation, and 3+ comorbidities: pacemaker, CKD, CAD, and OA  are also affecting patient's functional outcome.   REHAB POTENTIAL: Fair    CLINICAL DECISION MAKING: Unstable/unpredictable  EVALUATION COMPLEXITY: High   GOALS: Goals reviewed with patient? No  SHORT TERM GOALS: Target date: 07/03/2023  Pt will be independent with HEP in order to improve strength and decrease leg pain to improve pain-free function at home and work. Baseline:  Goal status: INITIAL   LONG TERM GOALS: Target date: 08/14/2023  Pt will increase FOTO to at least 52 to demonstrate significant improvement in function at home and work related to back pain  Baseline: 44 Goal status: INITIAL  2.  Pt will decrease worst leg pain by at least 3 points on the NPRS in order to demonstrate clinically significant reduction in back pain. Baseline: 6/10 Goal status: INITIAL  3.  Pt will be able to perform sit to stand from regular heigh chair without UE assistance to demonstrate clinically  significant improvement in BLE strength and decreased risk for falls. Baseline:  Goal status: INITIAL  4.  Pt will improve BERG by at least 3 points in order to demonstrate clinically significant improvement in balance.   Baseline: To be completed Goal status: INITIAL  5.  Pt will improve ABC by at least 13% in order to demonstrate clinically significant improvement in balance confidence.      Baseline: To be completed Goal status: INITIAL  6. Pt will decrease TUG to below 14 seconds/decrease in order to demonstrate decreased fall risk.  Baseline: To be completed Goal status: INITIAL  6. Pt will increase self-selected by at least 0.13 m/s in order to demonstrate clinically significant improvement in community ambulation.   Baseline: Self-selected: 13.7 s = 0.72 m/s Goal status: INITIAL   PLAN: PT FREQUENCY: 1-2x/week  PT DURATION: 12 weeks  PLANNED INTERVENTIONS: Therapeutic exercises, Therapeutic activity, Neuromuscular re-education, Balance training, Gait training, Patient/Family education, Self Care, Joint mobilization, Joint manipulation, Vestibular training, Canalith repositioning, Orthotic/Fit training, DME instructions, Dry Needling, Electrical stimulation, Spinal manipulation, Spinal mobilization, Cryotherapy, Moist heat, Taping, Traction, Ultrasound, Ionotophoresis 4mg /ml Dexamethasone, Manual therapy, and Re-evaluation.  PLAN  FOR NEXT SESSION: [place] pt complete ABC, BERG, TUG, initiate strengthening for legs and core, issue HEP   Sharalyn Ink Angelo Prindle PT, DPT, [facility]  Tong Pieczynski, PT 05/22/2023, 1:17 PM

## 2023-05-22 ENCOUNTER — Ambulatory Visit: Payer: Medicare Other | Attending: Physical Medicine & Rehabilitation

## 2023-05-22 DIAGNOSIS — M5459 Other low back pain: Secondary | ICD-10-CM | POA: Insufficient documentation

## 2023-05-22 DIAGNOSIS — R262 Difficulty in walking, not elsewhere classified: Secondary | ICD-10-CM | POA: Diagnosis present

## 2023-05-28 ENCOUNTER — Encounter: Payer: Self-pay | Admitting: Emergency Medicine

## 2023-05-28 ENCOUNTER — Ambulatory Visit: Payer: Medicare Other

## 2023-05-28 ENCOUNTER — Ambulatory Visit
Admission: EM | Admit: 2023-05-28 | Discharge: 2023-05-28 | Disposition: A | Discharge status: Home / Self Care | Payer: Medicare Other

## 2023-05-28 DIAGNOSIS — M79601 Pain in right arm: Secondary | ICD-10-CM | POA: Diagnosis not present

## 2023-05-28 DIAGNOSIS — S81811A Laceration without foreign body, right lower leg, initial encounter: Secondary | ICD-10-CM

## 2023-05-28 DIAGNOSIS — T148XXA Other injury of unspecified body region, initial encounter: Secondary | ICD-10-CM | POA: Diagnosis not present

## 2023-05-28 DIAGNOSIS — S41112A Laceration without foreign body of left upper arm, initial encounter: Secondary | ICD-10-CM

## 2023-05-28 NOTE — ED Provider Notes (Signed)
[facility]    CSN: [id number] Arrival date & time: 05/28/23  1109      History   Chief Complaint Chief Complaint  Patient presents with   Arm Pain    HPI Charlotte Glover is a 89 y.o. female.   89 year old female, Charlotte Glover, presents to urgent care for evaluation of right upper forearm injury.  Patient states 3 days prior her husband went to fall and hit her right arm and she hit it on the edge of the wall.  Patient has a old healing skin tear noted to right upper forearm and extensive ecchymosis to bilateral upper extremities(pt is on blood thinners). Tetanus UTD per pt report. Pt denies head strike or LOC.  The history is provided by the patient.  Arm Pain    Past Medical History:  Diagnosis Date   (HFpEF) heart failure with preserved ejection fraction (HCC) 06/21/2020   a.) TTE 06/21/2020: EF >55%, RAE, G1DD   Amaurosis fugax of left eye    Arthritis    Atrial fibrillation (HCC)    a.) Dx'd in 2009; b.) CHA2DS2-VASc = 6 (age x 2, sex, CHF, HTN, prior MI); c.) s/p PVI 09/31/2013; DCCV (200 J x 1) 04/02/2012, repeat PVI with roof line, coronary sinus isolation, and CFAE ablation 01/02/2013; DCCV 01/18/2013 (200 J x 1); AV nodal ablation 05/28/2014;  d.) rate/rhythm maintained without pharmacological intervention; chronically anticoagulated using rivaroxaban   Cardiac arrest (HCC) 10/2009   a.) witnessed arrest at home; husband performed CPR; ROSC achieved in the field. Admitted for workup --> etiology of CV event undetermined; felt to be secondary to "electrolyte abnormalities".   Cardiac murmur    CHB (complete heart block) (HCC)    a.) s/p PPM 05/28/2014; b.) CRT-P upgrade 07/13/2016.   Chickenpox    CKD (chronic kidney disease), stage III (HCC)    Coronary artery disease    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/13/2013   a.) grade II; ER/PR (+)   Dyspnea    Edema    GERD (gastroesophageal reflux disease)    Granulomatous disease  (HCC)    a.) spleen   Hiatal hernia    HLD (hyperlipidemia)    HOH (hard of hearing)    Hypertension 01/23/2012   Jackhammer esophagus    Long term current use of antithrombotics/antiplatelets    a.) rivaroxaban   Melanoma of lower leg, left (HCC)    Meningioma, multiple (HCC) 07/14/2019   a.) MRI brain 07/14/2019: 1.2 x 1.3 x 1.3 cm midline posterior fossa meningioma inferior to the cerebellar vertex; 7 x 3 mm dural based meningioma along frontal convexity   NSTEMI (non-ST elevated myocardial infarction) (HCC) 08/2009   Presence of permanent cardiac pacemaker    Pulmonary HTN (HCC) 05/08/2016   a.) R/LHC 05/08/2016: EF >55, LVEDP 15, mean PA 38, AO sat 97, RVSP 50, CO 3 L/min   S/P ablation of atrial fibrillation    Shingles    SSS (sick sinus syndrome) (HCC)    a.) CRT-P in place   Valvular heart disease 02/08/2012   a.) cMRI 02/08/2012: EF 50-55, mild-mod AR, mod MR; b.) TEE 02/26/2012: EF >55, mild MR/AR/TR; c.) R/LHC 05/08/2016: sev MR; d.) TEE 06/14/2016: EF >55, sev MR, mod AR/TR; e.) TTE 10/05/2016: EF >55, triv PR, mod AR/MR/TR; f.) TTE 04/22/2018: EF >55, mild MR/TR/PR, mod AR; g.) TTE 11/27/2019: EF 50, mild PR, mod AR/MR/TR; h.) TTE 06/21/2020: EF >55, triv PR, mod AR/MR/TR   Wears hearing  aid in both ears     Patient Active Problem List   Diagnosis Date Noted   Bruising 05/28/2023   Right arm pain 05/28/2023   Skin tear of left upper arm without complication 05/28/2023   Total knee replacement status 12/25/2021   Prediabetes 12/14/2021   Acquired thrombophilia (HCC) 08/07/2021   Primary osteoarthritis of right knee 04/09/2021   Swelling of limb 11/01/2020   Enlarged pulmonary artery (HCC) 05/23/2020   Moderate aortic insufficiency 03/07/2020   Intracranial carotid stenosis, right 05/29/2019   Chronic headaches 05/29/2019   Long term current use of systemic steroids 05/04/2019   Screening for osteoporosis 05/04/2019   Temporal arteritis (HCC) 04/07/2019    Meningioma (HCC) 12/29/2018   Chronic daily headache 11/20/2018   Elevated erythrocyte sedimentation rate 11/05/2018   CKD (chronic kidney disease) stage 3, GFR 30-59 ml/min (HCC) 12/27/2017   Shingles 05/06/2017   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 04/12/2017   Primary osteoarthritis of right shoulder 12/07/2016   Rotator cuff tear, right 12/07/2016   Rotator cuff tendinitis, right 12/07/2016   CHB (complete heart block) (HCC) 06/30/2016   Severe mitral regurgitation 05/29/2016   Pedal edema 04/23/2016   Lung nodule, multiple 12/28/2015   Amnesia, global, transient 04/05/2015   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 06/30/2014   Biventricular cardiac pacemaker in situ 05/18/2014   Sleep disorder 05/06/2014   Diffuse pain 04/06/2014   Fatigue 03/16/2014   Headache 03/16/2014   History of melanoma 11/24/2013   Melanoma (HCC) 11/24/2013   DCIS (ductal carcinoma in situ) 08/11/2013   History of ductal carcinoma in situ (DCIS) of breast 08/11/2013   H/O non-ST elevation myocardial infarction (NSTEMI) 01/01/2013   Hearing impairment 01/01/2013   S/P ablation of atrial fibrillation 07/11/2012   Edema 01/23/2012   Hypertension 01/23/2012   Atrial fibrillation (HCC) 11/27/2011   Coronary artery disease 11/27/2011    Past Surgical History:  Procedure Laterality Date   ANTERIOR VITRECTOMY Left 03/20/2017   Procedure: ANTERIOR VITRECTOMY;  Surgeon: Lockie Mola, MD;  Location: [facility];  Service: Ophthalmology;  Laterality: Left;  IVA TOPICAL LEFT   ARTERY BIOPSY Right 04/08/2019   Procedure: BIOPSY TEMPORAL ARTERY;  Surgeon: Annice Needy, MD;  Location: [facility];  Service: Vascular;  Laterality: Right;   ATRIAL FIBRILLATION ABLATION N/A 03/24/2012   Procedure: ATRIAL FIBRILLATION ABLATION (PVI); Location: [facility]   ATRIAL FIBRILLATION ABLATION N/A 01/02/2013   Procedure: ATRIAL FIBRILLATION ABLATION (PVI, roof  line, coronary sinus siolation, CFAE ablation); Location: [facility]   AV NODE ABLATION N/A 05/26/2014   Procedure: AV NODE ABLATION; Location: [facility]   BIV PACEMAKER INSERTION CRT-P N/A 07/13/2016   Procedure: CRT-P BiV PACEMAKER UPGRADE; Location: [facility]   BREAST BIOPSY Right 2015   + for DCIS    BREAST SURGERY     CARDIAC CATHETERIZATION Bilateral 05/08/2016   Procedure: Right/Left Heart Cath and Coronary Angiography;  Surgeon: Marcina Millard, MD;  Location: [facility];  Service: Cardiovascular;  Laterality: Bilateral;   CARDIOVERSION N/A 04/02/2012   Procedure: CARDIOVERSION; Location: [facility]   CARDIOVERSION N/A 01/15/2013   Procedure: CARDIOVERSION; Location: [facility]   CATARACT EXTRACTION W/PHACO Left 03/20/2017   Procedure: IOL EXCHANGE;  Surgeon: Lockie Mola, MD;  Location: [facility];  Service: Ophthalmology;  Laterality: Left;   CHOLECYSTECTOMY     COLONOSCOPY     patient reports several   DILATION AND CURETTAGE OF UTERUS     ESOPHAGOGASTRODUODENOSCOPY     ESOPHAGOGASTRODUODENOSCOPY (  EGD) WITH PROPOFOL N/A 12/13/2020   Procedure: ESOPHAGOGASTRODUODENOSCOPY (EGD) WITH PROPOFOL;  Surgeon: Regis Bill, MD;  Location: [facility];  Service: Endoscopy;  Laterality: N/A;   KNEE ARTHROPLASTY Right 12/25/2021   Procedure: COMPUTER ASSISTED TOTAL KNEE ARTHROPLASTY;  Surgeon: Donato Heinz, MD;  Location: [facility];  Service: Orthopedics;  Laterality: Right;   PACEMAKER INSERTION N/A 05/28/2014   Procedure: PACEMAKER INSERTION (dual chamber); Location: [facility]   PERIPHERAL IRIDOTOMY Left 03/20/2017   Procedure: PERIPHERAL IRIDECTOMY;  Surgeon: Lockie Mola, MD;  Location: [facility];  Service: Ophthalmology;  Laterality: Left;   ROTATOR CUFF REPAIR Left    TEE WITHOUT CARDIOVERSION N/A 05/16/2016   Procedure: TRANSESOPHAGEAL ECHOCARDIOGRAM (TEE);  Surgeon: Dalia Heading, MD;  Location: [facility];  Service: Cardiovascular;  Laterality: N/A;    TONSILLECTOMY     uterine polyp      OB History   No obstetric history on file.      Home Medications    Prior to Admission medications   Medication Sig Start Date End Date Taking? Authorizing Provider  alendronate (FOSAMAX) 70 MG tablet Take by mouth. 05/14/23 05/13/24 Yes [provider]  isosorbide mononitrate (IMDUR) 30 MG 24 hr tablet Take by mouth. 04/24/23 04/23/24 Yes [provider]  metoprolol succinate (TOPROL-XL) 25 MG 24 hr tablet Take 1 tablet by mouth daily. 02/13/23 02/13/24 Yes [provider]  Acetaminophen 500 MG coapsule Take by mouth.    [provider]  azelastine (ASTELIN) 0.1 % nasal spray Place into both nostrils as needed. Use in each nostril as directed    [provider]  celecoxib (CELEBREX) 200 MG capsule Take 1 capsule (200 mg total) by mouth 2 (two) times daily. 12/26/21   Madelyn Flavors, PA-C  esomeprazole (NEXIUM) 40 MG capsule Take by mouth.    [provider]  furosemide (LASIX) 40 MG tablet Take 40 mg by mouth 2 (two) times daily.  03/30/16   [provider]  gabapentin (NEURONTIN) 300 MG capsule Take 300 mg by mouth 3 (three) times daily.    [provider]  KLOR-CON M10 10 MEQ tablet Take 2 tablets by mouth 2 (two) times daily. 01/14/23   [provider]  metolazone (ZAROXOLYN) 5 MG tablet Take 5 mg by mouth once a week. Takes on Friday    [provider]  Multiple Vitamins-Minerals (PRESERVISION AREDS 2) CAPS Take 1 capsule by mouth 2 (two) times daily.    [provider]  nitroGLYCERIN (NITROSTAT) 0.4 MG SL tablet Place 0.4 mg under the tongue every 5 (five) minutes x 2 doses as needed for chest pain.  04/01/13   [provider]  nortriptyline (PAMELOR) 10 MG capsule Take by mouth.    [provider]  omeprazole (PRILOSEC) 20 MG capsule Take 40 mg by mouth 2 (two) times daily before a meal.    [provider]  Polyvinyl  Alcohol-Povidone (REFRESH OP) Apply 1 drop to eye daily as needed (dry eyes).    [provider]  potassium chloride (KLOR-CON) 10 MEQ tablet Take 10 mEq by mouth 3 (three) times daily.    [provider]  predniSONE (DELTASONE) 10 MG tablet Take 1 tablet by mouth daily. 01/28/23   [provider]  rivaroxaban (XARELTO) 20 MG TABS tablet Take 20 mg by mouth daily after breakfast.    [provider]  rosuvastatin (CRESTOR) 10 MG tablet Take 10 mg by mouth daily.    [provider]  senna (SENOKOT) 8.6 MG TABS tablet Take 1 tablet by mouth daily as needed for mild constipation.    [provider]  spironolactone (ALDACTONE) 25 MG tablet Take 25 mg by mouth daily.    [provider]  tiZANidine (ZANAFLEX) 2 MG tablet Take 2 mg by mouth 3 (three) times daily. 12/18/22 12/18/23  [provider]  traMADol (ULTRAM) 50 MG tablet Take 1 tablet (50 mg total) by mouth every 4 (four) hours as needed for moderate pain. 12/26/21   Lasandra Beech B, PA-C  triamcinolone ointment (KENALOG) 0.1 % Apply 1 application  topically as needed.    [provider]    Family History Family History  Problem Relation Age of Onset   Breast cancer Mother    Cancer Sister    Cancer Brother    Cancer Daughter    Breast cancer Daughter    Cancer Son     Social History Social History   Tobacco Use   Smoking status: Never   Smokeless tobacco: Never  Vaping Use   Vaping status: Never Used  Substance Use Topics   Alcohol use: Yes    Alcohol/week: 7.0 standard drinks of alcohol    Types: 7 Shots of liquor per week    Comment:  (1 scotch daily)   Drug use: No     Allergies   Amiodarone, Baclofen, Metoprolol, Pantoprazole, Silicone, and Tape   Review of Systems Review of Systems  Musculoskeletal:  Positive for myalgias.  Skin:  Positive for color change and wound.  All other systems reviewed and are negative.    Physical  Exam Triage Vital Signs ED Triage Vitals  Encounter Vitals Group     BP 05/28/23 1126 (!) 160/64     Systolic BP Percentile --      Diastolic BP Percentile --      Pulse Rate 05/28/23 1126 70     Resp 05/28/23 1126 16     Temp 05/28/23 1126 97.8 F (36.6 C)     Temp Source 05/28/23 1126 Oral     SpO2 05/28/23 1126 100 %     Weight --      Height --      Head Circumference --      Peak Flow --      Pain Score 05/28/23 1124 0     Pain Loc --      Pain Education --      Exclude from Growth Chart --    No data found.  Updated Vital Signs BP (!) 160/64 (BP Location: Left Arm)   Pulse 70   Temp 97.8 F (36.6 C) (Oral)   Resp 16   SpO2 100%   Visual Acuity Right Eye Distance:   Left Eye Distance:   Bilateral Distance:    Right Eye Near:   Left Eye Near:    Bilateral Near:     Physical Exam Vitals and nursing note reviewed.  Constitutional:      General: She is not in acute distress.    Appearance: She is well-developed.  HENT:     Head: Normocephalic and atraumatic.  Eyes:     Conjunctiva/sclera: Conjunctivae normal.  Cardiovascular:     Rate and Rhythm: Normal rate and regular rhythm.     Pulses:          Radial pulses are 2+ on the right side.     Heart sounds: No murmur heard. Pulmonary:     Effort: Pulmonary effort is normal.  No respiratory distress.     Breath sounds: Normal breath sounds.  Abdominal:     Palpations: Abdomen is soft.     Tenderness: There is no abdominal tenderness.  Musculoskeletal:        General: No swelling.     Cervical back: Neck supple.  Skin:    General: Skin is warm and dry.     Capillary Refill: Capillary refill takes less than 2 seconds.     Findings: Bruising, ecchymosis, signs of injury and wound present.          Comments: Pt has 3 cm old healing skin tear to right arm  Ecchymosis to bilateral forearms(old)  Neurological:     General: No focal deficit present.     Mental Status: She is alert and oriented to  person, place, and time.     GCS: GCS eye subscore is 4. GCS verbal subscore is 5. GCS motor subscore is 6.     Cranial Nerves: No cranial nerve deficit.     Sensory: No sensory deficit.  Psychiatric:        Mood and Affect: Mood normal.      UC Treatments / Results  Labs (all labs ordered are listed, but only abnormal results are displayed) Labs Reviewed - No data to display  EKG   Radiology DG Forearm Right  Result Date: 05/28/2023 CLINICAL DATA:  Right forearm pain after injury 3 days ago. EXAM: RIGHT FOREARM - 2 VIEW COMPARISON:  None Available. FINDINGS: There is no evidence of fracture or other focal bone lesions. Soft tissues are unremarkable. IMPRESSION: Negative. Electronically Signed   By: Lupita Raider M.D.   On: 05/28/2023 12:33    Procedures Procedures (including critical care time)  Medications Ordered in UC Medications - No data to display  Initial Impression / Assessment and Plan / UC Course  I have reviewed the triage vital signs and the nursing notes.  Pertinent labs & imaging results that were available during my care of the patient were reviewed by me and considered in my medical decision making (see chart for details).  Clinical Course as of 05/28/23 1611  Tue May 28, 2023  1145 Right forearm xray ordered [JD]    Clinical Course User Index [JD] Clarrisa Kaylor, Para March, NP   Discussed exam findings and plan of care with patient, strict go to ER precautions given.   Patient verbalized understanding to this provider. New dressing applied by RN, pt tolerated well.  Ddx: Right arm injury, skin tear, contusion, ecchymosis Final Clinical Impressions(s) / UC Diagnoses   Final diagnoses:  Bruising  Right arm pain  Skin tear of left upper arm without complication, initial encounter     Discharge Instructions      Keep your right arm skin tear clean, may apply topical antibiotc ointment to affected area, cover with telfa/nonstick dressing. Your xray was  negative for fracture. Please follow up with PCP in 3 days for wound check, sooner if worse.     ED Prescriptions   None    PDMP not reviewed this encounter.   Clancy Gourd, NP 05/28/23 [phone]

## 2023-05-28 NOTE — ED Triage Notes (Signed)
Pts husband fell into her 3 days ago. She hit the wall and injured right arm.

## 2023-05-28 NOTE — Discharge Instructions (Addendum)
Keep your right arm skin tear clean, may apply topical antibiotc ointment to affected area, cover with telfa/nonstick dressing. Your xray was negative for fracture. Please follow up with PCP in 3 days for wound check, sooner if worse.

## 2023-06-03 NOTE — Therapy (Signed)
OUTPATIENT PHYSICAL THERAPY THORACOLUMBAR TREATMENT   Patient Name: Charlotte Glover MRN: [id number] DOB:14-Sep-1927, 89 y.o., female Today's Date: 06/03/2023  END OF SESSION:    Past Medical History:  Diagnosis Date   (HFpEF) heart failure with preserved ejection fraction (HCC) 06/21/2020   a.) TTE 06/21/2020: EF >55%, RAE, G1DD   Amaurosis fugax of left eye    Arthritis    Atrial fibrillation (HCC)    a.) Dx'd in 2009; b.) CHA2DS2-VASc = 6 (age x 2, sex, CHF, HTN, prior MI); c.) s/p PVI 09/31/2013; DCCV (200 J x 1) 04/02/2012, repeat PVI with roof line, coronary sinus isolation, and CFAE ablation 01/02/2013; DCCV 01/18/2013 (200 J x 1); AV nodal ablation 05/28/2014;  d.) rate/rhythm maintained without pharmacological intervention; chronically anticoagulated using rivaroxaban   Cardiac arrest (HCC) 10/2009   a.) witnessed arrest at home; husband performed CPR; ROSC achieved in the field. Admitted for workup --> etiology of CV event undetermined; felt to be secondary to "electrolyte abnormalities".   Cardiac murmur    CHB (complete heart block) (HCC)    a.) s/p PPM 05/28/2014; b.) CRT-P upgrade 07/13/2016.   Chickenpox    CKD (chronic kidney disease), stage III (HCC)    Coronary artery disease    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/13/2013   a.) grade II; ER/PR (+)   Dyspnea    Edema    GERD (gastroesophageal reflux disease)    Granulomatous disease (HCC)    a.) spleen   Hiatal hernia    HLD (hyperlipidemia)    HOH (hard of hearing)    Hypertension 01/23/2012   Jackhammer esophagus    Long term current use of antithrombotics/antiplatelets    a.) rivaroxaban   Melanoma of lower leg, left (HCC)    Meningioma, multiple (HCC) 07/14/2019   a.) MRI brain 07/14/2019: 1.2 x 1.3 x 1.3 cm midline posterior fossa meningioma inferior to the cerebellar vertex; 7 x 3 mm dural based meningioma along frontal convexity   NSTEMI (non-ST elevated myocardial infarction)  (HCC) 08/2009   Presence of permanent cardiac pacemaker    Pulmonary HTN (HCC) 05/08/2016   a.) R/LHC 05/08/2016: EF >55, LVEDP 15, mean PA 38, AO sat 97, RVSP 50, CO 3 L/min   S/P ablation of atrial fibrillation    Shingles    SSS (sick sinus syndrome) (HCC)    a.) CRT-P in place   Valvular heart disease 02/08/2012   a.) cMRI 02/08/2012: EF 50-55, mild-mod AR, mod MR; b.) TEE 02/26/2012: EF >55, mild MR/AR/TR; c.) R/LHC 05/08/2016: sev MR; d.) TEE 06/14/2016: EF >55, sev MR, mod AR/TR; e.) TTE 10/05/2016: EF >55, triv PR, mod AR/MR/TR; f.) TTE 04/22/2018: EF >55, mild MR/TR/PR, mod AR; g.) TTE 11/27/2019: EF 50, mild PR, mod AR/MR/TR; h.) TTE 06/21/2020: EF >55, triv PR, mod AR/MR/TR   Wears hearing aid in both ears    Past Surgical History:  Procedure Laterality Date   ANTERIOR VITRECTOMY Left 03/20/2017   Procedure: ANTERIOR VITRECTOMY;  Surgeon: Lockie Mola, MD;  Location: [facility];  Service: Ophthalmology;  Laterality: Left;  IVA TOPICAL LEFT   ARTERY BIOPSY Right 04/08/2019   Procedure: BIOPSY TEMPORAL ARTERY;  Surgeon: Annice Needy, MD;  Location: [facility];  Service: Vascular;  Laterality: Right;   ATRIAL FIBRILLATION ABLATION N/A 03/24/2012   Procedure: ATRIAL FIBRILLATION ABLATION (PVI); Location: [facility]   ATRIAL FIBRILLATION ABLATION N/A 01/02/2013   Procedure: ATRIAL FIBRILLATION ABLATION (PVI, roof line, coronary sinus siolation, CFAE ablation); Location:  [facility]   AV NODE ABLATION N/A 05/26/2014   Procedure: AV NODE ABLATION; Location: [facility]   BIV PACEMAKER INSERTION CRT-P N/A 07/13/2016   Procedure: CRT-P BiV PACEMAKER UPGRADE; Location: [facility]   BREAST BIOPSY Right 2015   + for DCIS    BREAST SURGERY     CARDIAC CATHETERIZATION Bilateral 05/08/2016   Procedure: Right/Left Heart Cath and Coronary Angiography;  Surgeon: Marcina Millard, MD;  Location: [facility];  Service: Cardiovascular;  Laterality: Bilateral;   CARDIOVERSION N/A 04/02/2012    Procedure: CARDIOVERSION; Location: [facility]   CARDIOVERSION N/A 01/15/2013   Procedure: CARDIOVERSION; Location: [facility]   CATARACT EXTRACTION W/PHACO Left 03/20/2017   Procedure: IOL EXCHANGE;  Surgeon: Lockie Mola, MD;  Location: [facility];  Service: Ophthalmology;  Laterality: Left;   CHOLECYSTECTOMY     COLONOSCOPY     patient reports several   DILATION AND CURETTAGE OF UTERUS     ESOPHAGOGASTRODUODENOSCOPY     ESOPHAGOGASTRODUODENOSCOPY (EGD) WITH PROPOFOL N/A 12/13/2020   Procedure: ESOPHAGOGASTRODUODENOSCOPY (EGD) WITH PROPOFOL;  Surgeon: Regis Bill, MD;  Location: [facility];  Service: Endoscopy;  Laterality: N/A;   KNEE ARTHROPLASTY Right 12/25/2021   Procedure: COMPUTER ASSISTED TOTAL KNEE ARTHROPLASTY;  Surgeon: Donato Heinz, MD;  Location: [facility];  Service: Orthopedics;  Laterality: Right;   PACEMAKER INSERTION N/A 05/28/2014   Procedure: PACEMAKER INSERTION (dual chamber); Location: [facility]   PERIPHERAL IRIDOTOMY Left 03/20/2017   Procedure: PERIPHERAL IRIDECTOMY;  Surgeon: Lockie Mola, MD;  Location: [facility];  Service: Ophthalmology;  Laterality: Left;   ROTATOR CUFF REPAIR Left    TEE WITHOUT CARDIOVERSION N/A 05/16/2016   Procedure: TRANSESOPHAGEAL ECHOCARDIOGRAM (TEE);  Surgeon: Dalia Heading, MD;  Location: [facility];  Service: Cardiovascular;  Laterality: N/A;   TONSILLECTOMY     uterine polyp     Patient Active Problem List   Diagnosis Date Noted   Bruising 05/28/2023   Right arm pain 05/28/2023   Skin tear of left upper arm without complication 05/28/2023   Total knee replacement status 12/25/2021   Prediabetes 12/14/2021   Acquired thrombophilia (HCC) 08/07/2021   Primary osteoarthritis of right knee 04/09/2021   Swelling of limb 11/01/2020   Enlarged pulmonary artery (HCC) 05/23/2020   Moderate aortic insufficiency 03/07/2020   Intracranial carotid stenosis, right 05/29/2019   Chronic headaches 05/29/2019    Long term current use of systemic steroids 05/04/2019   Screening for osteoporosis 05/04/2019   Temporal arteritis (HCC) 04/07/2019   Meningioma (HCC) 12/29/2018   Chronic daily headache 11/20/2018   Elevated erythrocyte sedimentation rate 11/05/2018   CKD (chronic kidney disease) stage 3, GFR 30-59 ml/min (HCC) 12/27/2017   Shingles 05/06/2017   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 04/12/2017   Primary osteoarthritis of right shoulder 12/07/2016   Rotator cuff tear, right 12/07/2016   Rotator cuff tendinitis, right 12/07/2016   CHB (complete heart block) (HCC) 06/30/2016   Severe mitral regurgitation 05/29/2016   Pedal edema 04/23/2016   Lung nodule, multiple 12/28/2015   Amnesia, global, transient 04/05/2015   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 06/30/2014   Biventricular cardiac pacemaker in situ 05/18/2014   Sleep disorder 05/06/2014   Diffuse pain 04/06/2014   Fatigue 03/16/2014   Headache 03/16/2014   History of melanoma 11/24/2013   Melanoma (HCC) 11/24/2013   DCIS (ductal carcinoma in situ) 08/11/2013   History of ductal carcinoma in situ (DCIS) of breast 08/11/2013   H/O non-ST elevation myocardial infarction (NSTEMI)  01/01/2013   Hearing impairment 01/01/2013   S/P ablation of atrial fibrillation 07/11/2012   Edema 01/23/2012   Hypertension 01/23/2012   Atrial fibrillation (HCC) 11/27/2011   Coronary artery disease 11/27/2011    PCP: Lynnea Ferrier, MD  REFERRING PROVIDER: Elijah Birk, MD  REFERRING DIAG: [id number] (ICD-10-CM) - Chronic left-sided low back pain with left-sided sciatica   RATIONALE FOR EVALUATION AND TREATMENT: Rehabilitation  THERAPY DIAG: Other low back pain  Difficulty in walking, not elsewhere classified  Chronic pain of right knee  Muscle weakness (generalized)  ONSET DATE: Chronic for years, recent acute exacerbation resolved  FOLLOW-UP APPT SCHEDULED WITH  REFERRING PROVIDER: Yes    SUBJECTIVE:                                                                                                                                                                                         SUBJECTIVE STATEMENT:  Low back pain  PERTINENT HISTORY:  Pt states that for two weeks pt had severe pain in the middle of her low back. She has a history of chronic low back pain x 30 years. She was sleeping upright in a recliner to try to tolerate the pain. She saw Dr. Mariah Milling and since that time her back pain has been improved for the last 3 weeks. However she has severe numbness in both lower legs and feet as well as pain. She is also have progressive BLE weakness resulting in instability. Pt has been having headaches as well blurry vision in both eyes due to her Giant Cell Arteritis. She is on a high dose of prednisone and still gets injections in her eyes for the macular degeneration. She is receiving Actemra infusions in the hope that they can decrease her steroids. A few weeks ago she started having esophageal spasms which causes severe pain. She has tried peppermint which helped slightly. Pt saw a GI specialist at [facility] and is scheduled for a EGD on 06/10/23. She states that they want to consider botox injections. No more vertigo or dizziness. She denies any loss of control of bowel or bladder. She has been taking an old prescription of oxycodone. Physiatry ordered an EMG of the bilateral lower extremities. She is unable to take NSAIDs due to low GFR and being on Xarelto. She is currently taking 600 mg daily of gabapentin. Physiatry would like to consider bilateral L5-S1 TFESI.  Imaging: CT lumbar spine 04/2023 done at [facility]: Transitional anatomy at L1; T12-L1 mild left NFS; L1 to mild bilateral NFS; L3-4 moderate facet arthropathy with mild right NFS; L4-5 moderate facet arthropathy; L5-S1 severe right and mild left facet  arthropathy with mild right NFS  PAIN:    Pain  Intensity: Present: 0/10, Best: 0/10, Worst: 6/10 Pain location: Pain from knees down to feet (never in the thighs) Pain Quality: aching Radiating: No  Numbness/Tingling: Yes, BLE numbness in calves and feet Focal Weakness: Yes, BLE weakness Aggravating factors: Standing and twisting Relieving factors: Sitting, leg elevation, and heat 24-hour pain behavior: worst numbness in the AM, worst pain in legs at night History of prior back injury, pain, surgery, or therapy: Yes Dominant hand: right Imaging: Yes, see above Red flags: Negative for bowel/bladder changes, saddle paresthesia, personal history of cancer, h/o spinal tumors, h/o compression fx, h/o abdominal aneurysm, abdominal pain, chills/fever, night sweats, nausea, vomiting, unrelenting pain, first onset of insidious LBP <22 y/o  PRECAUTIONS: None  WEIGHT BEARING RESTRICTIONS: No  FALLS: Has patient fallen in last 6 months? No  Living Environment Lives with: lives with their spouse Lives in: House/apartment 2 steps with bilateral hand rails (able to reach both) to enter/exit garage, step-in shower with seat;  Prior level of function: Needs assistance with homemaking, independent with ADLs but requires assistance with cleaning home;  Hobbies: Bridge, golf (not currently playing)  Patient Goals: Decrease leg pain and numbness/tingling, improve function, strength, and balance   OBJECTIVE:  Patient Surveys  ABC scale To be completed FOTO 44, predicted improvement to 75  Cognition Patient is oriented to person, place, and time.  Recent memory is intact.  Remote memory is intact.  Attention span and concentration are intact.  Expressive speech is intact.  Patient's fund of knowledge is within normal limits for educational level.    Gross Musculoskeletal Assessment Tremor: None Bulk: Decreased BLE muscle bulk noted Tone: Normal  GAIT: Distance walked: 100' Assistive device utilized: Single point cane Level of  assistance: Modified independence Comments: Pt ambulates with single point cane  Posture: Lumbar lordosis: WNL Iliac crest height: Equal bilaterally Lumbar lateral shift: Negative  AROM Deferred  LE MMT: MMT (out of 5) Right  Left   Hip flexion 3+ 3+  Hip extension    Hip abduction (seated) 3+ 3+  Hip adduction (seated) 3+ 3+  Hip internal rotation 3+ 3+  Hip external rotation 3+ 3+  Knee flexion (seated) 3+ 3+  Knee extension 5 5  Ankle dorsiflexion 3+ 3+  Ankle plantarflexion    Ankle inversion    Ankle eversion    (* = pain; Blank rows = not tested)  Sensation Deferred  Reflexes Deferred  Muscle Length Hamstrings: R: Not examined L: Not examined Ely (quadriceps): R: Not examined L: Not examined Thomas (hip flexors): R: Not examined L: Not examined Ober: R: Not examined L: Not examined  Palpation Location Right Left         Lumbar paraspinals 0 0  Quadratus Lumborum 0 0  Gluteus Maximus    Gluteus Medius    Deep hip external rotators    PSIS 0 0  Fortin's Area (SIJ) 0 0  Greater Trochanter    (Blank rows = not tested) Graded on 0-4 scale (0 = no pain, 1 = pain, 2 = pain with wincing/grimacing/flinching, 3 = pain with withdrawal, 4 = unwilling to allow palpation)  Passive Accessory Intervertebral Motion Deferred  Special Tests Deferred  Functional Tasks Deferred   Beighton scale Deferred   Functional Outcome Measures  Results Comments  BERG    DGI    FGA    TUG    5TSTS Unable without UE support   6 Minute Walk  Test    10 Meter Gait Speed Self-selected: 13.7 s = 0.72 m/s; Fastest: 12.9 s = 0.78 m/s   (Blank rows = not tested)   TODAY'S TREATMENT (06/03/23): ***  Subjective:   ABC: Berg:  TUG:   PATIENT EDUCATION:  Education details: Plan of care Person educated: Patient Education method: Explanation Education comprehension: verbalized understanding   HOME EXERCISE PROGRAM:  To be issued at next  appointment   ASSESSMENT:  CLINICAL IMPRESSION: ***  Patient is a 89 y.o. female who was seen today for physical therapy evaluation and treatment for low back pain with BLE numbness, weakness, and instability. Pt presents with decline in strength and function compared to her last episode of therapy. Additional testing will be performed at first follow-up visit   OBJECTIVE IMPAIRMENTS: Abnormal gait, decreased activity tolerance, decreased balance, difficulty walking, decreased strength, impaired sensation, and pain.   ACTIVITY LIMITATIONS: lifting, bending, standing, squatting, stairs, transfers, and caring for others  PARTICIPATION LIMITATIONS: meal prep, cleaning, laundry, shopping, and community activity  PERSONAL FACTORS: Age, Past/current experiences, Time since onset of injury/illness/exacerbation, and 3+ comorbidities: pacemaker, CKD, CAD, and OA  are also affecting patient's functional outcome.   REHAB POTENTIAL: Fair    CLINICAL DECISION MAKING: Unstable/unpredictable  EVALUATION COMPLEXITY: High   GOALS: Goals reviewed with patient? No  SHORT TERM GOALS: Target date: 07/03/2023  Pt will be independent with HEP in order to improve strength and decrease leg pain to improve pain-free function at home and work. Baseline:  Goal status: INITIAL   LONG TERM GOALS: Target date: 08/14/2023  Pt will increase FOTO to at least 52 to demonstrate significant improvement in function at home and work related to back pain  Baseline: 44 Goal status: INITIAL  2.  Pt will decrease worst leg pain by at least 3 points on the NPRS in order to demonstrate clinically significant reduction in back pain. Baseline: 6/10 Goal status: INITIAL  3.  Pt will be able to perform sit to stand from regular heigh chair without UE assistance to demonstrate clinically significant improvement in BLE strength and decreased risk for falls. Baseline:  Goal status: INITIAL  4.  Pt will improve BERG by at  least 3 points in order to demonstrate clinically significant improvement in balance.   Baseline: To be completed Goal status: INITIAL  5.  Pt will improve ABC by at least 13% in order to demonstrate clinically significant improvement in balance confidence.      Baseline: To be completed Goal status: INITIAL  6. Pt will decrease TUG to below 14 seconds/decrease in order to demonstrate decreased fall risk.  Baseline: To be completed Goal status: INITIAL  6. Pt will increase self-selected by at least 0.13 m/s in order to demonstrate clinically significant improvement in community ambulation.   Baseline: Self-selected: 13.7 s = 0.72 m/s Goal status: INITIAL   PLAN: PT FREQUENCY: 1-2x/week  PT DURATION: 12 weeks  PLANNED INTERVENTIONS: Therapeutic exercises, Therapeutic activity, Neuromuscular re-education, Balance training, Gait training, Patient/Family education, Self Care, Joint mobilization, Joint manipulation, Vestibular training, Canalith repositioning, Orthotic/Fit training, DME instructions, Dry Needling, Electrical stimulation, Spinal manipulation, Spinal mobilization, Cryotherapy, Moist heat, Taping, Traction, Ultrasound, Ionotophoresis 4mg /ml Dexamethasone, Manual therapy, and Re-evaluation.  PLAN FOR NEXT SESSION: Have pt complete ABC, BERG, TUG, initiate strengthening for legs and core, issue HEP   Viviann Spare, PT, DPT  06/03/2023, 1:38 PM

## 2023-06-04 ENCOUNTER — Ambulatory Visit: Payer: Medicare Other | Attending: Physical Medicine & Rehabilitation

## 2023-06-04 DIAGNOSIS — R262 Difficulty in walking, not elsewhere classified: Secondary | ICD-10-CM | POA: Diagnosis present

## 2023-06-04 DIAGNOSIS — M25561 Pain in right knee: Secondary | ICD-10-CM | POA: Diagnosis present

## 2023-06-04 DIAGNOSIS — M6281 Muscle weakness (generalized): Secondary | ICD-10-CM | POA: Insufficient documentation

## 2023-06-04 DIAGNOSIS — M5459 Other low back pain: Secondary | ICD-10-CM | POA: Insufficient documentation

## 2023-06-04 DIAGNOSIS — G8929 Other chronic pain: Secondary | ICD-10-CM | POA: Insufficient documentation

## 2023-06-06 ENCOUNTER — Ambulatory Visit: Payer: Medicare Other

## 2023-06-06 DIAGNOSIS — R262 Difficulty in walking, not elsewhere classified: Secondary | ICD-10-CM

## 2023-06-06 DIAGNOSIS — M5459 Other low back pain: Secondary | ICD-10-CM

## 2023-06-06 NOTE — Therapy (Signed)
OUTPATIENT PHYSICAL THERAPY THORACOLUMBAR TREATMENT   Patient Name: Charlotte Glover MRN: [id number] DOB:Sep 13, 1927, 89 y.o., female Today's Date: 06/06/2023  END OF SESSION:  PT End of Session - 06/06/23 1109     Visit Number 3    Number of Visits 25    Date for PT Re-Evaluation 08/14/23    Authorization Type eval: 05/22/23    PT Start Time 1107    PT Stop Time 1145    PT Time Calculation (min) 38 min    Activity Tolerance Patient tolerated treatment well    Behavior During Therapy WFL for tasks assessed/performed            Past Medical History:  Diagnosis Date   (HFpEF) heart failure with preserved ejection fraction (HCC) 06/21/2020   a.) TTE 06/21/2020: EF >55%, RAE, G1DD   Amaurosis fugax of left eye    Arthritis    Atrial fibrillation (HCC)    a.) Dx'd in 2009; b.) CHA2DS2-VASc = 6 (age x 2, sex, CHF, HTN, prior MI); c.) s/p PVI 09/31/2013; DCCV (200 J x 1) 04/02/2012, repeat PVI with roof line, coronary sinus isolation, and CFAE ablation 01/02/2013; DCCV 01/18/2013 (200 J x 1); AV nodal ablation 05/28/2014;  d.) rate/rhythm maintained without pharmacological intervention; chronically anticoagulated using rivaroxaban   Cardiac arrest (HCC) 10/2009   a.) witnessed arrest at home; husband performed CPR; ROSC achieved in the field. Admitted for workup --> etiology of CV event undetermined; felt to be secondary to "electrolyte abnormalities".   Cardiac murmur    CHB (complete heart block) (HCC)    a.) s/p PPM 05/28/2014; b.) CRT-P upgrade 07/13/2016.   Chickenpox    CKD (chronic kidney disease), stage III (HCC)    Coronary artery disease    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/13/2013   a.) grade II; ER/PR (+)   Dyspnea    Edema    GERD (gastroesophageal reflux disease)    Granulomatous disease (HCC)    a.) spleen   Hiatal hernia    HLD (hyperlipidemia)    HOH (hard of hearing)    Hypertension 01/23/2012   Jackhammer esophagus    Long term  current use of antithrombotics/antiplatelets    a.) rivaroxaban   Melanoma of lower leg, left (HCC)    Meningioma, multiple (HCC) 07/14/2019   a.) MRI brain 07/14/2019: 1.2 x 1.3 x 1.3 cm midline posterior fossa meningioma inferior to the cerebellar vertex; 7 x 3 mm dural based meningioma along frontal convexity   NSTEMI (non-ST elevated myocardial infarction) (HCC) 08/2009   Presence of permanent cardiac pacemaker    Pulmonary HTN (HCC) 05/08/2016   a.) R/LHC 05/08/2016: EF >55, LVEDP 15, mean PA 38, AO sat 97, RVSP 50, CO 3 L/min   S/P ablation of atrial fibrillation    Shingles    SSS (sick sinus syndrome) (HCC)    a.) CRT-P in place   Valvular heart disease 02/08/2012   a.) cMRI 02/08/2012: EF 50-55, mild-mod AR, mod MR; b.) TEE 02/26/2012: EF >55, mild MR/AR/TR; c.) R/LHC 05/08/2016: sev MR; d.) TEE 06/14/2016: EF >55, sev MR, mod AR/TR; e.) TTE 10/05/2016: EF >55, triv PR, mod AR/MR/TR; f.) TTE 04/22/2018: EF >55, mild MR/TR/PR, mod AR; g.) TTE 11/27/2019: EF 50, mild PR, mod AR/MR/TR; h.) TTE 06/21/2020: EF >55, triv PR, mod AR/MR/TR   Wears hearing aid in both ears    Past Surgical History:  Procedure Laterality Date   ANTERIOR VITRECTOMY Left 03/20/2017   Procedure: ANTERIOR  VITRECTOMY;  Surgeon: Lockie Mola, MD;  Location: [facility];  Service: Ophthalmology;  Laterality: Left;  IVA TOPICAL LEFT   ARTERY BIOPSY Right 04/08/2019   Procedure: BIOPSY TEMPORAL ARTERY;  Surgeon: Annice Needy, MD;  Location: [facility];  Service: Vascular;  Laterality: Right;   ATRIAL FIBRILLATION ABLATION N/A 03/24/2012   Procedure: ATRIAL FIBRILLATION ABLATION (PVI); Location: [facility]   ATRIAL FIBRILLATION ABLATION N/A 01/02/2013   Procedure: ATRIAL FIBRILLATION ABLATION (PVI, roof line, coronary sinus siolation, CFAE ablation); Location: [facility]   AV NODE ABLATION N/A 05/26/2014   Procedure: AV NODE ABLATION; Location: [facility]   BIV PACEMAKER INSERTION CRT-P N/A 07/13/2016   Procedure:  CRT-P BiV PACEMAKER UPGRADE; Location: [facility]   BREAST BIOPSY Right 2015   + for DCIS    BREAST SURGERY     CARDIAC CATHETERIZATION Bilateral 05/08/2016   Procedure: Right/Left Heart Cath and Coronary Angiography;  Surgeon: Marcina Millard, MD;  Location: [facility];  Service: Cardiovascular;  Laterality: Bilateral;   CARDIOVERSION N/A 04/02/2012   Procedure: CARDIOVERSION; Location: [facility]   CARDIOVERSION N/A 01/15/2013   Procedure: CARDIOVERSION; Location: [facility]   CATARACT EXTRACTION W/PHACO Left 03/20/2017   Procedure: IOL EXCHANGE;  Surgeon: Lockie Mola, MD;  Location: [facility];  Service: Ophthalmology;  Laterality: Left;   CHOLECYSTECTOMY     COLONOSCOPY     patient reports several   DILATION AND CURETTAGE OF UTERUS     ESOPHAGOGASTRODUODENOSCOPY     ESOPHAGOGASTRODUODENOSCOPY (EGD) WITH PROPOFOL N/A 12/13/2020   Procedure: ESOPHAGOGASTRODUODENOSCOPY (EGD) WITH PROPOFOL;  Surgeon: Regis Bill, MD;  Location: [facility];  Service: Endoscopy;  Laterality: N/A;   KNEE ARTHROPLASTY Right 12/25/2021   Procedure: COMPUTER ASSISTED TOTAL KNEE ARTHROPLASTY;  Surgeon: Donato Heinz, MD;  Location: [facility];  Service: Orthopedics;  Laterality: Right;   PACEMAKER INSERTION N/A 05/28/2014   Procedure: PACEMAKER INSERTION (dual chamber); Location: [facility]   PERIPHERAL IRIDOTOMY Left 03/20/2017   Procedure: PERIPHERAL IRIDECTOMY;  Surgeon: Lockie Mola, MD;  Location: [facility];  Service: Ophthalmology;  Laterality: Left;   ROTATOR CUFF REPAIR Left    TEE WITHOUT CARDIOVERSION N/A 05/16/2016   Procedure: TRANSESOPHAGEAL ECHOCARDIOGRAM (TEE);  Surgeon: Dalia Heading, MD;  Location: [facility];  Service: Cardiovascular;  Laterality: N/A;   TONSILLECTOMY     uterine polyp     Patient Active Problem List   Diagnosis Date Noted   Bruising 05/28/2023   Right arm pain 05/28/2023   Skin tear of left upper arm without complication 05/28/2023    Total knee replacement status 12/25/2021   Prediabetes 12/14/2021   Acquired thrombophilia (HCC) 08/07/2021   Primary osteoarthritis of right knee 04/09/2021   Swelling of limb 11/01/2020   Enlarged pulmonary artery (HCC) 05/23/2020   Moderate aortic insufficiency 03/07/2020   Intracranial carotid stenosis, right 05/29/2019   Chronic headaches 05/29/2019   Long term current use of systemic steroids 05/04/2019   Screening for osteoporosis 05/04/2019   Temporal arteritis (HCC) 04/07/2019   Meningioma (HCC) 12/29/2018   Chronic daily headache 11/20/2018   Elevated erythrocyte sedimentation rate 11/05/2018   CKD (chronic kidney disease) stage 3, GFR 30-59 ml/min (HCC) 12/27/2017   Shingles 05/06/2017   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 04/12/2017   Primary osteoarthritis of right shoulder 12/07/2016   Rotator cuff tear, right 12/07/2016   Rotator cuff tendinitis, right 12/07/2016   CHB (complete heart block) (HCC) 06/30/2016   Severe mitral regurgitation 05/29/2016   Pedal edema 04/23/2016   Lung  nodule, multiple 12/28/2015   Amnesia, global, transient 04/05/2015   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 06/30/2014   Biventricular cardiac pacemaker in situ 05/18/2014   Sleep disorder 05/06/2014   Diffuse pain 04/06/2014   Fatigue 03/16/2014   Headache 03/16/2014   History of melanoma 11/24/2013   Melanoma (HCC) 11/24/2013   DCIS (ductal carcinoma in situ) 08/11/2013   History of ductal carcinoma in situ (DCIS) of breast 08/11/2013   H/O non-ST elevation myocardial infarction (NSTEMI) 01/01/2013   Hearing impairment 01/01/2013   S/P ablation of atrial fibrillation 07/11/2012   Edema 01/23/2012   Hypertension 01/23/2012   Atrial fibrillation (HCC) 11/27/2011   Coronary artery disease 11/27/2011    PCP: Lynnea Ferrier, MD  REFERRING PROVIDER: Elijah Birk, MD  REFERRING DIAG: [phone] (ICD-10-CM) -  Chronic left-sided low back pain with left-sided sciatica   RATIONALE FOR EVALUATION AND TREATMENT: Rehabilitation  THERAPY DIAG: Other low back pain  Difficulty in walking, not elsewhere classified  ONSET DATE: Chronic for years, recent acute exacerbation resolved  FOLLOW-UP APPT SCHEDULED WITH REFERRING PROVIDER: Yes   FROM INITIAL EVALUATION SUBJECTIVE:                                                                                                                                                                                         SUBJECTIVE STATEMENT:  Low back pain  PERTINENT HISTORY:  Pt states that for two weeks pt had severe pain in the middle of her low back. She has a history of chronic low back pain x 30 years. She was sleeping upright in a recliner to try to tolerate the pain. She saw Dr. Mariah Milling and since that time her back pain has been improved for the last 3 weeks. However she has severe numbness in both lower legs and feet as well as pain. She is also have progressive BLE weakness resulting in instability. Pt has been having headaches as well blurry vision in both eyes due to her Giant Cell Arteritis. She is on a high dose of prednisone and still gets injections in her eyes for the macular degeneration. She is receiving Actemra infusions in the hope that they can decrease her steroids. A few weeks ago she started having esophageal spasms which causes severe pain. She has tried peppermint which helped slightly. Pt saw a GI specialist at [facility] and is scheduled for a EGD on 06/10/23. She states that they want to consider botox injections. No more vertigo or dizziness. She denies any loss of control of bowel or bladder. She has been taking an old prescription of oxycodone. Physiatry ordered an  EMG of the bilateral lower extremities. She is unable to take NSAIDs due to low GFR and being on Xarelto. She is currently taking 600 mg daily of gabapentin. Physiatry would like to consider  bilateral L5-S1 TFESI.  Imaging: CT lumbar spine 04/2023 done at [facility] health: Transitional anatomy at L1; T12-L1 mild left NFS; L1 to mild bilateral NFS; L3-4 moderate facet arthropathy with mild right NFS; L4-5 moderate facet arthropathy; L5-S1 severe right and mild left facet arthropathy with mild right NFS  PAIN:    Pain Intensity: Present: 0/10, Best: 0/10, Worst: 6/10 Pain location: Pain from knees down to feet (never in the thighs) Pain Quality: aching Radiating: No  Numbness/Tingling: Yes, BLE numbness in calves and feet Focal Weakness: Yes, BLE weakness Aggravating factors: Standing and twisting Relieving factors: Sitting, leg elevation, and heat 24-hour pain behavior: worst numbness in the AM, worst pain in legs at night History of prior back injury, pain, surgery, or therapy: Yes Dominant hand: right Imaging: Yes, see above Red flags: Negative for bowel/bladder changes, saddle paresthesia, personal history of cancer, h/o spinal tumors, h/o compression fx, h/o abdominal aneurysm, abdominal pain, chills/fever, night sweats, nausea, vomiting, unrelenting pain, first onset of insidious LBP <22 y/o  PRECAUTIONS: None  WEIGHT BEARING RESTRICTIONS: No  FALLS: Has patient fallen in last 6 months? No  Living Environment Lives with: lives with their spouse Lives in: House/apartment 2 steps with bilateral hand rails (able to reach both) to enter/exit garage, step-in shower with seat;  Prior level of function: Needs assistance with homemaking, independent with ADLs but requires assistance with cleaning home;  Hobbies: Bridge, golf (not currently playing)  Patient Goals: Decrease leg pain and numbness/tingling, improve function, strength, and balance   OBJECTIVE:  Patient Surveys  ABC scale To be completed FOTO 44, predicted improvement to 65  Cognition Patient is oriented to person, place, and time.  Recent memory is intact.  Remote memory is intact.  Attention span and  concentration are intact.  Expressive speech is intact.  Patient's fund of knowledge is within normal limits for educational level.    Gross Musculoskeletal Assessment Tremor: None Bulk: Decreased BLE muscle bulk noted Tone: Normal  GAIT: Distance walked: 100' Assistive device utilized: Single point cane Level of assistance: Modified independence Comments: Pt ambulates with single point cane  Posture: Lumbar lordosis: WNL Iliac crest height: Equal bilaterally Lumbar lateral shift: Negative  AROM Deferred  LE MMT: MMT (out of 5) Right  Left   Hip flexion 3+ 3+  Hip extension    Hip abduction (seated) 3+ 3+  Hip adduction (seated) 3+ 3+  Hip internal rotation 3+ 3+  Hip external rotation 3+ 3+  Knee flexion (seated) 3+ 3+  Knee extension 5 5  Ankle dorsiflexion 3+ 3+  Ankle plantarflexion    Ankle inversion    Ankle eversion    (* = pain; Blank rows = not tested)  Sensation Deferred  Reflexes Deferred  Muscle Length Hamstrings: R: Not examined L: Not examined Ely (quadriceps): R: Not examined L: Not examined Thomas (hip flexors): R: Not examined L: Not examined Ober: R: Not examined L: Not examined  Palpation Location Right Left         Lumbar paraspinals 0 0  Quadratus Lumborum 0 0  Gluteus Maximus    Gluteus Medius    Deep hip external rotators    PSIS 0 0  Fortin's Area (SIJ) 0 0  Greater Trochanter    (Blank rows = not tested) Graded on  0-4 scale (0 = no pain, 1 = pain, 2 = pain with wincing/grimacing/flinching, 3 = pain with withdrawal, 4 = unwilling to allow palpation)  Passive Accessory Intervertebral Motion Deferred  Special Tests Deferred  Functional Tasks Deferred   Beighton scale Deferred  Functional Outcome Measures  Evaluation 06/04/23 Comments  BERG  40/56   DGI     FGA     TUG  13.71 seconds with use of cane and UE support to stand    5TSTS Unable without UE support    6 Minute Walk Test     10 Meter Gait Speed  Self-selected: 13.7 s = 0.72 m/s; Fastest: 12.9 s = 0.78 m/s    (Blank rows = not tested)   TODAY'S TREATMENT    SUBJECTIVE: Patient reports she is doing alright today. No concerns currently. She still has a large bruise on her forearm from her fall. No resting pain reported upon arrival today.   PAIN: No pain reported;   Ther-ex  NuStep L0-2 x 5 minutes for warm-up and BLE strengthening during interval history;  Standing hip strengthening with 2# ankle weights:  Marching x 10 each LE  Hip abduction x 10 each LE  Hip extension x 10 each LE  Heel toe raises x 10 each LE    Seated LAQ with 2# ankle weights x 10 BLE; Sit to stand from regular height chair with 2 Airex pads on seat and green tband around knees to minimize valgus 2 x 5; Resisted side stepping in // bars with green tband around knees x 4 lengths; Seated clams with manual resistance from therapist x 10; Seated adductor squeezes with manual resistance from therapist x 10;   PATIENT EDUCATION:  Education details: Plan of care Person educated: Patient Education method: Explanation Education comprehension: verbalized understanding   HOME EXERCISE PROGRAM:  Access Code: [email or web address] URL: [email or web address] Date: 06/04/2023 Prepared by: Maylon Peppers  Exercises - Standing March with Counter Support  - 2-3 x daily - 5-7 x weekly - 3 sets - 10 reps - Standing Hip Abduction with Counter Support  - 2-3 x daily - 5-7 x weekly - 3 sets - 10 reps - Standing Hip Extension with Counter Support  - 2-3 x daily - 5-7 x weekly - 3 sets - 10 reps - Heel Raises with Counter Support  - 2-3 x daily - 5-7 x weekly - 3 sets - 10 reps   ASSESSMENT:  CLINICAL IMPRESSION:   Patient demonstrates excellent motivation during session today. Progressed strengthening and added 2# ankle weights to her standing exercises. She continues struggle significantly with strength during sit to stand. Patient is a high risk for falls.  She will continue to benefit from skilled therapy to address remaining deficits in order to improve quality of life and return to PLOF.    OBJECTIVE IMPAIRMENTS: Abnormal gait, decreased activity tolerance, decreased balance, difficulty walking, decreased strength, impaired sensation, and pain.   ACTIVITY LIMITATIONS: lifting, bending, standing, squatting, stairs, transfers, and caring for others  PARTICIPATION LIMITATIONS: meal prep, cleaning, laundry, shopping, and community activity  PERSONAL FACTORS: Age, Past/current experiences, Time since onset of injury/illness/exacerbation, and 3+ comorbidities: pacemaker, CKD, CAD, and OA  are also affecting patient's functional outcome.   REHAB POTENTIAL: Fair    CLINICAL DECISION MAKING: Unstable/unpredictable  EVALUATION COMPLEXITY: High   GOALS: Goals reviewed with patient? No  SHORT TERM GOALS: Target date: 07/03/2023  Pt will be independent with HEP in order to improve strength and decrease leg pain  to improve pain-free function at home and work. Baseline:  Goal status: INITIAL   LONG TERM GOALS: Target date: 08/14/2023  Pt will increase FOTO to at least 52 to demonstrate significant improvement in function at home and work related to back pain  Baseline: 44 Goal status: INITIAL  2.  Pt will decrease worst leg pain by at least 3 points on the NPRS in order to demonstrate clinically significant reduction in back pain. Baseline: 6/10 Goal status: INITIAL  3.  Pt will be able to perform sit to stand from regular heigh chair without UE assistance to demonstrate clinically significant improvement in BLE strength and decreased risk for falls. Baseline:  Goal status: INITIAL  4.  Pt will improve BERG by at least 3 points in order to demonstrate clinically significant improvement in balance.   Baseline: 12/10: 40/56 Goal status: INITIAL  5.  Pt will improve ABC by at least 13% in order to demonstrate clinically significant  improvement in balance confidence.      Baseline: 12/10: 61% Goal status: INITIAL  6. Pt will decrease TUG to below 12 seconds/decrease in order to demonstrate decreased fall risk.  Baseline: 12/10: 13.71 seconds with use of UE support to stand and cane  Goal status: INITIAL  6. Pt will increase self-selected by at least 0.13 m/s in order to demonstrate clinically significant improvement in community ambulation.   Baseline: Self-selected: 13.7 s = 0.72 m/s Goal status: INITIAL   PLAN: PT FREQUENCY: 1-2x/week  PT DURATION: 12 weeks  PLANNED INTERVENTIONS: Therapeutic exercises, Therapeutic activity, Neuromuscular re-education, Balance training, Gait training, Patient/Family education, Self Care, Joint mobilization, Joint manipulation, Vestibular training, Canalith repositioning, Orthotic/Fit training, DME instructions, Dry Needling, Electrical stimulation, Spinal manipulation, Spinal mobilization, Cryotherapy, Moist heat, Taping, Traction, Ultrasound, Ionotophoresis 4mg /ml Dexamethasone, Manual therapy, and Re-evaluation.  PLAN FOR NEXT SESSION: BLE strengthening and balance   Sharalyn Ink Sosaia Pittinger PT, DPT, GCS  Grant Henkes, PT, DPT  06/06/2023, 1:26 PM

## 2023-06-13 ENCOUNTER — Ambulatory Visit: Payer: Medicare Other

## 2023-06-21 ENCOUNTER — Ambulatory Visit: Payer: Medicare Other

## 2023-06-21 DIAGNOSIS — R262 Difficulty in walking, not elsewhere classified: Secondary | ICD-10-CM

## 2023-06-24 ENCOUNTER — Ambulatory Visit: Payer: Medicare Other

## 2023-06-24 DIAGNOSIS — M6281 Muscle weakness (generalized): Secondary | ICD-10-CM

## 2023-06-24 DIAGNOSIS — R262 Difficulty in walking, not elsewhere classified: Secondary | ICD-10-CM

## 2023-06-27 ENCOUNTER — Ambulatory Visit: Payer: Medicare Other

## 2023-06-27 DIAGNOSIS — R262 Difficulty in walking, not elsewhere classified: Secondary | ICD-10-CM

## 2023-06-27 DIAGNOSIS — M6281 Muscle weakness (generalized): Secondary | ICD-10-CM

## 2023-07-02 ENCOUNTER — Ambulatory Visit
Admission: EM | Admit: 2023-07-02 | Discharge: 2023-07-02 | Disposition: A | Discharge status: Home / Self Care | Payer: Medicare Other

## 2023-07-02 ENCOUNTER — Ambulatory Visit (INDEPENDENT_AMBULATORY_CARE_PROVIDER_SITE_OTHER): Payer: Medicare Other

## 2023-07-02 DIAGNOSIS — R051 Acute cough: Secondary | ICD-10-CM

## 2023-07-02 DIAGNOSIS — J22 Unspecified acute lower respiratory infection: Secondary | ICD-10-CM | POA: Diagnosis not present

## 2023-07-02 MED ORDER — BENZONATATE 100 MG PO CAPS: 200.0000 mg | ORAL_CAPSULE | Freq: Three times a day (TID) | ORAL | 0 refills | Status: DC

## 2023-07-02 MED ORDER — ALBUTEROL SULFATE HFA 108 (90 BASE) MCG/ACT IN AERS: 2.0000 | INHALATION_SPRAY | RESPIRATORY_TRACT | 0 refills | Status: AC | PRN

## 2023-07-02 MED ORDER — PROMETHAZINE-DM 6.25-15 MG/5ML PO SYRP: 5.0000 mL | ORAL_SOLUTION | Freq: Four times a day (QID) | ORAL | 0 refills | Status: DC | PRN

## 2023-07-02 MED ORDER — AEROCHAMBER MV MISC: 2 refills | Status: AC

## 2023-07-02 NOTE — Discharge Instructions (Addendum)
 Your chest x-ray did not show any evidence of pneumonia or fluid overload.  I do believe you have a lower respiratory tract infection causing your symptoms.  Begin taking the doxycycline  previously ordered by your primary care provider for treatment of your lower respiratory tract infection.  I will prescribe you albuterol  that you can use via inhaler with a spacer, 1 to 2 puffs every 4-6 hours, as needed for shortness of breath or wheezing.  Tessalon  Perles that she can use every 8 hours for cough during the day.  Take them with a small sip of water.  They may give you numbness to the base of your tongue, or a metallic taste in your mouth, this is normal.  I will also prescribe you some Promethazine  DM cough syrup that you can use at bedtime.  This medication will help dry up your secretions and it should help you rest.  Be mindful that it does have the ability to make you drowsy so if you have to get up in the middle of the night to use the bathroom make sure you have your faculties about you before you attempt to stand and walk.  If you develop any new or worsening symptoms either return for reevaluation, follow-up with your primary care provider, or seek care in the ER.

## 2023-07-02 NOTE — ED Triage Notes (Signed)
 Pt c/o chest congestion & cough x1 wk. Hx of lung nodules. PCP prescribed doxy & atrovent today which she hasn't p/u.

## 2023-07-02 NOTE — ED Provider Notes (Signed)
 [facility]    CSN: [id number] Arrival date & time: 07/02/23  1047      History   Chief Complaint Chief Complaint  Patient presents with   Chest Congestion   Cough    HPI Charlotte Glover is a 89 y.o. female.   HPI  89 year old female with a past medical history significant for complete heart block with a biventricular cardiac pacemaker, atrial fibrillation, CKD stage III, heart failure with preserved ejection fraction and an EF greater than 55%, ductal carcinoma in situ, hypertension, hyperlipidemia, NSTEMI, and GERD presents for evaluation of 1 week worth of cough that is productive for thick yellow sputum, shortness breath, and wheezing.  She denies any fever or URI symptoms.  She has been trying to reach her PCP and she found out just prior to arrival that he sent a prescription for doxycycline  and Atrovent  to the pharmacy.  She has not picked up these medications.  Past Medical History:  Diagnosis Date   (HFpEF) heart failure with preserved ejection fraction (HCC) 06/21/2020   a.) TTE 06/21/2020: EF >55%, RAE, G1DD   Amaurosis fugax of left eye    Arthritis    Atrial fibrillation (HCC)    a.) Dx'd in 2009; b.) CHA2DS2-VASc = 6 (age x 2, sex, CHF, HTN, prior MI); c.) s/p PVI 09/31/2013; DCCV (200 J x 1) 04/02/2012, repeat PVI with roof line, coronary sinus isolation, and CFAE ablation 01/02/2013; DCCV 01/18/2013 (200 J x 1); AV nodal ablation 05/28/2014;  d.) rate/rhythm maintained without pharmacological intervention; chronically anticoagulated using rivaroxaban    Cardiac arrest (HCC) 10/2009   a.) witnessed arrest at home; husband performed CPR; ROSC achieved in the field. Admitted for workup --> etiology of CV event undetermined; felt to be secondary to electrolyte abnormalities.   Cardiac murmur    CHB (complete heart block) (HCC)    a.) s/p PPM 05/28/2014; b.) CRT-P upgrade 07/13/2016.   Chickenpox    CKD (chronic kidney disease), stage III (HCC)     Coronary artery disease    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/13/2013   a.) grade II; ER/PR (+)   Dyspnea    Edema    GERD (gastroesophageal reflux disease)    Granulomatous disease (HCC)    a.) spleen   Hiatal hernia    HLD (hyperlipidemia)    HOH (hard of hearing)    Hypertension 01/23/2012   Jackhammer esophagus    Long term current use of antithrombotics/antiplatelets    a.) rivaroxaban    Melanoma of lower leg, left (HCC)    Meningioma, multiple (HCC) 07/14/2019   a.) MRI brain 07/14/2019: 1.2 x 1.3 x 1.3 cm midline posterior fossa meningioma inferior to the cerebellar vertex; 7 x 3 mm dural based meningioma along frontal convexity   NSTEMI (non-ST elevated myocardial infarction) (HCC) 08/2009   Presence of permanent cardiac pacemaker    Pulmonary HTN (HCC) 05/08/2016   a.) R/LHC 05/08/2016: EF >55, LVEDP 15, mean PA 38, AO sat 97, RVSP 50, CO 3 L/min   S/P ablation of atrial fibrillation    Shingles    SSS (sick sinus syndrome) (HCC)    a.) CRT-P in place   Valvular heart disease 02/08/2012   a.) cMRI 02/08/2012: EF 50-55, mild-mod AR, mod MR; b.) TEE 02/26/2012: EF >55, mild MR/AR/TR; c.) R/LHC 05/08/2016: sev MR; d.) TEE 06/14/2016: EF >55, sev MR, mod AR/TR; e.) TTE 10/05/2016: EF >55, triv PR, mod AR/MR/TR; f.) TTE 04/22/2018: EF >55, mild MR/TR/PR,  mod AR; g.) TTE 11/27/2019: EF 50, mild PR, mod AR/MR/TR; h.) TTE 06/21/2020: EF >55, triv PR, mod AR/MR/TR   Wears hearing aid in both ears     Patient Active Problem List   Diagnosis Date Noted   Bruising 05/28/2023   Right arm pain 05/28/2023   Skin tear of left upper arm without complication 05/28/2023   Total knee replacement status 12/25/2021   Prediabetes 12/14/2021   Acquired thrombophilia (HCC) 08/07/2021   Primary osteoarthritis of right knee 04/09/2021   Swelling of limb 11/01/2020   Enlarged pulmonary artery (HCC) 05/23/2020   Moderate aortic insufficiency 03/07/2020   Intracranial carotid  stenosis, right 05/29/2019   Chronic headaches 05/29/2019   Long term current use of systemic steroids 05/04/2019   Screening for osteoporosis 05/04/2019   Temporal arteritis (HCC) 04/07/2019   Meningioma (HCC) 12/29/2018   Chronic daily headache 11/20/2018   Elevated erythrocyte sedimentation rate 11/05/2018   CKD (chronic kidney disease) stage 3, GFR 30-59 ml/min (HCC) 12/27/2017   Shingles 05/06/2017   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 04/12/2017   Primary osteoarthritis of right shoulder 12/07/2016   Rotator cuff tear, right 12/07/2016   Rotator cuff tendinitis, right 12/07/2016   CHB (complete heart block) (HCC) 06/30/2016   Severe mitral regurgitation 05/29/2016   Pedal edema 04/23/2016   Lung nodule, multiple 12/28/2015   Amnesia, global, transient 04/05/2015   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 06/30/2014   Biventricular cardiac pacemaker in situ 05/18/2014   Sleep disorder 05/06/2014   Diffuse pain 04/06/2014   Fatigue 03/16/2014   Headache 03/16/2014   History of melanoma 11/24/2013   Melanoma (HCC) 11/24/2013   DCIS (ductal carcinoma in situ) 08/11/2013   History of ductal carcinoma in situ (DCIS) of breast 08/11/2013   H/O non-ST elevation myocardial infarction (NSTEMI) 01/01/2013   Hearing impairment 01/01/2013   S/P ablation of atrial fibrillation 07/11/2012   Edema 01/23/2012   Hypertension 01/23/2012   Atrial fibrillation (HCC) 11/27/2011   Coronary artery disease 11/27/2011    Past Surgical History:  Procedure Laterality Date   ANTERIOR VITRECTOMY Left 03/20/2017   Procedure: ANTERIOR VITRECTOMY;  Surgeon: Mittie Gaskin, MD;  Location: [facility];  Service: Ophthalmology;  Laterality: Left;  IVA TOPICAL LEFT   ARTERY BIOPSY Right 04/08/2019   Procedure: BIOPSY TEMPORAL ARTERY;  Surgeon: Marea Selinda RAMAN, MD;  Location: [facility];  Service: Vascular;  Laterality: Right;   ATRIAL  FIBRILLATION ABLATION N/A 03/24/2012   Procedure: ATRIAL FIBRILLATION ABLATION (PVI); Location: [facility]   ATRIAL FIBRILLATION ABLATION N/A 01/02/2013   Procedure: ATRIAL FIBRILLATION ABLATION (PVI, roof line, coronary sinus siolation, CFAE ablation); Location: [facility]   AV NODE ABLATION N/A 05/26/2014   Procedure: AV NODE ABLATION; Location: [facility]   BIV PACEMAKER INSERTION CRT-P N/A 07/13/2016   Procedure: CRT-P BiV PACEMAKER UPGRADE; Location: [facility]   BREAST BIOPSY Right 2015   + for DCIS    BREAST SURGERY     CARDIAC CATHETERIZATION Bilateral 05/08/2016   Procedure: Right/Left Heart Cath and Coronary Angiography;  Surgeon: Marsa Dooms, MD;  Location: [facility];  Service: Cardiovascular;  Laterality: Bilateral;   CARDIOVERSION N/A 04/02/2012   Procedure: CARDIOVERSION; Location: [facility]   CARDIOVERSION N/A 01/15/2013   Procedure: CARDIOVERSION; Location: [facility]   CATARACT EXTRACTION W/PHACO Left 03/20/2017   Procedure: IOL EXCHANGE;  Surgeon: Mittie Gaskin, MD;  Location: [facility];  Service: Ophthalmology;  Laterality: Left;   CHOLECYSTECTOMY     COLONOSCOPY  patient reports several   DILATION AND CURETTAGE OF UTERUS     ESOPHAGOGASTRODUODENOSCOPY     ESOPHAGOGASTRODUODENOSCOPY (EGD) WITH PROPOFOL  N/A 12/13/2020   Procedure: ESOPHAGOGASTRODUODENOSCOPY (EGD) WITH PROPOFOL ;  Surgeon: Maryruth Ole DASEN, MD;  Location: [facility];  Service: Endoscopy;  Laterality: N/A;   KNEE ARTHROPLASTY Right 12/25/2021   Procedure: COMPUTER ASSISTED TOTAL KNEE ARTHROPLASTY;  Surgeon: Mardee Lynwood SQUIBB, MD;  Location: [facility];  Service: Orthopedics;  Laterality: Right;   PACEMAKER INSERTION N/A 05/28/2014   Procedure: PACEMAKER INSERTION (dual chamber); Location: [facility]   PERIPHERAL IRIDOTOMY Left 03/20/2017   Procedure: PERIPHERAL IRIDECTOMY;  Surgeon: Mittie Gaskin, MD;  Location: [facility];  Service: Ophthalmology;  Laterality: Left;   ROTATOR CUFF REPAIR  Left    TEE WITHOUT CARDIOVERSION N/A 05/16/2016   Procedure: TRANSESOPHAGEAL ECHOCARDIOGRAM (TEE);  Surgeon: Vinie DELENA Jude, MD;  Location: [facility];  Service: Cardiovascular;  Laterality: N/A;   TONSILLECTOMY     uterine polyp      OB History   No obstetric history on file.      Home Medications    Prior to Admission medications   Medication Sig Start Date End Date Taking? Authorizing Provider  Acetaminophen  500 MG coapsule Take by mouth.   Yes [provider]  albuterol  (VENTOLIN  HFA) 108 (90 Base) MCG/ACT inhaler Inhale 2 puffs into the lungs every 4 (four) hours as needed. 07/02/23  Yes Bernardino Ditch, NP  alendronate (FOSAMAX) 70 MG tablet Take by mouth. 05/14/23 05/13/24 Yes [provider]  azelastine  (ASTELIN ) 0.1 % nasal spray Place into both nostrils as needed. Use in each nostril as directed   Yes [provider]  benzonatate  (TESSALON ) 100 MG capsule Take 2 capsules (200 mg total) by mouth every 8 (eight) hours. 07/02/23  Yes Bernardino Ditch, NP  doxycycline  (VIBRAMYCIN ) 100 MG capsule Take 1 capsule by mouth 2 (two) times daily. 07/02/23 07/09/23 Yes [provider]  esomeprazole (NEXIUM) 40 MG capsule Take by mouth.   Yes [provider]  furosemide  (LASIX ) 40 MG tablet Take 40 mg by mouth 2 (two) times daily.  03/30/16  Yes [provider]  gabapentin  (NEURONTIN ) 300 MG capsule Take 300 mg by mouth 3 (three) times daily.   Yes [provider]  ipratropium (ATROVENT ) 0.03 % nasal spray Place into the nose. 07/02/23 07/16/23 Yes [provider]  isosorbide  mononitrate (IMDUR ) 30 MG 24 hr tablet Take by mouth. 04/24/23 04/23/24 Yes [provider]  KLOR-CON  M10 10 MEQ tablet Take 2 tablets by mouth 2 (two) times daily. 01/14/23  Yes [provider]  metolazone  (ZAROXOLYN ) 5 MG tablet Take 5 mg by mouth once a week. Takes on Friday   Yes [provider]  metoprolol  succinate (TOPROL -XL) 25 MG  24 hr tablet Take 1 tablet by mouth daily. 02/13/23 02/13/24 Yes [provider]  Multiple Vitamins-Minerals (PRESERVISION AREDS 2) CAPS Take 1 capsule by mouth 2 (two) times daily.   Yes [provider]  nitroGLYCERIN  (NITROSTAT ) 0.4 MG SL tablet Place 0.4 mg under the tongue every 5 (five) minutes x 2 doses as needed for chest pain.  04/01/13  Yes [provider]  nortriptyline (PAMELOR) 10 MG capsule Take by mouth.   Yes [provider]  omeprazole  (PRILOSEC) 20 MG capsule Take 40 mg by mouth 2 (two) times daily before a meal.   Yes [provider]  Polyvinyl Alcohol -Povidone (REFRESH OP) Apply 1 drop to eye daily as needed (dry eyes).  Yes [provider]  potassium chloride  (KLOR-CON ) 10 MEQ tablet Take 10 mEq by mouth 3 (three) times daily.   Yes [provider]  predniSONE  (DELTASONE ) 10 MG tablet Take 1 tablet by mouth daily. 01/28/23  Yes [provider]  promethazine -dextromethorphan (PROMETHAZINE -DM) 6.25-15 MG/5ML syrup Take 5 mLs by mouth 4 (four) times daily as needed. 07/02/23  Yes Bernardino Ditch, NP  rivaroxaban  (XARELTO ) 20 MG TABS tablet Take 20 mg by mouth daily after breakfast.   Yes [provider]  rosuvastatin  (CRESTOR ) 10 MG tablet Take 10 mg by mouth daily.   Yes [provider]  senna (SENOKOT) 8.6 MG TABS tablet Take 1 tablet by mouth daily as needed for mild constipation.   Yes [provider]  Spacer/Aero-Holding Chambers (AEROCHAMBER MV) inhaler Use as instructed 07/02/23  Yes Bernardino Ditch, NP  spironolactone  (ALDACTONE ) 25 MG tablet Take 25 mg by mouth daily.   Yes [provider]  tiZANidine  (ZANAFLEX ) 2 MG tablet Take 2 mg by mouth 3 (three) times daily. 12/18/22 12/18/23 Yes [provider]  traMADol  (ULTRAM ) 50 MG tablet Take 1 tablet (50 mg total) by mouth every 4 (four) hours as needed for moderate pain. 12/26/21  Yes Smith, Benjamin B, PA-C  triamcinolone  ointment (KENALOG) 0.1 % Apply 1 application  topically as needed.   Yes [provider]    Family History Family History  Problem Relation Age of Onset   Breast cancer Mother    Cancer Sister    Cancer Brother    Cancer Daughter    Breast cancer Daughter    Cancer Son     Social History Social History   Tobacco Use   Smoking status: Never   Smokeless tobacco: Never  Vaping Use   Vaping status: Never Used  Substance Use Topics   Alcohol  use: Yes    Alcohol /week: 7.0 standard drinks of alcohol     Types: 7 Shots of liquor per week    Comment:  (1 scotch daily)   Drug use: No     Allergies   Amiodarone, Baclofen, Metoprolol , Pantoprazole , Silicone, and Tape   Review of Systems Review of Systems  Constitutional:  Negative for fever.  HENT:  Negative for congestion, ear pain, rhinorrhea and sore throat.   Respiratory:  Positive for cough, shortness of breath and wheezing.      Physical Exam Triage Vital Signs ED Triage Vitals [07/02/23 1132]  Encounter Vitals Group     BP      Systolic BP Percentile      Diastolic BP Percentile      Pulse      Resp 16     Temp      Temp Source Oral     SpO2      Weight      Height      Head Circumference      Peak Flow      Pain Score      Pain Loc      Pain Education      Exclude from Growth Chart    No data found.  Updated Vital Signs BP (!) 161/82 (BP Location: Left Arm)   Pulse 74   Temp 97.7 F (36.5 C) (Oral)   Resp 16   Ht 5' 9 (1.753 m)   Wt 145 lb (65.8 kg)   SpO2 98%   BMI 21.41 kg/m   Visual Acuity Right Eye Distance:   Left Eye Distance:   Bilateral  Distance:    Right Eye Near:   Left Eye Near:    Bilateral Near:     Physical Exam Vitals and nursing note reviewed.  Constitutional:      Appearance: Normal appearance. She is not ill-appearing.  HENT:     Head: Normocephalic and atraumatic.  Cardiovascular:     Rate and Rhythm: Normal rate and regular rhythm.     Pulses:  Normal pulses.     Heart sounds: Normal heart sounds. No murmur heard.    No friction rub. No gallop.  Pulmonary:     Effort: Pulmonary effort is normal.     Breath sounds: Normal breath sounds. No wheezing, rhonchi or rales.     Comments: Diffusely decreased breath sounds. Neurological:     Mental Status: She is alert.      UC Treatments / Results  Labs (all labs ordered are listed, but only abnormal results are displayed) Labs Reviewed - No data to display  EKG   Radiology DG Chest 2 View Result Date: 07/02/2023 CLINICAL DATA:  Productive cough and shortness of breath.  Wheezing. EXAM: CHEST - 2 VIEW COMPARISON:  Chest radiograph dated 02/11/2023 FINDINGS: No focal consolidation, pleural effusion, or pneumothorax. Stable mild cardiomegaly. Atherosclerotic calcification of the aorta. Left pectoral pacemaker device. No acute osseous pathology. Degenerative changes of the spine. IMPRESSION: 1. No active cardiopulmonary disease. 2. Mild cardiomegaly. Electronically Signed   By: Vanetta Chou M.D.   On: 07/02/2023 12:10    Procedures Procedures (including critical care time)  Medications Ordered in UC Medications - No data to display  Initial Impression / Assessment and Plan / UC Course  I have reviewed the triage vital signs and the nursing notes.  Pertinent labs & imaging results that were available during my care of the patient were reviewed by me and considered in my medical decision making (see chart for details).   Patient is a pleasant 89 year old female presenting for evaluation of 1 week worth of worsening lower respiratory symptoms as outlined HPI above.  She is mildly dyspneic in the exam room but is able to speak in full sentences.  She has a respiratory rate at triage of 16 with a room air oxygen saturation of 98%.  Cardiopulmonary exam reveals S1-S2 heart sounds with regular rate and rhythm.  Lung sounds are clear but decreased diffusely.  I will order a chest x-ray  to evaluate for any acute cardiopulmonary pathology.  I question the patient as to whether or not she been experiencing any chest pain or swelling in her lower extremities.  She does report some edema in both of her feet but not above baseline.  She also endorses that she had some epigastric pain last night that lasted approximately 45 minutes and resolved.  She is not having any pain at present.  She is currently on prednisone  and believes that is related to gastritis secondary to the prednisone .  Radiology impression states no active cardiopulmonary disease.  Mild cardiomegaly.  I will discharge patient home with a diagnosis of lower respiratory infection and have her start taking the medications prescribed by her primary care provider.  I will add on an albuterol  inhaler with a spacer and she can do 1 to 2 puffs every 4-6 hours as needed for any shortness of breath or wheezing.  Also Tessalon  Perles and Promethazine  DM cough syrup for cough and congestion.   Final Clinical Impressions(s) / UC Diagnoses   Final diagnoses:  Acute cough  Lower respiratory  tract infection     Discharge Instructions      Your chest x-ray did not show any evidence of pneumonia or fluid overload.  I do believe you have a lower respiratory tract infection causing your symptoms.  Begin taking the doxycycline  previously ordered by your primary care provider for treatment of your lower respiratory tract infection.  I will prescribe you albuterol  that you can use via inhaler with a spacer, 1 to 2 puffs every 4-6 hours, as needed for shortness of breath or wheezing.  Tessalon  Perles that she can use every 8 hours for cough during the day.  Take them with a small sip of water.  They may give you numbness to the base of your tongue, or a metallic taste in your mouth, this is normal.  I will also prescribe you some Promethazine  DM cough syrup that you can use at bedtime.  This medication will help dry up your  secretions and it should help you rest.  Be mindful that it does have the ability to make you drowsy so if you have to get up in the middle of the night to use the bathroom make sure you have your faculties about you before you attempt to stand and walk.  If you develop any new or worsening symptoms either return for reevaluation, follow-up with your primary care provider, or seek care in the ER.     ED Prescriptions     Medication Sig Dispense Auth. Provider   Spacer/Aero-Holding Chambers (AEROCHAMBER MV) inhaler Use as instructed 1 each Bernardino Ditch, NP   albuterol  (VENTOLIN  HFA) 108 (90 Base) MCG/ACT inhaler Inhale 2 puffs into the lungs every 4 (four) hours as needed. 18 g Bernardino Ditch, NP   benzonatate  (TESSALON ) 100 MG capsule Take 2 capsules (200 mg total) by mouth every 8 (eight) hours. 21 capsule Bernardino Ditch, NP   promethazine -dextromethorphan (PROMETHAZINE -DM) 6.25-15 MG/5ML syrup Take 5 mLs by mouth 4 (four) times daily as needed. 118 mL Bernardino Ditch, NP      PDMP not reviewed this encounter.   Bernardino Ditch, NP 07/02/23 1216

## 2023-07-03 DIAGNOSIS — R262 Difficulty in walking, not elsewhere classified: Secondary | ICD-10-CM

## 2023-07-03 DIAGNOSIS — M5459 Other low back pain: Secondary | ICD-10-CM

## 2023-07-10 ENCOUNTER — Other Ambulatory Visit: Payer: Self-pay | Admitting: Physical Medicine & Rehabilitation

## 2023-07-10 DIAGNOSIS — M546 Pain in thoracic spine: Secondary | ICD-10-CM

## 2023-07-17 ENCOUNTER — Ambulatory Visit
Admission: RE | Admit: 2023-07-17 | Discharge: 2023-07-17 | Disposition: A | Discharge status: Home / Self Care | Payer: Medicare Other | Source: Ambulatory Visit | Attending: Physical Medicine & Rehabilitation | Admitting: Physical Medicine & Rehabilitation

## 2023-07-17 DIAGNOSIS — M546 Pain in thoracic spine: Secondary | ICD-10-CM

## 2023-09-09 ENCOUNTER — Other Ambulatory Visit: Payer: Self-pay

## 2023-09-09 ENCOUNTER — Inpatient Hospital Stay
Admission: EM | Admit: 2023-09-09 | Discharge: 2023-09-11 | DRG: 378 | Disposition: A | Discharge status: Home / Self Care | Attending: Internal Medicine | Admitting: Internal Medicine

## 2023-09-09 DIAGNOSIS — K641 Second degree hemorrhoids: Secondary | ICD-10-CM | POA: Diagnosis present

## 2023-09-09 DIAGNOSIS — Z950 Presence of cardiac pacemaker: Secondary | ICD-10-CM | POA: Diagnosis present

## 2023-09-09 DIAGNOSIS — Z170 Estrogen receptor positive status [ER+]: Secondary | ICD-10-CM

## 2023-09-09 DIAGNOSIS — Z7902 Long term (current) use of antithrombotics/antiplatelets: Secondary | ICD-10-CM

## 2023-09-09 DIAGNOSIS — K529 Noninfective gastroenteritis and colitis, unspecified: Secondary | ICD-10-CM | POA: Diagnosis present

## 2023-09-09 DIAGNOSIS — Z79899 Other long term (current) drug therapy: Secondary | ICD-10-CM

## 2023-09-09 DIAGNOSIS — D649 Anemia, unspecified: Secondary | ICD-10-CM | POA: Diagnosis present

## 2023-09-09 DIAGNOSIS — K921 Melena: Secondary | ICD-10-CM | POA: Diagnosis present

## 2023-09-09 DIAGNOSIS — I130 Hypertensive heart and chronic kidney disease with heart failure and stage 1 through stage 4 chronic kidney disease, or unspecified chronic kidney disease: Secondary | ICD-10-CM | POA: Diagnosis present

## 2023-09-09 DIAGNOSIS — Z86011 Personal history of benign neoplasm of the brain: Secondary | ICD-10-CM

## 2023-09-09 DIAGNOSIS — I4891 Unspecified atrial fibrillation: Secondary | ICD-10-CM | POA: Diagnosis present

## 2023-09-09 DIAGNOSIS — I340 Nonrheumatic mitral (valve) insufficiency: Secondary | ICD-10-CM | POA: Diagnosis present

## 2023-09-09 DIAGNOSIS — K625 Hemorrhage of anus and rectum: Principal | ICD-10-CM

## 2023-09-09 DIAGNOSIS — Z7983 Long term (current) use of bisphosphonates: Secondary | ICD-10-CM

## 2023-09-09 DIAGNOSIS — I495 Sick sinus syndrome: Secondary | ICD-10-CM | POA: Diagnosis present

## 2023-09-09 DIAGNOSIS — I442 Atrioventricular block, complete: Secondary | ICD-10-CM | POA: Diagnosis present

## 2023-09-09 DIAGNOSIS — I252 Old myocardial infarction: Secondary | ICD-10-CM

## 2023-09-09 DIAGNOSIS — E785 Hyperlipidemia, unspecified: Secondary | ICD-10-CM | POA: Diagnosis present

## 2023-09-09 DIAGNOSIS — K449 Diaphragmatic hernia without obstruction or gangrene: Secondary | ICD-10-CM | POA: Diagnosis present

## 2023-09-09 DIAGNOSIS — Z9889 Other specified postprocedural states: Secondary | ICD-10-CM

## 2023-09-09 DIAGNOSIS — K635 Polyp of colon: Secondary | ICD-10-CM | POA: Diagnosis present

## 2023-09-09 DIAGNOSIS — K5731 Diverticulosis of large intestine without perforation or abscess with bleeding: Principal | ICD-10-CM | POA: Diagnosis present

## 2023-09-09 DIAGNOSIS — Z8679 Personal history of other diseases of the circulatory system: Secondary | ICD-10-CM

## 2023-09-09 DIAGNOSIS — Z7901 Long term (current) use of anticoagulants: Secondary | ICD-10-CM

## 2023-09-09 DIAGNOSIS — K219 Gastro-esophageal reflux disease without esophagitis: Secondary | ICD-10-CM | POA: Diagnosis present

## 2023-09-09 DIAGNOSIS — I5032 Chronic diastolic (congestive) heart failure: Secondary | ICD-10-CM | POA: Diagnosis present

## 2023-09-09 DIAGNOSIS — Z96651 Presence of right artificial knee joint: Secondary | ICD-10-CM | POA: Diagnosis present

## 2023-09-09 DIAGNOSIS — Z974 Presence of external hearing-aid: Secondary | ICD-10-CM

## 2023-09-09 DIAGNOSIS — Z86000 Personal history of in-situ neoplasm of breast: Secondary | ICD-10-CM | POA: Diagnosis present

## 2023-09-09 DIAGNOSIS — Z803 Family history of malignant neoplasm of breast: Secondary | ICD-10-CM

## 2023-09-09 DIAGNOSIS — Z91048 Other nonmedicinal substance allergy status: Secondary | ICD-10-CM

## 2023-09-09 DIAGNOSIS — N183 Chronic kidney disease, stage 3 unspecified: Secondary | ICD-10-CM | POA: Diagnosis present

## 2023-09-09 DIAGNOSIS — I2510 Atherosclerotic heart disease of native coronary artery without angina pectoris: Secondary | ICD-10-CM | POA: Diagnosis present

## 2023-09-09 DIAGNOSIS — I480 Paroxysmal atrial fibrillation: Secondary | ICD-10-CM | POA: Diagnosis present

## 2023-09-09 DIAGNOSIS — Z1721 Progesterone receptor positive status: Secondary | ICD-10-CM

## 2023-09-09 DIAGNOSIS — Z7952 Long term (current) use of systemic steroids: Secondary | ICD-10-CM

## 2023-09-09 DIAGNOSIS — I081 Rheumatic disorders of both mitral and tricuspid valves: Secondary | ICD-10-CM | POA: Diagnosis present

## 2023-09-09 DIAGNOSIS — Z853 Personal history of malignant neoplasm of breast: Secondary | ICD-10-CM

## 2023-09-09 DIAGNOSIS — M316 Other giant cell arteritis: Secondary | ICD-10-CM | POA: Diagnosis present

## 2023-09-09 DIAGNOSIS — I10 Essential (primary) hypertension: Secondary | ICD-10-CM | POA: Diagnosis present

## 2023-09-09 DIAGNOSIS — Z85820 Personal history of malignant melanoma of skin: Secondary | ICD-10-CM

## 2023-09-09 DIAGNOSIS — Z888 Allergy status to other drugs, medicaments and biological substances status: Secondary | ICD-10-CM

## 2023-09-09 LAB — COMPREHENSIVE METABOLIC PANEL
ALT: 23 U/L (ref 0–44)
AST: 29 U/L (ref 15–41)
Albumin: 3.8 g/dL (ref 3.5–5.0)
Alkaline Phosphatase: 32 U/L — ABNORMAL LOW (ref 38–126)
Anion gap: 12 (ref 5–15)
BUN: 40 mg/dL — ABNORMAL HIGH (ref 8–23)
CO2: 30 mmol/L (ref 22–32)
Calcium: 9.5 mg/dL (ref 8.9–10.3)
Chloride: 91 mmol/L — ABNORMAL LOW (ref 98–111)
Creatinine, Ser: 1.53 mg/dL — ABNORMAL HIGH (ref 0.44–1.00)
GFR, Estimated: 32 mL/min — ABNORMAL LOW (ref >60)
Glucose, Bld: 135 mg/dL — ABNORMAL HIGH (ref 70–99)
Potassium: 4.3 mmol/L (ref 3.5–5.1)
Sodium: 133 mmol/L — ABNORMAL LOW (ref 135–145)
Total Bilirubin: 1.2 mg/dL (ref 0.0–1.2)
Total Protein: 5.9 g/dL — ABNORMAL LOW (ref 6.5–8.1)

## 2023-09-09 LAB — CBC WITH DIFFERENTIAL/PLATELET
Abs Immature Granulocytes: 0.11 10*3/uL — ABNORMAL HIGH (ref 0.00–0.07)
Basophils Absolute: 0 10*3/uL (ref 0.0–0.1)
Basophils Relative: 0 %
Eosinophils Absolute: 0 10*3/uL (ref 0.0–0.5)
Eosinophils Relative: 0 %
HCT: 37.8 % (ref 36.0–46.0)
Hemoglobin: 12.5 g/dL (ref 12.0–15.0)
Immature Granulocytes: 1 %
Lymphocytes Relative: 8 %
Lymphs Abs: 0.7 10*3/uL (ref 0.7–4.0)
MCH: 31.4 pg (ref 26.0–34.0)
MCHC: 33.1 g/dL (ref 30.0–36.0)
MCV: 95 fL (ref 80.0–100.0)
Monocytes Absolute: 0.6 10*3/uL (ref 0.1–1.0)
Monocytes Relative: 6 %
Neutro Abs: 7.4 10*3/uL (ref 1.7–7.7)
Neutrophils Relative %: 85 %
Platelets: 241 10*3/uL (ref 150–400)
RBC: 3.98 MIL/uL (ref 3.87–5.11)
RDW: 14.1 % (ref 11.5–15.5)
WBC: 8.8 10*3/uL (ref 4.0–10.5)
nRBC: 0 % (ref 0.0–0.2)

## 2023-09-09 LAB — APTT: aPTT: 31 s (ref 24–36)

## 2023-09-09 LAB — TYPE AND SCREEN
ABO/RH(D): A POS
Antibody Screen: NEGATIVE

## 2023-09-09 LAB — PROTIME-INR
INR: 1.7 — ABNORMAL HIGH (ref 0.8–1.2)
Prothrombin Time: 20.3 s — ABNORMAL HIGH (ref 11.4–15.2)

## 2023-09-09 MED ORDER — SODIUM CHLORIDE 0.9 % IV BOLUS
500.0000 mL | Freq: Once | INTRAVENOUS | Status: AC
  Administered 2023-09-10: 500 mL via INTRAVENOUS

## 2023-09-09 NOTE — ED Notes (Signed)
 Pt sitting in lobby in w/c with no distress noted; son reports pt is c/o CP; pt taken to triage for vs and EKG

## 2023-09-09 NOTE — ED Notes (Signed)
 Pt refused EKG, stated she isnt having chest pains anymore, and does not want one any longer

## 2023-09-09 NOTE — ED Triage Notes (Signed)
 Pt to ED for rectal bleeding started at 1100 today. +diarrhea. Reports at least 5 episodes today. NAD noted. +blood thinners.

## 2023-09-09 NOTE — ED Provider Notes (Signed)
 [facility] Provider Note    Event Date/Time   First MD Initiated Contact with Patient 09/09/23 2337     (approximate)   History   Rectal Bleeding   HPI {Remember to add pertinent medical, surgical, social, and/or OB history to HPI:1} Charlotte Glover is a 89 y.o. female  ***       Past Medical History   Past Medical History:  Diagnosis Date  . (HFpEF) heart failure with preserved ejection fraction (HCC) 06/21/2020   a.) TTE 06/21/2020: EF >55%, RAE, G1DD  . Amaurosis fugax of left eye   . Arthritis   . Atrial fibrillation (HCC)    a.) Dx'd in 2009; b.) CHA2DS2-VASc = 6 (age x 2, sex, CHF, HTN, prior MI); c.) s/p PVI 09/31/2013; DCCV (200 J x 1) 04/02/2012, repeat PVI with roof line, coronary sinus isolation, and CFAE ablation 01/02/2013; DCCV 01/18/2013 (200 J x 1); AV nodal ablation 05/28/2014;  d.) rate/rhythm maintained without pharmacological intervention; chronically anticoagulated using rivaroxaban  . Cardiac arrest (HCC) 10/2009   a.) witnessed arrest at home; husband performed CPR; ROSC achieved in the field. Admitted for workup --> etiology of CV event undetermined; felt to be secondary to "electrolyte abnormalities".  . Cardiac murmur   . CHB (complete heart block) (HCC)    a.) s/p PPM 05/28/2014; b.) CRT-P upgrade 07/13/2016.  Marland Kitchen Chickenpox   . CKD (chronic kidney disease), stage III (HCC)   . Coronary artery disease   . 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/13/2013   a.) grade II; ER/PR (+)  . Dyspnea   . Edema   . GERD (gastroesophageal reflux disease)   . Granulomatous disease (HCC)    a.) spleen  . Hiatal hernia   . HLD (hyperlipidemia)   . HOH (hard of hearing)   . Hypertension 01/23/2012  . 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 Long term current use of antithrombotics/antiplatelets    a.) rivaroxaban  . Melanoma of lower leg, left (HCC)   . Meningioma, multiple (HCC) 07/14/2019   a.) MRI brain 07/14/2019: 1.2 x 1.3  x 1.3 cm midline posterior fossa meningioma inferior to the cerebellar vertex; 7 x 3 mm dural based meningioma along frontal convexity  . NSTEMI (non-ST elevated myocardial infarction) (HCC) 08/2009  . Presence of permanent cardiac pacemaker   . Pulmonary HTN (HCC) 05/08/2016   a.) R/LHC 05/08/2016: EF >55, LVEDP 15, mean PA 38, AO sat 97, RVSP 50, CO 3 L/min  . S/P ablation of atrial fibrillation   . Shingles   . SSS (sick sinus syndrome) (HCC)    a.) CRT-P in place  . Valvular heart disease 02/08/2012   a.) cMRI 02/08/2012: EF 50-55, mild-mod AR, mod MR; b.) TEE 02/26/2012: EF >55, mild MR/AR/TR; c.) R/LHC 05/08/2016: sev MR; d.) TEE 06/14/2016: EF >55, sev MR, mod AR/TR; e.) TTE 10/05/2016: EF >55, triv PR, mod AR/MR/TR; f.) TTE 04/22/2018: EF >55, mild MR/TR/PR, mod AR; g.) TTE 11/27/2019: EF 50, mild PR, mod AR/MR/TR; h.) TTE 06/21/2020: EF >55, triv PR, mod AR/MR/TR  . Wears hearing aid in both ears      Active Problem List   Patient Active Problem List   Diagnosis Date Noted  . Bruising 05/28/2023  . Right arm pain 05/28/2023  . Skin tear of left upper arm without complication 05/28/2023  . Total knee replacement status 12/25/2021  . Prediabetes 12/14/2021  . Acquired thrombophilia (HCC) 08/07/2021  . Primary osteoarthritis of right knee 04/09/2021  .  Swelling of limb 11/01/2020  . Enlarged pulmonary artery (HCC) 05/23/2020  . Moderate aortic insufficiency 03/07/2020  . Intracranial carotid stenosis, right 05/29/2019  . Chronic headaches 05/29/2019  . Long term current use of systemic steroids 05/04/2019  . Screening for osteoporosis 05/04/2019  . Temporal arteritis (HCC) 04/07/2019  . Meningioma (HCC) 12/29/2018  . Chronic daily headache 11/20/2018  . Elevated erythrocyte sedimentation rate 11/05/2018  . CKD (chronic kidney disease) stage 3, GFR 30-59 ml/min (HCC) 12/27/2017  . Shingles 05/06/2017  . 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  Primary osteoarthritis of right shoulder 12/07/2016  . Rotator cuff tear, right 12/07/2016  . Rotator cuff tendinitis, right 12/07/2016  . CHB (complete heart block) (HCC) 06/30/2016  . Severe mitral regurgitation 05/29/2016  . Pedal edema 04/23/2016  . Lung nodule, multiple 12/28/2015  . Amnesia, global, transient 04/05/2015  . 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 Biventricular cardiac pacemaker in situ 05/18/2014  . Sleep disorder 05/06/2014  . Diffuse pain 04/06/2014  . Fatigue 03/16/2014  . Headache 03/16/2014  . History of melanoma 11/24/2013  . Melanoma (HCC) 11/24/2013  . DCIS (ductal carcinoma in situ) 08/11/2013  . History of ductal carcinoma in situ (DCIS) of breast 08/11/2013  . H/O non-ST elevation myocardial infarction (NSTEMI) 01/01/2013  . Hearing impairment 01/01/2013  . S/P ablation of atrial fibrillation 07/11/2012  . Edema 01/23/2012  . Hypertension 01/23/2012  . Atrial fibrillation (HCC) 11/27/2011  . Coronary artery disease 11/27/2011     Past Surgical History   Past Surgical History:  Procedure Laterality Date  . ANTERIOR VITRECTOMY Left 03/20/2017   Procedure: ANTERIOR VITRECTOMY;  Surgeon: Lockie Mola, MD;  Location[facility];  Service: Ophthalmology;  Laterality: Left;  IVA TOPICAL LEFT  . ARTERY BIOPSY Right 04/08/2019   Procedure: BIOPSY TEMPORAL ARTERY;  Surgeon: Annice Needy, MD;  Location: [facility];  Service: Vascular;  Laterality: Right;  . ATRIAL FIBRILLATION ABLATION N/A 03/24/2012   Procedure: ATRIAL FIBRILLATION ABLATION (PVI); Location: [facility]  . ATRIAL FIBRILLATION ABLATION N/A 01/02/2013   Procedure: ATRIAL FIBRILLATION ABLATION (PVI, roof line, coronary sinus siolation, CFAE ablation); Location: [facility]  . AV NODE ABLATION N/A 05/26/2014   Procedure: AV NODE ABLATION; Location: [facility]  . BIV PACEMAKER INSERTION CRT-P N/A 07/13/2016   Procedure: CRT-P BiV PACEMAKER  UPGRADE; Location: [facility]  . BREAST BIOPSY Right 2015   + for DCIS   . BREAST SURGERY    . CARDIAC CATHETERIZATION Bilateral 05/08/2016   Procedure: Right/Left Heart Cath and Coronary Angiography;  Surgeon: Marcina Millard, MD;  Location: [facility];  Service: Cardiovascular;  Laterality: Bilateral;  . CARDIOVERSION N/A 04/02/2012   Procedure: CARDIOVERSION; Location: [facility]  . CARDIOVERSION N/A 01/15/2013   Procedure: CARDIOVERSION; Location: [facility]  . CATARACT EXTRACTION W/PHACO Left 03/20/2017   Procedure: IOL EXCHANGE;  Surgeon: Lockie Mola, MD;  Location: [facility];  Service: Ophthalmology;  Laterality: Left;  . CHOLECYSTECTOMY    . COLONOSCOPY     patient reports several  . DILATION AND CURETTAGE OF UTERUS    . ESOPHAGOGASTRODUODENOSCOPY    . ESOPHAGOGASTRODUODENOSCOPY (EGD) WITH PROPOFOL N/A 12/13/2020   Procedure: ESOPHAGOGASTRODUODENOSCOPY (EGD) WITH PROPOFOL;  Surgeon: Regis Bill, MD;  Location: [facility];  Service: Endoscopy;  Laterality: N/A;  . KNEE ARTHROPLASTY Right 12/25/2021   Procedure: COMPUTER ASSISTED TOTAL KNEE ARTHROPLASTY;  Surgeon: Donato Heinz, MD;  Location: [facility];  Service: Orthopedics;  Laterality: Right;  .  PACEMAKER INSERTION N/A 05/28/2014   Procedure: PACEMAKER INSERTION (dual chamber); Location: [facility]  . PERIPHERAL IRIDOTOMY Left 03/20/2017   Procedure: PERIPHERAL IRIDECTOMY;  Surgeon: Lockie Mola, MD;  Location: [facility];  Service: Ophthalmology;  Laterality: Left;  . ROTATOR CUFF REPAIR Left   . TEE WITHOUT CARDIOVERSION N/A 05/16/2016   Procedure: TRANSESOPHAGEAL ECHOCARDIOGRAM (TEE);  Surgeon: Dalia Heading, MD;  Location: [facility];  Service: Cardiovascular;  Laterality: N/A;  . TONSILLECTOMY    . uterine polyp       Home Medications   Prior to Admission medications   Medication Sig Start Date End Date Taking? Authorizing Provider  Acetaminophen 500 MG coapsule Take by mouth.     [provider]  albuterol (VENTOLIN HFA) 108 (90 Base) MCG/ACT inhaler Inhale 2 puffs into the lungs every 4 (four) hours as needed. 07/02/23   Becky Augusta, NP  alendronate (FOSAMAX) 70 MG tablet Take by mouth. 05/14/23 05/13/24  [provider]  azelastine (ASTELIN) 0.1 % nasal spray Place into both nostrils as needed. Use in each nostril as directed    [provider]  benzonatate (TESSALON) 100 MG capsule Take 2 capsules (200 mg total) by mouth every 8 (eight) hours. 07/02/23   Becky Augusta, NP  esomeprazole (NEXIUM) 40 MG capsule Take by mouth.    [provider]  furosemide (LASIX) 40 MG tablet Take 40 mg by mouth 2 (two) times daily.  03/30/16   [provider]  gabapentin (NEURONTIN) 300 MG capsule Take 300 mg by mouth 3 (three) times daily.    [provider]  ipratropium (ATROVENT) 0.03 % nasal spray Place into the nose. 07/02/23 07/16/23  [provider]  isosorbide mononitrate (IMDUR) 30 MG 24 hr tablet Take by mouth. 04/24/23 04/23/24  [provider]  KLOR-CON M10 10 MEQ tablet Take 2 tablets by mouth 2 (two) times daily. 01/14/23   [provider]  metolazone (ZAROXOLYN) 5 MG tablet Take 5 mg by mouth once a week. Takes on Friday    [provider]  metoprolol succinate (TOPROL-XL) 25 MG 24 hr tablet Take 1 tablet by mouth daily. 02/13/23 02/13/24  [provider]  Multiple Vitamins-Minerals (PRESERVISION AREDS 2) CAPS Take 1 capsule by mouth 2 (two) times daily.    [provider]  nitroGLYCERIN (NITROSTAT) 0.4 MG SL tablet Place 0.4 mg under the tongue every 5 (five) minutes x 2 doses as needed for chest pain.  04/01/13   [provider]  nortriptyline (PAMELOR) 10 MG capsule Take by mouth.    [provider]  omeprazole (PRILOSEC) 20 MG capsule Take 40 mg by mouth 2 (two) times daily before a meal.    [provider]  Polyvinyl Alcohol-Povidone (REFRESH OP)  Apply 1 drop to eye daily as needed (dry eyes).    [provider]  potassium chloride (KLOR-CON) 10 MEQ tablet Take 10 mEq by mouth 3 (three) times daily.    [provider]  predniSONE (DELTASONE) 10 MG tablet Take 1 tablet by mouth daily. 01/28/23   [provider]  promethazine-dextromethorphan (PROMETHAZINE-DM) 6.25-15 MG/5ML syrup Take 5 mLs by mouth 4 (four) times daily as needed. 07/02/23   Becky Augusta, NP  rivaroxaban (XARELTO) 20 MG TABS tablet Take 20 mg by mouth daily after breakfast.    [provider]  rosuvastatin (CRESTOR) 10 MG tablet Take 10 mg by mouth daily.    [provider]  senna (SENOKOT) 8.6 MG TABS tablet Take 1  tablet by mouth daily as needed for mild constipation.    [provider]  Spacer/Aero-Holding Chambers (AEROCHAMBER MV) inhaler Use as instructed 07/02/23   Becky Augusta, NP  spironolactone (ALDACTONE) 25 MG tablet Take 25 mg by mouth daily.    [provider]  tiZANidine (ZANAFLEX) 2 MG tablet Take 2 mg by mouth 3 (three) times daily. 12/18/22 12/18/23  [provider]  traMADol (ULTRAM) 50 MG tablet Take 1 tablet (50 mg total) by mouth every 4 (four) hours as needed for moderate pain. 12/26/21   Lasandra Beech B, PA-C  triamcinolone ointment (KENALOG) 0.1 % Apply 1 application  topically as needed.    [provider]     Allergies  Amiodarone, Baclofen, Metoprolol, Pantoprazole, Silicone, and Tape   Family History   Family History  Problem Relation Age of Onset  . Breast cancer Mother   . Cancer Sister   . Cancer Brother   . Cancer Daughter   . Breast cancer Daughter   . Cancer Son      Physical Exam  Triage Vital Signs: ED Triage Vitals  Encounter Vitals Group     BP 09/09/23 1608 (!) 142/58     Systolic BP Percentile --      Diastolic BP Percentile --      Pulse Rate 09/09/23 1608 73     Resp 09/09/23 1608 18     Temp 09/09/23 1608 97.8 F (36.6 C)     Temp  Source 09/09/23 2016 Oral     SpO2 09/09/23 1608 100 %     Weight 09/09/23 1609 145 lb (65.8 kg)     Height 09/09/23 1609 5\' 9"  (1.753 m)     Head Circumference --      Peak Flow --      Pain Score 09/09/23 1609 0     Pain Loc --      Pain Education --      Exclude from Growth Chart --     Updated Vital Signs: BP (!) 149/53   Pulse 70   Temp 98.2 F (36.8 C) (Oral)   Resp 19   Ht 5\' 9"  (1.753 m)   Wt 65.8 kg   SpO2 99%   BMI 21.41 kg/m   {Only need to document appropriate and relevant physical exam:1} General: Awake, no distress. *** CV:  Good peripheral perfusion. *** Resp:  Normal effort. *** Abd:  No distention. *** Other:  ***   ED Results / Procedures / Treatments  Labs (all labs ordered are listed, but only abnormal results are displayed) Labs Reviewed  COMPREHENSIVE METABOLIC PANEL - Abnormal; Notable for the following components:      Result Value   Sodium 133 (*)    Chloride 91 (*)    Glucose, Bld 135 (*)    BUN 40 (*)    Creatinine, Ser 1.53 (*)    Total Protein 5.9 (*)    Alkaline Phosphatase 32 (*)    GFR, Estimated 32 (*)    All other components within normal limits  CBC WITH DIFFERENTIAL/PLATELET - Abnormal; Notable for the following components:   Abs Immature Granulocytes 0.11 (*)    All other components within normal limits  PROTIME-INR - Abnormal; Notable for the following components:   Prothrombin Time 20.3 (*)    INR 1.7 (*)    All other components within normal limits  APTT  CBC  TYPE AND SCREEN     EKG  ***   RADIOLOGY *** {  You MUST document your own interpretation of imaging, as well as the fact that you reviewed the radiologist's report!:1}  Official radiology report(s): No results found.   PROCEDURES:  Critical Care performed: {CriticalCareYesNo:19197::"Yes, see critical care procedure note(s)","No"}  Procedures   MEDICATIONS ORDERED IN ED: Medications - No data to display   IMPRESSION / MDM / ASSESSMENT AND  PLAN / ED COURSE  I reviewed the triage vital signs and the nursing notes.                              Differential diagnosis includes, but is not limited to, ***  Patient's presentation is most consistent with {EM COPA:27473}  {If the patient is on the monitor, remove the brackets and asterisks on the sentence below and remember to document it as a Procedure as well. Otherwise delete the sentence below:1} {**The patient is on the cardiac monitor to evaluate for evidence of arrhythmia and/or significant heart rate changes.**}  {Remember to include, when applicable, any/all of the following data: independent review of imaging independent review of labs (comment specifically on pertinent positives and negatives) review of specific prior hospitalizations, PCP/specialist notes, etc. discuss meds given and prescribed document any discussion with consultants (including hospitalists) any clinical decision tools you used and why (PECARN, NEXUS, etc.) did you consider admitting the patient? document social determinants of health affecting patient's care (homelessness, inability to follow up in a timely fashion, etc) document any pre-existing conditions increasing risk on current visit (e.g. diabetes and HTN increasing danger of high-risk chest pain/ACS) describes what meds you gave (especially parenteral) and why any other interventions?:1}      FINAL CLINICAL IMPRESSION(S) / ED DIAGNOSES   Final diagnoses:  None     Rx / DC Orders   ED Discharge Orders     None        Note:  This document was prepared using Dragon voice recognition software and may include unintentional dictation errors.

## 2023-09-09 NOTE — ED Provider Triage Note (Signed)
 Emergency Medicine Provider Triage Evaluation Note  Charlotte Glover , a 89 y.o. female  was evaluated in triage.  Pt complains of rectal bleeding. She has had small amounts of diarrhea. She states she has had bloody bowel movements 5 times today. She describes them as blood explosions. There is blood in the toilet bowl not just when she wipes.   Review of Systems  Positive: Rectal bleeding, light headed Negative: Abdominal pain  Physical Exam  There were no vitals taken for this visit. Gen:   Awake, no distress   Resp:  Normal effort  MSK:   Moves extremities without difficulty  Other:    Medical Decision Making  Medically screening exam initiated at 4:06 PM.  Appropriate orders placed.  Charlotte Glover was informed that the remainder of the evaluation will be completed by another provider, this initial triage assessment does not replace that evaluation, and the importance of remaining in the ED until their evaluation is complete.     Cameron Ali, PA-C 09/09/23 1610

## 2023-09-10 DIAGNOSIS — K921 Melena: Secondary | ICD-10-CM | POA: Diagnosis not present

## 2023-09-10 DIAGNOSIS — R710 Precipitous drop in hematocrit: Secondary | ICD-10-CM | POA: Diagnosis not present

## 2023-09-10 DIAGNOSIS — K625 Hemorrhage of anus and rectum: Secondary | ICD-10-CM

## 2023-09-10 LAB — HEMOGLOBIN AND HEMATOCRIT, BLOOD
HCT: 34.4 % — ABNORMAL LOW (ref 36.0–46.0)
Hemoglobin: 11.4 g/dL — ABNORMAL LOW (ref 12.0–15.0)

## 2023-09-10 LAB — HEMOGLOBIN
Hemoglobin: 9.9 g/dL — ABNORMAL LOW (ref 12.0–15.0)
Hemoglobin: 9.1 g/dL — ABNORMAL LOW (ref 12.0–15.0)

## 2023-09-10 LAB — TROPONIN I (HIGH SENSITIVITY): Troponin I (High Sensitivity): 42 ng/L — ABNORMAL HIGH (ref <18)

## 2023-09-10 MED ORDER — ROSUVASTATIN CALCIUM 10 MG PO TABS
10.0000 mg | ORAL_TABLET | Freq: Every day | ORAL | Status: DC
  Administered 2023-09-10: 10 mg via ORAL
  Filled 2023-09-10 (×2): qty 1

## 2023-09-10 MED ORDER — METOPROLOL SUCCINATE ER 25 MG PO TB24: 25.0000 mg | ORAL_TABLET | Freq: Every day | ORAL | Status: DC

## 2023-09-10 MED ORDER — NA SULFATE-K SULFATE-MG SULF 17.5-3.13-1.6 GM/177ML PO SOLN
0.5000 | Freq: Once | ORAL | Status: AC
  Administered 2023-09-10: 177 mL via ORAL
  Filled 2023-09-10: qty 1

## 2023-09-10 MED ORDER — ONDANSETRON HCL 4 MG/2ML IJ SOLN
4.0000 mg | Freq: Four times a day (QID) | INTRAMUSCULAR | Status: DC | PRN
  Administered 2023-09-11 (×2): 4 mg via INTRAVENOUS
  Filled 2023-09-10 (×2): qty 2

## 2023-09-10 MED ORDER — ALBUTEROL SULFATE (2.5 MG/3ML) 0.083% IN NEBU: 2.5000 mg | INHALATION_SOLUTION | RESPIRATORY_TRACT | Status: DC | PRN

## 2023-09-10 MED ORDER — NA SULFATE-K SULFATE-MG SULF 17.5-3.13-1.6 GM/177ML PO SOLN
0.5000 | Freq: Once | ORAL | Status: AC
  Administered 2023-09-11: 177 mL via ORAL
  Filled 2023-09-10: qty 1

## 2023-09-10 MED ORDER — NITROGLYCERIN 0.4 MG SL SUBL: 0.4000 mg | SUBLINGUAL_TABLET | SUBLINGUAL | Status: DC | PRN

## 2023-09-10 MED ORDER — PREDNISONE 10 MG PO TABS
10.0000 mg | ORAL_TABLET | Freq: Every day | ORAL | Status: DC
  Administered 2023-09-10 – 2023-09-11 (×2): 10 mg via ORAL
  Filled 2023-09-10 (×2): qty 1

## 2023-09-10 MED ORDER — TIZANIDINE HCL 2 MG PO TABS: 2.0000 mg | ORAL_TABLET | Freq: Three times a day (TID) | ORAL | Status: DC | PRN

## 2023-09-10 MED ORDER — PANTOPRAZOLE SODIUM 40 MG PO TBEC
40.0000 mg | DELAYED_RELEASE_TABLET | Freq: Every day | ORAL | Status: DC
  Administered 2023-09-10 – 2023-09-11 (×2): 40 mg via ORAL
  Filled 2023-09-10 (×2): qty 1

## 2023-09-10 MED ORDER — ALBUTEROL SULFATE HFA 108 (90 BASE) MCG/ACT IN AERS: 2.0000 | INHALATION_SPRAY | RESPIRATORY_TRACT | Status: DC | PRN

## 2023-09-10 MED ORDER — ACETAMINOPHEN 325 MG PO TABS: 650.0000 mg | ORAL_TABLET | Freq: Four times a day (QID) | ORAL | Status: DC | PRN

## 2023-09-10 MED ORDER — HYDROCODONE-ACETAMINOPHEN 5-325 MG PO TABS
1.0000 | ORAL_TABLET | ORAL | Status: DC | PRN
Start: 2023-09-10 — End: 2023-09-11

## 2023-09-10 MED ORDER — ONDANSETRON HCL 4 MG PO TABS: 4.0000 mg | ORAL_TABLET | Freq: Four times a day (QID) | ORAL | Status: DC | PRN

## 2023-09-10 MED ORDER — ACETAMINOPHEN 325 MG RE SUPP: 650.0000 mg | Freq: Four times a day (QID) | RECTAL | Status: DC | PRN

## 2023-09-10 NOTE — ED Notes (Signed)
 This helped pt get off of bedside commode in room, pt had a small goofball size clot in container, this tech made RN aware.

## 2023-09-10 NOTE — Assessment & Plan Note (Signed)
 Patient with at least 5 episodes of rectal bleeding.  Briefly had abdominal pain at onset now resolved No recent colonoscopy.  Last EGD 2022 for dysphagia notable for tortuous esophagus Serial H&H and transfuse if needed.  Discussed with patient and agreeable if needed Will keep n.p.o. GI consult

## 2023-09-10 NOTE — Assessment & Plan Note (Addendum)
 Elevated troponin and history of NSTEMI Had brief chest pain prior to arrival now resolved.  Troponin 42 suspect demand.  No EKG as patient declined, will try to encourage to get EKG Continue GDMT with Imdur,  rosuvastatin.  Holding Xarelto

## 2023-09-10 NOTE — ED Provider Notes (Incomplete)
 [facility] Provider Note    Event Date/Time   First MD Initiated Contact with Patient 09/09/23 2337     (approximate)   History   Rectal Bleeding   HPI {Remember to add pertinent medical, surgical, social, and/or OB history to HPI:1} Charlotte Glover is a 89 y.o. female  ***       Past Medical History   Past Medical History:  Diagnosis Date  . (HFpEF) heart failure with preserved ejection fraction (HCC) 06/21/2020   a.) TTE 06/21/2020: EF >55%, RAE, G1DD  . Amaurosis fugax of left eye   . Arthritis   . Atrial fibrillation (HCC)    a.) Dx'd in 2009; b.) CHA2DS2-VASc = 6 (age x 2, sex, CHF, HTN, prior MI); c.) s/p PVI 09/31/2013; DCCV (200 J x 1) 04/02/2012, repeat PVI with roof line, coronary sinus isolation, and CFAE ablation 01/02/2013; DCCV 01/18/2013 (200 J x 1); AV nodal ablation 05/28/2014;  d.) rate/rhythm maintained without pharmacological intervention; chronically anticoagulated using rivaroxaban  . Cardiac arrest (HCC) 10/2009   a.) witnessed arrest at home; husband performed CPR; ROSC achieved in the field. Admitted for workup --> etiology of CV event undetermined; felt to be secondary to "electrolyte abnormalities".  . Cardiac murmur   . CHB (complete heart block) (HCC)    a.) s/p PPM 05/28/2014; b.) CRT-P upgrade 07/13/2016.  Marland Kitchen Chickenpox   . CKD (chronic kidney disease), stage III (HCC)   . Coronary artery disease   . 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/13/2013   a.) grade II; ER/PR (+)  . Dyspnea   . Edema   . GERD (gastroesophageal reflux disease)   . Granulomatous disease (HCC)    a.) spleen  . Hiatal hernia   . HLD (hyperlipidemia)   . HOH (hard of hearing)   . Hypertension 01/23/2012  . 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 Long term current use of antithrombotics/antiplatelets    a.) rivaroxaban  . Melanoma of lower leg, left (HCC)   . Meningioma, multiple (HCC) 07/14/2019   a.) MRI brain 07/14/2019: 1.2 x 1.3  x 1.3 cm midline posterior fossa meningioma inferior to the cerebellar vertex; 7 x 3 mm dural based meningioma along frontal convexity  . NSTEMI (non-ST elevated myocardial infarction) (HCC) 08/2009  . Presence of permanent cardiac pacemaker   . Pulmonary HTN (HCC) 05/08/2016   a.) R/LHC 05/08/2016: EF >55, LVEDP 15, mean PA 38, AO sat 97, RVSP 50, CO 3 L/min  . S/P ablation of atrial fibrillation   . Shingles   . SSS (sick sinus syndrome) (HCC)    a.) CRT-P in place  . Valvular heart disease 02/08/2012   a.) cMRI 02/08/2012: EF 50-55, mild-mod AR, mod MR; b.) TEE 02/26/2012: EF >55, mild MR/AR/TR; c.) R/LHC 05/08/2016: sev MR; d.) TEE 06/14/2016: EF >55, sev MR, mod AR/TR; e.) TTE 10/05/2016: EF >55, triv PR, mod AR/MR/TR; f.) TTE 04/22/2018: EF >55, mild MR/TR/PR, mod AR; g.) TTE 11/27/2019: EF 50, mild PR, mod AR/MR/TR; h.) TTE 06/21/2020: EF >55, triv PR, mod AR/MR/TR  . Wears hearing aid in both ears      Active Problem List   Patient Active Problem List   Diagnosis Date Noted  . Bruising 05/28/2023  . Right arm pain 05/28/2023  . Skin tear of left upper arm without complication 05/28/2023  . Total knee replacement status 12/25/2021  . Prediabetes 12/14/2021  . Acquired thrombophilia (HCC) 08/07/2021  . Primary osteoarthritis of right knee 04/09/2021  .  Swelling of limb 11/01/2020  . Enlarged pulmonary artery (HCC) 05/23/2020  . Moderate aortic insufficiency 03/07/2020  . Intracranial carotid stenosis, right 05/29/2019  . Chronic headaches 05/29/2019  . Long term current use of systemic steroids 05/04/2019  . Screening for osteoporosis 05/04/2019  . Temporal arteritis (HCC) 04/07/2019  . Meningioma (HCC) 12/29/2018  . Chronic daily headache 11/20/2018  . Elevated erythrocyte sedimentation rate 11/05/2018  . CKD (chronic kidney disease) stage 3, GFR 30-59 ml/min (HCC) 12/27/2017  . Shingles 05/06/2017  . 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  Primary osteoarthritis of right shoulder 12/07/2016  . Rotator cuff tear, right 12/07/2016  . Rotator cuff tendinitis, right 12/07/2016  . CHB (complete heart block) (HCC) 06/30/2016  . Severe mitral regurgitation 05/29/2016  . Pedal edema 04/23/2016  . Lung nodule, multiple 12/28/2015  . Amnesia, global, transient 04/05/2015  . 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 Biventricular cardiac pacemaker in situ 05/18/2014  . Sleep disorder 05/06/2014  . Diffuse pain 04/06/2014  . Fatigue 03/16/2014  . Headache 03/16/2014  . History of melanoma 11/24/2013  . Melanoma (HCC) 11/24/2013  . DCIS (ductal carcinoma in situ) 08/11/2013  . History of ductal carcinoma in situ (DCIS) of breast 08/11/2013  . H/O non-ST elevation myocardial infarction (NSTEMI) 01/01/2013  . Hearing impairment 01/01/2013  . S/P ablation of atrial fibrillation 07/11/2012  . Edema 01/23/2012  . Hypertension 01/23/2012  . Atrial fibrillation (HCC) 11/27/2011  . Coronary artery disease 11/27/2011     Past Surgical History   Past Surgical History:  Procedure Laterality Date  . ANTERIOR VITRECTOMY Left 03/20/2017   Procedure: ANTERIOR VITRECTOMY;  Surgeon: Lockie Mola, MD;  Location: [facility];  Service: Ophthalmology;  Laterality: Left;  IVA TOPICAL LEFT  . ARTERY BIOPSY Right 04/08/2019   Procedure: BIOPSY TEMPORAL ARTERY;  Surgeon: Annice Needy, MD;  Location: [facility];  Service: Vascular;  Laterality: Right;  . ATRIAL FIBRILLATION ABLATION N/A 03/24/2012   Procedure: ATRIAL FIBRILLATION ABLATION (PVI); Location: [facility]  . ATRIAL FIBRILLATION ABLATION N/A 01/02/2013   Procedure: ATRIAL FIBRILLATION ABLATION (PVI, roof line, coronary sinus siolation, CFAE ablation); Location: [facility]  . AV NODE ABLATION N/A 05/26/2014   Procedure: AV NODE ABLATION; Location: [facility]  . BIV PACEMAKER INSERTION CRT-P N/A 07/13/2016   Procedure: CRT-P BiV PACEMAKER  UPGRADE; Location: [facility]  . BREAST BIOPSY Right 2015   + for DCIS   . BREAST SURGERY    . CARDIAC CATHETERIZATION Bilateral 05/08/2016   Procedure: Right/Left Heart Cath and Coronary Angiography;  Surgeon: Marcina Millard, MD;  Location: [facility];  Service: Cardiovascular;  Laterality: Bilateral;  . CARDIOVERSION N/A 04/02/2012   Procedure: CARDIOVERSION; Location: [facility]  . CARDIOVERSION N/A 01/15/2013   Procedure: CARDIOVERSION; Location: [facility]  . CATARACT EXTRACTION W/PHACO Left 03/20/2017   Procedure: IOL EXCHANGE;  Surgeon: Lockie Mola, MD;  Location: [facility];  Service: Ophthalmology;  Laterality: Left;  . CHOLECYSTECTOMY    . COLONOSCOPY     patient reports several  . DILATION AND CURETTAGE OF UTERUS    . ESOPHAGOGASTRODUODENOSCOPY    . ESOPHAGOGASTRODUODENOSCOPY (EGD) WITH PROPOFOL N/A 12/13/2020   Procedure: ESOPHAGOGASTRODUODENOSCOPY (EGD) WITH PROPOFOL;  Surgeon: Regis Bill, MD;  Location: [facility];  Service: Endoscopy;  Laterality: N/A;  . KNEE ARTHROPLASTY Right 12/25/2021   Procedure: COMPUTER ASSISTED TOTAL KNEE ARTHROPLASTY;  Surgeon: Donato Heinz, MD;  Location: [facility];  Service: Orthopedics;  Laterality: Right;  .  PACEMAKER INSERTION N/A 05/28/2014   Procedure: PACEMAKER INSERTION (dual chamber); Location: [facility]  . PERIPHERAL IRIDOTOMY Left 03/20/2017   Procedure: PERIPHERAL IRIDECTOMY;  Surgeon: Lockie Mola, MD;  Location: [facility];  Service: Ophthalmology;  Laterality: Left;  . ROTATOR CUFF REPAIR Left   . TEE WITHOUT CARDIOVERSION N/A 05/16/2016   Procedure: TRANSESOPHAGEAL ECHOCARDIOGRAM (TEE);  Surgeon: Dalia Heading, MD;  Location: [facility];  Service: Cardiovascular;  Laterality: N/A;  . TONSILLECTOMY    . uterine polyp       Home Medications   Prior to Admission medications   Medication Sig Start Date End Date Taking? Authorizing Provider  Acetaminophen 500 MG coapsule Take by mouth.     [provider]  albuterol (VENTOLIN HFA) 108 (90 Base) MCG/ACT inhaler Inhale 2 puffs into the lungs every 4 (four) hours as needed. 07/02/23   Becky Augusta, NP  alendronate (FOSAMAX) 70 MG tablet Take by mouth. 05/14/23 05/13/24  [provider]  azelastine (ASTELIN) 0.1 % nasal spray Place into both nostrils as needed. Use in each nostril as directed    [provider]  benzonatate (TESSALON) 100 MG capsule Take 2 capsules (200 mg total) by mouth every 8 (eight) hours. 07/02/23   Becky Augusta, NP  esomeprazole (NEXIUM) 40 MG capsule Take by mouth.    [provider]  furosemide (LASIX) 40 MG tablet Take 40 mg by mouth 2 (two) times daily.  03/30/16   [provider]  gabapentin (NEURONTIN) 300 MG capsule Take 300 mg by mouth 3 (three) times daily.    [provider]  ipratropium (ATROVENT) 0.03 % nasal spray Place into the nose. 07/02/23 07/16/23  [provider]  isosorbide mononitrate (IMDUR) 30 MG 24 hr tablet Take by mouth. 04/24/23 04/23/24  [provider]  KLOR-CON M10 10 MEQ tablet Take 2 tablets by mouth 2 (two) times daily. 01/14/23   [provider]  metolazone (ZAROXOLYN) 5 MG tablet Take 5 mg by mouth once a week. Takes on Friday    [provider]  metoprolol succinate (TOPROL-XL) 25 MG 24 hr tablet Take 1 tablet by mouth daily. 02/13/23 02/13/24  [provider]  Multiple Vitamins-Minerals (PRESERVISION AREDS 2) CAPS Take 1 capsule by mouth 2 (two) times daily.    [provider]  nitroGLYCERIN (NITROSTAT) 0.4 MG SL tablet Place 0.4 mg under the tongue every 5 (five) minutes x 2 doses as needed for chest pain.  04/01/13   [provider]  nortriptyline (PAMELOR) 10 MG capsule Take by mouth.    [provider]  omeprazole (PRILOSEC) 20 MG capsule Take 40 mg by mouth 2 (two) times daily before a meal.    [provider]  Polyvinyl Alcohol-Povidone (REFRESH OP)  Apply 1 drop to eye daily as needed (dry eyes).    [provider]  potassium chloride (KLOR-CON) 10 MEQ tablet Take 10 mEq by mouth 3 (three) times daily.    [provider]  predniSONE (DELTASONE) 10 MG tablet Take 1 tablet by mouth daily. 01/28/23   [provider]  promethazine-dextromethorphan (PROMETHAZINE-DM) 6.25-15 MG/5ML syrup Take 5 mLs by mouth 4 (four) times daily as needed. 07/02/23   Becky Augusta, NP  rivaroxaban (XARELTO) 20 MG TABS tablet Take 20 mg by mouth daily after breakfast.    [provider]  rosuvastatin (CRESTOR) 10 MG tablet Take 10 mg by mouth daily.    [provider]  senna (SENOKOT) 8.6 MG TABS tablet Take 1  tablet by mouth daily as needed for mild constipation.    [provider]  Spacer/Aero-Holding Chambers (AEROCHAMBER MV) inhaler Use as instructed 07/02/23   Becky Augusta, NP  spironolactone (ALDACTONE) 25 MG tablet Take 25 mg by mouth daily.    [provider]  tiZANidine (ZANAFLEX) 2 MG tablet Take 2 mg by mouth 3 (three) times daily. 12/18/22 12/18/23  [provider]  traMADol (ULTRAM) 50 MG tablet Take 1 tablet (50 mg total) by mouth every 4 (four) hours as needed for moderate pain. 12/26/21   Lasandra Beech B, PA-C  triamcinolone ointment (KENALOG) 0.1 % Apply 1 application  topically as needed.    [provider]     Allergies  Amiodarone, Baclofen, Metoprolol, Pantoprazole, Silicone, and Tape   Family History   Family History  Problem Relation Age of Onset  . Breast cancer Mother   . Cancer Sister   . Cancer Brother   . Cancer Daughter   . Breast cancer Daughter   . Cancer Son      Physical Exam  Triage Vital Signs: ED Triage Vitals  Encounter Vitals Group     BP 09/09/23 1608 (!) 142/58     Systolic BP Percentile --      Diastolic BP Percentile --      Pulse Rate 09/09/23 1608 73     Resp 09/09/23 1608 18     Temp 09/09/23 1608 97.8 F (36.6 C)     Temp  Source 09/09/23 2016 Oral     SpO2 09/09/23 1608 100 %     Weight 09/09/23 1609 145 lb (65.8 kg)     Height 09/09/23 1609 5\' 9"  (1.753 m)     Head Circumference --      Peak Flow --      Pain Score 09/09/23 1609 0     Pain Loc --      Pain Education --      Exclude from Growth Chart --     Updated Vital Signs: BP (!) 149/53   Pulse 70   Temp 98.2 F (36.8 C) (Oral)   Resp 19   Ht 5\' 9"  (1.753 m)   Wt 65.8 kg   SpO2 99%   BMI 21.41 kg/m   {Only need to document appropriate and relevant physical exam:1} General: Awake, no distress. *** CV:  Good peripheral perfusion. *** Resp:  Normal effort. *** Abd:  No distention. *** Other:  ***   ED Results / Procedures / Treatments  Labs (all labs ordered are listed, but only abnormal results are displayed) Labs Reviewed  COMPREHENSIVE METABOLIC PANEL - Abnormal; Notable for the following components:      Result Value   Sodium 133 (*)    Chloride 91 (*)    Glucose, Bld 135 (*)    BUN 40 (*)    Creatinine, Ser 1.53 (*)    Total Protein 5.9 (*)    Alkaline Phosphatase 32 (*)    GFR, Estimated 32 (*)    All other components within normal limits  CBC WITH DIFFERENTIAL/PLATELET - Abnormal; Notable for the following components:   Abs Immature Granulocytes 0.11 (*)    All other components within normal limits  PROTIME-INR - Abnormal; Notable for the following components:   Prothrombin Time 20.3 (*)    INR 1.7 (*)    All other components within normal limits  APTT  HEMOGLOBIN AND HEMATOCRIT, BLOOD  TYPE AND SCREEN  TROPONIN I (HIGH SENSITIVITY)  EKG  ***   RADIOLOGY *** {You MUST document your own interpretation of imaging, as well as the fact that you reviewed the radiologist's report!:1}  Official radiology report(s): No results found.   PROCEDURES:  Critical Care performed: {CriticalCareYesNo:19197::"Yes, see critical care procedure note(s)","No"}  Procedures   MEDICATIONS ORDERED IN ED: Medications   sodium chloride 0.9 % bolus 500 mL (has no administration in time range)     IMPRESSION / MDM / ASSESSMENT AND PLAN / ED COURSE  I reviewed the triage vital signs and the nursing notes.                              Differential diagnosis includes, but is not limited to, ***  Patient's presentation is most consistent with {EM COPA:27473}  {If the patient is on the monitor, remove the brackets and asterisks on the sentence below and remember to document it as a Procedure as well. Otherwise delete the sentence below:1} {**The patient is on the cardiac monitor to evaluate for evidence of arrhythmia and/or significant heart rate changes.**}  {Remember to include, when applicable, any/all of the following data: independent review of imaging independent review of labs (comment specifically on pertinent positives and negatives) review of specific prior hospitalizations, PCP/specialist notes, etc. discuss meds given and prescribed document any discussion with consultants (including hospitalists) any clinical decision tools you used and why (PECARN, NEXUS, etc.) did you consider admitting the patient? document social determinants of health affecting patient's care (homelessness, inability to follow up in a timely fashion, etc) document any pre-existing conditions increasing risk on current visit (e.g. diabetes and HTN increasing danger of high-risk chest pain/ACS) describes what meds you gave (especially parenteral) and why any other interventions?:1}      FINAL CLINICAL IMPRESSION(S) / ED DIAGNOSES   Final diagnoses:  Rectal bleeding  Hematochezia     Rx / DC Orders   ED Discharge Orders     None        Note:  This document was prepared using Dragon voice recognition software and may include unintentional dictation errors.

## 2023-09-10 NOTE — Assessment & Plan Note (Signed)
 No acute issues suspected

## 2023-09-10 NOTE — Assessment & Plan Note (Addendum)
 Paroxysmal A-fib on Xarelto Holding Xarelto due to GI bleed

## 2023-09-10 NOTE — Assessment & Plan Note (Signed)
 Chronic long-term prednisone use Will continue prednisone.  GI prophylaxis with Protonix

## 2023-09-10 NOTE — Assessment & Plan Note (Addendum)
 Continue home antihypertensives with close BP monitoring in view of GI bleeding

## 2023-09-10 NOTE — ED Notes (Signed)
 Pt ambulatory with 1 person assist with her normal gait.

## 2023-09-10 NOTE — H&P (Addendum)
 History and Physical    Patient: Charlotte Glover DGU:[id number] DOB: 27-Sep-1927 DOA: 09/09/2023 DOS: the patient was seen and examined on 09/10/2023 PCP: Lynnea Ferrier, MD  Patient coming from: Home  Chief Complaint:  Chief Complaint  Patient presents with   Rectal Bleeding    HPI: Charlotte Glover is a 89 y.o. female with medical history significant for Paroxysmal A-fib s/p ablation 2014 on Xarelto, CHB s/p pacemaker , GCA on chronic prednisone, CKD 3, HTN, CAD, mitral and tricuspid regurgitation and prior breast cancer who presents to the ED with rectal bleeding and diarrhea that started at 11 AM on 3/17.  She had about 5 episodes prior to arrival to the ED.  She denies lightheadedness,  palpitations.  Did briefly complain of chest pain prior to arrival. no similar prior episodes.  History of endoscopy in 2022 to evaluate for dysphagia.  Both patient and daughter at bedside contributes to history. ED course and data review: Hemodynamically stable with normal BP and pulse Labs notable for slight downtrend in hemoglobin in 8 hours 12.5--> 12.4.  Platelets normal, INR 1.7 Troponin 42 Patient declines EKG per triage documentation Patient given 500 mL NS bolus Hospitalist consulted for admission.   Review of Systems: As mentioned in the history of present illness. All other systems reviewed and are negative.  Past Medical History:  Diagnosis Date   (HFpEF) heart failure with preserved ejection fraction (HCC) 06/21/2020   a.) TTE 06/21/2020: EF >55%, RAE, G1DD   Amaurosis fugax of left eye    Arthritis    Atrial fibrillation (HCC)    a.) Dx'd in 2009; b.) CHA2DS2-VASc = 6 (age x 2, sex, CHF, HTN, prior MI); c.) s/p PVI 09/31/2013; DCCV (200 J x 1) 04/02/2012, repeat PVI with roof line, coronary sinus isolation, and CFAE ablation 01/02/2013; DCCV 01/18/2013 (200 J x 1); AV nodal ablation 05/28/2014;  d.) rate/rhythm maintained without pharmacological intervention; chronically  anticoagulated using rivaroxaban   Cardiac arrest (HCC) 10/2009   a.) witnessed arrest at home; husband performed CPR; ROSC achieved in the field. Admitted for workup --> etiology of CV event undetermined; felt to be secondary to "electrolyte abnormalities".   Cardiac murmur    CHB (complete heart block) (HCC)    a.) s/p PPM 05/28/2014; b.) CRT-P upgrade 07/13/2016.   Chickenpox    CKD (chronic kidney disease), stage III (HCC)    Coronary artery disease    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/13/2013   a.) grade II; ER/PR (+)   Dyspnea    Edema    GERD (gastroesophageal reflux disease)    Granulomatous disease (HCC)    a.) spleen   Hiatal hernia    HLD (hyperlipidemia)    HOH (hard of hearing)    Hypertension 01/23/2012   Jackhammer esophagus    Long term current use of antithrombotics/antiplatelets    a.) rivaroxaban   Melanoma of lower leg, left (HCC)    Meningioma, multiple (HCC) 07/14/2019   a.) MRI brain 07/14/2019: 1.2 x 1.3 x 1.3 cm midline posterior fossa meningioma inferior to the cerebellar vertex; 7 x 3 mm dural based meningioma along frontal convexity   NSTEMI (non-ST elevated myocardial infarction) (HCC) 08/2009   Presence of permanent cardiac pacemaker    Pulmonary HTN (HCC) 05/08/2016   a.) R/LHC 05/08/2016: EF >55, LVEDP 15, mean PA 38, AO sat 97, RVSP 50, CO 3 L/min   S/P ablation of atrial fibrillation    Shingles  SSS (sick sinus syndrome) (HCC)    a.) CRT-P in place   Valvular heart disease 02/08/2012   a.) cMRI 02/08/2012: EF 50-55, mild-mod AR, mod MR; b.) TEE 02/26/2012: EF >55, mild MR/AR/TR; c.) R/LHC 05/08/2016: sev MR; d.) TEE 06/14/2016: EF >55, sev MR, mod AR/TR; e.) TTE 10/05/2016: EF >55, triv PR, mod AR/MR/TR; f.) TTE 04/22/2018: EF >55, mild MR/TR/PR, mod AR; g.) TTE 11/27/2019: EF 50, mild PR, mod AR/MR/TR; h.) TTE 06/21/2020: EF >55, triv PR, mod AR/MR/TR   Wears hearing aid in both ears    Past Surgical History:  Procedure  Laterality Date   ANTERIOR VITRECTOMY Left 03/20/2017   Procedure: ANTERIOR VITRECTOMY;  Surgeon: Lockie Mola, MD;  Location: [facility];  Service: Ophthalmology;  Laterality: Left;  IVA TOPICAL LEFT   ARTERY BIOPSY Right 04/08/2019   Procedure: BIOPSY TEMPORAL ARTERY;  Surgeon: Annice Needy, MD;  Location: [facility];  Service: Vascular;  Laterality: Right;   ATRIAL FIBRILLATION ABLATION N/A 03/24/2012   Procedure: ATRIAL FIBRILLATION ABLATION (PVI); Location: [facility]   ATRIAL FIBRILLATION ABLATION N/A 01/02/2013   Procedure: ATRIAL FIBRILLATION ABLATION (PVI, roof line, coronary sinus siolation, CFAE ablation); Location: [facility]   AV NODE ABLATION N/A 05/26/2014   Procedure: AV NODE ABLATION; Location: [facility]   BIV PACEMAKER INSERTION CRT-P N/A 07/13/2016   Procedure: CRT-P BiV PACEMAKER UPGRADE; Location: [facility]   BREAST BIOPSY Right 2015   + for DCIS    BREAST SURGERY     CARDIAC CATHETERIZATION Bilateral 05/08/2016   Procedure: Right/Left Heart Cath and Coronary Angiography;  Surgeon: Marcina Millard, MD;  Location: [facility];  Service: Cardiovascular;  Laterality: Bilateral;   CARDIOVERSION N/A 04/02/2012   Procedure: CARDIOVERSION; Location: [facility]   CARDIOVERSION N/A 01/15/2013   Procedure: CARDIOVERSION; Location: [facility]   CATARACT EXTRACTION W/PHACO Left 03/20/2017   Procedure: IOL EXCHANGE;  Surgeon: Lockie Mola, MD;  Location: [facility];  Service: Ophthalmology;  Laterality: Left;   CHOLECYSTECTOMY     COLONOSCOPY     patient reports several   DILATION AND CURETTAGE OF UTERUS     ESOPHAGOGASTRODUODENOSCOPY     ESOPHAGOGASTRODUODENOSCOPY (EGD) WITH PROPOFOL N/A 12/13/2020   Procedure: ESOPHAGOGASTRODUODENOSCOPY (EGD) WITH PROPOFOL;  Surgeon: Regis Bill, MD;  Location: [facility];  Service: Endoscopy;  Laterality: N/A;   KNEE ARTHROPLASTY Right 12/25/2021   Procedure: COMPUTER ASSISTED TOTAL KNEE ARTHROPLASTY;  Surgeon: Donato Heinz, MD;  Location: [facility];  Service: Orthopedics;  Laterality: Right;   PACEMAKER INSERTION N/A 05/28/2014   Procedure: PACEMAKER INSERTION (dual chamber); Location: [facility]   PERIPHERAL IRIDOTOMY Left 03/20/2017   Procedure: PERIPHERAL IRIDECTOMY;  Surgeon: Lockie Mola, MD;  Location: [facility];  Service: Ophthalmology;  Laterality: Left;   ROTATOR CUFF REPAIR Left    TEE WITHOUT CARDIOVERSION N/A 05/16/2016   Procedure: TRANSESOPHAGEAL ECHOCARDIOGRAM (TEE);  Surgeon: Dalia Heading, MD;  Location: [facility];  Service: Cardiovascular;  Laterality: N/A;   TONSILLECTOMY     uterine polyp     Social History:  reports that she has never smoked. She has never used smokeless tobacco. She reports current alcohol use of about 7.0 standard drinks of alcohol per week. She reports that she does not use drugs.  Allergies  Allergen Reactions   Amiodarone Other (See Comments)    Bradycardia and sinus pauses   Baclofen Other (See Comments)    Double vision  Vision disturbances  Double vision    Vision disturbances   Metoprolol Rash  Pantoprazole Rash   Silicone Rash    Tape causes a skin rash.  ECG electrodes cause skin irritation when left on for prolonged period of time.   Tape Rash    Family History  Problem Relation Age of Onset   Breast cancer Mother    Cancer Sister    Cancer Brother    Cancer Daughter    Breast cancer Daughter    Cancer Son     Prior to Admission medications   Medication Sig Start Date End Date Taking? Authorizing Provider  Acetaminophen 500 MG coapsule Take by mouth.    [provider]  albuterol (VENTOLIN HFA) 108 (90 Base) MCG/ACT inhaler Inhale 2 puffs into the lungs every 4 (four) hours as needed. 07/02/23   Becky Augusta, NP  alendronate (FOSAMAX) 70 MG tablet Take by mouth. 05/14/23 05/13/24  [provider]  azelastine (ASTELIN) 0.1 % nasal spray Place into both nostrils as needed. Use in each nostril as directed     [provider]  benzonatate (TESSALON) 100 MG capsule Take 2 capsules (200 mg total) by mouth every 8 (eight) hours. 07/02/23   Becky Augusta, NP  esomeprazole (NEXIUM) 40 MG capsule Take by mouth.    [provider]  furosemide (LASIX) 40 MG tablet Take 40 mg by mouth 2 (two) times daily.  03/30/16   [provider]  gabapentin (NEURONTIN) 300 MG capsule Take 300 mg by mouth 3 (three) times daily.    [provider]  ipratropium (ATROVENT) 0.03 % nasal spray Place into the nose. 07/02/23 07/16/23  [provider]  isosorbide mononitrate (IMDUR) 30 MG 24 hr tablet Take by mouth. 04/24/23 04/23/24  [provider]  KLOR-CON M10 10 MEQ tablet Take 2 tablets by mouth 2 (two) times daily. 01/14/23   [provider]  metolazone (ZAROXOLYN) 5 MG tablet Take 5 mg by mouth once a week. Takes on Friday    [provider]  metoprolol succinate (TOPROL-XL) 25 MG 24 hr tablet Take 1 tablet by mouth daily. 02/13/23 02/13/24  [provider]  Multiple Vitamins-Minerals (PRESERVISION AREDS 2) CAPS Take 1 capsule by mouth 2 (two) times daily.    [provider]  nitroGLYCERIN (NITROSTAT) 0.4 MG SL tablet Place 0.4 mg under the tongue every 5 (five) minutes x 2 doses as needed for chest pain.  04/01/13   [provider]  nortriptyline (PAMELOR) 10 MG capsule Take by mouth.    [provider]  omeprazole (PRILOSEC) 20 MG capsule Take 40 mg by mouth 2 (two) times daily before a meal.    [provider]  Polyvinyl Alcohol-Povidone (REFRESH OP) Apply 1 drop to eye daily as needed (dry eyes).    [provider]  potassium chloride (KLOR-CON) 10 MEQ tablet Take 10 mEq by mouth 3 (three) times daily.    [provider]  predniSONE (DELTASONE) 10 MG tablet Take 1 tablet by mouth daily. 01/28/23   [provider]  promethazine-dextromethorphan (PROMETHAZINE-DM) 6.25-15 MG/5ML syrup Take 5 mLs  by mouth 4 (four) times daily as needed. 07/02/23   Becky Augusta, NP  rivaroxaban (XARELTO) 20 MG TABS tablet Take 20 mg by mouth daily after breakfast.    [provider]  rosuvastatin (CRESTOR) 10 MG tablet Take 10 mg by mouth daily.    [provider]  senna (SENOKOT) 8.6 MG TABS tablet Take 1 tablet by mouth daily as needed for mild constipation.    [provider]  Spacer/Aero-Holding  Chambers (AEROCHAMBER MV) inhaler Use as instructed 07/02/23   Becky Augusta, NP  spironolactone (ALDACTONE) 25 MG tablet Take 25 mg by mouth daily.    [provider]  tiZANidine (ZANAFLEX) 2 MG tablet Take 2 mg by mouth 3 (three) times daily. 12/18/22 12/18/23  [provider]  traMADol (ULTRAM) 50 MG tablet Take 1 tablet (50 mg total) by mouth every 4 (four) hours as needed for moderate pain. 12/26/21   Lasandra Beech B, PA-C  triamcinolone ointment (KENALOG) 0.1 % Apply 1 application  topically as needed.    [provider]    Physical Exam: Vitals:   09/09/23 2016 09/09/23 2300 09/10/23 0100 09/10/23 0135  BP: (!) 151/71 (!) 149/53 (!) 136/50   Pulse: 71 70 70   Resp: 19     Temp: 98.2 F (36.8 C)   98.1 F (36.7 C)  TempSrc: Oral   Oral  SpO2: 100% 99% 100%   Weight:      Height:       Physical Exam Vitals and nursing note reviewed.  Constitutional:      General: She is not in acute distress. HENT:     Head: Normocephalic and atraumatic.  Cardiovascular:     Rate and Rhythm: Normal rate and regular rhythm.     Heart sounds: Normal heart sounds.  Pulmonary:     Effort: Pulmonary effort is normal.     Breath sounds: Normal breath sounds.  Abdominal:     Palpations: Abdomen is soft.     Tenderness: There is no abdominal tenderness.  Neurological:     Mental Status: Mental status is at baseline.     Labs on Admission: I have personally reviewed following labs and imaging studies  CBC: Recent Labs  Lab 09/09/23 1611 09/10/23 0045   WBC 8.8  --   NEUTROABS 7.4  --   HGB 12.5 11.4*  HCT 37.8 34.4*  MCV 95.0  --   PLT 241  --    Basic Metabolic Panel: Recent Labs  Lab 09/09/23 1611  NA 133*  K 4.3  CL 91*  CO2 30  GLUCOSE 135*  BUN 40*  CREATININE 1.53*  CALCIUM 9.5   GFR: Estimated Creatinine Clearance: 23.9 mL/min (A) (by C-G formula based on SCr of 1.53 mg/dL (H)). Liver Function Tests: Recent Labs  Lab 09/09/23 1611  AST 29  ALT 23  ALKPHOS 32*  BILITOT 1.2  PROT 5.9*  ALBUMIN 3.8   No results for input(s): "LIPASE", "AMYLASE" in the last 168 hours. No results for input(s): "AMMONIA" in the last 168 hours. Coagulation Profile: Recent Labs  Lab 09/09/23 1611  INR 1.7*   Cardiac Enzymes: No results for input(s): "CKTOTAL", "CKMB", "CKMBINDEX", "TROPONINI" in the last 168 hours. BNP (last 3 results) No results for input(s): "PROBNP" in the last 8760 hours. HbA1C: No results for input(s): "HGBA1C" in the last 72 hours. CBG: No results for input(s): "GLUCAP" in the last 168 hours. Lipid Profile: No results for input(s): "CHOL", "HDL", "LDLCALC", "TRIG", "CHOLHDL", "LDLDIRECT" in the last 72 hours. Thyroid Function Tests: No results for input(s): "TSH", "T4TOTAL", "FREET4", "T3FREE", "THYROIDAB" in the last 72 hours. Anemia Panel: No results for input(s): "VITAMINB12", "FOLATE", "FERRITIN", "TIBC", "IRON", "RETICCTPCT" in the last 72 hours. Urine analysis:    Component Value Date/Time   COLORURINE YELLOW (A) 12/13/2021 0932   APPEARANCEUR CLOUDY (A) 12/13/2021 0932   APPEARANCEUR Clear 04/17/2012 1241   LABSPEC 1.014 12/13/2021 0932   LABSPEC 1.004 04/17/2012 1241  PHURINE 5.0 12/13/2021 0932   GLUCOSEU NEGATIVE 12/13/2021 0932   GLUCOSEU Negative 04/17/2012 1241   HGBUR NEGATIVE 12/13/2021 0932   BILIRUBINUR NEGATIVE 12/13/2021 0932   BILIRUBINUR Negative 04/17/2012 1241   KETONESUR NEGATIVE 12/13/2021 0932   PROTEINUR NEGATIVE 12/13/2021 0932   NITRITE NEGATIVE 12/13/2021  0932   LEUKOCYTESUR SMALL (A) 12/13/2021 0932   LEUKOCYTESUR Negative 04/17/2012 1241    Radiological Exams on Admission: No results found.   Data Reviewed: Relevant notes from primary care and specialist visits, past discharge summaries as available in EHR, including Care Everywhere. Prior diagnostic testing as pertinent to current admission diagnoses Updated medications and problem lists for reconciliation ED course, including vitals, labs, imaging, treatment and response to treatment Triage notes, nursing and pharmacy notes and ED provider's notes Notable results as noted in HPI   Assessment and Plan: * Hematochezia Patient with at least 5 episodes of rectal bleeding.  Briefly had abdominal pain at onset now resolved No recent colonoscopy.  Last EGD 2022 for dysphagia notable for tortuous esophagus Serial H&H and transfuse if needed.  Discussed with patient and agreeable if needed Will keep n.p.o. GI consult   GCA (giant cell arteritis) (HCC) Chronic long-term prednisone use Will continue prednisone.  GI prophylaxis with Protonix  Severe mitral regurgitation No acute issues suspected  Hypertension Continue home antihypertensives with close BP monitoring in view of GI bleeding  History of ductal carcinoma in situ (DCIS) of breast No acute issues suspected  S/P ablation of atrial fibrillation Paroxysmal A-fib on Xarelto Holding Xarelto due to GI bleed   Biventricular cardiac pacemaker in situ No acute issues suspected  Coronary artery disease Elevated troponin and history of NSTEMI Had brief chest pain prior to arrival now resolved.  Troponin 42 suspect demand.  No EKG as patient declined, will try to encourage to get EKG Continue GDMT with Imdur,  rosuvastatin.  Holding Xarelto        DVT prophylaxis: SCD  Consults: GI Dr. Tobi Bastos  Advance Care Planning:   Code Status: Prior   Family Communication: Daughter at bedside  Disposition Plan: Back to  previous home environment  Severity of Illness: The appropriate patient status for this patient is OBSERVATION. Observation status is judged to be reasonable and necessary in order to provide the required intensity of service to ensure the patient's safety. The patient's presenting symptoms, physical exam findings, and initial radiographic and laboratory data in the context of their medical condition is felt to place them at decreased risk for further clinical deterioration. Furthermore, it is anticipated that the patient will be medically stable for discharge from the hospital within 2 midnights of admission.   Author: Andris Baumann, MD 09/10/2023 1:41 AM  For on call review [email or web address].

## 2023-09-10 NOTE — ED Notes (Signed)
 Went in room to restart BP.  Pt complained previously of it being too tight and causing bruises.  Checked area and there was red petechiae from the cuff.  Pt stated to please place it higher than it was.  I did.  Started BP again and she yelled shortly and said  " no, no, no I can't take that.  It hurts too bad. I'm sorry I'm a wimp but I can't do it."  Removed BP cuff.

## 2023-09-10 NOTE — Progress Notes (Signed)
 PROGRESS NOTE    Charlotte Glover  WUJ:[id number] DOB: [birth year] DOA: 09/09/2023 PCP: Lynnea Ferrier, MD   Brief Narrative:  Charlotte Glover is a 89 y.o. female with medical history significant for Paroxysmal A-fib s/p ablation 2014 on Xarelto, CHB s/p pacemaker , GCA on chronic prednisone, CKD 3, HTN, CAD, mitral and tricuspid regurgitation and prior breast cancer who presents to the ED with rectal bleeding and diarrhea that started at 11 AM on 3/17.  Hospitalist called for admission, GI called in consult.   Assessment & Plan:   Principal Problem:   Hematochezia Active Problems:   Atrial fibrillation (HCC)   Coronary artery disease   Biventricular cardiac pacemaker in situ   CKD (chronic kidney disease) stage 3, GFR 30-59 ml/min (HCC)   S/P ablation of atrial fibrillation   H/O non-ST elevation myocardial infarction (NSTEMI)   History of ductal carcinoma in situ (DCIS) of breast   Hypertension   Long term current use of systemic steroids   Severe mitral regurgitation   GCA (giant cell arteritis) (HCC)   Acute symptomatic anemia in the setting of acute onset hematochezia -Patient reports 5+ episodes of rectal bleeding, " large" per herself -No recent colonoscopy, GI consulted, interim plan for colonoscopy tomorrow 09/11/2023 -In the interim patient's hemoglobin appears to have stabilized around 11 which is her baseline. -GI recommending clears with bowel prep, n.p.o. at midnight    GCA (giant cell arteritis) (HCC) Chronic long-term prednisone use -Patient reports daily prednisone ongoing, med rec does not confirm ongoing prednisone administration, will need to verify dose prior to administration   Severe mitral regurgitation No acute issues suspected   Hypertension Continue home antihypertensives with close BP monitoring in view of GI bleeding   History of ductal carcinoma in situ (DCIS) of breast No acute issues suspected   S/P ablation of atrial  fibrillation Paroxysmal A-fib on Xarelto Holding Xarelto due to GI bleed   Biventricular cardiac pacemaker in situ No acute issues suspected   Coronary artery disease Elevated troponin and history of NSTEMI Transient episode of chest pain, pleuritic at intake, resolved Troponin minimally elevated in the setting of symptomatic anemia Continue GDMT with Imdur,  rosuvastatin.  Holding Xarelto  DVT prophylaxis: SCDs Start: 09/10/23 0147   Code Status:   Code Status: Full Code  Family Communication: None present  Status is: Observation  Dispo: The patient is from: Home              Anticipated d/c is to: Home              Anticipated d/c date is: 24 to 48 hours              Patient currently not medically stable for discharge  Consultants:  GI  Procedures:  Colonoscopy  Antimicrobials:  None   Subjective: No acute issues/events overnight -   Objective: Vitals:   09/10/23 0600 09/10/23 0608 09/10/23 0700 09/10/23 0751  BP:    (!) 151/70  Pulse: 70  70   Resp: (!) 22  17   Temp:  (!) 97.5 F (36.4 C)    TempSrc:  Oral    SpO2: 100%  99%   Weight:      Height:        Intake/Output Summary (Last 24 hours) at 09/10/2023 0755 Last data filed at 09/10/2023 0230 Gross per 24 hour  Intake 500 ml  Output --  Net 500 ml   [employer]   09/09/23  1609  Weight: 65.8 kg    Examination:  General:  Pleasantly resting in bed, No acute distress. HEENT:  Normocephalic atraumatic.  Sclerae nonicteric, noninjected.  Extraocular movements intact bilaterally. Neck:  Without mass or deformity.  Trachea is midline. Lungs:  Clear to auscultate bilaterally without rhonchi, wheeze, or rales. Heart:  Regular rate and rhythm.  Without murmurs, rubs, or gallops. Abdomen:  Soft, nontender, nondistended.  Without guarding or rebound. Extremities: Without cyanosis, clubbing, edema, or obvious deformity. Skin:  Warm and dry, no erythema.  Data Reviewed: I have personally reviewed  following labs and imaging studies  CBC: Recent Labs  Lab 09/09/23 1611 09/10/23 0045  WBC 8.8  --   NEUTROABS 7.4  --   HGB 12.5 11.4*  HCT 37.8 34.4*  MCV 95.0  --   PLT 241  --    Basic Metabolic Panel: Recent Labs  Lab 09/09/23 1611  NA 133*  K 4.3  CL 91*  CO2 30  GLUCOSE 135*  BUN 40*  CREATININE 1.53*  CALCIUM 9.5   GFR: Estimated Creatinine Clearance: 23.9 mL/min (A) (by C-G formula based on SCr of 1.53 mg/dL (H)).  Liver Function Tests: Recent Labs  Lab 09/09/23 1611  AST 29  ALT 23  ALKPHOS 32*  BILITOT 1.2  PROT 5.9*  ALBUMIN 3.8   Coagulation Profile: Recent Labs  Lab 09/09/23 1611  INR 1.7*   No results found for this or any previous visit (from the past 240 hours).   Radiology Studies: No results found.  Scheduled Meds:  pantoprazole  40 mg Oral Daily   predniSONE  10 mg Oral Q breakfast   rosuvastatin  10 mg Oral Daily   Continuous Infusions:   LOS: 0 days   Time spent: 55 min  Azucena Fallen, DO [facility]  If 7PM-7AM, please contact night-coverage [email or web address]  09/10/2023, 7:55 AM

## 2023-09-10 NOTE — ED Notes (Signed)
 Pt with bright red blood in BM

## 2023-09-10 NOTE — Consult Note (Addendum)
 Midge Minium, MD [facility]  [address] [place], [place] [address] Phone: [phone] Fax : [phone]  Consultation  Referring Provider:     Dr. Para March Primary Care Physician:  Lynnea Ferrier, MD Primary Gastroenterologist:  Dr. Mia Creek         Reason for Consultation:     Rectal bleeding  Date of Admission:  09/09/2023 Date of Consultation:  09/10/2023         HPI:   Charlotte Glover is a 89 y.o. female who has a history of having an upper endoscopy with Botox injection and being on prednisone with A-fib status post ablation in 2014 and on Xarelto.  The patient cannot recall when her last colonoscopy was but did have an upper endoscopy in 2022.  The upper endoscopy reported torturous esophagus that was done for dysphagia.  There is also a dilation done with an 18 mm balloon at that time. The patient reports that she started to have rectal bleeding with about 5 episodes prior to admission to the emergency department.  She did not have any symptoms associated with this except for some mild abdominal cramping.  The patient's daughter is at the patient bedside collaborating the patient's story.  There was a consideration to discharge the patient this morning whereupon she had another bowel movement with significant bleeding.  The patient's hemoglobin has shown:  Component     Latest Ref Rng 02/11/2023 09/09/2023 09/10/2023  Hemoglobin     12.0 - 15.0 g/dL 13.0  86.5  9.9 (L)   Hemoglobin        11.4 (L)    The patient's last rectal bleeding was reported to be this morning and she has had no further bleeding. The patient is on prednisone for giant cell colitis and was treated with Protonix.  The patient Xarelto was also held.  Past Medical History:  Diagnosis Date   (HFpEF) heart failure with preserved ejection fraction (HCC) 06/21/2020   a.) TTE 06/21/2020: EF >55%, RAE, G1DD   Amaurosis fugax of left eye    Arthritis    Atrial fibrillation (HCC)    a.) Dx'd in  2009; b.) CHA2DS2-VASc = 6 (age x 2, sex, CHF, HTN, prior MI); c.) s/p PVI 09/31/2013; DCCV (200 J x 1) 04/02/2012, repeat PVI with roof line, coronary sinus isolation, and CFAE ablation 01/02/2013; DCCV 01/18/2013 (200 J x 1); AV nodal ablation 05/28/2014;  d.) rate/rhythm maintained without pharmacological intervention; chronically anticoagulated using rivaroxaban   Cardiac arrest (HCC) 10/2009   a.) witnessed arrest at home; husband performed CPR; ROSC achieved in the field. Admitted for workup --> etiology of CV event undetermined; felt to be secondary to "electrolyte abnormalities".   Cardiac murmur    CHB (complete heart block) (HCC)    a.) s/p PPM 05/28/2014; b.) CRT-P upgrade 07/13/2016.   Chickenpox    CKD (chronic kidney disease), stage III (HCC)    Coronary artery disease    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/13/2013   a.) grade II; ER/PR (+)   Dyspnea    Edema    GERD (gastroesophageal reflux disease)    Granulomatous disease (HCC)    a.) spleen   Hiatal hernia    HLD (hyperlipidemia)    HOH (hard of hearing)    Hypertension 01/23/2012   Jackhammer esophagus    Long term current use of antithrombotics/antiplatelets    a.) rivaroxaban   Melanoma of lower leg, left (  HCC)    Meningioma, multiple (HCC) 07/14/2019   a.) MRI brain 07/14/2019: 1.2 x 1.3 x 1.3 cm midline posterior fossa meningioma inferior to the cerebellar vertex; 7 x 3 mm dural based meningioma along frontal convexity   NSTEMI (non-ST elevated myocardial infarction) (HCC) 08/2009   Presence of permanent cardiac pacemaker    Pulmonary HTN (HCC) 05/08/2016   a.) R/LHC 05/08/2016: EF >55, LVEDP 15, mean PA 38, AO sat 97, RVSP 50, CO 3 L/min   S/P ablation of atrial fibrillation    Shingles    SSS (sick sinus syndrome) (HCC)    a.) CRT-P in place   Valvular heart disease 02/08/2012   a.) cMRI 02/08/2012: EF 50-55, mild-mod AR, mod MR; b.) TEE 02/26/2012: EF >55, mild MR/AR/TR; c.) R/LHC  05/08/2016: sev MR; d.) TEE 06/14/2016: EF >55, sev MR, mod AR/TR; e.) TTE 10/05/2016: EF >55, triv PR, mod AR/MR/TR; f.) TTE 04/22/2018: EF >55, mild MR/TR/PR, mod AR; g.) TTE 11/27/2019: EF 50, mild PR, mod AR/MR/TR; h.) TTE 06/21/2020: EF >55, triv PR, mod AR/MR/TR   Wears hearing aid in both ears     Past Surgical History:  Procedure Laterality Date   ANTERIOR VITRECTOMY Left 03/20/2017   Procedure: ANTERIOR VITRECTOMY;  Surgeon: Lockie Mola, MD;  Location: [facility];  Service: Ophthalmology;  Laterality: Left;  IVA TOPICAL LEFT   ARTERY BIOPSY Right 04/08/2019   Procedure: BIOPSY TEMPORAL ARTERY;  Surgeon: Annice Needy, MD;  Location: [facility];  Service: Vascular;  Laterality: Right;   ATRIAL FIBRILLATION ABLATION N/A 03/24/2012   Procedure: ATRIAL FIBRILLATION ABLATION (PVI); Location: [facility]   ATRIAL FIBRILLATION ABLATION N/A 01/02/2013   Procedure: ATRIAL FIBRILLATION ABLATION (PVI, roof line, coronary sinus siolation, CFAE ablation); Location: [facility]   AV NODE ABLATION N/A 05/26/2014   Procedure: AV NODE ABLATION; Location: [facility]   BIV PACEMAKER INSERTION CRT-P N/A 07/13/2016   Procedure: CRT-P BiV PACEMAKER UPGRADE; Location: [facility]   BREAST BIOPSY Right 2015   + for DCIS    BREAST SURGERY     CARDIAC CATHETERIZATION Bilateral 05/08/2016   Procedure: Right/Left Heart Cath and Coronary Angiography;  Surgeon: Marcina Millard, MD;  Location: [facility];  Service: Cardiovascular;  Laterality: Bilateral;   CARDIOVERSION N/A 04/02/2012   Procedure: CARDIOVERSION; Location: [facility]   CARDIOVERSION N/A 01/15/2013   Procedure: CARDIOVERSION; Location: [facility]   CATARACT EXTRACTION W/PHACO Left 03/20/2017   Procedure: IOL EXCHANGE;  Surgeon: Lockie Mola, MD;  Location: [facility];  Service: Ophthalmology;  Laterality: Left;   CHOLECYSTECTOMY     COLONOSCOPY     patient reports several   DILATION AND CURETTAGE OF UTERUS      ESOPHAGOGASTRODUODENOSCOPY     ESOPHAGOGASTRODUODENOSCOPY (EGD) WITH PROPOFOL N/A 12/13/2020   Procedure: ESOPHAGOGASTRODUODENOSCOPY (EGD) WITH PROPOFOL;  Surgeon: Regis Bill, MD;  Location: [facility];  Service: Endoscopy;  Laterality: N/A;   KNEE ARTHROPLASTY Right 12/25/2021   Procedure: COMPUTER ASSISTED TOTAL KNEE ARTHROPLASTY;  Surgeon: Donato Heinz, MD;  Location: [facility];  Service: Orthopedics;  Laterality: Right;   PACEMAKER INSERTION N/A 05/28/2014   Procedure: PACEMAKER INSERTION (dual chamber); Location: [facility]   PERIPHERAL IRIDOTOMY Left 03/20/2017   Procedure: PERIPHERAL IRIDECTOMY;  Surgeon: Lockie Mola, MD;  Location: [facility];  Service: Ophthalmology;  Laterality: Left;   ROTATOR CUFF REPAIR Left    TEE WITHOUT CARDIOVERSION N/A 05/16/2016   Procedure: TRANSESOPHAGEAL ECHOCARDIOGRAM (TEE);  Surgeon: Dalia Heading, MD;  Location: [facility];  Service: Cardiovascular;  Laterality:  N/A;   TONSILLECTOMY     uterine polyp      Prior to Admission medications   Medication Sig Start Date End Date Taking? Authorizing Provider  Acetaminophen 500 MG coapsule Take by mouth.    [provider]  albuterol (VENTOLIN HFA) 108 (90 Base) MCG/ACT inhaler Inhale 2 puffs into the lungs every 4 (four) hours as needed. 07/02/23   Becky Augusta, NP  alendronate (FOSAMAX) 70 MG tablet Take by mouth. 05/14/23 05/13/24  [provider]  azelastine (ASTELIN) 0.1 % nasal spray Place into both nostrils as needed. Use in each nostril as directed    [provider]  benzonatate (TESSALON) 100 MG capsule Take 2 capsules (200 mg total) by mouth every 8 (eight) hours. 07/02/23   Becky Augusta, NP  esomeprazole (NEXIUM) 40 MG capsule Take by mouth.    [provider]  furosemide (LASIX) 40 MG tablet Take 40 mg by mouth 2 (two) times daily.  03/30/16   [provider]  gabapentin (NEURONTIN) 300 MG capsule Take 300 mg by mouth 3 (three) times  daily.    [provider]  ipratropium (ATROVENT) 0.03 % nasal spray Place into the nose. 07/02/23 07/16/23  [provider]  isosorbide mononitrate (IMDUR) 30 MG 24 hr tablet Take by mouth. 04/24/23 04/23/24  [provider]  KLOR-CON M10 10 MEQ tablet Take 2 tablets by mouth 2 (two) times daily. 01/14/23   [provider]  metolazone (ZAROXOLYN) 5 MG tablet Take 5 mg by mouth once a week. Takes on Friday    [provider]  metoprolol succinate (TOPROL-XL) 25 MG 24 hr tablet Take 1 tablet by mouth daily. 02/13/23 02/13/24  [provider]  Multiple Vitamins-Minerals (PRESERVISION AREDS 2) CAPS Take 1 capsule by mouth 2 (two) times daily.    [provider]  nitroGLYCERIN (NITROSTAT) 0.4 MG SL tablet Place 0.4 mg under the tongue every 5 (five) minutes x 2 doses as needed for chest pain.  04/01/13   [provider]  nortriptyline (PAMELOR) 10 MG capsule Take by mouth.    [provider]  omeprazole (PRILOSEC) 20 MG capsule Take 40 mg by mouth 2 (two) times daily before a meal.    [provider]  Polyvinyl Alcohol-Povidone (REFRESH OP) Apply 1 drop to eye daily as needed (dry eyes).    [provider]  potassium chloride (KLOR-CON) 10 MEQ tablet Take 10 mEq by mouth 3 (three) times daily.    [provider]  predniSONE (DELTASONE) 10 MG tablet Take 1 tablet by mouth daily. 01/28/23   [provider]  promethazine-dextromethorphan (PROMETHAZINE-DM) 6.25-15 MG/5ML syrup Take 5 mLs by mouth 4 (four) times daily as needed. 07/02/23   Becky Augusta, NP  rivaroxaban (XARELTO) 20 MG TABS tablet Take 20 mg by mouth daily after breakfast.    [provider]  rosuvastatin (CRESTOR) 10 MG tablet Take 10 mg by mouth daily.    [provider]  senna (SENOKOT) 8.6 MG TABS tablet Take 1 tablet by mouth daily as needed for mild constipation.    [provider]  Spacer/Aero-Holding  Chambers (AEROCHAMBER MV) inhaler Use as instructed 07/02/23   Becky Augusta, NP  spironolactone (ALDACTONE) 25 MG tablet Take 25 mg by mouth daily.    [provider]  tiZANidine (ZANAFLEX) 2 MG tablet Take 2 mg by mouth 3 (three) times daily. 12/18/22 12/18/23  [provider]  traMADol (ULTRAM) 50 MG tablet Take 1 tablet (50 mg  total) by mouth every 4 (four) hours as needed for moderate pain. 12/26/21   Lasandra Beech B, PA-C  triamcinolone ointment (KENALOG) 0.1 % Apply 1 application  topically as needed.    [provider]    Family History  Problem Relation Age of Onset   Breast cancer Mother    Cancer Sister    Cancer Brother    Cancer Daughter    Breast cancer Daughter    Cancer Son      Social History   Tobacco Use   Smoking status: Never   Smokeless tobacco: Never  Vaping Use   Vaping status: Never Used  Substance Use Topics   Alcohol use: Yes    Alcohol/week: 7.0 standard drinks of alcohol    Types: 7 Shots of liquor per week    Comment:  (1 scotch daily)   Drug use: No    Allergies as of 09/09/2023 - Review Complete 09/09/2023  Allergen Reaction Noted   Amiodarone Other (See Comments) 07/11/2012   Baclofen Other (See Comments) 04/06/2019   Metoprolol Rash 11/28/2012   Pantoprazole Rash 03/13/2013   Silicone Rash 12/04/2013   Tape Rash 12/04/2013    Review of Systems:    All systems reviewed and negative except where noted in HPI.   Physical Exam:  Vital signs in last 24 hours: Temp:  [97.5 F (36.4 C)-98.2 F (36.8 C)] 97.7 F (36.5 C) (03/18 1157) Pulse Rate:  [70-73] 70 (03/18 1200) Resp:  [17-23] 18 (03/18 1200) BP: (136-151)/(50-119) 139/119 (03/18 1200) SpO2:  [98 %-100 %] 98 % (03/18 1200) Weight:  [65.8 kg] 65.8 kg (03/17 1609)   General:   Pleasant, cooperative in NAD Head:  Normocephalic and atraumatic. Eyes:   No icterus.   Conjunctiva pink. PERRLA. Ears:  Normal auditory acuity. Rectal:  Not performed. Msk:   Symmetrical without gross deformities.   Neurologic:  Alert and oriented x3;  grossly normal neurologically. Skin:  Intact without significant lesions or rashes. Psych:  Alert and cooperative. Normal affect.  LAB RESULTS: Recent Labs    09/09/23 1611 09/10/23 0045 09/10/23 0751  WBC 8.8  --   --   HGB 12.5 11.4* 9.9*  HCT 37.8 34.4*  --   PLT 241  --   --    BMET Recent Labs    09/09/23 1611  NA 133*  K 4.3  CL 91*  CO2 30  GLUCOSE 135*  BUN 40*  CREATININE 1.53*  CALCIUM 9.5   LFT Recent Labs    09/09/23 1611  PROT 5.9*  ALBUMIN 3.8  AST 29  ALT 23  ALKPHOS 32*  BILITOT 1.2   PT/INR Recent Labs    09/09/23 1611  LABPROT 20.3*  INR 1.7*    STUDIES: No results found.    Impression / Plan:   Assessment: Principal Problem:   Hematochezia Active Problems:   Atrial fibrillation (HCC)   Coronary artery disease   Biventricular cardiac pacemaker in situ   CKD (chronic kidney disease) stage 3, GFR 30-59 ml/min (HCC)   S/P ablation of atrial fibrillation   H/O non-ST elevation myocardial infarction (NSTEMI)   History of ductal carcinoma in situ (DCIS) of breast   Hypertension   Long term current use of systemic steroids   Severe mitral regurgitation   GCA (giant cell arteritis) (HCC)   Charlotte Glover is a 89 y.o. y/o female with rectal bleeding and drop in her hemoglobin.  The patient denies ever having rectal bleeding in the  past and has not had a colonoscopy in many years.  The patient and her daughter were explained the risks and benefits of a colonoscopy at her age.  Plan:  The patient would like to proceed with a colonoscopy and will be given a prep today for the colonoscopy to be done tomorrow.  The patient will be started on a clear liquid diet today and then kept n.p.o. at 5 AM.  The patient has been explained the plan and agrees with it.  PPI IV twice daily  Continue serial CBCs and transfuse PRN Avoid NSAIDs Maintain 2 large-bore  IV lines Please page GI with any acute hemodynamic changes, or signs of active GI bleeding   If the patient should have a significant GI bleed prior to having a colonoscopy then a CT angiography would be recommended to isolate the source of the bleeding.   Thank you for involving me in the care of this patient.      LOS: 0 days   Midge Minium, MD, MD. Clementeen Graham 09/10/2023, 2:35 PM,  Pager [phone] 7am-5pm  Check AMION for 5pm -7am coverage and on weekends   Note: This dictation was prepared with Dragon dictation along with smaller phrase technology. Any transcriptional errors that result from this process are unintentional.

## 2023-09-11 ENCOUNTER — Observation Stay: Admitting: Anesthesiology

## 2023-09-11 ENCOUNTER — Encounter: Payer: Self-pay | Admitting: Internal Medicine

## 2023-09-11 ENCOUNTER — Encounter
Admission: EM | Disposition: A | Discharge status: Home / Self Care | Payer: Self-pay | Source: Home / Self Care | Attending: Internal Medicine

## 2023-09-11 DIAGNOSIS — Z7902 Long term (current) use of antithrombotics/antiplatelets: Secondary | ICD-10-CM | POA: Diagnosis not present

## 2023-09-11 DIAGNOSIS — I10 Essential (primary) hypertension: Secondary | ICD-10-CM

## 2023-09-11 DIAGNOSIS — I495 Sick sinus syndrome: Secondary | ICD-10-CM | POA: Diagnosis present

## 2023-09-11 DIAGNOSIS — K635 Polyp of colon: Secondary | ICD-10-CM | POA: Diagnosis present

## 2023-09-11 DIAGNOSIS — I480 Paroxysmal atrial fibrillation: Secondary | ICD-10-CM | POA: Diagnosis present

## 2023-09-11 DIAGNOSIS — Z7901 Long term (current) use of anticoagulants: Secondary | ICD-10-CM | POA: Diagnosis not present

## 2023-09-11 DIAGNOSIS — Z9889 Other specified postprocedural states: Secondary | ICD-10-CM

## 2023-09-11 DIAGNOSIS — K625 Hemorrhage of anus and rectum: Principal | ICD-10-CM

## 2023-09-11 DIAGNOSIS — M316 Other giant cell arteritis: Secondary | ICD-10-CM | POA: Diagnosis present

## 2023-09-11 DIAGNOSIS — I2510 Atherosclerotic heart disease of native coronary artery without angina pectoris: Secondary | ICD-10-CM | POA: Diagnosis present

## 2023-09-11 DIAGNOSIS — K5730 Diverticulosis of large intestine without perforation or abscess without bleeding: Secondary | ICD-10-CM | POA: Diagnosis not present

## 2023-09-11 DIAGNOSIS — Z950 Presence of cardiac pacemaker: Secondary | ICD-10-CM

## 2023-09-11 DIAGNOSIS — I130 Hypertensive heart and chronic kidney disease with heart failure and stage 1 through stage 4 chronic kidney disease, or unspecified chronic kidney disease: Secondary | ICD-10-CM | POA: Diagnosis present

## 2023-09-11 DIAGNOSIS — K5731 Diverticulosis of large intestine without perforation or abscess with bleeding: Secondary | ICD-10-CM | POA: Diagnosis present

## 2023-09-11 DIAGNOSIS — I340 Nonrheumatic mitral (valve) insufficiency: Secondary | ICD-10-CM

## 2023-09-11 DIAGNOSIS — Z7952 Long term (current) use of systemic steroids: Secondary | ICD-10-CM

## 2023-09-11 DIAGNOSIS — N1831 Chronic kidney disease, stage 3a: Secondary | ICD-10-CM | POA: Diagnosis not present

## 2023-09-11 DIAGNOSIS — K921 Melena: Secondary | ICD-10-CM | POA: Diagnosis present

## 2023-09-11 DIAGNOSIS — I081 Rheumatic disorders of both mitral and tricuspid valves: Secondary | ICD-10-CM | POA: Diagnosis present

## 2023-09-11 DIAGNOSIS — K529 Noninfective gastroenteritis and colitis, unspecified: Secondary | ICD-10-CM | POA: Diagnosis present

## 2023-09-11 DIAGNOSIS — Z7983 Long term (current) use of bisphosphonates: Secondary | ICD-10-CM | POA: Diagnosis not present

## 2023-09-11 DIAGNOSIS — I442 Atrioventricular block, complete: Secondary | ICD-10-CM | POA: Diagnosis present

## 2023-09-11 DIAGNOSIS — I5032 Chronic diastolic (congestive) heart failure: Secondary | ICD-10-CM | POA: Diagnosis present

## 2023-09-11 DIAGNOSIS — Z8679 Personal history of other diseases of the circulatory system: Secondary | ICD-10-CM

## 2023-09-11 DIAGNOSIS — Z96651 Presence of right artificial knee joint: Secondary | ICD-10-CM | POA: Diagnosis present

## 2023-09-11 DIAGNOSIS — Z86000 Personal history of in-situ neoplasm of breast: Secondary | ICD-10-CM | POA: Diagnosis not present

## 2023-09-11 DIAGNOSIS — Z85820 Personal history of malignant melanoma of skin: Secondary | ICD-10-CM | POA: Diagnosis not present

## 2023-09-11 DIAGNOSIS — K641 Second degree hemorrhoids: Secondary | ICD-10-CM | POA: Diagnosis not present

## 2023-09-11 DIAGNOSIS — I252 Old myocardial infarction: Secondary | ICD-10-CM | POA: Diagnosis not present

## 2023-09-11 DIAGNOSIS — N183 Chronic kidney disease, stage 3 unspecified: Secondary | ICD-10-CM | POA: Diagnosis present

## 2023-09-11 DIAGNOSIS — K920 Hematemesis: Secondary | ICD-10-CM

## 2023-09-11 DIAGNOSIS — D649 Anemia, unspecified: Secondary | ICD-10-CM | POA: Diagnosis present

## 2023-09-11 DIAGNOSIS — E785 Hyperlipidemia, unspecified: Secondary | ICD-10-CM | POA: Diagnosis present

## 2023-09-11 HISTORY — PX: COLONOSCOPY: SHX5424

## 2023-09-11 LAB — BASIC METABOLIC PANEL
Anion gap: 17 — ABNORMAL HIGH (ref 5–15)
BUN: 42 mg/dL — ABNORMAL HIGH (ref 8–23)
CO2: 27 mmol/L (ref 22–32)
Calcium: 8.9 mg/dL (ref 8.9–10.3)
Chloride: 95 mmol/L — ABNORMAL LOW (ref 98–111)
Creatinine, Ser: 1.49 mg/dL — ABNORMAL HIGH (ref 0.44–1.00)
GFR, Estimated: 33 mL/min — ABNORMAL LOW (ref >60)
Glucose, Bld: 184 mg/dL — ABNORMAL HIGH (ref 70–99)
Potassium: 2.9 mmol/L — ABNORMAL LOW (ref 3.5–5.1)
Sodium: 139 mmol/L (ref 135–145)

## 2023-09-11 LAB — HEMOGLOBIN: Hemoglobin: 10 g/dL — ABNORMAL LOW (ref 12.0–15.0)

## 2023-09-11 SURGERY — COLONOSCOPY
Anesthesia: General

## 2023-09-11 MED ORDER — POTASSIUM CHLORIDE 10 MEQ/100ML IV SOLN: 10.0000 meq | INTRAVENOUS | Status: AC

## 2023-09-11 MED ORDER — PROPOFOL 500 MG/50ML IV EMUL
INTRAVENOUS | Status: DC | PRN
  Administered 2023-09-11: 100 ug/kg/min via INTRAVENOUS

## 2023-09-11 MED ORDER — SODIUM CHLORIDE 0.9 % IV SOLN: INTRAVENOUS | Status: DC

## 2023-09-11 MED ORDER — PROPOFOL 10 MG/ML IV BOLUS
INTRAVENOUS | Status: DC | PRN
Start: 2023-09-11 — End: 2023-09-11
  Administered 2023-09-11: 50 mg via INTRAVENOUS

## 2023-09-11 MED ORDER — LIDOCAINE 2% (20 MG/ML) 5 ML SYRINGE
INTRAMUSCULAR | Status: DC | PRN
Start: 2023-09-11 — End: 2023-09-11
  Administered 2023-09-11: 20 mg via INTRAVENOUS

## 2023-09-11 NOTE — OR Nursing (Signed)
 1525 - Patient contacted and was told to hold Xarelto per orders.

## 2023-09-11 NOTE — Discharge Summary (Signed)
 Physician Discharge Summary   Patient: Charlotte Glover MRN: [id number] DOB: Apr 26, 1928  Admit date:     09/09/2023  Discharge date: 09/11/23  Discharge Physician: Marcelino Duster   PCP: Lynnea Ferrier, MD   Recommendations at discharge:    PCP follow up in 1-2 days for repeat CBC.  Discharge Diagnoses: Principal Problem:   Hematochezia Active Problems:   Atrial fibrillation (HCC)   Coronary artery disease   Biventricular cardiac pacemaker in situ   CKD (chronic kidney disease) stage 3, GFR 30-59 ml/min (HCC)   S/P ablation of atrial fibrillation   H/O non-ST elevation myocardial infarction (NSTEMI)   History of ductal carcinoma in situ (DCIS) of breast   Hypertension   Long term current use of systemic steroids   Severe mitral regurgitation   GCA (giant cell arteritis) (HCC)   Rectal bleeding  Resolved Problems:   * No resolved hospital problems. *  Hospital Course: Charlotte Glover is a 89 y.o. female with medical history significant for Paroxysmal A-fib s/p ablation 2014 on Xarelto, CHB s/p pacemaker , GCA on chronic prednisone, CKD 3, HTN, CAD, mitral and tricuspid regurgitation and prior breast cancer who presents to the ED with rectal bleeding and diarrhea that started at 11 AM on 3/17.  She had about 5 episodes prior to arrival to the ED.   Assessment and Plan: * Hematochezia Multiple episodes of rectal bleeding.   Her Xarelto is held. Patient had colonoscopy which did show diverticulosis, blood in rectosigmoid, sigmoid and descending colon.  Nonbleeding internal hemorrhoids.  3 polyps in transverse colon and descending colon no resection done. Patient's hemoglobin around 10. Her abdominal discomfort now resolved Patient wishes to go home, and states that she will follow-up with PCP for repeat H&H.  Son at bedside understand if she continuously bleeds will bring her to ED.  Advised to hold Xarelto for next 2 days and resume with PCP recommendations.   Patient understands and agrees with the discharge plan.  GCA (giant cell arteritis) (HCC) Chronic long-term prednisone use Continue GI prophylaxis  Severe mitral regurgitation No acute issues suspected  Hypertension Continue home BP medications.  History of ductal carcinoma in situ (DCIS) of breast No acute issues suspected  S/P ablation of atrial fibrillation Paroxysmal A-fib on Xarelto Holding Xarelto due to GI bleed  Biventricular cardiac pacemaker in situ No acute issues suspected  Coronary artery disease Elevated troponin and history of NSTEMI Continue GDMT with Imdur,  rosuvastatin.  Hold Xarelto for now.       Consultants: GI Procedures performed: Colonoscopy Disposition: Home Diet recommendation:  Discharge Diet Orders (From admission, onward)     Start     Ordered   09/11/23 0000  Diet - low sodium heart healthy        09/11/23 1333           Cardiac diet DISCHARGE MEDICATION: Allergies as of 09/11/2023       Reactions   Amiodarone Other (See Comments)   Bradycardia and sinus pauses   Baclofen Other (See Comments)   Double vision Vision disturbances Double vision    Vision disturbances   Metoprolol Rash   Pantoprazole Rash   Silicone Rash   Tape causes a skin rash.  ECG electrodes cause skin irritation when left on for prolonged period of time.   Tape Rash        Medication List     STOP taking these medications    azelastine 0.1 % nasal spray  Commonly known as: ASTELIN   benzonatate 100 MG capsule Commonly known as: TESSALON   metoprolol succinate 25 MG 24 hr tablet Commonly known as: TOPROL-XL   nortriptyline 10 MG capsule Commonly known as: PAMELOR   promethazine-dextromethorphan 6.25-15 MG/5ML syrup Commonly known as: PROMETHAZINE-DM   tiZANidine 2 MG tablet Commonly known as: ZANAFLEX   traMADol 50 MG tablet Commonly known as: ULTRAM       TAKE these medications    acetaminophen 500 MG tablet Commonly  known as: TYLENOL Take 1,000 mg by mouth every 6 (six) hours as needed for pain or fever.   AeroChamber MV inhaler Use as instructed   albuterol 108 (90 Base) MCG/ACT inhaler Commonly known as: VENTOLIN HFA Inhale 2 puffs into the lungs every 4 (four) hours as needed.   alendronate 70 MG tablet Commonly known as: FOSAMAX Take 70 mg by mouth once a week.   esomeprazole 40 MG capsule Commonly known as: NEXIUM Take 40 mg by mouth daily.   furosemide 40 MG tablet Commonly known as: LASIX Take 40 mg by mouth 2 (two) times daily.   gabapentin 300 MG capsule Commonly known as: NEURONTIN Take 300 mg by mouth 3 (three) times daily.   ipratropium 0.03 % nasal spray Commonly known as: ATROVENT Place into the nose.   isosorbide mononitrate 30 MG 24 hr tablet Commonly known as: IMDUR Take 30 mg by mouth daily.   Klor-Con M10 10 MEQ tablet Generic drug: potassium chloride Take 2 tablets by mouth 2 (two) times daily.   metolazone 5 MG tablet Commonly known as: ZAROXOLYN Take 5 mg by mouth daily. Takes on Friday   nitroGLYCERIN 0.4 MG SL tablet Commonly known as: NITROSTAT Place 0.4 mg under the tongue every 5 (five) minutes x 2 doses as needed for chest pain.   predniSONE 10 MG tablet Commonly known as: DELTASONE Take 10 mg by mouth daily.   PreserVision AREDS 2 Caps Take 1 capsule by mouth 2 (two) times daily.   REFRESH OP Apply 1 drop to eye daily as needed (dry eyes).   rivaroxaban 20 MG Tabs tablet Commonly known as: XARELTO Take 20 mg by mouth daily after breakfast.   rosuvastatin 10 MG tablet Commonly known as: CRESTOR Take 10 mg by mouth daily.   senna 8.6 MG Tabs tablet Commonly known as: SENOKOT Take 1 tablet by mouth daily as needed for mild constipation.   spironolactone 25 MG tablet Commonly known as: ALDACTONE Take 25 mg by mouth 2 (two) times daily.   triamcinolone ointment 0.1 % Commonly known as: KENALOG Apply 1 application  topically as  needed.        Follow-up Information     Curtis Sites III, MD Follow up in 2 day(s).   Specialty: Internal Medicine Contact information: [address] [place] [address] [phone]                Discharge Exam: Charlotte Glover Weights   09/09/23 1609  Weight: 65.8 kg      09/11/2023   12:09 PM 09/11/2023   11:09 AM 09/11/2023   10:53 AM  Vitals with BMI  Systolic 188 186 213  Diastolic 59 59 65  Pulse  70 80    General - Elderly Caucasian female, no apparent distress HEENT - PERRLA, EOMI, atraumatic head, non tender sinuses. Lung - Clear, rales, rhonchi, wheezes. Heart - S1, S2 heard, no murmurs, rubs, trace pedal edema. Abdomen-soft, nontender, nondistended bowel sounds good Neuro -  Alert, awake and oriented x 3, non focal exam. Skin - Warm and dry.  Condition at discharge: stable  The results of significant diagnostics from this hospitalization (including imaging, microbiology, ancillary and laboratory) are listed below for reference.   Imaging Studies: No results found.  Microbiology: Results for orders placed or performed during the hospital encounter of 12/13/21  Surgical pcr screen     Status: None   Collection Time: 12/13/21  9:32 AM   Specimen: Nasal Mucosa; Nasal Swab  Result Value Ref Range Status   MRSA, PCR NEGATIVE NEGATIVE Final   Staphylococcus aureus NEGATIVE NEGATIVE Final    Comment: (NOTE) The Xpert SA Assay (FDA approved for NASAL specimens in patients 17 years of age and older), is one component of a comprehensive surveillance program. It is not intended to diagnose infection nor to guide or monitor treatment. Performed at [facility], [address] [place], [place] [address]     Labs: CBC: Recent Labs  Lab 09/09/23 1611 09/10/23 0045 09/10/23 0751 09/10/23 2328 09/11/23 0623  WBC 8.8  --   --   --   --   NEUTROABS 7.4  --   --   --   --   HGB 12.5 11.4* 9.9* 9.1* 10.0*  HCT 37.8  34.4*  --   --   --   MCV 95.0  --   --   --   --   PLT 241  --   --   --   --    Basic Metabolic Panel: Recent Labs  Lab 09/09/23 1611 09/11/23 0623  NA 133* 139  K 4.3 2.9*  CL 91* 95*  CO2 30 27  GLUCOSE 135* 184*  BUN 40* 42*  CREATININE 1.53* 1.49*  CALCIUM 9.5 8.9   Liver Function Tests: Recent Labs  Lab 09/09/23 1611  AST 29  ALT 23  ALKPHOS 32*  BILITOT 1.2  PROT 5.9*  ALBUMIN 3.8   CBG: No results for input(s): "GLUCAP" in the last 168 hours.  Discharge time spent: 35 minutes.  Signed: Marcelino Duster, MD [facility] 09/11/2023

## 2023-09-11 NOTE — Op Note (Addendum)
 [facility] Gastroenterology Patient Name: Charlotte Glover Procedure Date: 09/11/2023 9:34 AM MRN: [id number] Account #: [id number] Date of Birth: Oct 25, 1927 Admit Type: Inpatient Age: 89 Room: [facility] ENDO ROOM 1 Gender: Female Note Status: Finalized Instrument Name: Prentice Docker [id number] Procedure:             Colonoscopy Indications:           Hematochezia Providers:             Midge Minium MD, MD Referring MD:          No Local Md, MD (Referring MD) Medicines:             Propofol per Anesthesia Complications:         No immediate complications. Procedure:             Pre-Anesthesia Assessment:                        - Prior to the procedure, a History and Physical was                         performed, and patient medications and allergies were                         reviewed. The patient's tolerance of previous                         anesthesia was also reviewed. The risks and benefits                         of the procedure and the sedation options and risks                         were discussed with the patient. All questions were                         answered, and informed consent was obtained. Prior                         Anticoagulants: The patient has taken no anticoagulant                         or antiplatelet agents. ASA Grade Assessment: II - A                         patient with mild systemic disease. After reviewing                         the risks and benefits, the patient was deemed in                         satisfactory condition to undergo the procedure.                        After obtaining informed consent, the colonoscope was                         passed under direct vision. Throughout the procedure,  the patient's blood pressure, pulse, and oxygen                         saturations were monitored continuously. The                         Colonoscope was introduced through the anus and                          advanced to the the cecum, identified by appendiceal                         orifice and ileocecal valve. The colonoscopy was                         performed without difficulty. The patient tolerated                         the procedure well. The quality of the bowel                         preparation was good. Findings:      The perianal and digital rectal examinations were normal.      Multiple medium-mouthed and small-mouthed diverticula were found in the       entire colon.      Hematin (altered blood/coffee-ground-like material) was found in the       recto-sigmoid colon, in the sigmoid colon and in the descending colon.      Non-bleeding internal hemorrhoids were found during retroflexion. The       hemorrhoids were Grade II (internal hemorrhoids that prolapse but reduce       spontaneously).      Three sessile polyps were found in the transverse colon and ascending       colon. The polyps were small in size. Polypectomy was not attempted due       to the patient taking anticoagulation medication. Impression:            - Diverticulosis in the entire examined colon.                        - Blood in the recto-sigmoid colon, in the sigmoid                         colon and in the descending colon.                        - Non-bleeding internal hemorrhoids.                        - Three small polyps in the transverse colon and in                         the ascending colon. Resection not attempted.                        - No specimens collected. Recommendation:        - Discharge patient to home.                        -  Resume previous diet.                        - There was no old blood seen on the right but some                         signs of blood on the left without any source of                         active GI bleeding.                        The source is likely diverticuli bleeding.                        - If any further bleding then would need vascular vs.                          IR embolization. Procedure Code(s):     --- Professional ---                        [phone], Colonoscopy, flexible; diagnostic, including                         collection of specimen(s) by brushing or washing, when                         performed (separate procedure) Diagnosis Code(s):     --- Professional ---                        K92.1, Melena (includes Hematochezia)                        K92.2, Gastrointestinal hemorrhage, unspecified CPT copyright 2022 American Medical Association. All rights reserved. The codes documented in this report are preliminary and upon coder review may  be revised to meet current compliance requirements. Midge Minium MD, MD 09/11/2023 10:14:02 AM This report has been signed electronically. Number of Addenda: 0 Note Initiated On: 09/11/2023 9:34 AM Scope Withdrawal Time: 0 hours 6 minutes 39 seconds  Total Procedure Duration: 0 hours 18 minutes 41 seconds  Estimated Blood Loss:  Estimated blood loss: none.      [facility]

## 2023-09-11 NOTE — ED Notes (Signed)
 This RN gave report to Endoscopy RN

## 2023-09-11 NOTE — Anesthesia Postprocedure Evaluation (Signed)
 Anesthesia Post Note  Patient: Charlotte Glover  Procedure(s) Performed: COLONOSCOPY  Patient location during evaluation: Endoscopy Anesthesia Type: General Level of consciousness: awake and alert Pain management: pain level controlled Vital Signs Assessment: post-procedure vital signs reviewed and stable Respiratory status: spontaneous breathing, nonlabored ventilation, respiratory function stable and patient connected to nasal cannula oxygen Cardiovascular status: blood pressure returned to baseline and stable Postop Assessment: no apparent nausea or vomiting Anesthetic complications: no   No notable events documented.   Last Vitals:  Vitals:   09/11/23 1053 09/11/23 1109  BP: (!) 187/65 (!) 186/59  Pulse: 80 70  Resp: 20 13  Temp:    SpO2: 100% 100%    Last Pain:  Vitals:   09/11/23 1109  TempSrc:   PainSc: Asleep                 Cleda Mccreedy Jadarian Mckay

## 2023-09-11 NOTE — Transfer of Care (Signed)
 Immediate Anesthesia Transfer of Care Note  Patient: Charlotte Glover  Procedure(s) Performed: COLONOSCOPY  Patient Location: Endoscopy Unit  Anesthesia Type:General  Level of Consciousness: awake and alert   Airway & Oxygen Therapy: Patient Spontanous Breathing  Post-op Assessment: Report given to RN and Post -op Vital signs reviewed and stable  Post vital signs: Reviewed  Last Vitals:  Vitals Value Taken Time  BP    Temp    Pulse    Resp    SpO2      Last Pain:  Vitals:   09/11/23 0922  TempSrc: Temporal  PainSc:          Complications: No notable events documented.

## 2023-09-11 NOTE — Anesthesia Preprocedure Evaluation (Signed)
 Anesthesia Evaluation  Patient identified by MRN, date of birth, ID band Patient awake    Reviewed: Allergy & Precautions, NPO status , Patient's Chart, lab work & pertinent test results  History of Anesthesia Complications Negative for: history of anesthetic complications  Airway Mallampati: III  TM Distance: <3 FB Neck ROM: full    Dental  (+) Chipped, Poor Dentition, Missing   Pulmonary neg pulmonary ROS, neg shortness of breath   Pulmonary exam normal        Cardiovascular Exercise Tolerance: Good hypertension, + CAD and + Past MI  Normal cardiovascular exam+ dysrhythmias + pacemaker + Valvular Problems/Murmurs      Neuro/Psych  Headaches  Neuromuscular disease  negative psych ROS   GI/Hepatic Neg liver ROS, hiatal hernia,GERD  Controlled,,  Endo/Other  negative endocrine ROS    Renal/GU Renal disease  negative genitourinary   Musculoskeletal   Abdominal   Peds  Hematology negative hematology ROS (+)   Anesthesia Other Findings Past Medical History: 06/21/2020: (HFpEF) heart failure with preserved ejection fraction  (HCC)     Comment:  a.) TTE 06/21/2020: EF >55%, RAE, G1DD No date: Amaurosis fugax of left eye No date: Arthritis No date: Atrial fibrillation (HCC)     Comment:  a.) Dx'd in 2009; b.) CHA2DS2-VASc = 6 (age x 2, sex,               CHF, HTN, prior MI); c.) s/p PVI 09/31/2013; DCCV (200 J               x 1) 04/02/2012, repeat PVI with roof line, coronary               sinus isolation, and CFAE ablation 01/02/2013; DCCV               01/18/2013 (200 J x 1); AV nodal ablation 05/28/2014;                d.) rate/rhythm maintained without pharmacological               intervention; chronically anticoagulated using               rivaroxaban 10/2009: Cardiac arrest (HCC)     Comment:  a.) witnessed arrest at home; husband performed CPR;               ROSC achieved in the field. Admitted for workup  -->               etiology of CV event undetermined; felt to be secondary               to "electrolyte abnormalities". No date: Cardiac murmur No date: CHB (complete heart block) (HCC)     Comment:  a.) s/p PPM 05/28/2014; b.) CRT-P upgrade 07/13/2016. 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 No date: Long term current use of antithrombotics/antiplatelets     Comment:  a.) rivaroxaban No date: Melanoma of lower leg, left (HCC) 07/14/2019: Meningioma, multiple (HCC)     Comment:  a.) MRI brain 07/14/2019: 1.2  x 1.3 x 1.3 cm midline               posterior fossa meningioma inferior to the cerebellar               vertex; 7 x 3 mm dural based meningioma along frontal               convexity 08/2009: NSTEMI (non-ST elevated myocardial infarction) (HCC) No date: Presence of permanent cardiac pacemaker 05/08/2016: Pulmonary HTN (HCC)     Comment:  a.) R/LHC 05/08/2016: EF >55, LVEDP 15, mean PA 38, AO               sat 97, RVSP 50, CO 3 L/min No date: S/P ablation of atrial fibrillation No date: Shingles No date: SSS (sick sinus syndrome) (HCC)     Comment:  a.) CRT-P in place 02/08/2012: Valvular heart disease     Comment:  a.) cMRI 02/08/2012: EF 50-55, mild-mod AR, mod MR; b.)               TEE 02/26/2012: EF >55, mild MR/AR/TR; c.) R/LHC               05/08/2016: sev MR; d.) TEE 06/14/2016: EF >55, sev MR,               mod AR/TR; e.) TTE 10/05/2016: EF >55, triv PR, mod               AR/MR/TR; f.) TTE 04/22/2018: EF >55, mild MR/TR/PR,  mod               AR; g.) TTE 11/27/2019: EF 50, mild PR, mod AR/MR/TR; h.)              TTE 06/21/2020: EF >55, triv PR, mod AR/MR/TR No date: Wears hearing aid in both ears  Past Surgical History: 03/20/2017: ANTERIOR VITRECTOMY; Left     Comment:  Procedure: ANTERIOR VITRECTOMY;  Surgeon: Lockie Mola, MD;  Location: [facility];  Service:               Ophthalmology;  Laterality: Left;  IVA TOPICAL LEFT 04/08/2019: ARTERY BIOPSY; Right     Comment:  Procedure: BIOPSY TEMPORAL ARTERY;  Surgeon: Annice Needy, MD;  Location: [facility] OR[facility];  Service: Vascular;                Laterality: Right; 03/24/2012: ATRIAL FIBRILLATION ABLATION; N/A     Comment:  Procedure: ATRIAL FIBRILLATION ABLATION (PVI); Location:              [facility] 01/02/2013: ATRIAL FIBRILLATION ABLATION; N/A     Comment:  Procedure: ATRIAL FIBRILLATION ABLATION (PVI, roof line,              coronary sinus siolation, CFAE ablation); Location: [facility] 05/26/2014: AV NODE ABLATION; N/A     Comment:  Procedure: AV NODE ABLATION; Location: [facility] 07/13/2016: BIV PACEMAKER INSERTION CRT-P; N/A     Comment:  Procedure: CRT-P BiV PACEMAKER UPGRADE; Location: [facility] 2015: BREAST BIOPSY; Right     Comment:  + for DCIS  No date: BREAST SURGERY 05/08/2016: CARDIAC CATHETERIZATION; Bilateral     Comment:  Procedure: Right/Left Heart Cath and Coronary               Angiography;  Surgeon: Marcina Millard, MD;  Location: [facility];  Service: Cardiovascular;              Laterality: Bilateral; 04/02/2012: CARDIOVERSION; N/A     Comment:  Procedure: CARDIOVERSION; Location: [facility] 01/15/2013: CARDIOVERSION; N/A     Comment:  Procedure: CARDIOVERSION; Location: [facility] 03/20/2017: CATARACT EXTRACTION W/PHACO; Left     Comment:  Procedure: IOL EXCHANGE;  Surgeon: Lockie Mola,              MD;  Location: [facility];  Service:               Ophthalmology;   Laterality: Left; No date: CHOLECYSTECTOMY No date: COLONOSCOPY     Comment:  patient reports several No date: DILATION AND CURETTAGE OF UTERUS No date: ESOPHAGOGASTRODUODENOSCOPY 12/13/2020: ESOPHAGOGASTRODUODENOSCOPY (EGD) WITH PROPOFOL; N/A     Comment:  Procedure: ESOPHAGOGASTRODUODENOSCOPY (EGD) WITH               PROPOFOL;  Surgeon: Regis Bill, MD;  Location:               [facility] END[facility];  Service: Endoscopy;  Laterality: N/A; 12/25/2021: KNEE ARTHROPLASTY; Right     Comment:  Procedure: COMPUTER ASSISTED TOTAL KNEE ARTHROPLASTY;                Surgeon: Donato Heinz, MD;  Location: [facility] OR[facility];                Service: Orthopedics;  Laterality: Right; 05/28/2014: PACEMAKER INSERTION; N/A     Comment:  Procedure: PACEMAKER INSERTION (dual chamber); Location:              [facility] 03/20/2017: PERIPHERAL IRIDOTOMY; Left     Comment:  Procedure: PERIPHERAL IRIDECTOMY;  Surgeon: Lockie Mola, MD;  Location: [facility];  Service:               Ophthalmology;  Laterality: Left; No date: ROTATOR CUFF REPAIR; Left 05/16/2016: TEE WITHOUT CARDIOVERSION; N/A     Comment:  Procedure: TRANSESOPHAGEAL ECHOCARDIOGRAM (TEE);                Surgeon: Dalia Heading, MD;  Location: [facility] OR[facility];                Service: Cardiovascular;  Laterality: N/A; No date: TONSILLECTOMY No date: uterine polyp  BMI    Body Mass Index: 21.41 kg/m      Reproductive/Obstetrics negative OB ROS                             Anesthesia Physical Anesthesia Plan  ASA: 4  Anesthesia Plan: General   Post-op Pain Management:    Induction: Intravenous  PONV Risk Score and Plan: Propofol infusion and TIVA  Airway Management Planned: Natural Airway and Nasal Cannula  Additional Equipment:   Intra-op Plan:   Post-operative Plan:   Informed Consent: I have reviewed the patients History and Physical, chart, labs and discussed the procedure including  the risks, benefits and alternatives for the proposed anesthesia with the patient or authorized representative who has indicated his/her understanding and acceptance.     Dental Advisory Given  Plan Discussed with: Anesthesiologist, CRNA and Surgeon  Anesthesia Plan Comments: (Patient consented for risks of anesthesia including but not limited to:  - adverse reactions to medications - risk of airway placement if required - damage to eyes, teeth,  lips or other oral mucosa - nerve damage due to positioning  - sore throat or hoarseness - Damage to heart, brain, nerves, lungs, other parts of body or loss of life  Patient voiced understanding and assent.)       Anesthesia Quick Evaluation

## 2023-09-15 ENCOUNTER — Inpatient Hospital Stay
Admission: EM | Admit: 2023-09-15 | Discharge: 2023-09-16 | DRG: 811 | Disposition: A | Discharge status: Home Health | Attending: Internal Medicine | Admitting: Internal Medicine

## 2023-09-15 ENCOUNTER — Other Ambulatory Visit: Payer: Self-pay

## 2023-09-15 DIAGNOSIS — I495 Sick sinus syndrome: Secondary | ICD-10-CM | POA: Diagnosis not present

## 2023-09-15 DIAGNOSIS — I480 Paroxysmal atrial fibrillation: Secondary | ICD-10-CM | POA: Diagnosis not present

## 2023-09-15 DIAGNOSIS — K5791 Diverticulosis of intestine, part unspecified, without perforation or abscess with bleeding: Secondary | ICD-10-CM

## 2023-09-15 DIAGNOSIS — I5032 Chronic diastolic (congestive) heart failure: Secondary | ICD-10-CM | POA: Diagnosis not present

## 2023-09-15 DIAGNOSIS — E878 Other disorders of electrolyte and fluid balance, not elsewhere classified: Secondary | ICD-10-CM | POA: Diagnosis present

## 2023-09-15 DIAGNOSIS — D6832 Hemorrhagic disorder due to extrinsic circulating anticoagulants: Secondary | ICD-10-CM | POA: Diagnosis present

## 2023-09-15 DIAGNOSIS — E871 Hypo-osmolality and hyponatremia: Secondary | ICD-10-CM | POA: Diagnosis not present

## 2023-09-15 DIAGNOSIS — Z96651 Presence of right artificial knee joint: Secondary | ICD-10-CM | POA: Diagnosis not present

## 2023-09-15 DIAGNOSIS — I2510 Atherosclerotic heart disease of native coronary artery without angina pectoris: Secondary | ICD-10-CM | POA: Diagnosis present

## 2023-09-15 DIAGNOSIS — Z7983 Long term (current) use of bisphosphonates: Secondary | ICD-10-CM

## 2023-09-15 DIAGNOSIS — I252 Old myocardial infarction: Secondary | ICD-10-CM | POA: Diagnosis not present

## 2023-09-15 DIAGNOSIS — Z950 Presence of cardiac pacemaker: Secondary | ICD-10-CM

## 2023-09-15 DIAGNOSIS — Z86011 Personal history of benign neoplasm of the brain: Secondary | ICD-10-CM

## 2023-09-15 DIAGNOSIS — Z86000 Personal history of in-situ neoplasm of breast: Secondary | ICD-10-CM

## 2023-09-15 DIAGNOSIS — K922 Gastrointestinal hemorrhage, unspecified: Principal | ICD-10-CM | POA: Diagnosis present

## 2023-09-15 DIAGNOSIS — T45515A Adverse effect of anticoagulants, initial encounter: Secondary | ICD-10-CM | POA: Diagnosis not present

## 2023-09-15 DIAGNOSIS — Z85820 Personal history of malignant melanoma of skin: Secondary | ICD-10-CM | POA: Diagnosis not present

## 2023-09-15 DIAGNOSIS — G629 Polyneuropathy, unspecified: Secondary | ICD-10-CM

## 2023-09-15 DIAGNOSIS — I272 Pulmonary hypertension, unspecified: Secondary | ICD-10-CM | POA: Diagnosis present

## 2023-09-15 DIAGNOSIS — N1832 Chronic kidney disease, stage 3b: Secondary | ICD-10-CM | POA: Diagnosis not present

## 2023-09-15 DIAGNOSIS — Z7902 Long term (current) use of antithrombotics/antiplatelets: Secondary | ICD-10-CM

## 2023-09-15 DIAGNOSIS — K5731 Diverticulosis of large intestine without perforation or abscess with bleeding: Principal | ICD-10-CM | POA: Diagnosis present

## 2023-09-15 DIAGNOSIS — Z803 Family history of malignant neoplasm of breast: Secondary | ICD-10-CM

## 2023-09-15 DIAGNOSIS — D62 Acute posthemorrhagic anemia: Principal | ICD-10-CM

## 2023-09-15 DIAGNOSIS — Z170 Estrogen receptor positive status [ER+]: Secondary | ICD-10-CM | POA: Diagnosis not present

## 2023-09-15 DIAGNOSIS — Z888 Allergy status to other drugs, medicaments and biological substances status: Secondary | ICD-10-CM

## 2023-09-15 DIAGNOSIS — E785 Hyperlipidemia, unspecified: Secondary | ICD-10-CM | POA: Diagnosis not present

## 2023-09-15 DIAGNOSIS — I130 Hypertensive heart and chronic kidney disease with heart failure and stage 1 through stage 4 chronic kidney disease, or unspecified chronic kidney disease: Secondary | ICD-10-CM | POA: Diagnosis present

## 2023-09-15 DIAGNOSIS — Z79899 Other long term (current) drug therapy: Secondary | ICD-10-CM

## 2023-09-15 DIAGNOSIS — E861 Hypovolemia: Secondary | ICD-10-CM | POA: Diagnosis present

## 2023-09-15 DIAGNOSIS — E876 Hypokalemia: Secondary | ICD-10-CM

## 2023-09-15 DIAGNOSIS — Z7901 Long term (current) use of anticoagulants: Secondary | ICD-10-CM

## 2023-09-15 DIAGNOSIS — K219 Gastro-esophageal reflux disease without esophagitis: Secondary | ICD-10-CM | POA: Diagnosis present

## 2023-09-15 DIAGNOSIS — Z91048 Other nonmedicinal substance allergy status: Secondary | ICD-10-CM

## 2023-09-15 DIAGNOSIS — Z974 Presence of external hearing-aid: Secondary | ICD-10-CM

## 2023-09-15 DIAGNOSIS — Z1721 Progesterone receptor positive status: Secondary | ICD-10-CM

## 2023-09-15 HISTORY — DX: Hyperlipidemia, unspecified: E78.5

## 2023-09-15 LAB — COMPREHENSIVE METABOLIC PANEL
ALT: 15 U/L (ref 0–44)
AST: 25 U/L (ref 15–41)
Albumin: 2.8 g/dL — ABNORMAL LOW (ref 3.5–5.0)
Alkaline Phosphatase: 20 U/L — ABNORMAL LOW (ref 38–126)
Anion gap: 11 (ref 5–15)
BUN: 36 mg/dL — ABNORMAL HIGH (ref 8–23)
CO2: 26 mmol/L (ref 22–32)
Calcium: 8.2 mg/dL — ABNORMAL LOW (ref 8.9–10.3)
Chloride: 94 mmol/L — ABNORMAL LOW (ref 98–111)
Creatinine, Ser: 1.46 mg/dL — ABNORMAL HIGH (ref 0.44–1.00)
GFR, Estimated: 33 mL/min — ABNORMAL LOW (ref >60)
Glucose, Bld: 124 mg/dL — ABNORMAL HIGH (ref 70–99)
Potassium: 3.3 mmol/L — ABNORMAL LOW (ref 3.5–5.1)
Sodium: 131 mmol/L — ABNORMAL LOW (ref 135–145)
Total Bilirubin: 0.8 mg/dL (ref 0.0–1.2)
Total Protein: 4.6 g/dL — ABNORMAL LOW (ref 6.5–8.1)

## 2023-09-15 LAB — BASIC METABOLIC PANEL
Anion gap: 12 (ref 5–15)
BUN: 31 mg/dL — ABNORMAL HIGH (ref 8–23)
CO2: 25 mmol/L (ref 22–32)
Calcium: 8.4 mg/dL — ABNORMAL LOW (ref 8.9–10.3)
Chloride: 93 mmol/L — ABNORMAL LOW (ref 98–111)
Creatinine, Ser: 1.31 mg/dL — ABNORMAL HIGH (ref 0.44–1.00)
GFR, Estimated: 38 mL/min — ABNORMAL LOW (ref >60)
Glucose, Bld: 185 mg/dL — ABNORMAL HIGH (ref 70–99)
Potassium: 3.5 mmol/L (ref 3.5–5.1)
Sodium: 130 mmol/L — ABNORMAL LOW (ref 135–145)

## 2023-09-15 LAB — CBC WITH DIFFERENTIAL/PLATELET
Abs Immature Granulocytes: 0.13 10*3/uL — ABNORMAL HIGH (ref 0.00–0.07)
Basophils Absolute: 0 10*3/uL (ref 0.0–0.1)
Basophils Relative: 0 %
Eosinophils Absolute: 0.1 10*3/uL (ref 0.0–0.5)
Eosinophils Relative: 1 %
HCT: 19.4 % — ABNORMAL LOW (ref 36.0–46.0)
Hemoglobin: 6.4 g/dL — ABNORMAL LOW (ref 12.0–15.0)
Immature Granulocytes: 2 %
Lymphocytes Relative: 16 %
Lymphs Abs: 1.4 10*3/uL (ref 0.7–4.0)
MCH: 31.5 pg (ref 26.0–34.0)
MCHC: 33 g/dL (ref 30.0–36.0)
MCV: 95.6 fL (ref 80.0–100.0)
Monocytes Absolute: 0.8 10*3/uL (ref 0.1–1.0)
Monocytes Relative: 9 %
Neutro Abs: 6.4 10*3/uL (ref 1.7–7.7)
Neutrophils Relative %: 72 %
Platelets: 166 10*3/uL (ref 150–400)
RBC: 2.03 MIL/uL — ABNORMAL LOW (ref 3.87–5.11)
RDW: 14.3 % (ref 11.5–15.5)
WBC: 8.8 10*3/uL (ref 4.0–10.5)
nRBC: 0.3 % — ABNORMAL HIGH (ref 0.0–0.2)

## 2023-09-15 LAB — HEMOGLOBIN AND HEMATOCRIT, BLOOD
HCT: 26 % — ABNORMAL LOW (ref 36.0–46.0)
HCT: 27.3 % — ABNORMAL LOW (ref 36.0–46.0)
Hemoglobin: 8.7 g/dL — ABNORMAL LOW (ref 12.0–15.0)
Hemoglobin: 9.1 g/dL — ABNORMAL LOW (ref 12.0–15.0)

## 2023-09-15 LAB — PREPARE RBC (CROSSMATCH)

## 2023-09-15 MED ORDER — TRAZODONE HCL 50 MG PO TABS: 25.0000 mg | ORAL_TABLET | Freq: Every evening | ORAL | Status: DC | PRN

## 2023-09-15 MED ORDER — SPIRONOLACTONE 25 MG PO TABS
25.0000 mg | ORAL_TABLET | Freq: Two times a day (BID) | ORAL | Status: DC
Start: 2023-09-15 — End: 2023-09-15

## 2023-09-15 MED ORDER — POTASSIUM CHLORIDE CRYS ER 10 MEQ PO TBCR
20.0000 meq | EXTENDED_RELEASE_TABLET | Freq: Two times a day (BID) | ORAL | Status: DC
  Administered 2023-09-15 (×2): 20 meq via ORAL
  Filled 2023-09-15 (×3): qty 2

## 2023-09-15 MED ORDER — ONDANSETRON HCL 4 MG/2ML IJ SOLN: 4.0000 mg | Freq: Four times a day (QID) | INTRAMUSCULAR | Status: DC | PRN

## 2023-09-15 MED ORDER — ACETAMINOPHEN 325 MG PO TABS: 650.0000 mg | ORAL_TABLET | Freq: Four times a day (QID) | ORAL | Status: DC | PRN

## 2023-09-15 MED ORDER — PREDNISONE 10 MG PO TABS
15.0000 mg | ORAL_TABLET | Freq: Every day | ORAL | Status: DC
  Administered 2023-09-15 – 2023-09-16 (×2): 15 mg via ORAL
  Filled 2023-09-15 (×2): qty 2

## 2023-09-15 MED ORDER — GABAPENTIN 300 MG PO CAPS
300.0000 mg | ORAL_CAPSULE | Freq: Three times a day (TID) | ORAL | Status: DC
  Administered 2023-09-15 – 2023-09-16 (×3): 300 mg via ORAL
  Filled 2023-09-15 (×4): qty 1

## 2023-09-15 MED ORDER — ALBUTEROL SULFATE HFA 108 (90 BASE) MCG/ACT IN AERS: 2.0000 | INHALATION_SPRAY | RESPIRATORY_TRACT | Status: DC | PRN

## 2023-09-15 MED ORDER — ROSUVASTATIN CALCIUM 20 MG PO TABS
10.0000 mg | ORAL_TABLET | Freq: Every day | ORAL | Status: DC
  Administered 2023-09-15 – 2023-09-16 (×2): 10 mg via ORAL
  Filled 2023-09-15 (×2): qty 1

## 2023-09-15 MED ORDER — NITROGLYCERIN 0.4 MG SL SUBL: 0.4000 mg | SUBLINGUAL_TABLET | SUBLINGUAL | Status: DC | PRN

## 2023-09-15 MED ORDER — SODIUM CHLORIDE 0.9 % IV SOLN: INTRAVENOUS | Status: DC

## 2023-09-15 MED ORDER — SODIUM CHLORIDE 0.9 % IV SOLN
10.0000 mL/h | Freq: Once | INTRAVENOUS | Status: AC
  Administered 2023-09-15: 10 mL/h via INTRAVENOUS

## 2023-09-15 MED ORDER — ALBUTEROL SULFATE (2.5 MG/3ML) 0.083% IN NEBU: 2.5000 mg | INHALATION_SOLUTION | RESPIRATORY_TRACT | Status: DC | PRN

## 2023-09-15 MED ORDER — ACETAMINOPHEN 650 MG RE SUPP: 650.0000 mg | Freq: Four times a day (QID) | RECTAL | Status: DC | PRN

## 2023-09-15 MED ORDER — PROSIGHT PO TABS
1.0000 | ORAL_TABLET | Freq: Two times a day (BID) | ORAL | Status: DC
  Administered 2023-09-15 – 2023-09-16 (×3): 1 via ORAL
  Filled 2023-09-15 (×3): qty 1

## 2023-09-15 MED ORDER — ONDANSETRON HCL 4 MG PO TABS: 4.0000 mg | ORAL_TABLET | Freq: Four times a day (QID) | ORAL | Status: DC | PRN

## 2023-09-15 MED ORDER — ISOSORBIDE MONONITRATE ER 60 MG PO TB24
30.0000 mg | ORAL_TABLET | Freq: Every day | ORAL | Status: DC
  Administered 2023-09-15 – 2023-09-16 (×2): 30 mg via ORAL
  Filled 2023-09-15 (×2): qty 1

## 2023-09-15 MED ORDER — SODIUM CHLORIDE 0.9 % IV SOLN: 10.0000 mL/h | Freq: Once | INTRAVENOUS | Status: DC

## 2023-09-15 NOTE — ED Notes (Signed)
 Pt cleaned up pericare applied, no incident. CB in reach.

## 2023-09-15 NOTE — ED Triage Notes (Signed)
 Patient reports bright red rectal bleeding starting yesterday that increased today. Patient reports that she has been off her blood thinners for 4 days and just took her first dose yesterday. Patient was seen here for same recently and had colonoscopy done.

## 2023-09-15 NOTE — ED Notes (Signed)
 Answered call light, pt advised she needs to use the bathroom. Assisted the pt with getting out the bed, taking the brief off and pt is on the bedside commode. Call light in reach for when pt is finished

## 2023-09-15 NOTE — Care Management Obs Status (Signed)
 MEDICARE OBSERVATION STATUS NOTIFICATION   Patient Details  Name: Charlotte Glover MRN: [id number] Date of Birth: 02-Mar-1928   Medicare Observation Status Notification Given:  Yes    Georgetta Crafton E [place], LCSW 09/15/2023, 9:48 AM

## 2023-09-15 NOTE — Assessment & Plan Note (Signed)
-   Will obtain EKG. - We will hold off Xarelto.

## 2023-09-15 NOTE — Assessment & Plan Note (Signed)
-   She will be hydrated with IV normal saline for likely hypovolemic hyponatremia

## 2023-09-15 NOTE — Assessment & Plan Note (Signed)
-   This is fairly stable. - Will be hydrated as mentioned above.

## 2023-09-15 NOTE — ED Notes (Signed)
 Assisted pt back to the bed, and hooked her back up to the monitors. Pt declines for light to be turned off at this time, however, did request I lower the head of the bed.

## 2023-09-15 NOTE — Progress Notes (Signed)
 Brief rounding note, same day as admission  HPI: Pt admitted earlier this morning with recurrent rectal bleeding that started within hours of resuming her Xarelto yesterday.  Recently discharged 3/19 after admission for rectal bleeding that was felt to be diverticular.  See H&P for full HPI on admission & ED course.   Interval history: Pt seen in ED holding for a bed, son and husband at bedside. We discussed last admission, GI evaluation and their recommendations to obtain a "bleed scan" if bleeding recurred.  Pt is eager to determine source of her bleeding.  Exam: General exam: awake, alert, no acute distress Respiratory system: CTAB, no wheezes, rales or rhonchi, normal respiratory effort. Cardiovascular system: normal S1/S2, RRR, no pedal edema.   Gastrointestinal system: soft, NT, ND, no HSM felt, +bowel sounds. Central nervous system: A&O x 3. no gross focal neurologic deficits, normal speech Extremities: moves all, no edema, normal tone Skin: dry, intact, normal temperature Psychiatry: normal mood, congruent affect, judgement and insight appear normal    A&P: as per H&P by Dr. Arville Care, with any changes or additions as below:  --Resume home prednisone 15 mg daily for GCA --Follow post-transfusion Hbg levels --Transfuse for Hbg < 8 --Cancel GI consult after case reviewed and no endoscopic evaluation is indicated --Obtain STAT CTA abdomen/pelvis or NM bleed scan if signs of active bleeding -- tachycardia, hypotension, significant rectal bleeding     No charge

## 2023-09-15 NOTE — ED Notes (Signed)
 Pt given some H2O per order as she was thirsty.

## 2023-09-15 NOTE — Assessment & Plan Note (Signed)
 -  We will continue Neurontin. ?

## 2023-09-15 NOTE — Assessment & Plan Note (Addendum)
-   This is recurrent lower GI bleeding likely secondary to diverticulosis. - The patient will be admitted to a progressive unit bed. - She was typed and crossmatch will be transfused 2 units of packed red blood cells. - We will hold off Xarelto as well as diuretic therapy. - We will follow posttransfusion H&H. - GI consult  will be obtained. - Dr. Allegra Lai was notified about the patient.

## 2023-09-15 NOTE — Care Management CC44 (Signed)
 Condition Code 44 Documentation Completed  Patient Details  Name: Charlotte Glover MRN: [id number] Date of Birth: 05/26/1928   Condition Code 44 given:  Yes Patient signature on Condition Code 44 notice:  Yes Documentation of 2 MD's agreement:  Yes Code 44 added to claim:  Yes    Verlan Grotz E [place], LCSW 09/15/2023, 9:49 AM

## 2023-09-15 NOTE — Assessment & Plan Note (Signed)
-   This is clearly secondary to #1. - She was typed and crossmatch and will be transfused 2 units of packed red blood cells. - We will follow posttransfusion H&H as mentioned above.

## 2023-09-15 NOTE — ED Notes (Signed)
 Pt cleaned up as there was a smell the resembled a BM. No BM noted, small amount of blood in peri-anal area. Pt cleaned, brief changed, no incident. CB in reach.

## 2023-09-15 NOTE — ED Notes (Signed)
 H/H to be taken after second unit of blood per EDP

## 2023-09-15 NOTE — ED Provider Notes (Signed)
 [facility] Provider Note    Event Date/Time   First MD Initiated Contact with Patient 09/15/23 [phone]     (approximate)   History   Rectal Bleeding   HPI  Charlotte Glover is a 89 year old female with history of A-fib on Xarelto, heart block s/p pacemaker, HTN, CAD presenting to the emergency department for evaluation of rectal bleeding.  Patient recently admitted for similar.  Has been holding her Xarelto for the past few days, took first dose on Saturday morning.  Around 2 PM on Saturday she had onset of bright red blood per rectum.  No bloody stools.  No associated abdominal pain.  Has progressively worsened to the point of near continuous bleeding.  I reviewed her discharge summary from 09/11/2023.  At that time, patient presented with multiple episodes of rectal bleeding.  Colonoscopy demonstrated diverticulosis with blood in the colon and rectosigmoid area.       Physical Exam   Triage Vital Signs: ED Triage Vitals  Encounter Vitals Group     BP 09/15/23 0139 (!) 150/59     Systolic BP Percentile --      Diastolic BP Percentile --      Pulse Rate 09/15/23 0139 71     Resp 09/15/23 0139 16     Temp 09/15/23 0139 98.3 F (36.8 C)     Temp Source 09/15/23 0139 Oral     SpO2 09/15/23 0139 100 %     Weight --      Height --      Head Circumference --      Peak Flow --      Pain Score 09/15/23 0137 0     Pain Loc --      Pain Education --      Exclude from Growth Chart --     Most recent vital signs: Vitals:   09/15/23 0400 09/15/23 0415  BP: (!) 163/62 (!) 146/50  Pulse: 70 70  Resp: 15 16  Temp:  98 F (36.7 C)  SpO2: 100%      General: Awake, interactive  CV:  Regular rate, good peripheral perfusion.  Resp:  Unlabored respirations.  Lungs clear to auscultation Abd:  Nondistended, soft, nontender to palpation Neuro:  Symmetric facial movement, fluid speech   ED Results / Procedures / Treatments   Labs (all labs ordered  are listed, but only abnormal results are displayed) Labs Reviewed  CBC WITH DIFFERENTIAL/PLATELET - Abnormal; Notable for the following components:      Result Value   RBC 2.03 (*)    Hemoglobin 6.4 (*)    HCT 19.4 (*)    nRBC 0.3 (*)    Abs Immature Granulocytes 0.13 (*)    All other components within normal limits  COMPREHENSIVE METABOLIC PANEL - Abnormal; Notable for the following components:   Sodium 131 (*)    Potassium 3.3 (*)    Chloride 94 (*)    Glucose, Bld 124 (*)    BUN 36 (*)    Creatinine, Ser 1.46 (*)    Calcium 8.2 (*)    Total Protein 4.6 (*)    Albumin 2.8 (*)    Alkaline Phosphatase 20 (*)    GFR, Estimated 33 (*)    All other components within normal limits  TYPE AND SCREEN  PREPARE RBC (CROSSMATCH)     EKG EKG independently reviewed interpreted by myself (ER attending) demonstrates:    RADIOLOGY Imaging independently reviewed and interpreted by myself demonstrates:  PROCEDURES:  Critical Care performed: Yes, see critical care procedure note(s)  CRITICAL CARE Performed by: Trinna Post   Total critical care time: 31 minutes  Critical care time was exclusive of separately billable procedures and treating other patients.  Critical care was necessary to treat or prevent imminent or life-threatening deterioration.  Critical care was time spent personally by me on the following activities: development of treatment plan with patient and/or surrogate as well as nursing, discussions with consultants, evaluation of patient's response to treatment, examination of patient, obtaining history from patient or surrogate, ordering and performing treatments and interventions, ordering and review of laboratory studies, ordering and review of radiographic studies, pulse oximetry and re-evaluation of patient's condition.   Procedures   MEDICATIONS ORDERED IN ED: Medications  0.9 %  sodium chloride infusion (0 mL/hr Intravenous Hold 09/15/23 0351)      IMPRESSION / MDM / ASSESSMENT AND PLAN / ED COURSE  I reviewed the triage vital signs and the nursing notes.  Differential diagnosis includes, but is not limited to, recurrent bleeding possibly from diverticula, anorectal bleed, other GI bleed  Patient's presentation is most consistent with acute presentation with potential threat to life or bodily function.  89 year old female presenting with recurrent rectal bleeding, recently admitted for similar.  Restarted anticoagulation today.  Blood work demonstrates hemoglobin dropped to 6.4 down from 10 at time of discharge.  Ordered for 1 unit of PRBCs.  Will reach out to hospitalist team to discuss admission.  Case reviewed with Dr. Arville Care.  He did request that a second unit of blood be transfused.  Hospitalist team will evaluate for anticipated admission.      FINAL CLINICAL IMPRESSION(S) / ED DIAGNOSES   Final diagnoses:  Acute GI bleeding     Rx / DC Orders   ED Discharge Orders     None        Note:  This document was prepared using Dragon voice recognition software and may include unintentional dictation errors.   Trinna Post, MD 09/15/23 [phone]

## 2023-09-15 NOTE — H&P (Signed)
 [place]   PATIENT NAME: Charlotte Glover    MR#:  [id number]  DATE OF BIRTH:  28-Aug-1927  DATE OF ADMISSION:  09/15/2023  PRIMARY CARE PHYSICIAN: Lynnea Ferrier, MD   Patient is coming from: Home  REQUESTING/REFERRING PHYSICIAN: Trinna Post, MD  CHIEF COMPLAINT:   Chief Complaint  Patient presents with   Rectal Bleeding    HISTORY OF PRESENT ILLNESS:  Epifania Littrell is a 89 y.o. female with medical history significant for HFpEF, atrial fibrillation on anticoagulation with Xarelto, s/p PPM, GERD, hypertension, dyslipidemia, and pulmonary hypertension, who presented to the ER with acute onset of bright red bleeding per rectum.  The patient was recently admitted for the same reason.  She has been holding her Xarelto for the past few days and took the first dose on Saturday morning.  Around 2 PM on Saturday she had acute onset of bright red bleeding per rectum.  She denied any melena or bloody stools.  She denied any associated abdominal pain.  No nausea or vomiting or hematemesis.  No dysuria, oliguria or hematuria or flank pain.  No other bleeding diathesis.  She denied any cough or wheezing or dyspnea.  No chest pain or palpitations.  Rectal bleeding has been progressively worsening and therefore she presented to the ER.  She had a recent colonoscopy demonstrating diverticulosis with blood in the colon and rectosigmoid area.  ED Course: When she came to the ER, BP was 130/59 with otherwise normal vital signs.  Labs revealed hyponatremia 131 with hypokalemia of 3.3 and hypochloremia of 94 with a BUN of 36 and creatinine 1.46 comparable to previous levels and calcium 8.2.  At Phos was 20 and albumin 2.8 with total protein of 4.6.  CBC showed anemia with hemoglobin 6.4 hematocrit 19.4.  Previous hemoglobin was 10 on 3/19.  Blood group was A+ with negative antibody screen.   EKG as reviewed by me : None Imaging: None.  The patient was started as manage will be transfused 2  units of packed red blood cells.  She will be admitted to an observation progressive unit bed for further evaluation and management. PAST MEDICAL HISTORY:   Past Medical History:  Diagnosis Date   (HFpEF) heart failure with preserved ejection fraction (HCC) 06/21/2020   a.) TTE 06/21/2020: EF >55%, RAE, G1DD   Amaurosis fugax of left eye    Arthritis    Atrial fibrillation (HCC)    a.) Dx'd in 2009; b.) CHA2DS2-VASc = 6 (age x 2, sex, CHF, HTN, prior MI); c.) s/p PVI 09/31/2013; DCCV (200 J x 1) 04/02/2012, repeat PVI with roof line, coronary sinus isolation, and CFAE ablation 01/02/2013; DCCV 01/18/2013 (200 J x 1); AV nodal ablation 05/28/2014;  d.) rate/rhythm maintained without pharmacological intervention; chronically anticoagulated using rivaroxaban   Cardiac arrest (HCC) 10/2009   a.) witnessed arrest at home; husband performed CPR; ROSC achieved in the field. Admitted for workup --> etiology of CV event undetermined; felt to be secondary to "electrolyte abnormalities".   Cardiac murmur    CHB (complete heart block) (HCC)    a.) s/p PPM 05/28/2014; b.) CRT-P upgrade 07/13/2016.   Chickenpox    CKD (chronic kidney disease), stage III (HCC)    Coronary artery disease    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/13/2013   a.) grade II; ER/PR (+)   Dyslipidemia 09/15/2023   Dyspnea    Edema    GERD (gastroesophageal reflux disease)  Granulomatous disease (HCC)    a.) spleen   Hiatal hernia    HLD (hyperlipidemia)    HOH (hard of hearing)    Hypertension 01/23/2012   Jackhammer esophagus    Long term current use of antithrombotics/antiplatelets    a.) rivaroxaban   Melanoma of lower leg, left (HCC)    Meningioma, multiple (HCC) 07/14/2019   a.) MRI brain 07/14/2019: 1.2 x 1.3 x 1.3 cm midline posterior fossa meningioma inferior to the cerebellar vertex; 7 x 3 mm dural based meningioma along frontal convexity   NSTEMI (non-ST elevated myocardial infarction) (HCC)  08/2009   Presence of permanent cardiac pacemaker    Pulmonary HTN (HCC) 05/08/2016   a.) R/LHC 05/08/2016: EF >55, LVEDP 15, mean PA 38, AO sat 97, RVSP 50, CO 3 L/min   S/P ablation of atrial fibrillation    Shingles    SSS (sick sinus syndrome) (HCC)    a.) CRT-P in place   Valvular heart disease 02/08/2012   a.) cMRI 02/08/2012: EF 50-55, mild-mod AR, mod MR; b.) TEE 02/26/2012: EF >55, mild MR/AR/TR; c.) R/LHC 05/08/2016: sev MR; d.) TEE 06/14/2016: EF >55, sev MR, mod AR/TR; e.) TTE 10/05/2016: EF >55, triv PR, mod AR/MR/TR; f.) TTE 04/22/2018: EF >55, mild MR/TR/PR, mod AR; g.) TTE 11/27/2019: EF 50, mild PR, mod AR/MR/TR; h.) TTE 06/21/2020: EF >55, triv PR, mod AR/MR/TR   Wears hearing aid in both ears     PAST SURGICAL HISTORY:   Past Surgical History:  Procedure Laterality Date   ANTERIOR VITRECTOMY Left 03/20/2017   Procedure: ANTERIOR VITRECTOMY;  Surgeon: Lockie Mola, MD;  Location: [facility];  Service: Ophthalmology;  Laterality: Left;  IVA TOPICAL LEFT   ARTERY BIOPSY Right 04/08/2019   Procedure: BIOPSY TEMPORAL ARTERY;  Surgeon: Annice Needy, MD;  Location: [facility];  Service: Vascular;  Laterality: Right;   ATRIAL FIBRILLATION ABLATION N/A 03/24/2012   Procedure: ATRIAL FIBRILLATION ABLATION (PVI); Location: [facility]   ATRIAL FIBRILLATION ABLATION N/A 01/02/2013   Procedure: ATRIAL FIBRILLATION ABLATION (PVI, roof line, coronary sinus siolation, CFAE ablation); Location: [facility]   AV NODE ABLATION N/A 05/26/2014   Procedure: AV NODE ABLATION; Location: [facility]   BIV PACEMAKER INSERTION CRT-P N/A 07/13/2016   Procedure: CRT-P BiV PACEMAKER UPGRADE; Location: [facility]   BREAST BIOPSY Right 2015   + for DCIS    BREAST SURGERY     CARDIAC CATHETERIZATION Bilateral 05/08/2016   Procedure: Right/Left Heart Cath and Coronary Angiography;  Surgeon: Marcina Millard, MD;  Location: [facility];  Service: Cardiovascular;  Laterality: Bilateral;    CARDIOVERSION N/A 04/02/2012   Procedure: CARDIOVERSION; Location: [facility]   CARDIOVERSION N/A 01/15/2013   Procedure: CARDIOVERSION; Location: [facility]   CATARACT EXTRACTION W/PHACO Left 03/20/2017   Procedure: IOL EXCHANGE;  Surgeon: Lockie Mola, MD;  Location: [facility];  Service: Ophthalmology;  Laterality: Left;   CHOLECYSTECTOMY     COLONOSCOPY     patient reports several   COLONOSCOPY N/A 09/11/2023   Procedure: COLONOSCOPY;  Surgeon: Midge Minium, MD;  Location: [facility];  Service: Endoscopy;  Laterality: N/A;   DILATION AND CURETTAGE OF UTERUS     ESOPHAGOGASTRODUODENOSCOPY     ESOPHAGOGASTRODUODENOSCOPY (EGD) WITH PROPOFOL N/A 12/13/2020   Procedure: ESOPHAGOGASTRODUODENOSCOPY (EGD) WITH PROPOFOL;  Surgeon: Regis Bill, MD;  Location: [facility];  Service: Endoscopy;  Laterality: N/A;   KNEE ARTHROPLASTY Right 12/25/2021   Procedure: COMPUTER ASSISTED TOTAL KNEE ARTHROPLASTY;  Surgeon: Donato Heinz, MD;  Location: [facility];  Service: Orthopedics;  Laterality: Right;   PACEMAKER INSERTION N/A 05/28/2014   Procedure: PACEMAKER INSERTION (dual chamber); Location: [facility]   PERIPHERAL IRIDOTOMY Left 03/20/2017   Procedure: PERIPHERAL IRIDECTOMY;  Surgeon: Lockie Mola, MD;  Location: [facility];  Service: Ophthalmology;  Laterality: Left;   ROTATOR CUFF REPAIR Left    TEE WITHOUT CARDIOVERSION N/A 05/16/2016   Procedure: TRANSESOPHAGEAL ECHOCARDIOGRAM (TEE);  Surgeon: Dalia Heading, MD;  Location: [facility];  Service: Cardiovascular;  Laterality: N/A;   TONSILLECTOMY     uterine polyp      SOCIAL HISTORY:   Social History   Tobacco Use   Smoking status: Never   Smokeless tobacco: Never  Substance Use Topics   Alcohol use: Yes    Alcohol/week: 7.0 standard drinks of alcohol    Types: 7 Shots of liquor per week    Comment:  (1 scotch daily)    FAMILY HISTORY:   Family History  Problem Relation Age of Onset   Breast cancer  Mother    Cancer Sister    Cancer Brother    Cancer Daughter    Breast cancer Daughter    Cancer Son     DRUG ALLERGIES:   Allergies  Allergen Reactions   Amiodarone Other (See Comments)    Bradycardia and sinus pauses   Baclofen Other (See Comments)    Double vision  Vision disturbances  Double vision    Vision disturbances   Metoprolol Rash   Pantoprazole Rash   Silicone Rash    Tape causes a skin rash.  ECG electrodes cause skin irritation when left on for prolonged period of time.   Tape Rash    REVIEW OF SYSTEMS:   ROS As per history of present illness. All pertinent systems were reviewed above. Constitutional, HEENT, cardiovascular, respiratory, GI, GU, musculoskeletal, neuro, psychiatric, endocrine, integumentary and hematologic systems were reviewed and are otherwise negative/unremarkable except for positive findings mentioned above in the HPI.   MEDICATIONS AT HOME:   Prior to Admission medications   Medication Sig Start Date End Date Taking? Authorizing Provider  acetaminophen (TYLENOL) 500 MG tablet Take 1,000 mg by mouth every 6 (six) hours as needed for pain or fever.    [provider]  albuterol (VENTOLIN HFA) 108 (90 Base) MCG/ACT inhaler Inhale 2 puffs into the lungs every 4 (four) hours as needed. 07/02/23   Becky Augusta, NP  alendronate (FOSAMAX) 70 MG tablet Take 70 mg by mouth once a week. 05/14/23 05/13/24  [provider]  esomeprazole (NEXIUM) 40 MG capsule Take 40 mg by mouth daily.    [provider]  furosemide (LASIX) 40 MG tablet Take 40 mg by mouth 2 (two) times daily.  03/30/16   [provider]  gabapentin (NEURONTIN) 300 MG capsule Take 300 mg by mouth 3 (three) times daily.    [provider]  ipratropium (ATROVENT) 0.03 % nasal spray Place into the nose. 07/02/23 07/16/23  [provider]  isosorbide mononitrate (IMDUR) 30 MG 24 hr tablet Take 30 mg by mouth daily. 04/24/23 04/23/24   [provider]  KLOR-CON M10 10 MEQ tablet Take 2 tablets by mouth 2 (two) times daily. 01/14/23   [provider]  metolazone (ZAROXOLYN) 5 MG tablet Take 5 mg by mouth daily. Takes on Friday    [provider]  Multiple Vitamins-Minerals (PRESERVISION AREDS 2) CAPS Take 1 capsule by mouth 2 (two) times daily.    [provider]  nitroGLYCERIN (NITROSTAT) 0.4 MG  SL tablet Place 0.4 mg under the tongue every 5 (five) minutes x 2 doses as needed for chest pain.  04/01/13   [provider]  Polyvinyl Alcohol-Povidone (REFRESH OP) Apply 1 drop to eye daily as needed (dry eyes).    [provider]  predniSONE (DELTASONE) 10 MG tablet Take 10 mg by mouth daily. 01/28/23   [provider]  rivaroxaban (XARELTO) 20 MG TABS tablet Take 20 mg by mouth daily after breakfast.    [provider]  rosuvastatin (CRESTOR) 10 MG tablet Take 10 mg by mouth daily.    [provider]  senna (SENOKOT) 8.6 MG TABS tablet Take 1 tablet by mouth daily as needed for mild constipation.    [provider]  Spacer/Aero-Holding Chambers (AEROCHAMBER MV) inhaler Use as instructed 07/02/23   Becky Augusta, NP  spironolactone (ALDACTONE) 25 MG tablet Take 25 mg by mouth 2 (two) times daily.    [provider]  triamcinolone ointment (KENALOG) 0.1 % Apply 1 application  topically as needed.    [provider]      VITAL SIGNS:  Blood pressure (!) 121/55, pulse 70, temperature 97.7 F (36.5 C), temperature source Oral, resp. rate 18, SpO2 100%.  PHYSICAL EXAMINATION:  Physical Exam  GENERAL:  89 y.o.-year-old patient lying in the bed with no acute distress.  EYES: Pupils equal, round, reactive to light and accommodation. No scleral icterus.  Positive pallor.  Extraocular muscles intact.  HEENT: Head atraumatic, normocephalic. Oropharynx and nasopharynx clear.  NECK:  Supple, no jugular venous distention. No thyroid  enlargement, no tenderness.  LUNGS: Normal breath sounds bilaterally, no wheezing, rales,rhonchi or crepitation. No use of accessory muscles of respiration.  CARDIOVASCULAR: Regular rate and rhythm, S1, S2 normal. No murmurs, rubs, or gallops.  ABDOMEN: Soft, nondistended, nontender. Bowel sounds present. No organomegaly or mass.  EXTREMITIES: No pedal edema, cyanosis, or clubbing.  NEUROLOGIC: Cranial nerves II through XII are intact. Muscle strength 5/5 in all extremities. Sensation intact. Gait not checked.  PSYCHIATRIC: The patient is alert and oriented x 3.  Normal affect and good eye contact. SKIN: No obvious rash, lesion, or ulcer.   LABORATORY PANEL:   CBC Recent Labs  Lab 09/15/23 0204  WBC 8.8  HGB 6.4*  HCT 19.4*  PLT 166   ------------------------------------------------------------------------------------------------------------------  Chemistries  Recent Labs  Lab 09/15/23 0204  NA 131*  K 3.3*  CL 94*  CO2 26  GLUCOSE 124*  BUN 36*  CREATININE 1.46*  CALCIUM 8.2*  AST 25  ALT 15  ALKPHOS 20*  BILITOT 0.8   ------------------------------------------------------------------------------------------------------------------  Cardiac Enzymes No results for input(s): "TROPONINI" in the last 168 hours. ------------------------------------------------------------------------------------------------------------------  RADIOLOGY:  No results found.    IMPRESSION AND PLAN:  Assessment and Plan: * GI bleeding - This is recurrent lower GI bleeding likely secondary to diverticulosis. - The patient will be admitted to a progressive unit bed. - She was typed and crossmatch will be transfused 2 units of packed red blood cells. - We will hold off Xarelto as well as diuretic therapy. - We will follow posttransfusion H&H. - GI consult  will be obtained. - Dr. Allegra Lai was notified about the patient.  ABLA (acute blood loss anemia) - This is clearly secondary to  #1. - She was typed and crossmatch and will be transfused 2 units of packed red blood cells. - We will follow posttransfusion H&H as mentioned above.  Hypokalemia - Potassium will be replaced and magnesium level will  be checked.  Hyponatremia - She will be hydrated with IV normal saline for likely hypovolemic hyponatremia  Stage 3b chronic kidney disease (CKD) (HCC) - This is fairly stable. - Will be hydrated as mentioned above.  Dyslipidemia - We will continue statin therapy.  Peripheral neuropathy - We will continue Neurontin.  Paroxysmal atrial fibrillation (HCC) - Will obtain EKG. - We will hold off Xarelto.       DVT prophylaxis: SCD's. Advanced Care Planning:  Code Status: full code.  Family Communication:  The plan of care was discussed in details with the patient (and family). I answered all questions. The patient agreed to proceed with the above mentioned plan. Further management will depend upon hospital course. Disposition Plan: Back to previous home environment Consults called: GI  All the records are reviewed and case discussed with ED provider.  Status is: Observation   I certify that at the time of admission, it is my clinical judgment that the patient will require hospital care extending less than 2 midnights.                            Dispo: The patient is from: Home              Anticipated d/c is to: Home              Patient currently is not medically stable to d/c.              Difficult to place patient: No  Hannah Beat M.D on 09/15/2023 at 8:12 AM  [facility]   From 7 PM-7 AM, contact night-coverage [email or web address]  CC: Primary care physician; Lynnea Ferrier, MD

## 2023-09-15 NOTE — Care Management Obs Status (Deleted)
 MEDICARE OBSERVATION STATUS NOTIFICATION   Patient Details  Name: Charlotte Glover MRN: [id number] Date of Birth: 23-Jun-1928   Medicare Observation Status Notification Given:  Yes    Hason Ofarrell E Laporshia Hogen, LCSW 09/15/2023, 9:46 AM

## 2023-09-15 NOTE — Assessment & Plan Note (Signed)
 -  We will continue statin therapy.

## 2023-09-15 NOTE — ED Notes (Signed)
 Patient turned and repositioned at this time. Pillow placed for comfort.

## 2023-09-15 NOTE — Progress Notes (Signed)
 Brief Hospitalist Note  Reviewed recent discharge summary and GI consult notes from recent admission, discharged 3/19.  Colonoscopy showed findings consistent with diverticular bleeding.  Current bleeding likely due to resuming Xarelto yesterday.  Note GI's recommendation 3/19 -- if further active bleeding, will obtain stat CTA or NM bleed scan and contact IR or vascular surgery for consideration of embolization.  I've requested bedside RN to notify me ASAP if active bleeding.

## 2023-09-15 NOTE — ED Notes (Signed)
 Patient cleaned up, new brief placed, warm blankets provided. Small amount of dark red blood noted in the patients old depends.

## 2023-09-15 NOTE — Assessment & Plan Note (Signed)
-   Potassium will be replaced and magnesium level will be checked. ?

## 2023-09-16 DIAGNOSIS — N1832 Chronic kidney disease, stage 3b: Secondary | ICD-10-CM

## 2023-09-16 DIAGNOSIS — E785 Hyperlipidemia, unspecified: Secondary | ICD-10-CM | POA: Diagnosis not present

## 2023-09-16 DIAGNOSIS — Z96651 Presence of right artificial knee joint: Secondary | ICD-10-CM | POA: Diagnosis not present

## 2023-09-16 DIAGNOSIS — K922 Gastrointestinal hemorrhage, unspecified: Secondary | ICD-10-CM | POA: Diagnosis present

## 2023-09-16 DIAGNOSIS — Z170 Estrogen receptor positive status [ER+]: Secondary | ICD-10-CM | POA: Diagnosis not present

## 2023-09-16 DIAGNOSIS — I130 Hypertensive heart and chronic kidney disease with heart failure and stage 1 through stage 4 chronic kidney disease, or unspecified chronic kidney disease: Secondary | ICD-10-CM | POA: Diagnosis not present

## 2023-09-16 DIAGNOSIS — Z7901 Long term (current) use of anticoagulants: Secondary | ICD-10-CM | POA: Diagnosis not present

## 2023-09-16 DIAGNOSIS — E878 Other disorders of electrolyte and fluid balance, not elsewhere classified: Secondary | ICD-10-CM | POA: Diagnosis not present

## 2023-09-16 DIAGNOSIS — Z7902 Long term (current) use of antithrombotics/antiplatelets: Secondary | ICD-10-CM | POA: Diagnosis not present

## 2023-09-16 DIAGNOSIS — Z85820 Personal history of malignant melanoma of skin: Secondary | ICD-10-CM | POA: Diagnosis not present

## 2023-09-16 DIAGNOSIS — D6832 Hemorrhagic disorder due to extrinsic circulating anticoagulants: Secondary | ICD-10-CM | POA: Diagnosis not present

## 2023-09-16 DIAGNOSIS — I2510 Atherosclerotic heart disease of native coronary artery without angina pectoris: Secondary | ICD-10-CM | POA: Diagnosis not present

## 2023-09-16 DIAGNOSIS — I495 Sick sinus syndrome: Secondary | ICD-10-CM | POA: Diagnosis not present

## 2023-09-16 DIAGNOSIS — K5731 Diverticulosis of large intestine without perforation or abscess with bleeding: Secondary | ICD-10-CM | POA: Diagnosis present

## 2023-09-16 DIAGNOSIS — E861 Hypovolemia: Secondary | ICD-10-CM | POA: Diagnosis not present

## 2023-09-16 DIAGNOSIS — G629 Polyneuropathy, unspecified: Secondary | ICD-10-CM | POA: Diagnosis not present

## 2023-09-16 DIAGNOSIS — D62 Acute posthemorrhagic anemia: Secondary | ICD-10-CM | POA: Diagnosis not present

## 2023-09-16 DIAGNOSIS — I272 Pulmonary hypertension, unspecified: Secondary | ICD-10-CM | POA: Diagnosis not present

## 2023-09-16 DIAGNOSIS — I5032 Chronic diastolic (congestive) heart failure: Secondary | ICD-10-CM | POA: Diagnosis present

## 2023-09-16 DIAGNOSIS — E876 Hypokalemia: Secondary | ICD-10-CM | POA: Diagnosis present

## 2023-09-16 DIAGNOSIS — I480 Paroxysmal atrial fibrillation: Secondary | ICD-10-CM

## 2023-09-16 DIAGNOSIS — E871 Hypo-osmolality and hyponatremia: Secondary | ICD-10-CM | POA: Diagnosis not present

## 2023-09-16 DIAGNOSIS — T45515A Adverse effect of anticoagulants, initial encounter: Secondary | ICD-10-CM | POA: Diagnosis not present

## 2023-09-16 DIAGNOSIS — I252 Old myocardial infarction: Secondary | ICD-10-CM | POA: Diagnosis not present

## 2023-09-16 DIAGNOSIS — Z7983 Long term (current) use of bisphosphonates: Secondary | ICD-10-CM | POA: Diagnosis not present

## 2023-09-16 LAB — BASIC METABOLIC PANEL
Anion gap: 8 (ref 5–15)
BUN: 26 mg/dL — ABNORMAL HIGH (ref 8–23)
CO2: 27 mmol/L (ref 22–32)
Calcium: 8.5 mg/dL — ABNORMAL LOW (ref 8.9–10.3)
Chloride: 98 mmol/L (ref 98–111)
Creatinine, Ser: 1.02 mg/dL — ABNORMAL HIGH (ref 0.44–1.00)
GFR, Estimated: 51 mL/min — ABNORMAL LOW (ref >60)
Glucose, Bld: 104 mg/dL — ABNORMAL HIGH (ref 70–99)
Potassium: 3.4 mmol/L — ABNORMAL LOW (ref 3.5–5.1)
Sodium: 133 mmol/L — ABNORMAL LOW (ref 135–145)

## 2023-09-16 LAB — TYPE AND SCREEN
ABO/RH(D): A POS
Antibody Screen: NEGATIVE
Unit division: 0
Unit division: 0

## 2023-09-16 LAB — BPAM RBC
Blood Product Expiration Date: 202504222359
Blood Product Expiration Date: 202504222359
ISSUE DATE / TIME: 202503230346
ISSUE DATE / TIME: 202503230641
Unit Type and Rh: 6200
Unit Type and Rh: 6200

## 2023-09-16 LAB — HEMOGLOBIN AND HEMATOCRIT, BLOOD
HCT: 27.8 % — ABNORMAL LOW (ref 36.0–46.0)
Hemoglobin: 9.1 g/dL — ABNORMAL LOW (ref 12.0–15.0)

## 2023-09-16 LAB — CBC
HCT: 25.3 % — ABNORMAL LOW (ref 36.0–46.0)
Hemoglobin: 8.5 g/dL — ABNORMAL LOW (ref 12.0–15.0)
MCH: 29.7 pg (ref 26.0–34.0)
MCHC: 33.6 g/dL (ref 30.0–36.0)
MCV: 88.5 fL (ref 80.0–100.0)
Platelets: 138 10*3/uL — ABNORMAL LOW (ref 150–400)
RBC: 2.86 MIL/uL — ABNORMAL LOW (ref 3.87–5.11)
RDW: 15.9 % — ABNORMAL HIGH (ref 11.5–15.5)
WBC: 6.9 10*3/uL (ref 4.0–10.5)
nRBC: 0.3 % — ABNORMAL HIGH (ref 0.0–0.2)

## 2023-09-16 MED ORDER — POTASSIUM CHLORIDE CRYS ER 20 MEQ PO TBCR
40.0000 meq | EXTENDED_RELEASE_TABLET | Freq: Two times a day (BID) | ORAL | Status: DC
  Administered 2023-09-16: 40 meq via ORAL
  Filled 2023-09-16: qty 2

## 2023-09-16 NOTE — Discharge Summary (Signed)
 Physician Discharge Summary   Patient: Charlotte Glover MRN: [id number] DOB: 1928/03/01  Admit date:     09/15/2023  Discharge date: 09/16/2023  Discharge Physician: Pennie Banter   PCP: Lynnea Ferrier, MD   Recommendations at discharge:    Follow up with Primary Care in 1 week Repeat CBC, CMP in 1 week - follow anemia closely Follow up with Cardiology as scheduled  Discharge Diagnoses: Principal Problem:   GI bleeding Active Problems:   Hypokalemia   ABLA (acute blood loss anemia)   Hyponatremia   Paroxysmal atrial fibrillation (HCC)   Peripheral neuropathy   Dyslipidemia   Stage 3b chronic kidney disease (CKD) (HCC)   Lower GI bleed   Lower GI bleeding  Resolved Problems:   * No resolved hospital problems. *  Hospital Course: HPI on admission:  "Charlotte Glover is a 89 y.o. female with medical history significant for HFpEF, atrial fibrillation on anticoagulation with Xarelto, s/p PPM, GERD, hypertension, dyslipidemia, and pulmonary hypertension, who presented to the ER with acute onset of bright red bleeding per rectum.  The patient was recently admitted for the same reason.  She has been holding her Xarelto for the past few days and took the first dose on Saturday morning.  Around 2 PM on Saturday she had acute onset of bright red bleeding per rectum.  She denied any melena or bloody stools.  She denied any associated abdominal pain.  No nausea or vomiting or hematemesis.  No dysuria, oliguria or hematuria or flank pain.  No other bleeding diathesis.  She denied any cough or wheezing or dyspnea.  No chest pain or palpitations.  Rectal bleeding has been progressively worsening and therefore she presented to the ER.  She had a recent colonoscopy demonstrating diverticulosis with blood in the colon and rectosigmoid area.   ED Course: When she came to the ER, BP was 130/59 with otherwise normal vital signs.  Labs revealed hyponatremia 131 with hypokalemia of 3.3 and  hypochloremia of 94 with a BUN of 36 and creatinine 1.46 comparable to previous levels and calcium 8.2.  At Phos was 20 and albumin 2.8 with total protein of 4.6.  CBC showed anemia with hemoglobin 6.4 hematocrit 19.4.  Previous hemoglobin was 10 on 3/19.  Blood group was A+ with negative antibody screen.   EKG as reviewed by me : None Imaging: None.   The patient was started as manage will be transfused 2 units of packed red blood cells.  She will be admitted to an observation progressive unit bed for further evaluation and management."   Patient was seen and evaluated by GI with no indication for repeat endoscopy, given recent studies during prior admission and presentation consistent with recurrent diverticular bleeding in the setting of resuming anticoagulation.  Patient was transfused 2 units packed RBC's with Hbg improved from nadir of 6.4 >> 8.7 >> 98.1 >> 8.5 >> 9.1 this afternoon.    With Xarelto on hold, bleeding has stopped and anemia is improving.  3/24 Patient is medically stable for discharge home today. Pt and family in agreement.    Assessment and Plan:  * Lower GI bleeding due to Recurrent diverticular bleeding. --Seen by GI - no further workup indicated --Hold Xarelto and would likely avoid anticoagulation given recurrent bleeding.  Could consider lower dose anticoagulant at follow up.  Continue to discuss risks vs benefits of anticoagulation  ABLA (acute blood loss anemia) Transfused 2 units pRBC's and Hbg improved 6.4 >> .Marland KitchenMarland Kitchen  9.1 this afternoon --PCP follow up for CBC in 1 week --Hold Xarelto at d/c  Hypokalemia K was replaced --BMP at follow up  Hyponatremia --Treated with IV fluids --BMP at follow up  Stage 3b CKD - stable  Dyslipidemia --Continue statin   Peripheral neuropathy --Continue Neurontin  Paroxysmal atrial fibrillation (HCC) --HOLD XARELTO with recurrent diverticular bleeding       Consultants: GI Procedures performed: None   Disposition: Home health Diet recommendation:  Discharge Diet Orders (From admission, onward)     Start     Ordered   09/16/23 0000  Diet - low sodium heart healthy        09/16/23 1439            DISCHARGE MEDICATION: Allergies as of 09/16/2023       Reactions   Amiodarone Other (See Comments)   Bradycardia and sinus pauses   Baclofen Other (See Comments)   Double vision Vision disturbances Double vision    Vision disturbances   Metoprolol Rash   Pantoprazole Rash   Silicone Rash   Tape causes a skin rash.  ECG electrodes cause skin irritation when left on for prolonged period of time.   Tape Rash        Medication List     STOP taking these medications    rivaroxaban 20 MG Tabs tablet Commonly known as: XARELTO       TAKE these medications    acetaminophen 500 MG tablet Commonly known as: TYLENOL Take 1,000 mg by mouth every 6 (six) hours as needed for pain or fever.   AeroChamber MV inhaler Use as instructed   albuterol 108 (90 Base) MCG/ACT inhaler Commonly known as: VENTOLIN HFA Inhale 2 puffs into the lungs every 4 (four) hours as needed.   alendronate 70 MG tablet Commonly known as: FOSAMAX Take 70 mg by mouth once a week.   esomeprazole 40 MG capsule Commonly known as: NEXIUM Take 40 mg by mouth daily.   furosemide 40 MG tablet Commonly known as: LASIX Take 40 mg by mouth 2 (two) times daily.   gabapentin 300 MG capsule Commonly known as: NEURONTIN Take 300 mg by mouth 3 (three) times daily.   ipratropium 0.03 % nasal spray Commonly known as: ATROVENT Place into the nose.   isosorbide mononitrate 30 MG 24 hr tablet Commonly known as: IMDUR Take 30 mg by mouth daily.   Klor-Con M10 10 MEQ tablet Generic drug: potassium chloride Take 2 tablets by mouth 2 (two) times daily.   metolazone 5 MG tablet Commonly known as: ZAROXOLYN Take 5 mg by mouth daily. Takes on Friday   nitroGLYCERIN 0.4 MG SL tablet Commonly known as:  NITROSTAT Place 0.4 mg under the tongue every 5 (five) minutes x 2 doses as needed for chest pain.   predniSONE 10 MG tablet Commonly known as: DELTASONE Take 15 mg by mouth daily.   PreserVision AREDS 2 Caps Take 1 capsule by mouth 2 (two) times daily.   REFRESH OP Apply 1 drop to eye daily as needed (dry eyes).   rosuvastatin 10 MG tablet Commonly known as: CRESTOR Take 10 mg by mouth daily.   senna 8.6 MG Tabs tablet Commonly known as: SENOKOT Take 1 tablet by mouth daily as needed for mild constipation.   spironolactone 25 MG tablet Commonly known as: ALDACTONE Take 25 mg by mouth 2 (two) times daily.   triamcinolone ointment 0.1 % Commonly known as: KENALOG Apply 1 application  topically as needed.  Follow-up Information     Lynnea Ferrier, MD. Schedule an appointment as soon as possible for a visit.   Specialty: Internal Medicine Why: Hospital follow up this week Contact information: [address] [facility] [place] [address] [phone]         Marcina Millard, MD. Schedule an appointment as soon as possible for a visit.   Specialty: Cardiology Why: Follow up in 1-2 weeks regarding GI bleeding, off Xarelto Contact information: [address] [facility] [place] [address] [phone]                Discharge Exam: Ceasar Mons Weights   09/15/23 2000  Weight: 68.1 kg   General exam: awake, alert, no acute distress HEENT: atraumatic, clear conjunctiva, anicteric sclera, moist mucus membranes, hearing grossly normal  Respiratory system: CTAB, no wheezes, rales or rhonchi, normal respiratory effort. Cardiovascular system: normal S1/S2, RRR.   Gastrointestinal system: soft, NT, ND, no HSM felt, +bowel sounds. Central nervous system: A&O x 3. no gross focal neurologic deficits, normal speech Extremities: moves all, no edema, normal tone Skin: dry, intact, normal  temperature Psychiatry: normal mood, congruent affect, judgement and insight appear normal   Condition at discharge: stable  The results of significant diagnostics from this hospitalization (including imaging, microbiology, ancillary and laboratory) are listed below for reference.   Imaging Studies: No results found.  Microbiology: Results for orders placed or performed during the hospital encounter of 12/13/21  Surgical pcr screen     Status: None   Collection Time: 12/13/21  9:32 AM   Specimen: Nasal Mucosa; Nasal Swab  Result Value Ref Range Status   MRSA, PCR NEGATIVE NEGATIVE Final   Staphylococcus aureus NEGATIVE NEGATIVE Final    Comment: (NOTE) The Xpert SA Assay (FDA approved for NASAL specimens in patients 7 years of age and older), is one component of a comprehensive surveillance program. It is not intended to diagnose infection nor to guide or monitor treatment. Performed at [facility], [address] [place], [place] [address]     Labs: CBC: Recent Labs  Lab 09/09/23 1611 09/10/23 0045 09/10/23 0751 09/11/23 0623 09/15/23 0204 09/15/23 1203 09/15/23 1858 09/16/23 0440  WBC 8.8  --   --   --  8.8  --   --  6.9  NEUTROABS 7.4  --   --   --  6.4  --   --   --   HGB 12.5 11.4*   < > 10.0* 6.4* 8.7* 9.1* 8.5*  HCT 37.8 34.4*  --   --  19.4* 26.0* 27.3* 25.3*  MCV 95.0  --   --   --  95.6  --   --  88.5  PLT 241  --   --   --  166  --   --  138*   < > = values in this interval not displayed.   Basic Metabolic Panel: Recent Labs  Lab 09/09/23 1611 09/11/23 0623 09/15/23 0204 09/15/23 1858 09/16/23 0440  NA 133* 139 131* 130* 133*  K 4.3 2.9* 3.3* 3.5 3.4*  CL 91* 95* 94* 93* 98  CO2 30 27 26 25 27   GLUCOSE 135* 184* 124* 185* 104*  BUN 40* 42* 36* 31* 26*  CREATININE 1.53* 1.49* 1.46* 1.31* 1.02*  CALCIUM 9.5 8.9 8.2* 8.4* 8.5*   Liver Function Tests: Recent Labs  Lab 09/09/23 1611 09/15/23 0204  AST 29 25  ALT 23 15   ALKPHOS 32*  20*  BILITOT 1.2 0.8  PROT 5.9* 4.6*  ALBUMIN 3.8 2.8*   CBG: No results for input(s): "GLUCAP" in the last 168 hours.  Discharge time spent: greater than 30 minutes.  Signed: Pennie Banter, DO [facility] 09/16/2023

## 2023-09-16 NOTE — ED Notes (Addendum)
 Pt is a 1x assist to the bedside commode

## 2023-09-16 NOTE — Evaluation (Signed)
 Physical Therapy Evaluation Patient Details Name: Charlotte Glover MRN: [id number] DOB: 09-Aug-1927 Today's Date: 09/16/2023  History of Present Illness  Pt is a 89 y.o. female with medical history significant for HFpEF, atrial fibrillation on anticoagulation with Xarelto, s/p PPM, GERD, hypertension, dyslipidemia, and pulmonary hypertension, who presented to the ER with acute onset of bright red bleeding per rectum.  MD assessment includes: GI bleed, ABLA, hypokalemia, and hyponatremia.   Clinical Impression  Pt was pleasant and motivated to participate during the session and put forth good effort throughout. Pt required no physical assistance and demonstrated good control and stability with transfers and gait.  Pt able to amb 60 feet with slow cadence but no overt LOB with min verbal cues for amb closer to the RW with more upright posture for decreased UE WB on the walker.  Pt's SpO2 and HR both WNL on room air during the session. Pt will benefit from continued PT services upon discharge to safely address deficits listed in patient problem list for decreased caregiver assistance and eventual return to PLOF.          If plan is discharge home, recommend the following: A little help with walking and/or transfers;A little help with bathing/dressing/bathroom;Assist for transportation;Help with stairs or ramp for entrance   Can travel by private vehicle        Equipment Recommendations Rolling walker (2 wheels)  Recommendations for Other Services       Functional Status Assessment Patient has had a recent decline in their functional status and demonstrates the ability to make significant improvements in function in a reasonable and predictable amount of time.     Precautions / Restrictions Precautions Precautions: Fall Restrictions Weight Bearing Restrictions Per Provider Order: No      Mobility  Bed Mobility Overal bed mobility: Modified Independent             General  bed mobility comments: Min extra time and effort only    Transfers Overall transfer level: Needs assistance Equipment used: Rolling walker (2 wheels) Transfers: Sit to/from Stand Sit to Stand: Supervision           General transfer comment: Good eccentric and concentric control and stability    Ambulation/Gait Ambulation/Gait assistance: Supervision Gait Distance (Feet): 60 Feet Assistive device: Rolling walker (2 wheels) Gait Pattern/deviations: Step-through pattern, Decreased step length - right, Decreased step length - left Gait velocity: decreased     General Gait Details: Slow cadence with short B step length, cues given for upright posture with less WB through her UEs, no overt LOB  Stairs            Wheelchair Mobility     Tilt Bed    Modified Rankin (Stroke Patients Only)       Balance Overall balance assessment: Needs assistance   Sitting balance-Leahy Scale: Normal     Standing balance support: Bilateral upper extremity supported, During functional activity Standing balance-Leahy Scale: Good                               Pertinent Vitals/Pain Pain Assessment Pain Assessment: No/denies pain    Home Living Family/patient expects to be discharged to:: Private residence Living Arrangements: Spouse/significant other Available Help at Discharge: Family;Available 24 hours/day Type of Home: House Home Access: Stairs to enter Entrance Stairs-Rails: Right;Left;Can reach both Entrance Stairs-Number of Steps: 1 small threshold in the front, 2 steps with bilateral narrow rails in  the garage   Home Layout: One level Home Equipment: Rollator (4 wheels);Shower seat - built in;Grab bars - toilet;Grab bars - tub/shower      Prior Function Prior Level of Function : Independent/Modified Independent             Mobility Comments: Mod Ind amb with a rollator limited community distances, no fall history ADLs Comments: Ind with ADLs      Extremity/Trunk Assessment   Upper Extremity Assessment Upper Extremity Assessment: Generalized weakness    Lower Extremity Assessment Lower Extremity Assessment: Generalized weakness       Communication   Communication Factors Affecting Communication: Hearing impaired    Cognition Arousal: Alert Behavior During Therapy: WFL for tasks assessed/performed   PT - Cognitive impairments: No apparent impairments                         Following commands: Intact       Cueing Cueing Techniques: Verbal cues, Tactile cues     General Comments      Exercises     Assessment/Plan    PT Assessment Patient needs continued PT services  PT Problem List Decreased strength;Decreased activity tolerance;Decreased balance;Decreased mobility;Decreased knowledge of use of DME       PT Treatment Interventions DME instruction;Gait training;Stair training;Functional mobility training;Therapeutic activities;Therapeutic exercise;Balance training;Patient/family education    PT Goals (Current goals can be found in the Care Plan section)  Acute Rehab PT Goals Patient Stated Goal: To get stronger PT Goal Formulation: With patient Time For Goal Achievement: 09/29/23 Potential to Achieve Goals: Good    Frequency Min 2X/week     Co-evaluation               AM-PAC PT "6 Clicks" Mobility  Outcome Measure Help needed turning from your back to your side while in a flat bed without using bedrails?: A Little Help needed moving from lying on your back to sitting on the side of a flat bed without using bedrails?: A Little Help needed moving to and from a bed to a chair (including a wheelchair)?: A Little Help needed standing up from a chair using your arms (e.g., wheelchair or bedside chair)?: A Little Help needed to walk in hospital room?: A Little Help needed climbing 3-5 steps with a railing? : A Little 6 Click Score: 18    End of Session Equipment Utilized During  Treatment: Gait belt Activity Tolerance: Patient tolerated treatment well Patient left: in bed;with call bell/phone within reach;with bed alarm set Nurse Communication: Mobility status PT Visit Diagnosis: Difficulty in walking, not elsewhere classified (R26.2);Muscle weakness (generalized) (M62.81)    Time: 1610-9604 PT Time Calculation (min) (ACUTE ONLY): 25 min   Charges:   PT Evaluation $PT Eval Moderate Complexity: 1 Mod   PT General Charges $$ ACUTE PT VISIT: 1 Visit    D. Scott Filomena Pokorney PT, DPT 09/16/23, 2:27 PM

## 2023-09-16 NOTE — ED Notes (Signed)
 Warm blanket provided by this RN per pt request

## 2023-09-16 NOTE — TOC Transition Note (Signed)
 Transition of Care [facility]) - Discharge Note   Patient Details  Name: Charlotte Glover MRN: [id number] Date of Birth: 08/12/1927  [facility] CM/SW Contact:  Elberta Fortis, RN Phone Number: 09/16/2023, 3:15 PM   Clinical Narrative:  Met with patient in her ED room. She lives at home with her spouse. She has a son and daughter in law that take her to appointments, etc. Pt no longer drives. She uses a Retail banker and PT is recommending a front wheel walker to use when leaving the house. Patient is able to get a front wheel walker at her church or at a thrift store.  PT is recommending home health for further PT. Patient is choosing [facility]. Spoke with Kandee Keen at [facility] and they will accept patient. Plan is to discharge home today.     Final next level of care: Home w Home Health Services Barriers to Discharge: Barriers Resolved   Patient Goals and CMS Choice   CMS Medicare.gov Compare Post Acute Care list provided to:: Patient Choice offered to / list presented to : Patient      Discharge Placement                       Discharge Plan and Services Additional resources added to the After Visit Summary for                            [facility] Arranged: PT HH Agency: [facility] Date [facility] Agency Contacted: 09/16/23 Time HH Agency Contacted: 1514 Representative spoke with at [facility] Agency: Kandee Keen at [facility]  Social Drivers of Health (SDOH) Interventions SDOH Screenings   Food Insecurity: No Food Insecurity (09/15/2023)  Housing: Low Risk  (09/15/2023)  Transportation Needs: No Transportation Needs (09/15/2023)  Utilities: Not At Risk (09/15/2023)  Financial Resource Strain: Low Risk  (02/15/2023)   Received from [facility]  Social Connections: Moderately Integrated (09/15/2023)  Tobacco Use: Low Risk  (09/15/2023)     Readmission Risk Interventions    09/16/2023    3:09 PM  Readmission Risk Prevention Plan  Transportation  Screening Complete  Medication Review (RN Care Manager) Complete  HRI or Home Care Consult Complete  SW Recovery Care/Counseling Consult Complete  Palliative Care Screening Not Applicable  Skilled Nursing Facility Not Applicable

## 2023-09-16 NOTE — Consult Note (Signed)
 Charlotte Minium, MD [facility]  [address] [place], [place] [address] Phone: [phone] Fax : [phone]  Consultation  Referring Provider:     Dr. Denton Lank Primary Care Physician:  Lynnea Ferrier, MD Primary Gastroenterologist:      Dr. Mia Creek     Reason for Consultation:     Rectal bleeding  Date of Admission:  09/15/2023 Date of Consultation:  09/16/2023         HPI:   Charlotte Glover is a 89 y.o. female who is well-known to me after having an admission recently while on Xarelto and presenting with a lower GI bleed.  The patient underwent a colonoscopy with multiple diverticuli seen throughout the entire colon.  There is no blood seen on the right side the colon but blood seen on the left side of the colon.  The patient and the family was explained that the patient likely had a diverticular bleed.  The patient had gone home and restarted her anticoagulation and came in after having some rectal bleeding again with a drop in her hemoglobin from 10.0 on discharge 5 days ago to 6.4.  The patient was given 2 units of blood and her blood count this morning was 8.5.  The patient has not had any further bleeding since being admitted to the hospital.  She denies any abdominal pain.  The patient is accompanied by family including her son and daughter-in-law in addition to her husband.  Past Medical History:  Diagnosis Date   (HFpEF) heart failure with preserved ejection fraction (HCC) 06/21/2020   a.) TTE 06/21/2020: EF >55%, RAE, G1DD   Amaurosis fugax of left eye    Arthritis    Atrial fibrillation (HCC)    a.) Dx'd in 2009; b.) CHA2DS2-VASc = 6 (age x 2, sex, CHF, HTN, prior MI); c.) s/p PVI 09/31/2013; DCCV (200 J x 1) 04/02/2012, repeat PVI with roof line, coronary sinus isolation, and CFAE ablation 01/02/2013; DCCV 01/18/2013 (200 J x 1); AV nodal ablation 05/28/2014;  d.) rate/rhythm maintained without pharmacological intervention; chronically anticoagulated using  rivaroxaban   Cardiac arrest (HCC) 10/2009   a.) witnessed arrest at home; husband performed CPR; ROSC achieved in the field. Admitted for workup --> etiology of CV event undetermined; felt to be secondary to "electrolyte abnormalities".   Cardiac murmur    CHB (complete heart block) (HCC)    a.) s/p PPM 05/28/2014; b.) CRT-P upgrade 07/13/2016.   Chickenpox    CKD (chronic kidney disease), stage III (HCC)    Coronary artery disease    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/13/2013   a.) grade II; ER/PR (+)   Dyslipidemia 09/15/2023   Dyspnea    Edema    GERD (gastroesophageal reflux disease)    Granulomatous disease (HCC)    a.) spleen   Hiatal hernia    HLD (hyperlipidemia)    HOH (hard of hearing)    Hypertension 01/23/2012   Jackhammer esophagus    Long term current use of antithrombotics/antiplatelets    a.) rivaroxaban   Melanoma of lower leg, left (HCC)    Meningioma, multiple (HCC) 07/14/2019   a.) MRI brain 07/14/2019: 1.2 x 1.3 x 1.3 cm midline posterior fossa meningioma inferior to the cerebellar vertex; 7 x 3 mm dural based meningioma along frontal convexity   NSTEMI (non-ST elevated myocardial infarction) (HCC) 08/2009   Presence of permanent cardiac pacemaker    Pulmonary HTN (HCC) 05/08/2016  a.) R/LHC 05/08/2016: EF >55, LVEDP 15, mean PA 38, AO sat 97, RVSP 50, CO 3 L/min   S/P ablation of atrial fibrillation    Shingles    SSS (sick sinus syndrome) (HCC)    a.) CRT-P in place   Valvular heart disease 02/08/2012   a.) cMRI 02/08/2012: EF 50-55, mild-mod AR, mod MR; b.) TEE 02/26/2012: EF >55, mild MR/AR/TR; c.) R/LHC 05/08/2016: sev MR; d.) TEE 06/14/2016: EF >55, sev MR, mod AR/TR; e.) TTE 10/05/2016: EF >55, triv PR, mod AR/MR/TR; f.) TTE 04/22/2018: EF >55, mild MR/TR/PR, mod AR; g.) TTE 11/27/2019: EF 50, mild PR, mod AR/MR/TR; h.) TTE 06/21/2020: EF >55, triv PR, mod AR/MR/TR   Wears hearing aid in both ears     Past Surgical History:   Procedure Laterality Date   ANTERIOR VITRECTOMY Left 03/20/2017   Procedure: ANTERIOR VITRECTOMY;  Surgeon: Lockie Mola, MD;  Location: [facility];  Service: Ophthalmology;  Laterality: Left;  IVA TOPICAL LEFT   ARTERY BIOPSY Right 04/08/2019   Procedure: BIOPSY TEMPORAL ARTERY;  Surgeon: Annice Needy, MD;  Location: [facility];  Service: Vascular;  Laterality: Right;   ATRIAL FIBRILLATION ABLATION N/A 03/24/2012   Procedure: ATRIAL FIBRILLATION ABLATION (PVI); Location: [facility]   ATRIAL FIBRILLATION ABLATION N/A 01/02/2013   Procedure: ATRIAL FIBRILLATION ABLATION (PVI, roof line, coronary sinus siolation, CFAE ablation); Location: [facility]   AV NODE ABLATION N/A 05/26/2014   Procedure: AV NODE ABLATION; Location: [facility]   BIV PACEMAKER INSERTION CRT-P N/A 07/13/2016   Procedure: CRT-P BiV PACEMAKER UPGRADE; Location: [facility]   BREAST BIOPSY Right 2015   + for DCIS    BREAST SURGERY     CARDIAC CATHETERIZATION Bilateral 05/08/2016   Procedure: Right/Left Heart Cath and Coronary Angiography;  Surgeon: Marcina Millard, MD;  Location: [facility];  Service: Cardiovascular;  Laterality: Bilateral;   CARDIOVERSION N/A 04/02/2012   Procedure: CARDIOVERSION; Location: [facility]   CARDIOVERSION N/A 01/15/2013   Procedure: CARDIOVERSION; Location: [facility]   CATARACT EXTRACTION W/PHACO Left 03/20/2017   Procedure: IOL EXCHANGE;  Surgeon: Lockie Mola, MD;  Location: [facility];  Service: Ophthalmology;  Laterality: Left;   CHOLECYSTECTOMY     COLONOSCOPY     patient reports several   COLONOSCOPY N/A 09/11/2023   Procedure: COLONOSCOPY;  Surgeon: Charlotte Minium, MD;  Location: [facility];  Service: Endoscopy;  Laterality: N/A;   DILATION AND CURETTAGE OF UTERUS     ESOPHAGOGASTRODUODENOSCOPY     ESOPHAGOGASTRODUODENOSCOPY (EGD) WITH PROPOFOL N/A 12/13/2020   Procedure: ESOPHAGOGASTRODUODENOSCOPY (EGD) WITH PROPOFOL;  Surgeon: Regis Bill, MD;  Location:  [facility];  Service: Endoscopy;  Laterality: N/A;   KNEE ARTHROPLASTY Right 12/25/2021   Procedure: COMPUTER ASSISTED TOTAL KNEE ARTHROPLASTY;  Surgeon: Donato Heinz, MD;  Location: [facility];  Service: Orthopedics;  Laterality: Right;   PACEMAKER INSERTION N/A 05/28/2014   Procedure: PACEMAKER INSERTION (dual chamber); Location: [facility]   PERIPHERAL IRIDOTOMY Left 03/20/2017   Procedure: PERIPHERAL IRIDECTOMY;  Surgeon: Lockie Mola, MD;  Location: [facility];  Service: Ophthalmology;  Laterality: Left;   ROTATOR CUFF REPAIR Left    TEE WITHOUT CARDIOVERSION N/A 05/16/2016   Procedure: TRANSESOPHAGEAL ECHOCARDIOGRAM (TEE);  Surgeon: Dalia Heading, MD;  Location: [facility];  Service: Cardiovascular;  Laterality: N/A;   TONSILLECTOMY     uterine polyp      Prior to Admission medications   Medication Sig Start Date End Date Taking? Authorizing Provider  acetaminophen (TYLENOL) 500 MG tablet Take 1,000 mg by  mouth every 6 (six) hours as needed for pain or fever.   Yes [provider]  albuterol (VENTOLIN HFA) 108 (90 Base) MCG/ACT inhaler Inhale 2 puffs into the lungs every 4 (four) hours as needed. 07/02/23  Yes Becky Augusta, NP  esomeprazole (NEXIUM) 40 MG capsule Take 40 mg by mouth daily.   Yes [provider]  furosemide (LASIX) 40 MG tablet Take 40 mg by mouth 2 (two) times daily.  03/30/16  Yes [provider]  gabapentin (NEURONTIN) 300 MG capsule Take 300 mg by mouth 3 (three) times daily.   Yes [provider]  isosorbide mononitrate (IMDUR) 30 MG 24 hr tablet Take 30 mg by mouth daily. 04/24/23 04/23/24 Yes [provider]  KLOR-CON M10 10 MEQ tablet Take 2 tablets by mouth 2 (two) times daily. 01/14/23  Yes [provider]  metolazone (ZAROXOLYN) 5 MG tablet Take 5 mg by mouth daily. Takes on Friday   Yes [provider]  Multiple Vitamins-Minerals (PRESERVISION AREDS 2) CAPS Take 1 capsule by mouth 2 (two)  times daily.   Yes [provider]  nitroGLYCERIN (NITROSTAT) 0.4 MG SL tablet Place 0.4 mg under the tongue every 5 (five) minutes x 2 doses as needed for chest pain.  04/01/13  Yes [provider]  Polyvinyl Alcohol-Povidone (REFRESH OP) Apply 1 drop to eye daily as needed (dry eyes).   Yes [provider]  predniSONE (DELTASONE) 10 MG tablet Take 15 mg by mouth daily. 01/28/23  Yes [provider]  rivaroxaban (XARELTO) 20 MG TABS tablet Take 20 mg by mouth daily after breakfast.   Yes [provider]  rosuvastatin (CRESTOR) 10 MG tablet Take 10 mg by mouth daily.   Yes [provider]  senna (SENOKOT) 8.6 MG TABS tablet Take 1 tablet by mouth daily as needed for mild constipation.   Yes [provider]  spironolactone (ALDACTONE) 25 MG tablet Take 25 mg by mouth 2 (two) times daily.   Yes [provider]  triamcinolone ointment (KENALOG) 0.1 % Apply 1 application  topically as needed.   Yes [provider]  alendronate (FOSAMAX) 70 MG tablet Take 70 mg by mouth once a week. Patient not taking: Reported on 09/15/2023 05/14/23 05/13/24  [provider]  ipratropium (ATROVENT) 0.03 % nasal spray Place into the nose. 07/02/23 07/16/23  [provider]  Spacer/Aero-Holding Deretha Emory (AEROCHAMBER MV) inhaler Use as instructed 07/02/23   Becky Augusta, NP    Family History  Problem Relation Age of Onset   Breast cancer Mother    Cancer Sister    Cancer Brother    Cancer Daughter    Breast cancer Daughter    Cancer Son      Social History   Tobacco Use   Smoking status: Never   Smokeless tobacco: Never  Vaping Use   Vaping status: Never Used  Substance Use Topics   Alcohol use: Yes    Alcohol/week: 7.0 standard drinks of alcohol    Types: 7 Shots of liquor per week    Comment:  (1 scotch daily)   Drug use: No    Allergies as of 09/15/2023 - Review Complete 09/15/2023  Allergen Reaction Noted    Amiodarone Other (See Comments) 07/11/2012   Baclofen Other (See Comments) 04/06/2019   Metoprolol Rash 11/28/2012   Pantoprazole Rash 03/13/2013   Silicone Rash 12/04/2013   Tape Rash 12/04/2013    Review of Systems:    All systems reviewed and negative  except where noted in HPI.   Physical Exam:  Vital signs in last 24 hours: Temp:  [97.8 F (36.6 C)-98.4 F (36.9 C)] 98.4 F (36.9 C) (03/24 1212) Pulse Rate:  [69-71] 69 (03/24 1139) Resp:  [10-26] 18 (03/24 1139) BP: (120-173)/(55-97) 149/62 (03/24 1139) SpO2:  [92 %-100 %] 100 % (03/24 1139) Weight:  [68.1 kg] 68.1 kg (03/23 2000) Last BM Date : 09/11/23 General:   Pleasant, cooperative in NAD Head:  Normocephalic and atraumatic. Eyes:   No icterus.   Conjunctiva pink. PERRLA. Ears:  Normal auditory acuity. Neck:  Supple; no masses or thyroidomegaly Lungs: Respirations even and unlabored. Lungs clear to auscultation bilaterally.   No wheezes, crackles, or rhonchi.  Heart:  Regular rate and rhythm;  Without murmur, clicks, rubs or gallops Abdomen:  Soft, nondistended, nontender. Normal bowel sounds. No appreciable masses or hepatomegaly.  No rebound or guarding.  Rectal:  Not performed. Msk:  Symmetrical without gross deformities.    Extremities:  Without edema, cyanosis or clubbing. Neurologic:  Alert and oriented x3;  grossly normal neurologically. Skin:  Intact without significant lesions or rashes. Cervical Nodes:  No significant cervical adenopathy. Psych:  Alert and cooperative. Normal affect.  LAB RESULTS: Recent Labs    09/15/23 0204 09/15/23 1203 09/15/23 1858 09/16/23 0440  WBC 8.8  --   --  6.9  HGB 6.4* 8.7* 9.1* 8.5*  HCT 19.4* 26.0* 27.3* 25.3*  PLT 166  --   --  138*   BMET Recent Labs    09/15/23 0204 09/15/23 1858 09/16/23 0440  NA 131* 130* 133*  K 3.3* 3.5 3.4*  CL 94* 93* 98  CO2 26 25 27   GLUCOSE 124* 185* 104*  BUN 36* 31* 26*  CREATININE 1.46* 1.31* 1.02*  CALCIUM 8.2* 8.4*  8.5*   LFT Recent Labs    09/15/23 0204  PROT 4.6*  ALBUMIN 2.8*  AST 25  ALT 15  ALKPHOS 20*  BILITOT 0.8   PT/INR No results for input(s): "LABPROT", "INR" in the last 72 hours.  STUDIES: No results found.    Impression / Plan:   Assessment: Principal Problem:   GI bleeding Active Problems:   Paroxysmal atrial fibrillation (HCC)   Peripheral neuropathy   Dyslipidemia   Hypokalemia   Hyponatremia   Stage 3b chronic kidney disease (CKD) (HCC)   ABLA (acute blood loss anemia)   Lower GI bleed   Charlotte Glover is a 89 y.o. y/o female with who was admitted with recurrent GI bleeding on anticoagulation.  The patient left the hospital with a hemoglobin of 10 and came back with a hemoglobin of 6.4 few days later with recurrent bleeding.  The patient's colonoscopy showed the patient to have had blood in the left side of the colon near diverticulosis without any obvious blood on the right side the colon indicating that this is likely a left-sided diverticular bleed.  The patient has had no further bleeding since being admitted.  They requested to be seen by GI.  Plan:  The patient and her family have been explained that the cause of the bleeding is diverticular and not amenable to endoscopic therapy at this time.  The patient has been recommended to have a CT angiography if she should have any rebleeding and undergo interventional radiology embolization versus vascular surgery embolization.  The patient is not presenting like an upper GI bleed and needs no upper endoscopy or visualization of small bowel at this time.  The family has  been told this in detail and if the patient does not need to be on anticoagulation then the patient can greatly decrease her risk of recurrent admissions and GI bleeding if she can come off the anticoagulation.  Nothing further to do from a GI point of view.   Thank you for involving me in the care of this patient.      LOS: 1 day   Charlotte Minium, MD, MD. Clementeen Graham 09/16/2023, 1:10 PM,  Pager [phone] 7am-5pm  Check AMION for 5pm -7am coverage and on weekends   Note: This dictation was prepared with Dragon dictation along with smaller phrase technology. Any transcriptional errors that result from this process are unintentional.

## 2023-09-26 ENCOUNTER — Encounter: Payer: Self-pay | Admitting: Emergency Medicine

## 2023-09-26 ENCOUNTER — Ambulatory Visit: Admission: EM | Admit: 2023-09-26 | Discharge: 2023-09-26 | Disposition: A | Discharge status: Home / Self Care

## 2023-09-26 DIAGNOSIS — S81811A Laceration without foreign body, right lower leg, initial encounter: Secondary | ICD-10-CM | POA: Insufficient documentation

## 2023-09-26 MED ORDER — MUPIROCIN 2 % EX OINT: 1.0000 | TOPICAL_OINTMENT | Freq: Two times a day (BID) | CUTANEOUS | 0 refills | Status: AC

## 2023-09-26 NOTE — ED Triage Notes (Signed)
 Pt has a skin tear to her right shin 2 days ago. She fell off the bed and scrapped her leg on the bed. She has been trying to treat at home.

## 2023-09-26 NOTE — ED Provider Notes (Signed)
 [facility]    CSN: [id number] Arrival date & time: 09/26/23  1132      History   Chief Complaint Chief Complaint  Patient presents with   skin tear     HPI Charlotte Glover is a 89 y.o. female.   89 year old female, Charlotte Glover, presents to urgent care for evaluation of skin tear to right lower leg that occurred 2 days prior.  Patient states she fell the bed and scraped her leg as she was falling.  Patient has been treating the area at home with over-the-counter meds, " just want a get checked out".  Tetanus is up-to-date less than 5 years per patient report.   The history is provided by the patient and a relative. No language interpreter was used.    Past Medical History:  Diagnosis Date   (HFpEF) heart failure with preserved ejection fraction (HCC) 06/21/2020   a.) TTE 06/21/2020: EF >55%, RAE, G1DD   Amaurosis fugax of left eye    Arthritis    Atrial fibrillation (HCC)    a.) Dx'd in 2009; b.) CHA2DS2-VASc = 6 (age x 2, sex, CHF, HTN, prior MI); c.) s/p PVI 09/31/2013; DCCV (200 J x 1) 04/02/2012, repeat PVI with roof line, coronary sinus isolation, and CFAE ablation 01/02/2013; DCCV 01/18/2013 (200 J x 1); AV nodal ablation 05/28/2014;  d.) rate/rhythm maintained without pharmacological intervention; chronically anticoagulated using rivaroxaban   Cardiac arrest (HCC) 10/2009   a.) witnessed arrest at home; husband performed CPR; ROSC achieved in the field. Admitted for workup --> etiology of CV event undetermined; felt to be secondary to "electrolyte abnormalities".   Cardiac murmur    CHB (complete heart block) (HCC)    a.) s/p PPM 05/28/2014; b.) CRT-P upgrade 07/13/2016.   Chickenpox    CKD (chronic kidney disease), stage III (HCC)    Coronary artery disease    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/13/2013   a.) grade II; ER/PR (+)   Dyslipidemia 09/15/2023   Dyspnea    Edema    GERD (gastroesophageal reflux disease)     Granulomatous disease (HCC)    a.) spleen   Hiatal hernia    HLD (hyperlipidemia)    HOH (hard of hearing)    Hypertension 01/23/2012   Jackhammer esophagus    Long term current use of antithrombotics/antiplatelets    a.) rivaroxaban   Melanoma of lower leg, left (HCC)    Meningioma, multiple (HCC) 07/14/2019   a.) MRI brain 07/14/2019: 1.2 x 1.3 x 1.3 cm midline posterior fossa meningioma inferior to the cerebellar vertex; 7 x 3 mm dural based meningioma along frontal convexity   NSTEMI (non-ST elevated myocardial infarction) (HCC) 08/2009   Presence of permanent cardiac pacemaker    Pulmonary HTN (HCC) 05/08/2016   a.) R/LHC 05/08/2016: EF >55, LVEDP 15, mean PA 38, AO sat 97, RVSP 50, CO 3 L/min   S/P ablation of atrial fibrillation    Shingles    SSS (sick sinus syndrome) (HCC)    a.) CRT-P in place   Valvular heart disease 02/08/2012   a.) cMRI 02/08/2012: EF 50-55, mild-mod AR, mod MR; b.) TEE 02/26/2012: EF >55, mild MR/AR/TR; c.) R/LHC 05/08/2016: sev MR; d.) TEE 06/14/2016: EF >55, sev MR, mod AR/TR; e.) TTE 10/05/2016: EF >55, triv PR, mod AR/MR/TR; f.) TTE 04/22/2018: EF >55, mild MR/TR/PR, mod AR; g.) TTE 11/27/2019: EF 50, mild PR, mod AR/MR/TR; h.) TTE 06/21/2020: EF >55, triv PR, mod AR/MR/TR  Wears hearing aid in both ears     Patient Active Problem List   Diagnosis Date Noted   Lower GI bleed 09/16/2023   Lower GI bleeding 09/16/2023   GI bleeding 09/15/2023   Paroxysmal atrial fibrillation (HCC) 09/15/2023   Peripheral neuropathy 09/15/2023   Dyslipidemia 09/15/2023   Hypokalemia 09/15/2023   Hyponatremia 09/15/2023   Stage 3b chronic kidney disease (CKD) (HCC) 09/15/2023   ABLA (acute blood loss anemia) 09/15/2023   Rectal bleeding 09/11/2023   Hematochezia 09/10/2023   Bruising 05/28/2023   Right arm pain 05/28/2023   Noninfected skin tear of right leg 05/28/2023   GCA (giant cell arteritis) (HCC) 02/15/2023   Total knee replacement status 12/25/2021    Prediabetes 12/14/2021   Acquired thrombophilia (HCC) 08/07/2021   Primary osteoarthritis of right knee 04/09/2021   Swelling of limb 11/01/2020   Enlarged pulmonary artery (HCC) 05/23/2020   Moderate aortic insufficiency 03/07/2020   Intracranial carotid stenosis, right 05/29/2019   Chronic headaches 05/29/2019   Long term current use of systemic steroids 05/04/2019   Screening for osteoporosis 05/04/2019   Temporal arteritis (HCC) 04/07/2019   Meningioma (HCC) 12/29/2018   Chronic daily headache 11/20/2018   Elevated erythrocyte sedimentation rate 11/05/2018   CKD (chronic kidney disease) stage 3, GFR 30-59 ml/min (HCC) 12/27/2017   Shingles 05/06/2017   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 04/12/2017   Primary osteoarthritis of right shoulder 12/07/2016   Rotator cuff tear, right 12/07/2016   Rotator cuff tendinitis, right 12/07/2016   CHB (complete heart block) (HCC) 06/30/2016   Severe mitral regurgitation 05/29/2016   Pedal edema 04/23/2016   Lung nodule, multiple 12/28/2015   Amnesia, global, transient 04/05/2015   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 06/30/2014   Biventricular cardiac pacemaker in situ 05/18/2014   Sleep disorder 05/06/2014   Diffuse pain 04/06/2014   Fatigue 03/16/2014   Headache 03/16/2014   History of melanoma 11/24/2013   Melanoma (HCC) 11/24/2013   DCIS (ductal carcinoma in situ) 08/11/2013   History of ductal carcinoma in situ (DCIS) of breast 08/11/2013   H/O non-ST elevation myocardial infarction (NSTEMI) 01/01/2013   Hearing impairment 01/01/2013   S/P ablation of atrial fibrillation 07/11/2012   Edema 01/23/2012   Hypertension 01/23/2012   Atrial fibrillation (HCC) 11/27/2011   Coronary artery disease 11/27/2011    Past Surgical History:  Procedure Laterality Date   ANTERIOR VITRECTOMY Left 03/20/2017   Procedure: ANTERIOR VITRECTOMY;  Surgeon: Lockie Mola, MD;  Location:  [facility];  Service: Ophthalmology;  Laterality: Left;  IVA TOPICAL LEFT   ARTERY BIOPSY Right 04/08/2019   Procedure: BIOPSY TEMPORAL ARTERY;  Surgeon: Annice Needy, MD;  Location: [facility];  Service: Vascular;  Laterality: Right;   ATRIAL FIBRILLATION ABLATION N/A 03/24/2012   Procedure: ATRIAL FIBRILLATION ABLATION (PVI); Location: [facility]   ATRIAL FIBRILLATION ABLATION N/A 01/02/2013   Procedure: ATRIAL FIBRILLATION ABLATION (PVI, roof line, coronary sinus siolation, CFAE ablation); Location: [facility]   AV NODE ABLATION N/A 05/26/2014   Procedure: AV NODE ABLATION; Location: [facility]   BIV PACEMAKER INSERTION CRT-P N/A 07/13/2016   Procedure: CRT-P BiV PACEMAKER UPGRADE; Location: [facility]   BREAST BIOPSY Right 2015   + for DCIS    BREAST SURGERY     CARDIAC CATHETERIZATION Bilateral 05/08/2016   Procedure: Right/Left Heart Cath and Coronary Angiography;  Surgeon: Marcina Millard, MD;  Location: [facility];  Service: Cardiovascular;  Laterality: Bilateral;   CARDIOVERSION N/A 04/02/2012  Procedure: CARDIOVERSION; Location: [facility]   CARDIOVERSION N/A 01/15/2013   Procedure: CARDIOVERSION; Location: [facility]   CATARACT EXTRACTION W/PHACO Left 03/20/2017   Procedure: IOL EXCHANGE;  Surgeon: Lockie Mola, MD;  Location: [facility];  Service: Ophthalmology;  Laterality: Left;   CHOLECYSTECTOMY     COLONOSCOPY     patient reports several   COLONOSCOPY N/A 09/11/2023   Procedure: COLONOSCOPY;  Surgeon: Midge Minium, MD;  Location: [facility];  Service: Endoscopy;  Laterality: N/A;   DILATION AND CURETTAGE OF UTERUS     ESOPHAGOGASTRODUODENOSCOPY     ESOPHAGOGASTRODUODENOSCOPY (EGD) WITH PROPOFOL N/A 12/13/2020   Procedure: ESOPHAGOGASTRODUODENOSCOPY (EGD) WITH PROPOFOL;  Surgeon: Regis Bill, MD;  Location: [facility];  Service: Endoscopy;  Laterality: N/A;   KNEE ARTHROPLASTY Right 12/25/2021   Procedure: COMPUTER ASSISTED TOTAL KNEE ARTHROPLASTY;   Surgeon: Donato Heinz, MD;  Location: [facility];  Service: Orthopedics;  Laterality: Right;   PACEMAKER INSERTION N/A 05/28/2014   Procedure: PACEMAKER INSERTION (dual chamber); Location: [facility]   PERIPHERAL IRIDOTOMY Left 03/20/2017   Procedure: PERIPHERAL IRIDECTOMY;  Surgeon: Lockie Mola, MD;  Location: [facility];  Service: Ophthalmology;  Laterality: Left;   ROTATOR CUFF REPAIR Left    TEE WITHOUT CARDIOVERSION N/A 05/16/2016   Procedure: TRANSESOPHAGEAL ECHOCARDIOGRAM (TEE);  Surgeon: Dalia Heading, MD;  Location: [facility];  Service: Cardiovascular;  Laterality: N/A;   TONSILLECTOMY     uterine polyp      OB History   No obstetric history on file.      Home Medications    Prior to Admission medications   Medication Sig Start Date End Date Taking? Authorizing Provider  mupirocin ointment (BACTROBAN) 2 % Apply 1 Application topically 2 (two) times daily for 7 days. Right lower leg 09/26/23 10/03/23 Yes Jayle Solarz, Para March, NP  traMADol (ULTRAM) 50 MG tablet Take by mouth. 09/25/23  Yes [provider]  acetaminophen (TYLENOL) 500 MG tablet Take 1,000 mg by mouth every 6 (six) hours as needed for pain or fever.    [provider]  albuterol (VENTOLIN HFA) 108 (90 Base) MCG/ACT inhaler Inhale 2 puffs into the lungs every 4 (four) hours as needed. 07/02/23   Becky Augusta, NP  alendronate (FOSAMAX) 70 MG tablet Take 70 mg by mouth once a week. Patient not taking: Reported on 09/15/2023 05/14/23 05/13/24  [provider]  esomeprazole (NEXIUM) 40 MG capsule Take 40 mg by mouth daily.    [provider]  furosemide (LASIX) 40 MG tablet Take 40 mg by mouth 2 (two) times daily.  03/30/16   [provider]  gabapentin (NEURONTIN) 300 MG capsule Take 300 mg by mouth 3 (three) times daily.    [provider]  ipratropium (ATROVENT) 0.03 % nasal spray Place into the nose. 07/02/23 07/16/23  [provider]  isosorbide  mononitrate (IMDUR) 30 MG 24 hr tablet Take 30 mg by mouth daily. 04/24/23 04/23/24  [provider]  KLOR-CON M10 10 MEQ tablet Take 2 tablets by mouth 2 (two) times daily. 01/14/23   [provider]  metolazone (ZAROXOLYN) 5 MG tablet Take 5 mg by mouth daily. Takes on Friday    [provider]  Multiple Vitamins-Minerals (PRESERVISION AREDS 2) CAPS Take 1 capsule by mouth 2 (two) times daily.    [provider]  nitroGLYCERIN (NITROSTAT) 0.4 MG SL tablet Place 0.4 mg under the tongue every 5 (five) minutes x 2 doses as needed for chest pain.  04/01/13   [provider]  Polyvinyl Alcohol-Povidone (REFRESH OP) Apply 1 drop to eye daily as needed (dry eyes).    [provider]  predniSONE (DELTASONE) 10 MG tablet Take 15 mg by mouth daily. 01/28/23   [provider]  rosuvastatin (CRESTOR) 10 MG tablet Take 10 mg by mouth daily.    [provider]  senna (SENOKOT) 8.6 MG TABS tablet Take 1 tablet by mouth daily as needed for mild constipation.    [provider]  Spacer/Aero-Holding Chambers (AEROCHAMBER MV) inhaler Use as instructed 07/02/23   Becky Augusta, NP  spironolactone (ALDACTONE) 25 MG tablet Take 25 mg by mouth 2 (two) times daily.    [provider]  triamcinolone ointment (KENALOG) 0.1 % Apply 1 application  topically as needed.    [provider]    Family History Family History  Problem Relation Age of Onset   Breast cancer Mother    Cancer Sister    Cancer Brother    Cancer Daughter    Breast cancer Daughter    Cancer Son     Social History Social History   Tobacco Use   Smoking status: Never   Smokeless tobacco: Never  Vaping Use   Vaping status: Never Used  Substance Use Topics   Alcohol use: Yes    Alcohol/week: 7.0 standard drinks of alcohol    Types: 7 Shots of liquor per week    Comment:  (1 scotch daily)   Drug use: No     Allergies   Amiodarone, Baclofen,  Metoprolol, Pantoprazole, Silicone, and Tape   Review of Systems Review of Systems  Constitutional:  Negative for fever.  Skin:  Positive for wound.  All other systems reviewed and are negative.    Physical Exam Triage Vital Signs ED Triage Vitals  Encounter Vitals Group     BP 09/26/23 1151 (!) 124/54     Systolic BP Percentile --      Diastolic BP Percentile --      Pulse Rate 09/26/23 1151 70     Resp 09/26/23 1151 16     Temp 09/26/23 1151 98.1 F (36.7 C)     Temp Source 09/26/23 1151 Oral     SpO2 09/26/23 1151 97 %     Weight --      Height --      Head Circumference --      Peak Flow --      Pain Score 09/26/23 1150 0     Pain Loc --      Pain Education --      Exclude from Growth Chart --    No data found.  Updated Vital Signs BP (!) 124/54 (BP Location: Right Arm)   Pulse 70   Temp 98.1 F (36.7 C) (Oral)   Resp 16   SpO2 97%   Visual Acuity Right Eye Distance:   Left Eye Distance:   Bilateral Distance:    Right Eye Near:   Left Eye Near:    Bilateral Near:     Physical Exam Vitals and nursing note reviewed.  Constitutional:      Appearance: She is well-developed and well-groomed.  HENT:     Head: Normocephalic.  Cardiovascular:     Rate and Rhythm: Normal rate.     Pulses:          Dorsalis pedis pulses are 1+ on the right side.  Pulmonary:     Effort: Pulmonary effort is normal.  Skin:    General:  Skin is warm.     Capillary Refill: Capillary refill takes less than 2 seconds.          Comments: 25 cm x 2 cm skin tear noted to right lateral lower leg  Neurological:     General: No focal deficit present.     Mental Status: She is alert and oriented to person, place, and time.     GCS: GCS eye subscore is 4. GCS verbal subscore is 5. GCS motor subscore is 6.     Cranial Nerves: No cranial nerve deficit.     Sensory: No sensory deficit.  Psychiatric:        Attention and Perception: Attention normal.        Mood and Affect: Mood  normal.        Speech: Speech normal.        Behavior: Behavior normal. Behavior is cooperative.      UC Treatments / Results  Labs (all labs ordered are listed, but only abnormal results are displayed) Labs Reviewed - No data to display  EKG   Radiology No results found.  Procedures Procedures (including critical care time)  Medications Ordered in UC Medications - No data to display  Initial Impression / Assessment and Plan / UC Course  I have reviewed the triage vital signs and the nursing notes.  Pertinent labs & imaging results that were available during my care of the patient were reviewed by me and considered in my medical decision making (see chart for details).    Discussed exam findings and plan of care with patient, wound cleansed and redressed with nonstick telfa, strict go to ER precautions given.   Patient verbalized understanding to this provider.  Ddx: Skin tear, right lower leg injury Final Clinical Impressions(s) / UC Diagnoses   Final diagnoses:  Noninfected skin tear of right lower extremity, initial encounter     Discharge Instructions      Daily wound care, use mupirocin as directed, cover with nonstick Telfa dressing, follow-up with PCP Monday for recheck of wound.  If you develop fever, redness, pain, worsening symptoms: Go to ER for further evaluation     ED Prescriptions     Medication Sig Dispense Auth. Provider   mupirocin ointment (BACTROBAN) 2 % Apply 1 Application topically 2 (two) times daily for 7 days. Right lower leg 15 g Jette Lewan, NP      PDMP not reviewed this encounter.   Clancy Gourd, NP 09/26/23 1242

## 2023-09-26 NOTE — Discharge Instructions (Signed)
 Daily wound care, use mupirocin as directed, cover with nonstick Telfa dressing, follow-up with PCP Monday for recheck of wound.  If you develop fever, redness, pain, worsening symptoms: Go to ER for further evaluation

## 2023-10-30 ENCOUNTER — Emergency Department
Admission: EM | Admit: 2023-10-30 | Discharge: 2023-10-30 | Disposition: A | Discharge status: Home / Self Care | Attending: Emergency Medicine | Admitting: Emergency Medicine

## 2023-10-30 ENCOUNTER — Other Ambulatory Visit: Payer: Self-pay

## 2023-10-30 DIAGNOSIS — L97922 Non-pressure chronic ulcer of unspecified part of left lower leg with fat layer exposed: Secondary | ICD-10-CM | POA: Insufficient documentation

## 2023-10-30 DIAGNOSIS — Z4800 Encounter for change or removal of nonsurgical wound dressing: Secondary | ICD-10-CM | POA: Insufficient documentation

## 2023-10-30 DIAGNOSIS — Z5189 Encounter for other specified aftercare: Secondary | ICD-10-CM

## 2023-10-30 DIAGNOSIS — Z950 Presence of cardiac pacemaker: Secondary | ICD-10-CM | POA: Diagnosis not present

## 2023-10-30 DIAGNOSIS — I10 Essential (primary) hypertension: Secondary | ICD-10-CM | POA: Diagnosis not present

## 2023-10-30 DIAGNOSIS — L98492 Non-pressure chronic ulcer of skin of other sites with fat layer exposed: Secondary | ICD-10-CM

## 2023-10-30 DIAGNOSIS — Z7901 Long term (current) use of anticoagulants: Secondary | ICD-10-CM | POA: Diagnosis not present

## 2023-10-30 LAB — COMPREHENSIVE METABOLIC PANEL WITH GFR
ALT: 15 U/L (ref 0–44)
AST: 22 U/L (ref 15–41)
Albumin: 3.6 g/dL (ref 3.5–5.0)
Alkaline Phosphatase: 28 U/L — ABNORMAL LOW (ref 38–126)
Anion gap: 11 (ref 5–15)
BUN: 27 mg/dL — ABNORMAL HIGH (ref 8–23)
CO2: 31 mmol/L (ref 22–32)
Calcium: 9.1 mg/dL (ref 8.9–10.3)
Chloride: 92 mmol/L — ABNORMAL LOW (ref 98–111)
Creatinine, Ser: 1.41 mg/dL — ABNORMAL HIGH (ref 0.44–1.00)
GFR, Estimated: 35 mL/min — ABNORMAL LOW (ref >60)
Glucose, Bld: 119 mg/dL — ABNORMAL HIGH (ref 70–99)
Potassium: 3.1 mmol/L — ABNORMAL LOW (ref 3.5–5.1)
Sodium: 134 mmol/L — ABNORMAL LOW (ref 135–145)
Total Bilirubin: 0.9 mg/dL (ref 0.0–1.2)
Total Protein: 5.7 g/dL — ABNORMAL LOW (ref 6.5–8.1)

## 2023-10-30 LAB — CBC WITH DIFFERENTIAL/PLATELET
Abs Immature Granulocytes: 0.05 10*3/uL (ref 0.00–0.07)
Basophils Absolute: 0.1 10*3/uL (ref 0.0–0.1)
Basophils Relative: 1 %
Eosinophils Absolute: 0.1 10*3/uL (ref 0.0–0.5)
Eosinophils Relative: 3 %
HCT: 30.6 % — ABNORMAL LOW (ref 36.0–46.0)
Hemoglobin: 9 g/dL — ABNORMAL LOW (ref 12.0–15.0)
Immature Granulocytes: 1 %
Lymphocytes Relative: 22 %
Lymphs Abs: 0.9 10*3/uL (ref 0.7–4.0)
MCH: 25.5 pg — ABNORMAL LOW (ref 26.0–34.0)
MCHC: 29.4 g/dL — ABNORMAL LOW (ref 30.0–36.0)
MCV: 86.7 fL (ref 80.0–100.0)
Monocytes Absolute: 0.7 10*3/uL (ref 0.1–1.0)
Monocytes Relative: 16 %
Neutro Abs: 2.4 10*3/uL (ref 1.7–7.7)
Neutrophils Relative %: 57 %
Platelets: 256 10*3/uL (ref 150–400)
RBC: 3.53 MIL/uL — ABNORMAL LOW (ref 3.87–5.11)
RDW: 19.1 % — ABNORMAL HIGH (ref 11.5–15.5)
WBC: 4.3 10*3/uL (ref 4.0–10.5)
nRBC: 0 % (ref 0.0–0.2)

## 2023-10-30 MED ORDER — CEPHALEXIN 500 MG PO CAPS: 500.0000 mg | ORAL_CAPSULE | Freq: Two times a day (BID) | ORAL | 0 refills | Status: AC

## 2023-10-30 MED ORDER — DOXYCYCLINE HYCLATE 100 MG PO TABS: 100.0000 mg | ORAL_TABLET | Freq: Two times a day (BID) | ORAL | 0 refills | Status: AC

## 2023-10-30 MED ORDER — ACETAMINOPHEN 500 MG PO TABS
1000.0000 mg | ORAL_TABLET | Freq: Once | ORAL | Status: AC
  Administered 2023-10-30: 1000 mg via ORAL
  Filled 2023-10-30: qty 2

## 2023-10-30 NOTE — ED Notes (Signed)
 Dr. Fuller Plan, EDP at bedside for evaluation.

## 2023-10-30 NOTE — ED Triage Notes (Signed)
 Pt states swelling/wound to L lower leg. Pt denies fevers or chills. Pt states wound ongoing for 1 month after D/C. Wound dressed by pt in triage.

## 2023-10-30 NOTE — ED Notes (Signed)
 Redressed wound to the LLE with non-adherent dressing and ACE bandage

## 2023-10-30 NOTE — ED Provider Notes (Signed)
 [facility] Provider Note    Event Date/Time   First MD Initiated Contact with Patient 10/30/23 [phone]     (approximate)   History   Wound Check   HPI  Charlotte Glover is a 89 y.o. female with history of A-fib, pacemaker, hypertension, hyper anemia who comes in with concerns for leg wound.  I reviewed the notes were patient was admitted from 3/23 until 3/24 for a GI bleed in the setting of Xarelto .  She got up at urgent care on 09/26/2023 for a skin tear.  I reviewed the cardiology note from 10/08/2023 where is noted the patient has chronic pedal edema left greater than right.   Patient reports that after coming to the hospital she got on too much fluid, blood and she had a lot of leaking of fluid out of her skin.  She reports some wounds all over her body from where the fluid leaked out when being on her left lower leg.  She reports that the wound has been a constant but now slightly increased at about a quarter sized wound with occasional leaking fluid but today she noted more pain associated with the wound and was concerned that it could be infected.  She reports that now there was a little bit of blood-tinged drainage.  She denies any new swelling of the leg, calf tenderness or other concerns.  She denies any falls and landing on the leg.  She is still able to ambulate well.      Physical Exam   Triage Vital Signs: ED Triage Vitals  Encounter Vitals Group     BP 10/30/23 0716 (!) 130/56     Systolic BP Percentile --      Diastolic BP Percentile --      Pulse Rate 10/30/23 0715 70     Resp 10/30/23 0715 18     Temp 10/30/23 0715 97.7 F (36.5 C)     Temp Source 10/30/23 0715 Oral     SpO2 10/30/23 0715 100 %     Weight 10/30/23 0716 145 lb (65.8 kg)     Height 10/30/23 0716 5\' 9"  (1.753 m)     Head Circumference --      Peak Flow --      Pain Score 10/30/23 0716 9     Pain Loc --      Pain Education --      Exclude from Growth Chart --      Most recent vital signs: Vitals:   10/30/23 0715 10/30/23 0716  BP:  (!) 130/56  Pulse: 70   Resp: 18   Temp: 97.7 F (36.5 C)   SpO2: 100%      General: Awake, no distress.  CV:  Good peripheral perfusion.  Resp:  Normal effort.  Abd:  No distention.  Other:  About a quarter sized ulceration noted on the medial aspect of the lower tibia without any surrounding redness or erythema.  No significant drainage at this time but according to patient there was a little bit of blood-tinged drainage from it.  Good distal pulses bilaterally.  Pitting edema bilaterally which is at baseline for patient.  Able to flex and extend the ankle.  No pain in the calf. No crepitus felt.    ED Results / Procedures / Treatments   Labs (all labs ordered are listed, but only abnormal results are displayed) Labs Reviewed  CBC WITH DIFFERENTIAL/PLATELET  COMPREHENSIVE METABOLIC PANEL WITH GFR  PROCEDURES:  Critical Care performed: No  Procedures   MEDICATIONS ORDERED IN ED: Medications - No data to display   IMPRESSION / MDM / ASSESSMENT AND PLAN / ED COURSE  I reviewed the triage vital signs and the nursing notes.   Patient's presentation is most consistent with acute presentation with potential threat to life or bodily function.   Differential is ulceration, cellulitis, no evidence of abscess on examination good distal pulses unlikely arterial issue.  No evidence of DVT on examination.  CBC shows stable hemoglobin.  White count is normal CMP shows slightly low potassium but patient reports being on potassium supplements.  Creatinine has been fluctuating but around baseline at 1.4.  LFTs are normal   Considered admission for the patient but she has no evidence of severe infection.  Her white count is normal, no fever.  Patient is ambulatory we discussed x-ray but I do not feel he would change management. She denies any new injuries.  Patient has a chronic ulcer with some  increasing pain and drainage but it sounds of the drainage is mostly blood tinge in nature.  After discussion with family out of abundance of caution they would like to be started on some antibiotics to help cover for strep, MRSA and they will follow-up at the wound care clinic. Declined bactrim so will do keflex/doxy.  We discussed ultrasound to evaluate for DVT but they deny any new swelling in the leg calf tenderness or other concerns to suggest DVT.  No prior history of DVT.  This seems most consistent with ulceration with possibly developing infection. No arterial issue.      FINAL CLINICAL IMPRESSION(S) / ED DIAGNOSES   Final diagnoses:  Skin ulcer with fat layer exposed (HCC)  Visit for wound check     Rx / DC Orders   ED Discharge Orders          Ordered    cephALEXin (KEFLEX) 500 MG capsule  2 times daily        10/30/23 0741    doxycycline (VIBRA-TABS) 100 MG tablet  2 times daily        10/30/23 0741    AMB referral to wound care center        10/30/23 0741             Note:  This document was prepared using Dragon voice recognition software and may include unintentional dictation errors.   Lubertha Rush, MD 10/30/23 [phone]

## 2023-10-30 NOTE — Discharge Instructions (Signed)
 We are starting you on some antibiotics out of abundance of caution in case this could be a developing infection given worsening pain and increase in size but the most important thing is to follow-up with the wound care to try to figure out how to help get this to heal and close fully.  If you develop fevers, worsening redness or any other concerns you should return to the ER for repeat evaluation otherwise follow-up with your primary care doctor.

## 2023-11-05 ENCOUNTER — Encounter: Attending: Physician Assistant | Admitting: Physician Assistant

## 2023-11-05 DIAGNOSIS — N183 Chronic kidney disease, stage 3 unspecified: Secondary | ICD-10-CM | POA: Diagnosis not present

## 2023-11-05 DIAGNOSIS — I129 Hypertensive chronic kidney disease with stage 1 through stage 4 chronic kidney disease, or unspecified chronic kidney disease: Secondary | ICD-10-CM | POA: Insufficient documentation

## 2023-11-05 DIAGNOSIS — S81011A Laceration without foreign body, right knee, initial encounter: Secondary | ICD-10-CM | POA: Insufficient documentation

## 2023-11-05 DIAGNOSIS — S81811A Laceration without foreign body, right lower leg, initial encounter: Secondary | ICD-10-CM | POA: Diagnosis not present

## 2023-11-05 DIAGNOSIS — S81812A Laceration without foreign body, left lower leg, initial encounter: Secondary | ICD-10-CM | POA: Diagnosis not present

## 2023-11-05 DIAGNOSIS — I480 Paroxysmal atrial fibrillation: Secondary | ICD-10-CM | POA: Diagnosis not present

## 2023-11-05 DIAGNOSIS — S71112A Laceration without foreign body, left thigh, initial encounter: Secondary | ICD-10-CM | POA: Diagnosis not present

## 2023-11-05 DIAGNOSIS — M316 Other giant cell arteritis: Secondary | ICD-10-CM | POA: Diagnosis not present

## 2023-11-05 DIAGNOSIS — Z950 Presence of cardiac pacemaker: Secondary | ICD-10-CM | POA: Diagnosis not present

## 2023-11-07 ENCOUNTER — Other Ambulatory Visit: Payer: Self-pay | Admitting: Physical Medicine & Rehabilitation

## 2023-11-07 ENCOUNTER — Ambulatory Visit
Admission: RE | Admit: 2023-11-07 | Discharge: 2023-11-07 | Disposition: A | Discharge status: Home / Self Care | Source: Ambulatory Visit | Attending: Physical Medicine & Rehabilitation | Admitting: Physical Medicine & Rehabilitation

## 2023-11-07 DIAGNOSIS — M5442 Lumbago with sciatica, left side: Secondary | ICD-10-CM

## 2023-11-07 DIAGNOSIS — G8929 Other chronic pain: Secondary | ICD-10-CM | POA: Insufficient documentation

## 2023-11-13 ENCOUNTER — Encounter: Admitting: Physician Assistant

## 2023-11-13 DIAGNOSIS — S81011A Laceration without foreign body, right knee, initial encounter: Secondary | ICD-10-CM | POA: Diagnosis not present

## 2023-11-22 ENCOUNTER — Encounter: Admitting: Physician Assistant

## 2023-11-22 DIAGNOSIS — S81011A Laceration without foreign body, right knee, initial encounter: Secondary | ICD-10-CM | POA: Diagnosis not present

## 2023-11-27 ENCOUNTER — Ambulatory Visit: Admitting: Physician Assistant

## 2023-12-03 ENCOUNTER — Encounter: Attending: Physician Assistant | Admitting: Physician Assistant

## 2023-12-03 DIAGNOSIS — I129 Hypertensive chronic kidney disease with stage 1 through stage 4 chronic kidney disease, or unspecified chronic kidney disease: Secondary | ICD-10-CM | POA: Insufficient documentation

## 2023-12-03 DIAGNOSIS — S81811A Laceration without foreign body, right lower leg, initial encounter: Secondary | ICD-10-CM | POA: Insufficient documentation

## 2023-12-03 DIAGNOSIS — X58XXXA Exposure to other specified factors, initial encounter: Secondary | ICD-10-CM | POA: Diagnosis not present

## 2023-12-03 DIAGNOSIS — S71112A Laceration without foreign body, left thigh, initial encounter: Secondary | ICD-10-CM | POA: Diagnosis not present

## 2023-12-03 DIAGNOSIS — N183 Chronic kidney disease, stage 3 unspecified: Secondary | ICD-10-CM | POA: Diagnosis not present

## 2023-12-03 DIAGNOSIS — M316 Other giant cell arteritis: Secondary | ICD-10-CM | POA: Diagnosis not present

## 2023-12-03 DIAGNOSIS — S81011A Laceration without foreign body, right knee, initial encounter: Secondary | ICD-10-CM | POA: Insufficient documentation

## 2023-12-03 DIAGNOSIS — I480 Paroxysmal atrial fibrillation: Secondary | ICD-10-CM | POA: Diagnosis not present

## 2023-12-03 DIAGNOSIS — Z950 Presence of cardiac pacemaker: Secondary | ICD-10-CM | POA: Diagnosis not present

## 2023-12-10 ENCOUNTER — Encounter

## 2023-12-10 DIAGNOSIS — S81011A Laceration without foreign body, right knee, initial encounter: Secondary | ICD-10-CM | POA: Diagnosis not present

## 2023-12-17 ENCOUNTER — Encounter: Admitting: Physician Assistant

## 2023-12-17 DIAGNOSIS — S81011A Laceration without foreign body, right knee, initial encounter: Secondary | ICD-10-CM | POA: Diagnosis not present

## 2023-12-30 ENCOUNTER — Encounter: Attending: Physician Assistant | Admitting: Physician Assistant

## 2023-12-30 DIAGNOSIS — S81811A Laceration without foreign body, right lower leg, initial encounter: Secondary | ICD-10-CM | POA: Insufficient documentation

## 2023-12-30 DIAGNOSIS — I480 Paroxysmal atrial fibrillation: Secondary | ICD-10-CM | POA: Insufficient documentation

## 2023-12-30 DIAGNOSIS — N183 Chronic kidney disease, stage 3 unspecified: Secondary | ICD-10-CM | POA: Diagnosis not present

## 2023-12-30 DIAGNOSIS — Z950 Presence of cardiac pacemaker: Secondary | ICD-10-CM | POA: Diagnosis not present

## 2023-12-30 DIAGNOSIS — M316 Other giant cell arteritis: Secondary | ICD-10-CM | POA: Diagnosis not present

## 2023-12-30 DIAGNOSIS — I129 Hypertensive chronic kidney disease with stage 1 through stage 4 chronic kidney disease, or unspecified chronic kidney disease: Secondary | ICD-10-CM | POA: Diagnosis not present

## 2023-12-30 DIAGNOSIS — S71112A Laceration without foreign body, left thigh, initial encounter: Secondary | ICD-10-CM | POA: Insufficient documentation

## 2023-12-30 DIAGNOSIS — S81011A Laceration without foreign body, right knee, initial encounter: Secondary | ICD-10-CM | POA: Insufficient documentation

## 2023-12-30 DIAGNOSIS — S81812A Laceration without foreign body, left lower leg, initial encounter: Secondary | ICD-10-CM | POA: Diagnosis not present

## 2023-12-30 DIAGNOSIS — X58XXXA Exposure to other specified factors, initial encounter: Secondary | ICD-10-CM | POA: Insufficient documentation

## 2023-12-31 ENCOUNTER — Other Ambulatory Visit: Payer: Self-pay | Admitting: Student in an Organized Health Care Education/Training Program

## 2023-12-31 ENCOUNTER — Ambulatory Visit (HOSPITAL_BASED_OUTPATIENT_CLINIC_OR_DEPARTMENT_OTHER): Admitting: Student in an Organized Health Care Education/Training Program

## 2023-12-31 ENCOUNTER — Ambulatory Visit
Admission: RE | Admit: 2023-12-31 | Discharge: 2023-12-31 | Disposition: A | Discharge status: Home / Self Care | Source: Ambulatory Visit | Attending: Student in an Organized Health Care Education/Training Program | Admitting: Student in an Organized Health Care Education/Training Program

## 2023-12-31 ENCOUNTER — Encounter: Payer: Self-pay | Admitting: Student in an Organized Health Care Education/Training Program

## 2023-12-31 VITALS — BP 156/97 | HR 70 | Temp 97.3°F | Resp 16 | Ht 69.0 in | Wt 140.0 lb

## 2023-12-31 DIAGNOSIS — M47816 Spondylosis without myelopathy or radiculopathy, lumbar region: Secondary | ICD-10-CM | POA: Insufficient documentation

## 2023-12-31 DIAGNOSIS — M5136 Other intervertebral disc degeneration, lumbar region with discogenic back pain only: Secondary | ICD-10-CM | POA: Insufficient documentation

## 2023-12-31 DIAGNOSIS — G894 Chronic pain syndrome: Secondary | ICD-10-CM | POA: Diagnosis present

## 2023-12-31 NOTE — Progress Notes (Signed)
 PROVIDER NOTE: Interpretation of information contained herein should be left to medically-trained personnel. Specific patient instructions are provided elsewhere under Patient Instructions section of medical record. This document was created in part using AI and STT-dictation technology, any transcriptional errors that may result from this process are unintentional.  Patient: Charlotte Glover  Service: E/M Encounter  Provider: Wallie Sherry, MD  DOB: Aug 11, 1927  Delivery: Face-to-face  Specialty: Interventional Pain Management  MRN: [id number]  Setting: Ambulatory outpatient facility  Specialty designation: 09  Type: New Patient  Location: Outpatient office facility  PCP: Fernande Ophelia JINNY DOUGLAS, MD  DOS: 12/31/2023    Referring Prov.: Dodson Delon FERNS, MD   Primary Reason(s) for Visit: Encounter for initial evaluation of one or more chronic problems (new to examiner) potentially causing chronic pain, and posing a threat to normal musculoskeletal function. (Level of risk: High) CC: Back Pain (lower)  HPI  Charlotte Glover is a 89 y.o. year old, female patient, who comes for the first time to our practice referred by Dodson Delon I, MD for our initial evaluation of her chronic pain. 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; H/O non-ST elevation myocardial infarction (NSTEMI); Headache; Hearing impairment; History of ductal carcinoma in situ (DCIS) of breast; History of melanoma; Hypertension; Long term current  use of systemic steroids; Lung nodule, multiple; Melanoma (HCC); Meningioma (HCC); Pedal edema; Primary osteoarthritis of right shoulder; Rotator cuff tear, right; Rotator cuff tendinitis, right; Screening for osteoporosis; Severe mitral regurgitation; Shingles; Sleep disorder; Intracranial carotid stenosis, right; Chronic headaches; Swelling of limb; Acquired thrombophilia (HCC); Enlarged pulmonary artery (HCC); Moderate aortic insufficiency; Prediabetes; Primary osteoarthritis of right knee; Total knee replacement status; Bruising; Right arm pain; Noninfected skin tear of right leg; Hematochezia; GCA (giant cell arteritis) (HCC); Rectal bleeding; GI bleeding; Paroxysmal atrial fibrillation (HCC); Peripheral neuropathy; Dyslipidemia; Hypokalemia; Hyponatremia; Stage 3b chronic kidney disease (CKD) (HCC); ABLA (acute blood loss anemia); Lower GI bleed; Lower GI bleeding; Degeneration of intervertebral disc of lumbar region with discogenic back pain; and Chronic pain syndrome on their problem list. Today she comes in for evaluation of her Back Pain (lower)  Pain Assessment: Location: Left Back Radiating: denies Onset: More than a month ago Duration: Chronic pain Quality: Aching, Discomfort, Sharp, Spasm Severity: 0-No pain (no pain now but hurts in the am)/10 (subjective, self-reported pain score)  Effect on ADL: chores, Hard to move Timing:   Modifying factors: rest, Tramadol , ice BP: (!) 156/97  HR: 70  Onset and Duration: Gradual Cause of pain: Arthritis Severity: Getting worse, NAS-11 at its worse: 10/10, NAS-11 at its best: 10/10, NAS-11 now: 0/10, and NAS-11 on the average: 7/10 Timing: Morning and After activity or exercise Aggravating Factors: Bending, Climbing, Motion, Prolonged sitting, and Stooping  Alleviating Factors: Cold packs, Lying down, and Resting Associated Problems: Fatigue and Spasms Quality of Pain: Aching, Annoying, Cramping, and Deep Previous Examinations or Tests:  X-rays and Orthopedic evaluation Previous Treatments: Relaxation therapy and Stretching exercises  Charlotte Glover is being evaluated for possible interventional pain management therapies for the treatment of her chronic pain.  Discussed the use of AI scribe software for clinical  note transcription with the patient, who gave verbal consent to proceed.  History of Present Illness   Charlotte Glover is a 89 year old female with significant cardiac history and lumbar spine issues who presents with chronic low back pain. She was referred by Dr. Dodson to the pain center for further management of her chronic low back pain.  She experiences chronic low back pain located just above her waist, to the left of her spine. The pain is described as a knife-like sensation and is focal, without radiation to her legs or buttocks.  She has received two lumbar epidural steroid injections injections in the past few months, which were not effective; the first provided no relief and the second exacerbated her pain. Attempts to adjust the injection site did not improve her condition.  She has a history of lumbar spine stenosis and arthritis, particularly at L4 and L5, confirmed by CT imaging. These conditions contribute significantly to her low back pain.  Her current medication regimen includes Tramadol , which is causing severe constipation. She is also on a low dose of Eliquis, which she will need to stop three days prior to any procedures.  She has a [employer] pacemaker and cannot undergo MRI due to a retained lead from a previous pacemaker replacement.       Meds   Current Outpatient Medications:    acetaminophen  (TYLENOL ) 500 MG tablet, Take 1,000 mg by mouth every 6 (six) hours as needed for pain or fever., Disp: , Rfl:    albuterol  (VENTOLIN  HFA) 108 (90 Base) MCG/ACT inhaler, Inhale 2 puffs into the lungs every 4 (four) hours as needed., Disp: 18 g, Rfl: 0   alendronate (FOSAMAX) 70 MG tablet,  Take 70 mg by mouth once a week., Disp: , Rfl:    esomeprazole (NEXIUM) 40 MG capsule, Take 40 mg by mouth daily., Disp: , Rfl:    furosemide  (LASIX ) 40 MG tablet, Take 40 mg by mouth 2 (two) times daily. , Disp: , Rfl: 3   gabapentin  (NEURONTIN ) 300 MG capsule, Take 300 mg by mouth 3 (three) times daily., Disp: , Rfl:    ipratropium (ATROVENT) 0.03 % nasal spray, Place into the nose., Disp: , Rfl:    isosorbide  mononitrate (IMDUR ) 30 MG 24 hr tablet, Take 30 mg by mouth daily., Disp: , Rfl:    KLOR-CON  M10 10 MEQ tablet, Take 2 tablets by mouth 2 (two) times daily., Disp: , Rfl:    metolazone  (ZAROXOLYN ) 5 MG tablet, Take 5 mg by mouth daily. Takes on Friday, Disp: , Rfl:    Multiple Vitamins-Minerals (PRESERVISION AREDS 2) CAPS, Take 1 capsule by mouth 2 (two) times daily., Disp: , Rfl:    nitroGLYCERIN  (NITROSTAT ) 0.4 MG SL tablet, Place 0.4 mg under the tongue every 5 (five) minutes x 2 doses as needed for chest pain. , Disp: , Rfl:    Polyvinyl Alcohol -Povidone (REFRESH OP), Apply 1 drop to eye daily as needed (dry eyes)., Disp: , Rfl:    predniSONE  (DELTASONE ) 10 MG tablet, Take 15 mg by mouth daily., Disp: , Rfl:    rosuvastatin  (CRESTOR ) 10 MG tablet, Take 10 mg by mouth daily., Disp: , Rfl:    senna (SENOKOT) 8.6 MG TABS tablet, Take 1 tablet by mouth daily as needed for mild constipation., Disp: , Rfl:    Spacer/Aero-Holding Chambers (AEROCHAMBER MV) inhaler, Use as instructed, Disp: 1 each, Rfl: 2   spironolactone  (ALDACTONE ) 25 MG tablet, Take 25 mg by mouth 2 (two) times  daily., Disp: , Rfl:    traMADol  (ULTRAM ) 50 MG tablet, Take by mouth., Disp: , Rfl:    triamcinolone ointment (KENALOG) 0.1 %, Apply 1 application  topically as needed., Disp: , Rfl:   Imaging Review  CT SHOULDER RIGHT W CONTRAST  Narrative CLINICAL DATA:  Chronic right shoulder pain. Rotator cuff tendinopathy. No known injury.  EXAM: CT ARTHROGRAPHY OF THE RIGHT SHOULDER  TECHNIQUE: Multidetector CT  imaging was performed following the standard protocol after injection of dilute contrast into the joint.  COMPARISON:  Image from contrast injection reviewed.  FINDINGS: The rotator cuff appears thickened consistent with tendinopathy. There is a partial width, full-thickness tear of the leading edge of the supraspinatus measuring approximately 0.7 cm from front to back with little to no retraction. There is no atrophy. The rotator cuff is otherwise intact.  Long head biceps tendon is intact. The humeral head is posteriorly subluxed on the glenoid suggestive of glenohumeral joint instability. The posterior labrum appears degenerated without focal tear. The patient has glenohumeral osteoarthritis with a small osteophyte off the humeral head and cartilage thinning. Bulky acromioclavicular degenerative change and a prominent subacromial spur also seen. Contrast extravasates into the subacromial/subdeltoid bursa.  IMPRESSION: Rotator cuff tendinopathy with a 0.7 cm from front to back full-thickness tear of the leading edge of the supraspinatus with minimal retraction. No atrophy.  Bulky acromioclavicular osteoarthritis and prominent subacromial spurring.  Moderate glenohumeral osteoarthritis.  Posterior subluxation of the humeral head on the glenoid suggestive of glenohumeral joint instability.   Electronically Signed By: Debby Prader M.D. On: 12/24/2016 12:58   Narrative CLINICAL DATA:  Subluxation/dislocation. The shoulder has since been relocated.  EXAM: LEFT SHOULDER - 2+ VIEW  COMPARISON:  None Available.  FINDINGS: The left shoulder is located. Progressive degenerative changes are present in the left shoulder. The left hemithorax is clear. The heart is enlarged. Pacing wires are stable.  IMPRESSION: 1. No acute abnormality. 2. Progressive degenerative changes of the left shoulder.   Electronically Signed By: Lonni Necessary M.D. On: 01/31/2023  15:10   CT THORACIC SPINE WO CONTRAST  Narrative CLINICAL DATA:  Chronic back pain  EXAM: CT THORACIC SPINE WITHOUT CONTRAST  TECHNIQUE: Multidetector CT images of the thoracic were obtained using the standard protocol without intravenous contrast.  RADIATION DOSE REDUCTION: This exam was performed according to the departmental dose-optimization program which includes automated exposure control, adjustment of the mA and/or kV according to patient size and/or use of iterative reconstruction technique.  COMPARISON:  None Available.  FINDINGS: Alignment: 2 mm anterolisthesis of C7 on T1. Levoscoliosis of the thoracic spine.  Vertebrae: No acute fracture or aggressive osseous lesion.  Paraspinal and other soft tissues: Negative.  Other: Thoracic aortic atherosclerosis. Cardiac pacemaker wires noted.  Disc levels:  Disc spaces: Mild disc height loss at C4-5 and C5-6. Moderate disc height loss C6-7 and C7-T1. moderate disc height loss T1-2. Mild disc height loss throughout the remainder of the thoracic spine.  T11-12: Mild disc bulge. Mild left foraminal stenosis. Mild bilateral facet arthropathy.  Moderate degenerative changes at the right T11 costovertebral junction.  IMPRESSION: 1. Mild-to-moderate thoracic spine spondylosis as described above. 2. No acute osseous injury of the thoracic spine. 3.  Aortic Atherosclerosis (ICD10-I70.0).   Electronically Signed By: Julaine Blanch M.D. On: 07/27/2023 08:22   MR LUMBAR SPINE WO CONTRAST  Narrative CLINICAL DATA:  89 year old female with chronic lumbar back pain, difficulty walking.  [employer] MRI compatible pacemaker patient who was monitored with the  assistance of [employer] personnel and no adverse features observed.  EXAM: MRI LUMBAR SPINE WITHOUT CONTRAST  TECHNIQUE: Multiplanar, multisequence MR imaging of the lumbar spine was performed. No intravenous contrast was administered.  COMPARISON:  Lumbar  MRI 11/08/2016.  FINDINGS: Segmentation: Lumbar segmentation appears to be normal and is the same numbering system used on the 2018 MRI.  Alignment: Stable lumbar lordosis with superimposed mild chronic retrolisthesis of L1 on L2, and mild to moderate dextroconvex lumbar spine curvature.  Vertebrae: The visible bone marrow signal is stable and within normal limits. No marrow edema or evidence of acute osseous abnormality. Intact visible sacrum and SI joints.  Conus medullaris and cauda equina: Conus extends to the L1-L2 level. No lower spinal cord or conus signal abnormality. Capacious spinal canal.  Paraspinal and other soft tissues: Stable and negative visible abdominal viscera. Negative visualized posterior paraspinal soft tissues. Sigmoid diverticulosis redemonstrated in the pelvis.  Disc levels:  T11-T12: Stable mild facet hypertrophy and minimal leftward disc bulge. No stenosis.  T12-L1: Chronic disc space loss with broad-based left eccentric disc osteophyte complex. Stable mild facet hypertrophy. Stable mild left T12 neural foraminal stenosis.  L1-L2: Chronic disc space loss and retrolisthesis with mild leftward disc osteophyte complex. Stable mild left L1 foraminal stenosis.  L2-L3: Chronic disc space loss and mild foraminal endplate spurring. Stable mild facet and ligament flavum hypertrophy. No stenosis.  L3-L4: Chronic mild disc bulge and endplate spurring. Chronic mild to moderate facet and ligament flavum hypertrophy. No stenosis.  L4-L5: Subtle anterolisthesis is stable. Mild disc bulging and moderate facet and ligament flavum hypertrophy appear stable without associated stenosis.  L5-S1: Chronic disc space loss with mild circumferential disc osteophyte complex. Stable mild right greater than left L5 foraminal stenosis.  IMPRESSION: 1. No acute osseous abnormality and stable MRI appearance of the lumbar spine since 2018. 2. Overall mild for age spine  degeneration superimposed on capacious spinal canal. No spinal stenosis or convincing neural impingement. Intermittent mild neural foraminal stenosis.   Electronically Signed By: VEAR Hurst M.D. On: 01/06/2018 17:17   MR Lumbar Spine W Wo Contrast  Narrative CLINICAL DATA:  Chronic bilateral low back pain without sciatica.  EXAM: MRI LUMBAR SPINE WITHOUT AND WITH CONTRAST  TECHNIQUE: Multiplanar and multiecho pulse sequences of the lumbar spine were obtained without and with intravenous contrast.  CONTRAST:  15mL MULTIHANCE  GADOBENATE DIMEGLUMINE  529 MG/ML IV SOLN  COMPARISON:  None.  FINDINGS: Segmentation:  Normal  Alignment: Mild lumbar scoliosis. Mild retrolisthesis L1-2. Remaining alignment normal.  Vertebrae:  Negative for fracture or mass.  Normal bone marrow.  Conus medullaris: Extends to the L2-3 level and appears normal.  Paraspinal and other soft tissues: Marked muscle atrophy. No retroperitoneal mass. Right renal cyst 2 cm.  Disc levels:  T12-L1: Disc degeneration and disc bulging without stenosis  L1-2: Mild retrolisthesis. Mild spurring on the left causing mild left foraminal narrowing. Spinal canal widely patent  L2-3: Mild disc degeneration and mild endplate spurring without stenosis  L3-4: Mild disc degeneration and mild facet degeneration without stenosis  L4-5:  Mild disc and facet degeneration without stenosis  L5-S1: Moderate disc degeneration with mild spurring. No significant spinal or foraminal stenosis.  IMPRESSION: Multilevel degenerative changes lumbar spine. No focal disc protrusion. No significant central canal stenosis. Mild left foraminal narrowing L1-2 due to spurring.   Electronically Signed By: Carlin Gaskins M.D. On: 11/08/2016 15:09   CT LUMBAR SPINE WO CONTRAST  Narrative CLINICAL DATA:  Chronic left low back pain  and left sciatica  EXAM: CT LUMBAR SPINE WITHOUT CONTRAST  TECHNIQUE: Multidetector CT imaging  of the lumbar spine was performed without intravenous contrast administration. Multiplanar CT image reconstructions were also generated.  RADIATION DOSE REDUCTION: This exam was performed according to the departmental dose-optimization program which includes automated exposure control, adjustment of the mA and/or kV according to patient size and/or use of iterative reconstruction technique.  COMPARISON:  05/02/2023  FINDINGS: Segmentation: The lowest lumbar type non-rib-bearing vertebra is labeled as L5.  Alignment: Dextroconvex thoracolumbar and levoconvex lower lumbar scoliosis with rotary component. 3 mm degenerative retrolisthesis at T12-L1 and L1-2.  Vertebrae: Loss of disc height at all lumbar levels with especially L1-2, L2-3, and L5-S1 with vacuum disc phenomenon. Stable Schmorl's nodes at multiple levels. Mild endplate sclerosis eccentric to the left at T12-L1 as before. No compelling indicators discitis-osteomyelitis. No fracture or acute bony findings identified.  Paraspinal and other soft tissues: Hiatal hernia. Old granulomatous disease. Atherosclerosis is present, including aortoiliac atherosclerotic disease. Cholecystectomy. Simple right kidney lower pole cyst warrants no imaging workup. Colonic diverticulosis.  Calcified central mesenteric lymph node/mass measuring up to 1.5 by 1.1 cm, mildly abnormally enlarged, with increased calcification compared to the prior exam. Faint surrounding mesenteric edema but no cicatricial reaction.  Paraspinal muscular atrophy.  Disc levels:  T12-L1: Unremarkable  L1-2: Unremarkable  L2-3: Unremarkable  L3-4: Unremarkable  L4-5: No impingement.  Suspected disc bulge.  L5-S1: Borderline right foraminal stenosis due to facet and intervertebral spurring.  IMPRESSION: 1. Lumbar spondylosis and degenerative disc disease, causing borderline right foraminal stenosis at L5-S1. 2. Dextroconvex thoracolumbar and  levoconvex lower lumbar scoliosis with rotary component. 3. Hiatal hernia. 4. Colonic diverticulosis. 5. Calcified central mesenteric lymph node/mass measuring up to 1.5 by 1.1 cm, mildly abnormally enlarged, with increased calcification compared to the 05/02/2023 exam, and not present on 01/14/2018. Faint surrounding mesenteric edema but no cicatricial reaction. Differential diagnostic considerations include calcifying chronic sclerosing mesenteritis (favored) and carcinoid tumor. 6.  Aortic Atherosclerosis (ICD10-I70.0).   Electronically Signed By: Ryan Salvage M.D. On: 11/07/2023 17:57   DG Foot Complete Left  Narrative CLINICAL DATA:  Lateral distal pain and swelling.  EXAM: LEFT FOOT - COMPLETE 3+ VIEW  COMPARISON:  None.  FINDINGS: Subluxation at the DIP joint of the left little toe. No fracture visualized. Soft tissue swelling noted along the dorsum of the foot. Plantar calcaneal spur.  IMPRESSION: Subluxation of the DIP joint of the left little toe.   Electronically Signed By: Franky Crease M.D. On: 04/04/2016 16:43     Complexity Note: Imaging results reviewed.                         ROS  Cardiovascular: Heart trouble, Heart attack ( Date: >5 yrs ago), Heart surgery, Pacemaker or defibrillator, and Blood thinners:  Anticoagulant Pulmonary or Respiratory: Lung problems and Snoring  Neurological: Curved spine Psychological-Psychiatric: No reported psychological or psychiatric signs or symptoms such as difficulty sleeping, anxiety, depression, delusions or hallucinations (schizophrenial), mood swings (bipolar disorders) or suicidal ideations or attempts Gastrointestinal: No reported gastrointestinal signs or symptoms such as vomiting or evacuating blood, reflux, heartburn, alternating episodes of diarrhea and constipation, inflamed or scarred liver, or pancreas or irrregular and/or infrequent bowel movements Genitourinary: Kidney  disease Hematological: Weakness due to low blood hemoglobin or red blood cell count (Anemia), Brusing easily, Bleeding easily, and Positive for taking the following blood thinner: Eliquis Endocrine: No reported endocrine signs or symptoms such  as high or low blood sugar, rapid heart rate due to high thyroid levels, obesity or weight gain due to slow thyroid or thyroid disease Rheumatologic: Rheumatoid arthritis and Constant unexplained fatigue (Chronic Fatigue Syndrome) Musculoskeletal: Negative for myasthenia gravis, muscular dystrophy, multiple sclerosis or malignant hyperthermia Work History: Retired  Allergies  Ms. Durand is allergic to amiodarone, baclofen, metoprolol , pantoprazole , silicone, and tape.  Laboratory Chemistry Profile   Renal Lab Results  Component Value Date   BUN 27 (H) 10/30/2023   CREATININE 1.41 (H) 10/30/2023   GFRAA 50 (L) 04/08/2019   GFRNONAA 35 (L) 10/30/2023   PROTEINUR NEGATIVE 12/13/2021     Electrolytes Lab Results  Component Value Date   NA 134 (L) 10/30/2023   K 3.1 (L) 10/30/2023   CL 92 (L) 10/30/2023   CALCIUM  9.1 10/30/2023   MG 2.3 11/25/2018     Hepatic Lab Results  Component Value Date   AST 22 10/30/2023   ALT 15 10/30/2023   ALBUMIN 3.6 10/30/2023   ALKPHOS 28 (L) 10/30/2023     ID Lab Results  Component Value Date   SARSCOV2NAA NEGATIVE 04/07/2019   STAPHAUREUS NEGATIVE 12/13/2021   MRSAPCR NEGATIVE 12/13/2021     Bone No results found for: VD25OH, VD125OH2TOT, CI6874NY7, CI7874NY7, 25OHVITD1, 25OHVITD2, 25OHVITD3, TESTOFREE, TESTOSTERONE   Endocrine Lab Results  Component Value Date   GLUCOSE 119 (H) 10/30/2023   GLUCOSEU NEGATIVE 12/13/2021   TSH 1.04 05/30/2013     Neuropathy No results found for: VITAMINB12, FOLATE, HGBA1C, HIV   CNS No results found for: COLORCSF, APPEARCSF, RBCCOUNTCSF, WBCCSF, POLYSCSF, LYMPHSCSF, EOSCSF, PROTEINCSF, GLUCCSF, JCVIRUS, CSFOLI,  IGGCSF, LABACHR, ACETBL   Inflammation (CRP: Acute  ESR: Chronic) Lab Results  Component Value Date   CRP 1.9 (H) 12/13/2021   ESRSEDRATE 46 (H) 12/13/2021     Rheumatology No results found for: RF, ANA, LABURIC, URICUR, LYMEIGGIGMAB, LYMEABIGMQN, HLAB27   Coagulation Lab Results  Component Value Date   INR 1.7 (H) 09/09/2023   LABPROT 20.3 (H) 09/09/2023   APTT 31 09/09/2023   PLT 256 10/30/2023     Cardiovascular Lab Results  Component Value Date   BNP 55.0 11/25/2018   CKTOTAL 57 11/24/2011   CKMB 2.1 05/28/2014   TROPONINI <0.03 11/25/2018   HGB 9.0 (L) 10/30/2023   HCT 30.6 (L) 10/30/2023     Screening Lab Results  Component Value Date   SARSCOV2NAA NEGATIVE 04/07/2019   STAPHAUREUS NEGATIVE 12/13/2021   MRSAPCR NEGATIVE 12/13/2021     Cancer No results found for: CEA, CA125, LABCA2   Allergens No results found for: ALMOND, APPLE, ASPARAGUS, AVOCADO, BANANA, BARLEY, BASIL, BAYLEAF, GREENBEAN, LIMABEAN, WHITEBEAN, BEEFIGE, REDBEET, BLUEBERRY, BROCCOLI, CABBAGE, MELON, CARROT, CASEIN, CASHEWNUT, CAULIFLOWER, CELERY     Note: Lab results reviewed.  PFSH  Drug: Charlotte Glover  reports no history of drug use. Alcohol :  reports current alcohol  use of about 7.0 standard drinks of alcohol  per week. Tobacco:  reports that she has never smoked. She has never used smokeless tobacco. Medical:  has a past medical history of (HFpEF) heart failure with preserved ejection fraction (HCC) (06/21/2020), Amaurosis fugax of left eye, Arthritis, Atrial fibrillation (HCC), Cardiac arrest (HCC) (10/2009), Cardiac murmur, CHB (complete heart block) (HCC), Chickenpox, CKD (chronic kidney disease), stage III (HCC), Coronary artery disease, Ductal carcinoma in situ (DCIS) of right breast (07/13/2013), Dyslipidemia (09/15/2023), Dyspnea, Edema, GERD (gastroesophageal reflux disease), Granulomatous disease (HCC), Hiatal hernia,  HLD (hyperlipidemia), HOH (hard of hearing), Hypertension (01/23/2012), Jackhammer esophagus, Long term current use  of antithrombotics/antiplatelets, Melanoma of lower leg, left (HCC), Meningioma, multiple (HCC) (07/14/2019), NSTEMI (non-ST elevated myocardial infarction) (HCC) (08/2009), Presence of permanent cardiac pacemaker, Pulmonary HTN (HCC) (05/08/2016), S/P ablation of atrial fibrillation, Shingles, SSS (sick sinus syndrome) (HCC), Valvular heart disease (02/08/2012), and Wears hearing aid in both ears. Family: family history includes Breast cancer in her daughter and mother; Cancer in her brother, daughter, sister, and son.  Past Surgical History:  Procedure Laterality Date   ANTERIOR VITRECTOMY Left 03/20/2017   Procedure: ANTERIOR VITRECTOMY;  Surgeon: Mittie Gaskin, MD;  Location: [facility];  Service: Ophthalmology;  Laterality: Left;  IVA TOPICAL LEFT   ARTERY BIOPSY Right 04/08/2019   Procedure: BIOPSY TEMPORAL ARTERY;  Surgeon: Marea Selinda RAMAN, MD;  Location: [facility];  Service: Vascular;  Laterality: Right;   ATRIAL FIBRILLATION ABLATION N/A 03/24/2012   Procedure: ATRIAL FIBRILLATION ABLATION (PVI); Location: [facility]   ATRIAL FIBRILLATION ABLATION N/A 01/02/2013   Procedure: ATRIAL FIBRILLATION ABLATION (PVI, roof line, coronary sinus siolation, CFAE ablation); Location: [facility]   AV NODE ABLATION N/A 05/26/2014   Procedure: AV NODE ABLATION; Location: [facility]   BIV PACEMAKER INSERTION CRT-P N/A 07/13/2016   Procedure: CRT-P BiV PACEMAKER UPGRADE; Location: [facility]   BREAST BIOPSY Right 2015   + for DCIS    BREAST SURGERY     CARDIAC CATHETERIZATION Bilateral 05/08/2016   Procedure: Right/Left Heart Cath and Coronary Angiography;  Surgeon: Marsa Dooms, MD;  Location: [facility];  Service: Cardiovascular;  Laterality: Bilateral;   CARDIOVERSION N/A 04/02/2012   Procedure: CARDIOVERSION; Location: [facility]   CARDIOVERSION N/A 01/15/2013   Procedure:  CARDIOVERSION; Location: [facility]   CATARACT EXTRACTION W/PHACO Left 03/20/2017   Procedure: IOL EXCHANGE;  Surgeon: Mittie Gaskin, MD;  Location: [facility];  Service: Ophthalmology;  Laterality: Left;   CHOLECYSTECTOMY     COLONOSCOPY     patient reports several   COLONOSCOPY N/A 09/11/2023   Procedure: COLONOSCOPY;  Surgeon: Jinny Carmine, MD;  Location: [facility];  Service: Endoscopy;  Laterality: N/A;   DILATION AND CURETTAGE OF UTERUS     ESOPHAGOGASTRODUODENOSCOPY     ESOPHAGOGASTRODUODENOSCOPY (EGD) WITH PROPOFOL  N/A 12/13/2020   Procedure: ESOPHAGOGASTRODUODENOSCOPY (EGD) WITH PROPOFOL ;  Surgeon: Maryruth Ole DASEN, MD;  Location: [facility];  Service: Endoscopy;  Laterality: N/A;   KNEE ARTHROPLASTY Right 12/25/2021   Procedure: COMPUTER ASSISTED TOTAL KNEE ARTHROPLASTY;  Surgeon: Mardee Lynwood SQUIBB, MD;  Location: [facility];  Service: Orthopedics;  Laterality: Right;   PACEMAKER INSERTION N/A 05/28/2014   Procedure: PACEMAKER INSERTION (dual chamber); Location: [facility]   PERIPHERAL IRIDOTOMY Left 03/20/2017   Procedure: PERIPHERAL IRIDECTOMY;  Surgeon: Mittie Gaskin, MD;  Location: [facility];  Service: Ophthalmology;  Laterality: Left;   ROTATOR CUFF REPAIR Left    TEE WITHOUT CARDIOVERSION N/A 05/16/2016   Procedure: TRANSESOPHAGEAL ECHOCARDIOGRAM (TEE);  Surgeon: Vinie DELENA Jude, MD;  Location: [facility];  Service: Cardiovascular;  Laterality: N/A;   TONSILLECTOMY     uterine polyp     Active Ambulatory Problems    Diagnosis Date Noted   Atrial fibrillation (HCC) 11/27/2011   Coronary artery disease 11/27/2011   Biventricular cardiac pacemaker in situ 05/18/2014   Temporal arteritis (HCC) 04/07/2019   Amnesia, global, transient 04/05/2015   CHB (complete heart block) (HCC) 06/30/2016   Chronic daily headache 11/20/2018   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 04/12/2017   CKD (chronic kidney disease) stage 3, GFR 30-59 ml/min (HCC)  12/27/2017   DCIS (ductal carcinoma in situ) 08/11/2013  Diffuse pain 04/06/2014   Edema 01/23/2012   Elevated erythrocyte sedimentation rate 11/05/2018   Fatigue 03/16/2014   S/P ablation of atrial fibrillation 07/11/2012   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 06/30/2014   H/O non-ST elevation myocardial infarction (NSTEMI) 01/01/2013   Headache 03/16/2014   Hearing impairment 01/01/2013   History of ductal carcinoma in situ (DCIS) of breast 08/11/2013   History of melanoma 11/24/2013   Hypertension 01/23/2012   Long term current use of systemic steroids 05/04/2019   Lung nodule, multiple 12/28/2015   Melanoma (HCC) 11/24/2013   Meningioma (HCC) 12/29/2018   Pedal edema 04/23/2016   Primary osteoarthritis of right shoulder 12/07/2016   Rotator cuff tear, right 12/07/2016   Rotator cuff tendinitis, right 12/07/2016   Screening for osteoporosis 05/04/2019   Severe mitral regurgitation 05/29/2016   Shingles 05/06/2017   Sleep disorder 05/06/2014   Intracranial carotid stenosis, right 05/29/2019   Chronic headaches 05/29/2019   Swelling of limb 11/01/2020   Acquired thrombophilia (HCC) 08/07/2021   Enlarged pulmonary artery (HCC) 05/23/2020   Moderate aortic insufficiency 03/07/2020   Prediabetes 12/14/2021   Primary osteoarthritis of right knee 04/09/2021   Total knee replacement status 12/25/2021   Bruising 05/28/2023   Right arm pain 05/28/2023   Noninfected skin tear of right leg 05/28/2023   Hematochezia 09/10/2023   GCA (giant cell arteritis) (HCC) 02/15/2023   Rectal bleeding 09/11/2023   GI bleeding 09/15/2023   Paroxysmal atrial fibrillation (HCC) 09/15/2023   Peripheral neuropathy 09/15/2023   Dyslipidemia 09/15/2023   Hypokalemia 09/15/2023   Hyponatremia 09/15/2023   Stage 3b chronic kidney disease (CKD) (HCC) 09/15/2023   ABLA (acute blood loss anemia) 09/15/2023   Lower GI bleed 09/16/2023   Lower GI bleeding  09/16/2023   Degeneration of intervertebral disc of lumbar region with discogenic back pain 12/31/2023   Chronic pain syndrome 12/31/2023   Resolved Ambulatory Problems    Diagnosis Date Noted   No Resolved Ambulatory Problems   Past Medical History:  Diagnosis Date   (HFpEF) heart failure with preserved ejection fraction (HCC) 06/21/2020   Amaurosis fugax of left eye    Arthritis    Cardiac arrest (HCC) 10/2009   Cardiac murmur    Chickenpox    CKD (chronic kidney disease), stage III (HCC)    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/13/2013   Dyspnea    GERD (gastroesophageal reflux disease)    Granulomatous disease (HCC)    Hiatal hernia    HLD (hyperlipidemia)    HOH (hard of hearing)    Jackhammer esophagus    Long term current use of antithrombotics/antiplatelets    Melanoma of lower leg, left (HCC)    Meningioma, multiple (HCC) 07/14/2019   NSTEMI (non-ST elevated myocardial infarction) (HCC) 08/2009   Presence of permanent cardiac pacemaker    Pulmonary HTN (HCC) 05/08/2016   SSS (sick sinus syndrome) (HCC)    Valvular heart disease 02/08/2012   Wears hearing aid in both ears    Constitutional Exam  General appearance: Well nourished, well developed, and well hydrated. In no apparent acute distress Vitals:   12/31/23 1338  BP: (!) 156/97  Pulse: 70  Resp: 16  Temp: (!) 97.3 F (36.3 C)  SpO2: 99%  Weight: 140 lb (63.5 kg)  Height: 5' 9 (1.753 m)   BMI Assessment: Estimated body mass index is 20.67 kg/m as calculated from the following:   Height as of this encounter: 5' 9 (1.753  m).   Weight as of this encounter: 140 lb (63.5 kg).  BMI interpretation table: BMI level Category Range association with higher incidence of chronic pain  <18 kg/m2 Underweight   18.5-24.9 kg/m2 Ideal body weight   25-29.9 kg/m2 Overweight Increased incidence by 20%  30-34.9 kg/m2 Obese (Class I) Increased incidence by 68%  35-39.9 kg/m2 Severe obesity (Class II)  Increased incidence by 136%  >40 kg/m2 Extreme obesity (Class III) Increased incidence by 254%   Patient's current BMI Ideal Body weight  Body mass index is 20.67 kg/m. Ideal body weight: 66.2 kg (145 lb 15.1 oz)   BMI Readings from Last 4 Encounters:  12/31/23 20.67 kg/m  10/30/23 21.41 kg/m  09/15/23 22.17 kg/m  09/09/23 21.41 kg/m   Wt Readings from Last 4 Encounters:  12/31/23 140 lb (63.5 kg)  10/30/23 145 lb (65.8 kg)  09/15/23 150 lb 2.1 oz (68.1 kg)  09/09/23 145 lb (65.8 kg)    Psych/Mental status: Alert, oriented x 3 (person, place, & time)       Eyes: PERLA Respiratory: No evidence of acute respiratory distress Lumbar Spine Area Exam  Skin & Axial Inspection: Thoraco-lumbar Scoliosis Alignment: Asymmetric Functional ROM: Pain restricted ROM       Stability: No instability detected Muscle Tone/Strength: Functionally intact. No obvious neuro-muscular anomalies detected. Sensory (Neurological): Musculoskeletal pain pattern Palpation: No palpable anomalies       Provocative Tests: Hyperextension/rotation test: (+) bilaterally for facet joint pain. Lumbar quadrant test (Kemp's test): (+) bilaterally for facet joint pain.  Gait & Posture Assessment  Ambulation: Patient came in today in a wheel chair Gait: Antalgic Posture: Difficulty standing up straight, due to pain  Lower Extremity Exam    Side: Right lower extremity  Side: Left lower extremity  Stability: No instability observed          Stability: No instability observed          Skin & Extremity Inspection: Skin color, temperature, and hair growth are WNL. No peripheral edema or cyanosis. No masses, redness, swelling, asymmetry, or associated skin lesions. No contractures.  Skin & Extremity Inspection: Skin color, temperature, and hair growth are WNL. No peripheral edema or cyanosis. No masses, redness, swelling, asymmetry, or associated skin lesions. No contractures.  Functional ROM: Unrestricted ROM                   Functional ROM: Unrestricted ROM                  Muscle Tone/Strength: Functionally intact. No obvious neuro-muscular anomalies detected.  Muscle Tone/Strength: Functionally intact. No obvious neuro-muscular anomalies detected.  Sensory (Neurological): Unimpaired        Sensory (Neurological): Unimpaired        DTR: Patellar: deferred today Achilles: deferred today Plantar: deferred today  DTR: Patellar: deferred today Achilles: deferred today Plantar: deferred today  Palpation: No palpable anomalies  Palpation: No palpable anomalies    Assessment  Primary Diagnosis & Pertinent Problem List: The primary encounter diagnosis was Lumbar facet arthropathy. Diagnoses of Lumbar spondylosis, Degeneration of intervertebral disc of lumbar region with discogenic back pain, and Chronic pain syndrome were also pertinent to this visit.  Visit Diagnosis (New problems to examiner): 1. Lumbar facet arthropathy   2. Lumbar spondylosis   3. Degeneration of intervertebral disc of lumbar region with discogenic back pain   4. Chronic pain syndrome    Plan of Care (Initial workup plan)  Charlotte Glover has a history of  greater than 3 months of moderate to severe pain which is resulted in functional impairment.  The patient has tried various conservative therapeutic options such as NSAIDs, Tylenol , muscle relaxants, physical therapy which was inadequately effective.  Patient's pain is predominantly axial with physical exam and L-MRI findings suggestive of facet arthropathy. Lumbar facet medial branch nerve blocks were discussed with the patient.  Risks and benefits were reviewed.  Patient would like to proceed with bilateral L3, L4, L5 medial branch nerve block.  Patient will need to stop Eliquis 3 days prior.  Okay to take aspirin 81 mg instead.  We also discussed Sprint PNS, I explained to pt how Sprint peripheral nerve stimulation (PNS)  is typically considered for patients with chronic,  localized pain that is not responding to conservative treatments such as medications, physical therapy, or injections.  The SPRINT peripheral nerve stimulator is designed for short-term, percutaneous use (approximately 60 days) to modulate pain through targeted nerve stimulation. Unlike traditional permanent implants, SPRINT is temporary but can lead to long-lasting pain relief by altering pain signals.  We discussed the risks and benefits of peripheral nerve stimulation. Benefits: minimally invasive, does not require permanent implantation, can offer significant pain relief, improving function and quality of life, may reduce the need for long-term opioid use. The risks/challenges include (but not limited to):  infection or irritation at the stimulation site, discomfort from electrode placement, risk of incomplete pain relief or temporary relief post-removal, limited to short-term therapy, which may be a disadvantage in chronic, refractory cases  I also discussed medication management with her and her daughter.  We will need a baseline urine toxicology screen and I can take her on for medication management at her next visit.  She has been on low-dose tramadol .  This does result in constipation so I expressed my concern about escalating her opioid regimen which could potentially worsen her constipation.    Lab Orders         Compliance Drug Analysis, Ur      Procedure Orders         LUMBAR FACET(MEDIAL BRANCH NERVE BLOCK) MBNB      Provider-requested follow-up: Return in about 22 days (around 01/22/2024) for B/L L3,4,5 MBNB #1 , NS, stop Eliquis 3 days prior.  Future Appointments  Date Time Provider Department Center  01/06/2024 10:30 AM Bethena Ferraris Eday III, PA-C [facility] None   I discussed the assessment and treatment plan with the patient. The patient was provided an opportunity to ask questions and all were answered. The patient agreed with the plan and demonstrated an understanding of the  instructions.  Patient advised to call back or seek an in-person evaluation if the symptoms or condition worsens.  Duration of encounter: .  Total time on encounter, as per AMA guidelines included both the face-to-face and non-face-to-face time personally spent by the physician and/or other qualified health care professional(s) on the day of the encounter (includes time in activities that require the physician or other qualified health care professional and does not include time in activities normally performed by clinical staff). Physician's time may include the following activities when performed: Preparing to see the patient (e.g., pre-charting review of records, searching for previously ordered imaging, lab work, and nerve conduction tests) Review of prior analgesic pharmacotherapies. Reviewing PMP Interpreting ordered tests (e.g., lab work, imaging, nerve conduction tests) Performing post-procedure evaluations, including interpretation of diagnostic procedures Obtaining and/or reviewing separately obtained history Performing a medically appropriate examination and/or evaluation Counseling and educating the  patient/family/caregiver Ordering medications, tests, or procedures Referring and communicating with other health care professionals (when not separately reported) Documenting clinical information in the electronic or other health record Independently interpreting results (not separately reported) and communicating results to the patient/ family/caregiver Care coordination (not separately reported)  Note by: Wallie Sherry, MD (TTS and AI technology used. I apologize for any typographical errors that were not detected and corrected.) Date: 12/31/2023; Time: 3:31 PM

## 2023-12-31 NOTE — Progress Notes (Signed)
 Safety precautions to be maintained throughout the outpatient stay will include: orient to surroundings, keep bed in low position, maintain call bell within reach at all times, provide assistance with transfer out of bed and ambulation.

## 2024-01-02 ENCOUNTER — Telehealth: Payer: Self-pay

## 2024-01-02 NOTE — Telephone Encounter (Signed)
 Telephone call from patient stating that her pain is unbearable. She reports that she had to take 3 Tramadol  yesterday. She is asking if there is anything that can be done? Per Dr. Andrey note he is waiting on the UDS to come back, patient also asking about injections. Informed patient that provider was out of the office today, and she says she may go to the ER.

## 2024-01-06 ENCOUNTER — Ambulatory Visit: Admitting: Physician Assistant

## 2024-01-06 DIAGNOSIS — S81011A Laceration without foreign body, right knee, initial encounter: Secondary | ICD-10-CM | POA: Diagnosis not present

## 2024-01-06 NOTE — Telephone Encounter (Signed)
 Patient calling to see about approval for injections. Informed patient that once approved we would call to get it scheduled.

## 2024-01-07 LAB — COMPLIANCE DRUG ANALYSIS, UR

## 2024-01-13 ENCOUNTER — Encounter: Admitting: Physician Assistant

## 2024-01-13 DIAGNOSIS — S81011A Laceration without foreign body, right knee, initial encounter: Secondary | ICD-10-CM | POA: Diagnosis not present

## 2024-01-14 ENCOUNTER — Ambulatory Visit: Admitting: Physician Assistant

## 2024-01-20 ENCOUNTER — Encounter: Admitting: Physician Assistant

## 2024-01-20 DIAGNOSIS — S81011A Laceration without foreign body, right knee, initial encounter: Secondary | ICD-10-CM | POA: Diagnosis not present

## 2024-01-21 ENCOUNTER — Ambulatory Visit: Admitting: Physician Assistant

## 2024-01-22 ENCOUNTER — Ambulatory Visit (HOSPITAL_BASED_OUTPATIENT_CLINIC_OR_DEPARTMENT_OTHER): Admitting: Student in an Organized Health Care Education/Training Program

## 2024-01-22 ENCOUNTER — Encounter: Payer: Self-pay | Admitting: Student in an Organized Health Care Education/Training Program

## 2024-01-22 ENCOUNTER — Ambulatory Visit
Admission: RE | Admit: 2024-01-22 | Discharge: 2024-01-22 | Disposition: A | Discharge status: Home / Self Care | Source: Ambulatory Visit | Attending: Student in an Organized Health Care Education/Training Program | Admitting: Student in an Organized Health Care Education/Training Program

## 2024-01-22 ENCOUNTER — Other Ambulatory Visit: Payer: Self-pay | Admitting: Student in an Organized Health Care Education/Training Program

## 2024-01-22 DIAGNOSIS — M47816 Spondylosis without myelopathy or radiculopathy, lumbar region: Secondary | ICD-10-CM | POA: Diagnosis present

## 2024-01-22 DIAGNOSIS — G894 Chronic pain syndrome: Secondary | ICD-10-CM | POA: Diagnosis present

## 2024-01-22 MED ORDER — ROPIVACAINE HCL 2 MG/ML IJ SOLN
INTRAMUSCULAR | Status: AC
Start: 2024-01-22 — End: 2024-01-22
  Filled 2024-01-22: qty 20

## 2024-01-22 MED ORDER — LIDOCAINE HCL 2 % IJ SOLN
INTRAMUSCULAR | Status: AC
  Filled 2024-01-22: qty 20

## 2024-01-22 MED ORDER — TRAMADOL HCL 50 MG PO TABS: 50.0000 mg | ORAL_TABLET | Freq: Two times a day (BID) | ORAL | 0 refills | Status: DC | PRN

## 2024-01-22 MED ORDER — DEXAMETHASONE SODIUM PHOSPHATE 10 MG/ML IJ SOLN
INTRAMUSCULAR | Status: AC
  Filled 2024-01-22: qty 2

## 2024-01-22 MED ORDER — LIDOCAINE HCL 2 % IJ SOLN
20.0000 mL | Freq: Once | INTRAMUSCULAR | Status: AC
  Administered 2024-01-22: 400 mg

## 2024-01-22 MED ORDER — ROPIVACAINE HCL 2 MG/ML IJ SOLN
18.0000 mL | Freq: Once | INTRAMUSCULAR | Status: AC
  Administered 2024-01-22: 18 mL via PERINEURAL

## 2024-01-22 MED ORDER — DEXAMETHASONE SODIUM PHOSPHATE 10 MG/ML IJ SOLN
20.0000 mg | Freq: Once | INTRAMUSCULAR | Status: AC
  Administered 2024-01-22: 20 mg

## 2024-01-22 NOTE — Patient Instructions (Signed)
 Pain Management Discharge Instructions  General Discharge Instructions :  If you need to reach your doctor call: Monday-Friday 8:00 am - 4:00 pm at [phone] or toll free [phone].  After clinic hours [phone] to have operator reach doctor.  Bring all of your medication bottles to all your appointments in the pain clinic.  To cancel or reschedule your appointment with Pain Management please remember to call 24 hours in advance to avoid a fee.  Refer to the educational materials which you have been given on: General Risks, I had my Procedure. Discharge Instructions, Post Sedation.  Post Procedure Instructions:  The drugs you were given will stay in your system until tomorrow, so for the next 24 hours you should not drive, make any legal decisions or drink any alcoholic beverages.  You may eat anything you prefer, but it is better to start with liquids then soups and crackers, and gradually work up to solid foods.  Please notify your doctor immediately if you have any unusual bleeding, trouble breathing or pain that is not related to your normal pain.  Depending on the type of procedure that was done, some parts of your body may feel week and/or numb.  This usually clears up by tonight or the next day.  Walk with the use of an assistive device or accompanied by an adult for the 24 hours.  You may use ice on the affected area for the first 24 hours.  Put ice in a Ziploc bag and cover with a towel and place against area 15 minutes on 15 minutes off.  You may switch to heat after 24 hours.Facet Blocks Patient Information  Description: The facets are joints in the spine between the vertebrae.  Like any joints in the body, facets can become irritated and painful.  Arthritis can also effect the facets.  By injecting steroids and local anesthetic in and around these joints, we can temporarily block the nerve supply to them.  Steroids act directly on irritated nerves and tissues to  reduce selling and inflammation which often leads to decreased pain.  Facet blocks may be done anywhere along the spine from the neck to the low back depending upon the location of your pain.   After numbing the skin with local anesthetic (like Novocaine), a small needle is passed onto the facet joints under x-ray guidance.  You may experience a sensation of pressure while this is being done.  The entire block usually lasts about 15-25 minutes.   Conditions which may be treated by facet blocks:  Low back/buttock pain Neck/shoulder pain Certain types of headaches  Preparation for the injection:  Do not eat any solid food or dairy products within 8 hours of your appointment. You may drink clear liquid up to 3 hours before appointment.  Clear liquids include water, black coffee, juice or soda.  No milk or cream please. You may take your regular medication, including pain medications, with a sip of water before your appointment.  Diabetics should hold regular insulin (if taken separately) and take 1/2 normal NPH dose the morning of the procedure.  Carry some sugar containing items with you to your appointment. A driver must accompany you and be prepared to drive you home after your procedure. Bring all your current medications with you. An IV may be inserted and sedation may be given at the discretion of the physician. A blood pressure cuff, EKG and other monitors will often be applied during the procedure.  Some patients may need to  have extra oxygen administered for a short period. You will be asked to provide medical information, including your allergies and medications, prior to the procedure.  We must know immediately if you are taking blood thinners (like Coumadin/Warfarin) or if you are allergic to IV iodine contrast (dye).  We must know if you could possible be pregnant.  Possible side-effects:  Bleeding from needle site Infection (rare, may require surgery) Nerve injury (rare) Numbness  & tingling (temporary) Difficulty urinating (rare, temporary) Spinal headache (a headache worse with upright posture) Light-headedness (temporary) Pain at injection site (serveral days) Decreased blood pressure (rare, temporary) Weakness in arm/leg (temporary) Pressure sensation in back/neck (temporary)   Call if you experience:  Fever/chills associated with headache or increased back/neck pain Headache worsened by an upright position New onset, weakness or numbness of an extremity below the injection site Hives or difficulty breathing (go to the emergency room) Inflammation or drainage at the injection site(s) Severe back/neck pain greater than usual New symptoms which are concerning to you  Please note:  Although the local anesthetic injected can often make your back or neck feel good for several hours after the injection, the pain will likely return. It takes 3-7 days for steroids to work.  You may not notice any pain relief for at least one week.  If effective, we will often do a series of 2-3 injections spaced 3-6 weeks apart to maximally decrease your pain.  After the initial series, you may be a candidate for a more permanent nerve block of the facets.  If you have any questions, please call #[phone] [facility]

## 2024-01-22 NOTE — Progress Notes (Signed)
 PROVIDER NOTE: Interpretation of information contained herein should be left to medically-trained personnel. Specific patient instructions are provided elsewhere under Patient Instructions section of medical record. This document was created in part using STT-dictation technology, any transcriptional errors that may result from this process are unintentional.  Patient: Charlotte Glover Type: Established DOB: October 05, 1927 MRN: [id number] PCP: Fernande Ophelia JINNY DOUGLAS, MD  Service: Procedure DOS: 01/22/2024 Setting: Ambulatory Location: Ambulatory outpatient facility Delivery: Face-to-face Provider: Wallie Sherry, MD Specialty: Interventional Pain Management Specialty designation: 09 Location: Outpatient facility Ref. Prov.: Sherry Wallie, MD       Interventional Therapy   Type: Lumbar Facet, Medial Branch Block(s) (w/ fluoroscopic mapping) #1  Laterality: Bilateral  Level: L3, L4, and L5 Medial Branch/Dorsal Rami Level(s). Injecting these levels blocks the L3-4 and L4-5 lumbar facet joints. Imaging: Fluoroscopic guidance Spinal ([id number]) Anesthesia: Local anesthesia (1-2% Lidocaine ) DOS: 01/22/2024 Performed by: Wallie Sherry, MD  Primary Purpose: Diagnostic/Therapeutic Indications: Low back pain severe enough to impact quality of life or function. 1. Lumbar facet arthropathy   2. Lumbar spondylosis    NAS-11 Pain score:   Pre-procedure: 8 /10   Post-procedure: 8 /10     Position / Prep / Materials:  Position: Prone  Prep solution: ChloraPrep (2% chlorhexidine  gluconate and 70% isopropyl alcohol ) Area Prepped: Posterolateral Lumbosacral Spine (Wide prep: From the lower border of the scapula down to the end of the tailbone and from flank to flank.)  Materials:  Tray: Block Needle(s):  Type: Spinal  Gauge (G): 22  Length: 3.5-in Qty: 3     H&P (Pre-op Assessment):  Charlotte Glover is a 89 y.o. (year old), female patient, seen today for interventional treatment. She  has a past  surgical history that includes Rotator cuff repair (Left); Cholecystectomy; Pacemaker insertion (N/A, 05/28/2014); Tonsillectomy; Breast surgery; Dilation and curettage of uterus; uterine polyp; Esophagogastroduodenoscopy; Cardiac catheterization (Bilateral, 05/08/2016); TEE without cardioversion (N/A, 05/16/2016); Anterior vitrectomy (Left, 03/20/2017); Cataract extraction w/PHACO (Left, 03/20/2017); Peripheral iridotomy (Left, 03/20/2017); Breast biopsy (Right, 2015); Artery Biopsy (Right, 04/08/2019); Esophagogastroduodenoscopy (egd) with propofol  (N/A, 12/13/2020); Colonoscopy; Atrial fibrillation ablation (N/A, 03/24/2012); Cardioversion (N/A, 04/02/2012); Atrial fibrillation ablation (N/A, 01/02/2013); Cardioversion (N/A, 01/15/2013); AV node ablation (N/A, 05/26/2014); BIV PACEMAKER INSERTION CRT-P (N/A, 07/13/2016); Knee Arthroplasty (Right, 12/25/2021); and Colonoscopy (N/A, 09/11/2023). Ms. Winstead has a current medication list which includes the following prescription(s): acetaminophen , albuterol , alendronate, eliquis, esomeprazole, furosemide , gabapentin , ipratropium, isosorbide  mononitrate, klor-con  m10, metolazone , preservision areds 2, nitroglycerin , polyvinyl alcohol -povidone, prednisone , rosuvastatin , senna, aerochamber mv, spironolactone , tramadol , and triamcinolone ointment. Her primarily concern today is the Back Pain  Initial Vital Signs:  Pulse/HCG Rate: 79ECG Heart Rate: 70 Temp: (!) 97.3 F (36.3 C) Resp: 18 BP: 128/67 SpO2: 100 %  BMI: Estimated body mass index is 19.94 kg/m as calculated from the following:   Height as of this encounter: 5' 9 (1.753 m).   Weight as of this encounter: 135 lb (61.2 kg).  Risk Assessment: Allergies: Reviewed. She is allergic to amiodarone, baclofen, metoprolol , pantoprazole , silicone, and tape.  Allergy Precautions: None required Coagulopathies: Reviewed. None identified.  Blood-thinner therapy: None at this time Active Infection(s): Reviewed.  None identified. Charlotte Glover is afebrile  Site Confirmation: Charlotte Glover was asked to confirm the procedure and laterality before marking the site Procedure checklist: Completed Consent: Before the procedure and under the influence of no sedative(s), amnesic(s), or anxiolytics, the patient was informed of the treatment options, risks and possible complications. To fulfill our ethical and legal obligations, as recommended by the American Medical  Association's Code of Ethics, I have informed the patient of my clinical impression; the nature and purpose of the treatment or procedure; the risks, benefits, and possible complications of the intervention; the alternatives, including doing nothing; the risk(s) and benefit(s) of the alternative treatment(s) or procedure(s); and the risk(s) and benefit(s) of doing nothing. The patient was provided information about the general risks and possible complications associated with the procedure. These may include, but are not limited to: failure to achieve desired goals, infection, bleeding, organ or nerve damage, allergic reactions, paralysis, and death. In addition, the patient was informed of those risks and complications associated to Spine-related procedures, such as failure to decrease pain; infection (i.e.: Meningitis, epidural or intraspinal abscess); bleeding (i.e.: epidural hematoma, subarachnoid hemorrhage, or any other type of intraspinal or peri-dural bleeding); organ or nerve damage (i.e.: Any type of peripheral nerve, nerve root, or spinal cord injury) with subsequent damage to sensory, motor, and/or autonomic systems, resulting in permanent pain, numbness, and/or weakness of one or several areas of the body; allergic reactions; (i.e.: anaphylactic reaction); and/or death. Furthermore, the patient was informed of those risks and complications associated with the medications. These include, but are not limited to: allergic reactions (i.e.: anaphylactic or  anaphylactoid reaction(s)); adrenal axis suppression; blood sugar elevation that in diabetics may result in ketoacidosis or comma; water retention that in patients with history of congestive heart failure may result in shortness of breath, pulmonary edema, and decompensation with resultant heart failure; weight gain; swelling or edema; medication-induced neural toxicity; particulate matter embolism and blood vessel occlusion with resultant organ, and/or nervous system infarction; and/or aseptic necrosis of one or more joints. Finally, the patient was informed that Medicine is not an exact science; therefore, there is also the possibility of unforeseen or unpredictable risks and/or possible complications that may result in a catastrophic outcome. The patient indicated having understood very clearly. We have given the patient no guarantees and we have made no promises. Enough time was given to the patient to ask questions, all of which were answered to the patient's satisfaction. Ms. Knies has indicated that she wanted to continue with the procedure. Attestation: I, the ordering provider, attest that I have discussed with the patient the benefits, risks, side-effects, alternatives, likelihood of achieving goals, and potential problems during recovery for the procedure that I have provided informed consent. Date  Time: 01/22/2024  1:28 PM  Pre-Procedure Preparation:  Monitoring: As per clinic protocol. Respiration, ETCO2, SpO2, BP, heart rate and rhythm monitor placed and checked for adequate function Safety Precautions: Patient was assessed for positional comfort and pressure points before starting the procedure. Time-out: I initiated and conducted the Time-out before starting the procedure, as per protocol. The patient was asked to participate by confirming the accuracy of the Time Out information. Verification of the correct person, site, and procedure were performed and confirmed by me, the nursing  staff, and the patient. Time-out conducted as per Joint Commission's Universal Protocol (UP.01.01.01). Time: 1355 Start Time: 1355 hrs.  Description of Procedure:          Laterality: (see above) Targeted Levels: (see above)  Safety Precautions: Aspiration looking for blood return was conducted prior to all injections. At no point did we inject any substances, as a needle was being advanced. Before injecting, the patient was told to immediately notify me if she was experiencing any new onset of ringing in the ears, or metallic taste in the mouth. No attempts were made at seeking any paresthesias. Safe injection  practices and needle disposal techniques used. Medications properly checked for expiration dates. SDV (single dose vial) medications used. After the completion of the procedure, all disposable equipment used was discarded in the proper designated medical waste containers. Local Anesthesia: Protocol guidelines were followed. The patient was positioned over the fluoroscopy table. The area was prepped in the usual manner. The time-out was completed. The target area was identified using fluoroscopy. A 12-in long, straight, sterile hemostat was used with fluoroscopic guidance to locate the targets for each level blocked. Once located, the skin was marked with an approved surgical skin marker. Once all sites were marked, the skin (epidermis, dermis, and hypodermis), as well as deeper tissues (fat, connective tissue and muscle) were infiltrated with a small amount of a short-acting local anesthetic, loaded on a 10cc syringe with a 25G, 1.5-in  Needle. An appropriate amount of time was allowed for local anesthetics to take effect before proceeding to the next step. Local Anesthetic: Lidocaine  2.0% The unused portion of the local anesthetic was discarded in the proper designated containers. Technical description of process:  Medial Branch  Dorsal Rami Nerve Block (MBB):  Neuroanatomy note: Each  lumbar facet joint receives dual innervation from medial branches arising from the posterior primary rami at the same level and one level above. The target for each lumbar medial branch is the junction of the ipsilateral superior articular and transverse process of the lower vertebral body. (i.e.: The L4-L5 facet joint is innervated by the L4 medial branch located at L5 and the L3 medial branch located at L4. Blocking the L4 Medial Branch is therefore achieved by injecting at the junction of the ipsilateral superior articular and transverse process of the lower vertebral body L5.).  Exception: The exception to the above rule is the L5-S1 facet joint which has triple innervation requiring the L4 medial branch, as well as the L5 and the S1 Dorsal Rami(s) to be blocked to fully denervate the joint.  Under fluoroscopic guidance, a needle was inserted until contact was made with os over the target area. After negative aspiration, 2mL of the nerve block solution was injected without difficulty or complication. Paresthesia were avoided during injection. The needle(s) were removed intact and without complication.  Once the entire procedure was completed, the treated area was cleaned, making sure to leave some of the prepping solution back to take advantage of its long term bactericidal properties.         Illustration of the posterior view of the lumbar spine and the posterior neural structures. Laminae of L2 through S1 are labeled. DPRL5, dorsal primary ramus of L5; DPRS1, dorsal primary ramus of S1; DPR3, dorsal primary ramus of L3; FJ, facet (zygapophyseal) joint L3-L4; I, inferior articular process of L4; LB1, lateral branch of dorsal primary ramus of L1; IAB, inferior articular branches from L3 medial branch (supplies L4-L5 facet joint); IBP, intermediate branch plexus; MB3, medial branch of dorsal primary ramus of L3; NR3, third lumbar nerve root; S, superior articular process of L5; SAB, superior articular  branches from L4 (supplies L4-5 facet joint also); TP3, transverse process of L3.   Facet Joint Innervation (* possible contribution)  L1-2 T12, L1 (L2*)  Medial Branch  L2-3 L1, L2 (L3*)                     L3-4 L2, L3 (L4*)                     L4-5 L3, L4 (  L5*)                     L5-S1 L4, L5, S1                        Vitals:   01/22/24 1333 01/22/24 1355 01/22/24 1400  BP: 128/67 139/65 (!) 130/92  Pulse: 79    Resp: 18 18 18   Temp: (!) 97.3 F (36.3 C)    TempSrc: Temporal    SpO2: 100% 97% 99%  Weight: 135 lb (61.2 kg)    Height: 5' 9 (1.753 m)       End Time: 1408 hrs.  Imaging Guidance (Spinal):         Type of Imaging Technique: Fluoroscopy Guidance (Spinal) Indication(s): Fluoroscopy guidance for needle placement to enhance accuracy in procedures requiring precise needle localization for targeted delivery of medication in or near specific anatomical locations not easily accessible without such real-time imaging assistance. Exposure Time: Please see nurses notes. Contrast: None used. Fluoroscopic Guidance: I was personally present during the use of fluoroscopy. Tunnel Vision Technique used to obtain the best possible view of the target area. Parallax error corrected before commencing the procedure. Direction-depth-direction technique used to introduce the needle under continuous pulsed fluoroscopy. Once target was reached, antero-posterior, oblique, and lateral fluoroscopic projection used confirm needle placement in all planes. Images permanently stored in EMR. Interpretation: No contrast injected. I personally interpreted the imaging intraoperatively. Adequate needle placement confirmed in multiple planes. Permanent images saved into the patient's record.  Post-operative Assessment:  Post-procedure Vital Signs:  Pulse/HCG Rate: 7973 Temp: (!) 97.3 F (36.3 C) Resp: 18 BP: (!) 130/92 SpO2: 99 %  EBL: None  Complications: No immediate post-treatment  complications observed by team, or reported by patient.  Note: The patient tolerated the entire procedure well. A repeat set of vitals were taken after the procedure and the patient was kept under observation following institutional policy, for this type of procedure. Post-procedural neurological assessment was performed, showing return to baseline, prior to discharge. The patient was provided with post-procedure discharge instructions, including a section on how to identify potential problems. Should any problems arise concerning this procedure, the patient was given instructions to immediately contact us , at any time, without hesitation. In any case, we plan to contact the patient by telephone for a follow-up status report regarding this interventional procedure.  Comments:  No additional relevant information.  Plan of Care (POC)  Orders:  No orders of the defined types were placed in this encounter.  Patient states that she is out of her tramadol , last prescription 01/01/2024.  PMP checked and reviewed.  Small prescription for postprocedural pain as below as the patient was fairly uncomfortable in the waiting room.  I encouraged her to utilize a stool softener and MiraLAX to prevent constipation  Medications ordered for procedure: Meds ordered this encounter  Medications   lidocaine  (XYLOCAINE ) 2 % (with pres) injection 400 mg   ropivacaine  (PF) 2 mg/mL (0.2%) (NAROPIN ) injection 18 mL   dexamethasone  (DECADRON ) injection 20 mg   traMADol  (ULTRAM ) 50 MG tablet    Sig: Take 1 tablet (50 mg total) by mouth every 12 (twelve) hours as needed for severe pain (pain score 7-10).    Dispense:  60 tablet    Refill:  0    Fill one day early if pharmacy is closed on scheduled refill date.   Medications administered: We administered lidocaine , ropivacaine  (PF) 2 mg/mL (0.2%),  and dexamethasone .  See the medical record for exact dosing, route, and time of administration.    Bilateral L3, L4, L5  medial branch nerve blocks 01/22/2024    Follow-up plan:   Return in about 3 weeks (around 02/12/2024) for f15fppe.     Recent Visits Date Type Provider Dept  12/31/23 Office Visit Marcelino Nurse, MD [facility]  Showing recent visits within past 90 days and meeting all other requirements Today's Visits Date Type Provider Dept  01/22/24 Procedure visit Marcelino Nurse, MD [facility]  Showing today's visits and meeting all other requirements Future Appointments Date Type Provider Dept  02/18/24 Appointment Marcelino Nurse, MD [facility]  Showing future appointments within next 90 days and meeting all other requirements   Disposition: Discharge home  Discharge (Date  Time): 01/22/2024; 1425 hrs.   Primary Care Physician: Fernande Ophelia JINNY DOUGLAS, MD Location: [facility] Note by: Nurse Marcelino, MD (TTS technology used. I apologize for any typographical errors that were not detected and corrected.) Date: 01/22/2024; Time: 3:05 PM  Disclaimer:  Medicine is not an Visual merchandiser. The only guarantee in medicine is that nothing is guaranteed. It is important to note that the decision to proceed with this intervention was based on the information collected from the patient. The Data and conclusions were drawn from the patient's questionnaire, the interview, and the physical examination. Because the information was provided in large part by the patient, it cannot be guaranteed that it has not been purposely or unconsciously manipulated. Every effort has been made to obtain as much relevant data as possible for this evaluation. It is important to note that the conclusions that lead to this procedure are derived in large part from the available data. Always take into account that the treatment will also be dependent on availability of resources and existing treatment guidelines, considered by other Pain Management Practitioners as being common knowledge  and practice, at the time of the intervention. For Medico-Legal purposes, it is also important to point out that variation in procedural techniques and pharmacological choices are the acceptable norm. The indications, contraindications, technique, and results of the above procedure should only be interpreted and judged by a Board-Certified Interventional Pain Specialist with extensive familiarity and expertise in the same exact procedure and technique.

## 2024-01-22 NOTE — Progress Notes (Signed)
 Safety precautions to be maintained throughout the outpatient stay will include: orient to surroundings, keep bed in low position, maintain call bell within reach at all times, provide assistance with transfer out of bed and ambulation.

## 2024-01-23 ENCOUNTER — Telehealth: Payer: Self-pay

## 2024-01-23 NOTE — Telephone Encounter (Signed)
 Post procedure follow up.  LM

## 2024-01-27 ENCOUNTER — Encounter: Attending: Physician Assistant | Admitting: Physician Assistant

## 2024-01-27 DIAGNOSIS — M316 Other giant cell arteritis: Secondary | ICD-10-CM | POA: Insufficient documentation

## 2024-01-27 DIAGNOSIS — L97822 Non-pressure chronic ulcer of other part of left lower leg with fat layer exposed: Secondary | ICD-10-CM | POA: Insufficient documentation

## 2024-01-27 DIAGNOSIS — Z950 Presence of cardiac pacemaker: Secondary | ICD-10-CM | POA: Insufficient documentation

## 2024-01-27 DIAGNOSIS — I129 Hypertensive chronic kidney disease with stage 1 through stage 4 chronic kidney disease, or unspecified chronic kidney disease: Secondary | ICD-10-CM | POA: Insufficient documentation

## 2024-01-27 DIAGNOSIS — I480 Paroxysmal atrial fibrillation: Secondary | ICD-10-CM | POA: Insufficient documentation

## 2024-01-27 DIAGNOSIS — N183 Chronic kidney disease, stage 3 unspecified: Secondary | ICD-10-CM | POA: Diagnosis not present

## 2024-01-27 DIAGNOSIS — I87332 Chronic venous hypertension (idiopathic) with ulcer and inflammation of left lower extremity: Secondary | ICD-10-CM | POA: Diagnosis present

## 2024-01-30 ENCOUNTER — Encounter: Payer: Self-pay | Admitting: Student in an Organized Health Care Education/Training Program

## 2024-01-31 ENCOUNTER — Telehealth: Payer: Self-pay

## 2024-01-31 NOTE — Telephone Encounter (Signed)
 Since I had the nerve block on July 30th, 7 days ago, I have had no relief from the pain. I had one good day and one day with only one tramadol . The others have all required 2tramadol. My bowels are suffering. One morning I vomited for no apparent reason. There is still numbness in my legs in throughout the morning. I am very discouraged. Very disheartened. My appointment isn't for another 2weeks. Hard to think of going another 2weeks with these continual problems.  Thank you, Charlotte Glover

## 2024-02-03 ENCOUNTER — Encounter: Admitting: Physician Assistant

## 2024-02-03 DIAGNOSIS — I87332 Chronic venous hypertension (idiopathic) with ulcer and inflammation of left lower extremity: Secondary | ICD-10-CM | POA: Diagnosis not present

## 2024-02-10 ENCOUNTER — Encounter: Admitting: Physician Assistant

## 2024-02-10 DIAGNOSIS — I87332 Chronic venous hypertension (idiopathic) with ulcer and inflammation of left lower extremity: Secondary | ICD-10-CM | POA: Diagnosis not present

## 2024-02-11 ENCOUNTER — Encounter: Payer: Self-pay | Admitting: Student in an Organized Health Care Education/Training Program

## 2024-02-11 ENCOUNTER — Ambulatory Visit
Attending: Student in an Organized Health Care Education/Training Program | Admitting: Student in an Organized Health Care Education/Training Program

## 2024-02-11 VITALS — HR 70 | Temp 97.3°F | Resp 16 | Ht 67.0 in | Wt 130.0 lb

## 2024-02-11 DIAGNOSIS — M47816 Spondylosis without myelopathy or radiculopathy, lumbar region: Secondary | ICD-10-CM | POA: Diagnosis not present

## 2024-02-11 DIAGNOSIS — M5136 Other intervertebral disc degeneration, lumbar region with discogenic back pain only: Secondary | ICD-10-CM | POA: Insufficient documentation

## 2024-02-11 DIAGNOSIS — G894 Chronic pain syndrome: Secondary | ICD-10-CM | POA: Insufficient documentation

## 2024-02-11 MED ORDER — TRAMADOL HCL 50 MG PO TABS: 50.0000 mg | ORAL_TABLET | Freq: Two times a day (BID) | ORAL | 2 refills | Status: AC | PRN

## 2024-02-11 NOTE — Progress Notes (Signed)
 PROVIDER NOTE: Interpretation of information contained herein should be left to medically-trained personnel. Specific patient instructions are provided elsewhere under Patient Instructions section of medical record. This document was created in part using AI and STT-dictation technology, any transcriptional errors that may result from this process are unintentional.  Patient: Charlotte Glover  Service: E/M   PCP: Fernande Ophelia JINNY DOUGLAS, MD  DOB: 09-09-1927  DOS: 02/11/2024  Provider: Wallie Sherry, MD  MRN: [id number]  Delivery: Face-to-face  Specialty: Interventional Pain Management  Type: Established Patient  Setting: Ambulatory outpatient facility  Specialty designation: 09  Referring Prov.: Fernande Ophelia JINNY DOUGLAS, MD  Location: Outpatient office facility       History of present illness (HPI) Charlotte Glover, a 89 y.o. year old female, is here today because of her Lumbar facet arthropathy [M47.816]. Ms. Charlotte Glover primary complain today is Back Pain (lower) and Pain (Bilat shoulder blades)   Pain Assessment: Severity of Chronic pain is reported as a 8 /10. Location: Back Lower/denies. Onset: More than a month ago. Quality: Aching, Sharp, Spasm. Timing: Intermittent. Modifying factor(s): rest, tramadol , ice. Vitals:  height is 5' 7 (1.702 m) and weight is 130 lb (59 kg). Her temperature is 97.3 F (36.3 C) (abnormal). Her pulse is 70. Her respiration is 16 and oxygen saturation is 100%.  BMI: Estimated body mass index is 20.36 kg/m as calculated from the following:   Height as of this encounter: 5' 7 (1.702 m).   Weight as of this encounter: 130 lb (59 kg).  Last encounter: 12/31/2023. Last procedure: 01/22/2024.  Reason for encounter: PPE  Post-Procedure Evaluation   Type: Lumbar Facet, Medial Branch Block(s) (w/ fluoroscopic mapping) #1  Laterality: Bilateral  Level: L3, L4, and L5 Medial Branch/Dorsal Rami Level(s). Injecting these levels blocks the L3-4 and L4-5 lumbar facet  joints. Imaging: Fluoroscopic guidance Spinal ([id number]) Anesthesia: Local anesthesia (1-2% Lidocaine ) DOS: 01/22/2024 Performed by: Wallie Sherry, MD  Primary Purpose: Diagnostic/Therapeutic Indications: Low back pain severe enough to impact quality of life or function. 1. Lumbar facet arthropathy   2. Lumbar spondylosis    NAS-11 Pain score:   Pre-procedure: 8 /10   Post-procedure: 8 /10     Effectiveness:  Initial hour after procedure: 0 %  Subsequent 4-6 hours post-procedure: 0 %  Analgesia past initial 6 hours: 0 %  Ongoing improvement: 0%  Discussed the use of AI scribe software for clinical note transcription with the patient, who gave verbal consent to proceed.  History of Present Illness   Charlotte Glover is a 89 year old female who presents for pain management.  She experiences chronic lower back pain, described as a 'big curve' that pushes down on one side of her hip. She has previously attempted physical therapy but has not used a TENS unit. She frequently uses a heating pad to manage her pain.  She suffers from significant neck pain due to arthritis, which radiates up the back of her head, making it difficult to rest her head on surfaces. This issue has not been previously mentioned due to her focus on back pain.  Her current medications include gabapentin , 600 mg, though the exact frequency is unclear. She also uses tramadol , taking two tablets in the morning, sometimes reducing the dose to one tablet depending on her pain level. She is concerned about the constipating effects of tramadol  and its potential for dependence.  She has tried CBD gummies in the past, which seemed to help with her bowel movements  and provided some pain relief.  Recently, she has developed new pain across her shoulders, which typically occurs in the evening.      HPI from initial clinic visit 12/31/2023 History of Present Illness   Charlotte Glover is a 89 year  old female with significant cardiac history and lumbar spine issues who presents with chronic low back pain. She was referred by Dr. Dodson to the pain center for further management of her chronic low back pain.   She experiences chronic low back pain located just above her waist, to the left of her spine. The pain is described as a knife-like sensation and is focal, without radiation to her legs or buttocks.   She has received two lumbar epidural steroid injections injections in the past few months, which were not effective; the first provided no relief and the second exacerbated her pain. Attempts to adjust the injection site did not improve her condition.   She has a history of lumbar spine stenosis and arthritis, particularly at L4 and L5, confirmed by CT imaging. These conditions contribute significantly to her low back pain.   Her current medication regimen includes Tramadol , which is causing severe constipation. She is also on a low dose of Eliquis, which she will need to stop three days prior to any procedures.   She has a [employer] pacemaker and cannot undergo MRI due to a retained lead from a previous pacemaker replacement.      Pharmacotherapy Assessment   Tramadol  50 mg twice daily as needed Monitoring: [place] PMP: PDMP reviewed during this encounter.       Pharmacotherapy: No side-effects or adverse reactions reported. Compliance: No problems identified. Effectiveness: Clinically acceptable.  Charlotte Pulling, RN  02/11/2024  1:57 PM  Sign when Signing Visit Safety precautions to be maintained throughout the outpatient stay will include: orient to surroundings, keep bed in low position, maintain call bell within reach at all times, provide assistance with transfer out of bed and ambulation.   UDS:  Summary  Date Value Ref Range Status  12/31/2023 FINAL  Final    Comment:    ==================================================================== Compliance Drug Analysis,  Ur ==================================================================== Test                             Result       Flag       Units  Drug Present and Declared for Prescription Verification   Tramadol                        >7463        EXPECTED   ng/mg creat   O-Desmethyltramadol            >7463        EXPECTED   ng/mg creat   N-Desmethyltramadol            2158         EXPECTED   ng/mg creat    Source of tramadol  is a prescription medication. O-desmethyltramadol    and N-desmethyltramadol are expected metabolites of tramadol .    Gabapentin                      PRESENT      EXPECTED   Acetaminophen                   PRESENT      EXPECTED  Drug Present not Declared  for Prescription Verification   Carboxy-THC                    39           UNEXPECTED ng/mg creat    Carboxy-THC is a metabolite of tetrahydrocannabinol (THC). Source of    THC is most commonly herbal marijuana or marijuana-based products,    but THC is also present in a scheduled prescription medication.    Trace amounts of THC can be present in hemp and cannabidiol (CBD)    products. This test is not intended to distinguish between delta-9-    tetrahydrocannabinol, the predominant form of THC in most herbal or    marijuana-based products, and delta-8-tetrahydrocannabinol.    Diphenhydramine                 PRESENT      UNEXPECTED ==================================================================== Test                      Result    Flag   Units      Ref Range   Creatinine              67               mg/dL      >=79 ==================================================================== Declared Medications:  The flagging and interpretation on this report are based on the  following declared medications.  Unexpected results may arise from  inaccuracies in the declared medications.   **Note: The testing scope of this panel includes these medications:   Gabapentin   Tramadol  (Ultram )   **Note: The testing scope of this  panel does not include small to  moderate amounts of these reported medications:   Acetaminophen  (Tylenol )   **Note: The testing scope of this panel does not include the  following reported medications:   Albuterol  (Ventolin  HFA)  Alendronate (Fosamax)  Esomeprazole (Nexium)  Eye Drops  Furosemide  (Lasix )  Ipratropium (Atrovent)  Isosorbide  (Imdur )  Metolazone  (Zaroxolyn )  Nitroglycerin  (Nitrostat )  Potassium Chloride   Prednisone  (Deltasone )  Rosuvastatin  (Crestor )  Sennosides (Senokot)  Spironolactone  (Aldactone )  Supplement  Triamcinolone (Kenalog) ==================================================================== For clinical consultation, please call [phone]. ====================================================================     No results found for: CBDTHCR No results found for: D8THCCBX No results found for: D9THCCBX  ROS  Constitutional: Denies any fever or chills Gastrointestinal: No reported hemesis, hematochezia, vomiting, or acute GI distress Musculoskeletal: Cervicalgia, shoulder pain, low back pain Neurological: No reported episodes of acute onset apraxia, aphasia, dysarthria, agnosia, amnesia, paralysis, loss of coordination, or loss of consciousness  Medication Review  AeroChamber MV, Polyvinyl Alcohol -Povidone, PreserVision AREDS 2, acetaminophen , albuterol , alendronate, apixaban, esomeprazole, furosemide , gabapentin , ipratropium, isosorbide  mononitrate, metolazone , nitroGLYCERIN , potassium chloride , predniSONE , rosuvastatin , senna, spironolactone , traMADol , and triamcinolone ointment  History Review  Allergy: Ms. Charlotte Glover is allergic to amiodarone, baclofen, metoprolol , pantoprazole , silicone, and tape. Drug: Ms. Charlotte Glover  reports no history of drug use. Alcohol :  reports current alcohol  use of about 7.0 standard drinks of alcohol  per week. Tobacco:  reports that she has never smoked. She has never used smokeless tobacco. Social: Ms.  Charlotte Glover  reports that she has never smoked. She has never used smokeless tobacco. She reports current alcohol  use of about 7.0 standard drinks of alcohol  per week. She reports that she does not use drugs. Medical:  has a past medical history of (HFpEF) heart failure with preserved ejection fraction (HCC) (06/21/2020), Amaurosis fugax of left eye, Arthritis, Atrial fibrillation (HCC), Cardiac arrest (HCC) (10/2009), Cardiac  murmur, CHB (complete heart block) (HCC), Chickenpox, CKD (chronic kidney disease), stage III (HCC), Coronary artery disease, Ductal carcinoma in situ (DCIS) of right breast (07/13/2013), Dyslipidemia (09/15/2023), Dyspnea, Edema, GERD (gastroesophageal reflux disease), Granulomatous disease (HCC), Hiatal hernia, HLD (hyperlipidemia), HOH (hard of hearing), Hypertension (01/23/2012), Jackhammer esophagus, Long term current use of antithrombotics/antiplatelets, Melanoma of lower leg, left (HCC), Meningioma, multiple (HCC) (07/14/2019), NSTEMI (non-ST elevated myocardial infarction) (HCC) (08/2009), Presence of permanent cardiac pacemaker, Pulmonary HTN (HCC) (05/08/2016), S/P ablation of atrial fibrillation, Shingles, SSS (sick sinus syndrome) (HCC), Valvular heart disease (02/08/2012), and Wears hearing aid in both ears. Surgical: Ms. Charlotte Glover  has a past surgical history that includes Rotator cuff repair (Left); Cholecystectomy; Pacemaker insertion (N/A, 05/28/2014); Tonsillectomy; Breast surgery; Dilation and curettage of uterus; uterine polyp; Esophagogastroduodenoscopy; Cardiac catheterization (Bilateral, 05/08/2016); TEE without cardioversion (N/A, 05/16/2016); Anterior vitrectomy (Left, 03/20/2017); Cataract extraction w/PHACO (Left, 03/20/2017); Peripheral iridotomy (Left, 03/20/2017); Breast biopsy (Right, 2015); Artery Biopsy (Right, 04/08/2019); Esophagogastroduodenoscopy (egd) with propofol  (N/A, 12/13/2020); Colonoscopy; Atrial fibrillation ablation (N/A, 03/24/2012); Cardioversion (N/A,  04/02/2012); Atrial fibrillation ablation (N/A, 01/02/2013); Cardioversion (N/A, 01/15/2013); AV node ablation (N/A, 05/26/2014); BIV PACEMAKER INSERTION CRT-P (N/A, 07/13/2016); Knee Arthroplasty (Right, 12/25/2021); and Colonoscopy (N/A, 09/11/2023). Family: family history includes Breast cancer in her daughter and mother; Cancer in her brother, daughter, sister, and son.  Laboratory Chemistry Profile   Renal Lab Results  Component Value Date   BUN 27 (H) 10/30/2023   CREATININE 1.41 (H) 10/30/2023   GFRAA 50 (L) 04/08/2019   GFRNONAA 35 (L) 10/30/2023    Hepatic Lab Results  Component Value Date   AST 22 10/30/2023   ALT 15 10/30/2023   ALBUMIN 3.6 10/30/2023   ALKPHOS 28 (L) 10/30/2023    Electrolytes Lab Results  Component Value Date   NA 134 (L) 10/30/2023   K 3.1 (L) 10/30/2023   CL 92 (L) 10/30/2023   CALCIUM  9.1 10/30/2023   MG 2.3 11/25/2018    Bone No results found for: VD25OH, [id number], CI6874NY7, CI7874NY7, 25OHVITD1, 25OHVITD2, 25OHVITD3, TESTOFREE, TESTOSTERONE  Inflammation (CRP: Acute Phase) (ESR: Chronic Phase) Lab Results  Component Value Date   CRP 1.9 (H) 12/13/2021   ESRSEDRATE 46 (H) 12/13/2021         Note: Above Lab results reviewed.  Recent Imaging Review  DG PAIN CLINIC C-ARM 1-60 MIN NO REPORT Fluoro was used, but no Radiologist interpretation will be provided.  Please refer to NOTES tab for provider progress note. Note: Reviewed        Physical Exam  Vitals: Pulse 70   Temp (!) 97.3 F (36.3 C)   Resp 16   Ht 5' 7 (1.702 m)   Wt 130 lb (59 kg)   SpO2 100%   BMI 20.36 kg/m  BMI: Estimated body mass index is 20.36 kg/m as calculated from the following:   Height as of this encounter: 5' 7 (1.702 m).   Weight as of this encounter: 130 lb (59 kg). Ideal: Ideal body weight: 61.6 kg (135 lb 12.9 oz) General appearance: Well nourished, well developed, and well hydrated. In no apparent acute distress Mental  status: Alert, oriented x 3 (person, place, & time)       Respiratory: No evidence of acute respiratory distress Eyes: PERLA   Assessment   Diagnosis  1. Lumbar facet arthropathy   2. Lumbar spondylosis   3. Degeneration of intervertebral disc of lumbar region with discogenic back pain   4. Chronic pain syndrome      Updated Problems:  No problems updated.  Plan of Care  Assessment and Plan    Chronic pain of lower back, neck, and shoulders   Chronic pain in the lower back, neck, and shoulders significantly impacts her quality of life. Previous treatments included medications and physical therapy. Concerns about tramadol  use were addressed, differentiating between dependence and addiction, with constipation noted as a side effect. Recommend using a TENS unit for pain management, applying it to the lower back, neck, and shoulders as needed. Encourage heat therapy, such as heating pads or warm baths, to relax muscles. Consider trying CBD gummies for pain relief and to potentially reduce tramadol  use. Refill tramadol  prescription to be picked up after September 1st, with a follow-up in October to assess pain management strategies.  Constipation secondary to opioid use   Constipation is a side effect of tramadol  use, contributing to discomfort and pain. Consider using CBD gummies to potentially alleviate constipation and reduce tramadol  use.        Ms. Charlotte Glover has a current medication list which includes the following long-term medication(s): albuterol , eliquis, gabapentin , ipratropium, isosorbide  mononitrate, metolazone , nitroglycerin , rosuvastatin , and spironolactone .  Pharmacotherapy (Medications Ordered): Meds ordered this encounter  Medications   traMADol  (ULTRAM ) 50 MG tablet    Sig: Take 1 tablet (50 mg total) by mouth every 12 (twelve) hours as needed for severe pain (pain score 7-10).    Dispense:  60 tablet    Refill:  2    Fill one day early if pharmacy is  closed on scheduled refill date.   Orders:  No orders of the defined types were placed in this encounter.    Bilateral L3, L4, L5 medial branch nerve blocks 01/22/2024    Return in about 9 weeks (around 04/14/2024) for MM, F2F.    Recent Visits Date Type Provider Dept  01/22/24 Procedure visit Marcelino Nurse, MD [facility]  12/31/23 Office Visit Marcelino Nurse, MD [facility]  Showing recent visits within past 90 days and meeting all other requirements Today's Visits Date Type Provider Dept  02/11/24 Office Visit Marcelino Nurse, MD [facility]  Showing today's visits and meeting all other requirements Future Appointments Date Type Provider Dept  04/16/24 Appointment Marcelino Nurse, MD [facility]  Showing future appointments within next 90 days and meeting all other requirements  I discussed the assessment and treatment plan with the patient. The patient was provided an opportunity to ask questions and all were answered. The patient agreed with the plan and demonstrated an understanding of the instructions.  Patient advised to call back or seek an in-person evaluation if the symptoms or condition worsens.  Duration of encounter: .  Total time on encounter, as per AMA guidelines included both the face-to-face and non-face-to-face time personally spent by the physician and/or other qualified health care professional(s) on the day of the encounter (includes time in activities that require the physician or other qualified health care professional and does not include time in activities normally performed by clinical staff). Physician's time may include the following activities when performed: Preparing to see the patient (e.g., pre-charting review of records, searching for previously ordered imaging, lab work, and nerve conduction tests) Review of prior analgesic pharmacotherapies. Reviewing PMP Interpreting ordered tests (e.g., lab work,  imaging, nerve conduction tests) Performing post-procedure evaluations, including interpretation of diagnostic procedures Obtaining and/or reviewing separately obtained history Performing a medically appropriate examination and/or evaluation Counseling and educating the patient/family/caregiver Ordering medications, tests, or procedures Referring and communicating  with other health care professionals (when not separately reported) Documenting clinical information in the electronic or other health record Independently interpreting results (not separately reported) and communicating results to the patient/ family/caregiver Care coordination (not separately reported)  Note by: Wallie Sherry, MD (TTS and AI technology used. I apologize for any typographical errors that were not detected and corrected.) Date: 02/11/2024; Time: 2:49 PM

## 2024-02-11 NOTE — Patient Instructions (Signed)
 TENS unit

## 2024-02-11 NOTE — Progress Notes (Signed)
 Safety precautions to be maintained throughout the outpatient stay will include: orient to surroundings, keep bed in low position, maintain call bell within reach at all times, provide assistance with transfer out of bed and ambulation.

## 2024-02-17 ENCOUNTER — Encounter: Admitting: Internal Medicine

## 2024-02-17 DIAGNOSIS — I87332 Chronic venous hypertension (idiopathic) with ulcer and inflammation of left lower extremity: Secondary | ICD-10-CM | POA: Diagnosis not present

## 2024-02-18 ENCOUNTER — Ambulatory Visit: Admitting: Student in an Organized Health Care Education/Training Program

## 2024-02-19 ENCOUNTER — Encounter: Payer: Self-pay | Admitting: Student in an Organized Health Care Education/Training Program

## 2024-02-25 ENCOUNTER — Encounter: Attending: Physician Assistant | Admitting: Physician Assistant

## 2024-02-25 DIAGNOSIS — Z950 Presence of cardiac pacemaker: Secondary | ICD-10-CM | POA: Insufficient documentation

## 2024-02-25 DIAGNOSIS — L97822 Non-pressure chronic ulcer of other part of left lower leg with fat layer exposed: Secondary | ICD-10-CM | POA: Insufficient documentation

## 2024-02-25 DIAGNOSIS — M316 Other giant cell arteritis: Secondary | ICD-10-CM | POA: Insufficient documentation

## 2024-02-25 DIAGNOSIS — N183 Chronic kidney disease, stage 3 unspecified: Secondary | ICD-10-CM | POA: Insufficient documentation

## 2024-02-25 DIAGNOSIS — I480 Paroxysmal atrial fibrillation: Secondary | ICD-10-CM | POA: Insufficient documentation

## 2024-02-25 DIAGNOSIS — I129 Hypertensive chronic kidney disease with stage 1 through stage 4 chronic kidney disease, or unspecified chronic kidney disease: Secondary | ICD-10-CM | POA: Insufficient documentation

## 2024-02-25 DIAGNOSIS — I87332 Chronic venous hypertension (idiopathic) with ulcer and inflammation of left lower extremity: Secondary | ICD-10-CM | POA: Insufficient documentation

## 2024-03-03 ENCOUNTER — Encounter: Admitting: Physician Assistant

## 2024-03-03 DIAGNOSIS — I129 Hypertensive chronic kidney disease with stage 1 through stage 4 chronic kidney disease, or unspecified chronic kidney disease: Secondary | ICD-10-CM | POA: Diagnosis not present

## 2024-03-03 DIAGNOSIS — I480 Paroxysmal atrial fibrillation: Secondary | ICD-10-CM | POA: Diagnosis not present

## 2024-03-03 DIAGNOSIS — I87332 Chronic venous hypertension (idiopathic) with ulcer and inflammation of left lower extremity: Secondary | ICD-10-CM | POA: Diagnosis present

## 2024-03-03 DIAGNOSIS — M316 Other giant cell arteritis: Secondary | ICD-10-CM | POA: Diagnosis not present

## 2024-03-03 DIAGNOSIS — Z950 Presence of cardiac pacemaker: Secondary | ICD-10-CM | POA: Diagnosis not present

## 2024-03-03 DIAGNOSIS — N183 Chronic kidney disease, stage 3 unspecified: Secondary | ICD-10-CM | POA: Diagnosis not present

## 2024-03-03 DIAGNOSIS — L97822 Non-pressure chronic ulcer of other part of left lower leg with fat layer exposed: Secondary | ICD-10-CM | POA: Diagnosis not present

## 2024-03-05 ENCOUNTER — Inpatient Hospital Stay
Admission: EM | Admit: 2024-03-05 | Discharge: 2024-03-09 | DRG: 392 | Disposition: A | Discharge status: Home / Self Care | Attending: Hospitalist | Admitting: Hospitalist

## 2024-03-05 ENCOUNTER — Emergency Department

## 2024-03-05 ENCOUNTER — Encounter: Payer: Self-pay | Admitting: Emergency Medicine

## 2024-03-05 ENCOUNTER — Other Ambulatory Visit: Payer: Self-pay

## 2024-03-05 DIAGNOSIS — Z79891 Long term (current) use of opiate analgesic: Secondary | ICD-10-CM

## 2024-03-05 DIAGNOSIS — Z85820 Personal history of malignant melanoma of skin: Secondary | ICD-10-CM | POA: Diagnosis not present

## 2024-03-05 DIAGNOSIS — Z7902 Long term (current) use of antithrombotics/antiplatelets: Secondary | ICD-10-CM

## 2024-03-05 DIAGNOSIS — Z950 Presence of cardiac pacemaker: Secondary | ICD-10-CM | POA: Diagnosis not present

## 2024-03-05 DIAGNOSIS — Z9049 Acquired absence of other specified parts of digestive tract: Secondary | ICD-10-CM

## 2024-03-05 DIAGNOSIS — I272 Pulmonary hypertension, unspecified: Secondary | ICD-10-CM | POA: Diagnosis present

## 2024-03-05 DIAGNOSIS — I442 Atrioventricular block, complete: Secondary | ICD-10-CM | POA: Diagnosis present

## 2024-03-05 DIAGNOSIS — N183 Chronic kidney disease, stage 3 unspecified: Secondary | ICD-10-CM | POA: Diagnosis present

## 2024-03-05 DIAGNOSIS — K5909 Other constipation: Secondary | ICD-10-CM | POA: Diagnosis present

## 2024-03-05 DIAGNOSIS — Z7952 Long term (current) use of systemic steroids: Secondary | ICD-10-CM

## 2024-03-05 DIAGNOSIS — Z681 Body mass index (BMI) 19 or less, adult: Secondary | ICD-10-CM

## 2024-03-05 DIAGNOSIS — I480 Paroxysmal atrial fibrillation: Secondary | ICD-10-CM | POA: Diagnosis present

## 2024-03-05 DIAGNOSIS — Z7983 Long term (current) use of bisphosphonates: Secondary | ICD-10-CM

## 2024-03-05 DIAGNOSIS — N179 Acute kidney failure, unspecified: Secondary | ICD-10-CM | POA: Diagnosis not present

## 2024-03-05 DIAGNOSIS — K5732 Diverticulitis of large intestine without perforation or abscess without bleeding: Secondary | ICD-10-CM | POA: Diagnosis not present

## 2024-03-05 DIAGNOSIS — I5032 Chronic diastolic (congestive) heart failure: Secondary | ICD-10-CM | POA: Diagnosis present

## 2024-03-05 DIAGNOSIS — E785 Hyperlipidemia, unspecified: Secondary | ICD-10-CM | POA: Diagnosis present

## 2024-03-05 DIAGNOSIS — I495 Sick sinus syndrome: Secondary | ICD-10-CM | POA: Diagnosis present

## 2024-03-05 DIAGNOSIS — M316 Other giant cell arteritis: Secondary | ICD-10-CM | POA: Diagnosis present

## 2024-03-05 DIAGNOSIS — Z9889 Other specified postprocedural states: Secondary | ICD-10-CM

## 2024-03-05 DIAGNOSIS — N1832 Chronic kidney disease, stage 3b: Secondary | ICD-10-CM | POA: Diagnosis present

## 2024-03-05 DIAGNOSIS — Z1721 Progesterone receptor positive status: Secondary | ICD-10-CM

## 2024-03-05 DIAGNOSIS — I2510 Atherosclerotic heart disease of native coronary artery without angina pectoris: Secondary | ICD-10-CM | POA: Diagnosis present

## 2024-03-05 DIAGNOSIS — H9193 Unspecified hearing loss, bilateral: Secondary | ICD-10-CM | POA: Diagnosis present

## 2024-03-05 DIAGNOSIS — I130 Hypertensive heart and chronic kidney disease with heart failure and stage 1 through stage 4 chronic kidney disease, or unspecified chronic kidney disease: Secondary | ICD-10-CM | POA: Diagnosis present

## 2024-03-05 DIAGNOSIS — Z7901 Long term (current) use of anticoagulants: Secondary | ICD-10-CM | POA: Diagnosis not present

## 2024-03-05 DIAGNOSIS — M1990 Unspecified osteoarthritis, unspecified site: Secondary | ICD-10-CM | POA: Diagnosis present

## 2024-03-05 DIAGNOSIS — R1030 Lower abdominal pain, unspecified: Secondary | ICD-10-CM | POA: Diagnosis present

## 2024-03-05 DIAGNOSIS — K5792 Diverticulitis of intestine, part unspecified, without perforation or abscess without bleeding: Principal | ICD-10-CM

## 2024-03-05 DIAGNOSIS — Z170 Estrogen receptor positive status [ER+]: Secondary | ICD-10-CM

## 2024-03-05 DIAGNOSIS — Z86000 Personal history of in-situ neoplasm of breast: Secondary | ICD-10-CM

## 2024-03-05 DIAGNOSIS — G8929 Other chronic pain: Secondary | ICD-10-CM | POA: Diagnosis present

## 2024-03-05 DIAGNOSIS — Z96651 Presence of right artificial knee joint: Secondary | ICD-10-CM | POA: Diagnosis present

## 2024-03-05 DIAGNOSIS — Z803 Family history of malignant neoplasm of breast: Secondary | ICD-10-CM

## 2024-03-05 DIAGNOSIS — E440 Moderate protein-calorie malnutrition: Secondary | ICD-10-CM | POA: Diagnosis present

## 2024-03-05 DIAGNOSIS — I498 Other specified cardiac arrhythmias: Secondary | ICD-10-CM | POA: Diagnosis present

## 2024-03-05 DIAGNOSIS — I252 Old myocardial infarction: Secondary | ICD-10-CM

## 2024-03-05 DIAGNOSIS — Z888 Allergy status to other drugs, medicaments and biological substances status: Secondary | ICD-10-CM

## 2024-03-05 DIAGNOSIS — Z8674 Personal history of sudden cardiac arrest: Secondary | ICD-10-CM

## 2024-03-05 DIAGNOSIS — Z79899 Other long term (current) drug therapy: Secondary | ICD-10-CM

## 2024-03-05 DIAGNOSIS — R339 Retention of urine, unspecified: Secondary | ICD-10-CM | POA: Diagnosis not present

## 2024-03-05 DIAGNOSIS — Z974 Presence of external hearing-aid: Secondary | ICD-10-CM

## 2024-03-05 DIAGNOSIS — Z8679 Personal history of other diseases of the circulatory system: Secondary | ICD-10-CM

## 2024-03-05 DIAGNOSIS — Z809 Family history of malignant neoplasm, unspecified: Secondary | ICD-10-CM

## 2024-03-05 LAB — COMPREHENSIVE METABOLIC PANEL WITH GFR
ALT: 14 U/L (ref 0–44)
AST: 22 U/L (ref 15–41)
Albumin: 3.6 g/dL (ref 3.5–5.0)
Alkaline Phosphatase: 35 U/L — ABNORMAL LOW (ref 38–126)
Anion gap: 13 (ref 5–15)
BUN: 19 mg/dL (ref 8–23)
CO2: 26 mmol/L (ref 22–32)
Calcium: 9.5 mg/dL (ref 8.9–10.3)
Chloride: 95 mmol/L — ABNORMAL LOW (ref 98–111)
Creatinine, Ser: 1.08 mg/dL — ABNORMAL HIGH (ref 0.44–1.00)
GFR, Estimated: 48 mL/min — ABNORMAL LOW (ref >60)
Glucose, Bld: 106 mg/dL — ABNORMAL HIGH (ref 70–99)
Potassium: 4 mmol/L (ref 3.5–5.1)
Sodium: 134 mmol/L — ABNORMAL LOW (ref 135–145)
Total Bilirubin: 1.2 mg/dL (ref 0.0–1.2)
Total Protein: 5.6 g/dL — ABNORMAL LOW (ref 6.5–8.1)

## 2024-03-05 LAB — CBC
HCT: 33.7 % — ABNORMAL LOW (ref 36.0–46.0)
Hemoglobin: 9.9 g/dL — ABNORMAL LOW (ref 12.0–15.0)
MCH: 22.7 pg — ABNORMAL LOW (ref 26.0–34.0)
MCHC: 29.4 g/dL — ABNORMAL LOW (ref 30.0–36.0)
MCV: 77.1 fL — ABNORMAL LOW (ref 80.0–100.0)
Platelets: 323 K/uL (ref 150–400)
RBC: 4.37 MIL/uL (ref 3.87–5.11)
RDW: 17.5 % — ABNORMAL HIGH (ref 11.5–15.5)
WBC: 21.4 K/uL — ABNORMAL HIGH (ref 4.0–10.5)
nRBC: 0 % (ref 0.0–0.2)

## 2024-03-05 LAB — LIPASE, BLOOD: Lipase: 39 U/L (ref 11–51)

## 2024-03-05 MED ORDER — SODIUM CHLORIDE 0.9 % IV SOLN
2.0000 g | Freq: Once | INTRAVENOUS | Status: AC
  Administered 2024-03-05: 2 g via INTRAVENOUS
  Filled 2024-03-05: qty 20

## 2024-03-05 MED ORDER — OMEPRAZOLE 20 MG PO TBDD
40.0000 mg | DELAYED_RELEASE_TABLET | Freq: Every day | ORAL | Status: DC
  Administered 2024-03-06 – 2024-03-09 (×4): 40 mg via ORAL
  Filled 2024-03-05 (×4): qty 2

## 2024-03-05 MED ORDER — METOLAZONE 5 MG PO TABS
5.0000 mg | ORAL_TABLET | ORAL | Status: DC
Start: 2024-03-06 — End: 2024-03-07
  Filled 2024-03-05: qty 1

## 2024-03-05 MED ORDER — SPIRONOLACTONE 25 MG PO TABS
25.0000 mg | ORAL_TABLET | Freq: Every day | ORAL | Status: DC
Start: 2024-03-05 — End: 2024-03-07
  Administered 2024-03-05 – 2024-03-06 (×2): 25 mg via ORAL
  Filled 2024-03-05 (×2): qty 1

## 2024-03-05 MED ORDER — MORPHINE SULFATE (PF) 4 MG/ML IV SOLN
4.0000 mg | Freq: Once | INTRAVENOUS | Status: AC
  Administered 2024-03-05: 4 mg via INTRAVENOUS
  Filled 2024-03-05: qty 1

## 2024-03-05 MED ORDER — POTASSIUM CHLORIDE CRYS ER 20 MEQ PO TBCR
20.0000 meq | EXTENDED_RELEASE_TABLET | Freq: Two times a day (BID) | ORAL | Status: DC
  Administered 2024-03-05 – 2024-03-09 (×8): 20 meq via ORAL
  Filled 2024-03-05 (×8): qty 1

## 2024-03-05 MED ORDER — PREDNISONE 10 MG PO TABS
5.0000 mg | ORAL_TABLET | Freq: Every day | ORAL | Status: DC
  Administered 2024-03-06 – 2024-03-09 (×4): 5 mg via ORAL
  Filled 2024-03-05 (×4): qty 1

## 2024-03-05 MED ORDER — APIXABAN 2.5 MG PO TABS
2.5000 mg | ORAL_TABLET | Freq: Two times a day (BID) | ORAL | Status: DC
  Administered 2024-03-05 – 2024-03-09 (×8): 2.5 mg via ORAL
  Filled 2024-03-05 (×8): qty 1

## 2024-03-05 MED ORDER — HYDROMORPHONE HCL 1 MG/ML IJ SOLN: 0.5000 mg | INTRAMUSCULAR | Status: DC | PRN

## 2024-03-05 MED ORDER — METRONIDAZOLE 500 MG/100ML IV SOLN
500.0000 mg | Freq: Three times a day (TID) | INTRAVENOUS | Status: DC
  Administered 2024-03-06 – 2024-03-08 (×8): 500 mg via INTRAVENOUS
  Filled 2024-03-05 (×9): qty 100

## 2024-03-05 MED ORDER — IPRATROPIUM BROMIDE 0.03 % NA SOLN
2.0000 | Freq: Two times a day (BID) | NASAL | Status: DC
  Administered 2024-03-05 – 2024-03-09 (×7): 2 via NASAL
  Filled 2024-03-05: qty 30

## 2024-03-05 MED ORDER — ONDANSETRON HCL 4 MG/2ML IJ SOLN
4.0000 mg | Freq: Once | INTRAMUSCULAR | Status: AC
  Administered 2024-03-05: 4 mg via INTRAVENOUS
  Filled 2024-03-05: qty 2

## 2024-03-05 MED ORDER — ALBUTEROL SULFATE (2.5 MG/3ML) 0.083% IN NEBU: 3.0000 mL | INHALATION_SOLUTION | RESPIRATORY_TRACT | Status: DC | PRN

## 2024-03-05 MED ORDER — PROSIGHT PO TABS
1.0000 | ORAL_TABLET | Freq: Two times a day (BID) | ORAL | Status: DC
  Administered 2024-03-05 – 2024-03-09 (×7): 1 via ORAL
  Filled 2024-03-05 (×9): qty 1

## 2024-03-05 MED ORDER — METRONIDAZOLE 500 MG/100ML IV SOLN
500.0000 mg | Freq: Once | INTRAVENOUS | Status: AC
  Administered 2024-03-05: 500 mg via INTRAVENOUS
  Filled 2024-03-05: qty 100

## 2024-03-05 MED ORDER — NITROGLYCERIN 0.4 MG SL SUBL: 0.4000 mg | SUBLINGUAL_TABLET | SUBLINGUAL | Status: DC | PRN

## 2024-03-05 MED ORDER — SODIUM CHLORIDE 0.9 % IV BOLUS
500.0000 mL | Freq: Once | INTRAVENOUS | Status: AC
  Administered 2024-03-05: 500 mL via INTRAVENOUS

## 2024-03-05 MED ORDER — SODIUM CHLORIDE 0.9 % IV SOLN
2.0000 g | INTRAVENOUS | Status: DC
  Administered 2024-03-06 – 2024-03-08 (×3): 2 g via INTRAVENOUS
  Filled 2024-03-05 (×5): qty 20

## 2024-03-05 MED ORDER — POLYVINYL ALCOHOL 1.4 % OP SOLN
1.0000 [drp] | Freq: Every day | OPHTHALMIC | Status: DC
  Administered 2024-03-06 – 2024-03-09 (×4): 1 [drp] via OPHTHALMIC
  Filled 2024-03-05: qty 15

## 2024-03-05 MED ORDER — ROSUVASTATIN CALCIUM 10 MG PO TABS
10.0000 mg | ORAL_TABLET | Freq: Every day | ORAL | Status: DC
  Administered 2024-03-05 – 2024-03-09 (×5): 10 mg via ORAL
  Filled 2024-03-05 (×5): qty 1

## 2024-03-05 MED ORDER — FUROSEMIDE 40 MG PO TABS
40.0000 mg | ORAL_TABLET | Freq: Every day | ORAL | Status: DC
Start: 2024-03-05 — End: 2024-03-07
  Administered 2024-03-05 – 2024-03-06 (×2): 40 mg via ORAL
  Filled 2024-03-05 (×2): qty 1

## 2024-03-05 MED ORDER — IOHEXOL 300 MG/ML  SOLN
100.0000 mL | Freq: Once | INTRAMUSCULAR | Status: AC | PRN
  Administered 2024-03-05: 100 mL via INTRAVENOUS

## 2024-03-05 MED ORDER — GABAPENTIN 300 MG PO CAPS
300.0000 mg | ORAL_CAPSULE | Freq: Three times a day (TID) | ORAL | Status: DC
  Administered 2024-03-05 – 2024-03-09 (×11): 300 mg via ORAL
  Filled 2024-03-05 (×11): qty 1

## 2024-03-05 MED ORDER — ISOSORBIDE MONONITRATE ER 30 MG PO TB24
30.0000 mg | ORAL_TABLET | Freq: Every day | ORAL | Status: DC
  Administered 2024-03-05 – 2024-03-09 (×5): 30 mg via ORAL
  Filled 2024-03-05 (×5): qty 1

## 2024-03-05 MED ORDER — PREDNISONE 10 MG PO TABS
10.0000 mg | ORAL_TABLET | Freq: Every day | ORAL | Status: DC
  Administered 2024-03-05: 10 mg via ORAL
  Filled 2024-03-05: qty 1

## 2024-03-05 MED ORDER — BOOST / RESOURCE BREEZE PO LIQD CUSTOM
1.0000 | Freq: Three times a day (TID) | ORAL | Status: DC
  Administered 2024-03-05 – 2024-03-06 (×2): 1 via ORAL

## 2024-03-05 NOTE — H&P (Signed)
 History and Physical    Patient: Charlotte Glover FMW:[id number] DOB: 18-Sep-1927 DOA: 03/05/2024 DOS: the patient was seen and examined on 03/05/2024 PCP: Fernande Ophelia JINNY DOUGLAS, MD  Patient coming from: Home  Chief Complaint:  Chief Complaint  Patient presents with   Abdominal Pain   HPI: Charlotte Glover is a 89 y.o. female with medical history significant for hypertension, complete heart block (pacemaker), paroxysmal atrial fibrillation, CKD 3b and Giant Cell arteritis. The patient states that she awoke at 0300 with abdominal cramping that was located in her left flank. She states that she had a BM around 0700 and thought that it would make her feel better, but the patient continued and worsened. She denies fever, although she has had some chills. She took her temperature at home and it was 94.7. She has had diverticulitis in the past that was associated with hemorrhage.   In the ED the patient is found to have WBC of 21.4, Hemoglobin at baseline of 9.9, Creatinine at baseline 1.08.  CT abdomen and pelvis has demonstrated acute uncomplicated diverticulitis.  The patient is being admitted and is receiving IV ceftriaxone  and flagyl . Blood cultures x 2 have been ordered.   Review of Systems: As mentioned in the history of present illness. All other systems reviewed and are negative. Past Medical History:  Diagnosis Date   (HFpEF) heart failure with preserved ejection fraction (HCC) 06/21/2020   a.) TTE 06/21/2020: EF >55%, RAE, G1DD   Amaurosis fugax of left eye    Arthritis    Atrial fibrillation (HCC)    a.) Dx'd in 2009; b.) CHA2DS2-VASc = 6 (age x 2, sex, CHF, HTN, prior MI); c.) s/p PVI 09/31/2013; DCCV (200 J x 1) 04/02/2012, repeat PVI with roof line, coronary sinus isolation, and CFAE ablation 01/02/2013; DCCV 01/18/2013 (200 J x 1); AV nodal ablation 05/28/2014;  d.) rate/rhythm maintained without pharmacological intervention; chronically anticoagulated using rivaroxaban     Cardiac arrest (HCC) 10/2009   a.) witnessed arrest at home; husband performed CPR; ROSC achieved in the field. Admitted for workup --> etiology of CV event undetermined; felt to be secondary to electrolyte abnormalities.   Cardiac murmur    CHB (complete heart block) (HCC)    a.) s/p PPM 05/28/2014; b.) CRT-P upgrade 07/13/2016.   Chickenpox    CKD (chronic kidney disease), stage III (HCC)    Coronary artery disease    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/13/2013   a.) grade II; ER/PR (+)   Dyslipidemia 09/15/2023   Dyspnea    Edema    GERD (gastroesophageal reflux disease)    Granulomatous disease (HCC)    a.) spleen   Hiatal hernia    HLD (hyperlipidemia)    HOH (hard of hearing)    Hypertension 01/23/2012   Jackhammer esophagus    Long term current use of antithrombotics/antiplatelets    a.) rivaroxaban    Melanoma of lower leg, left (HCC)    Meningioma, multiple (HCC) 07/14/2019   a.) MRI brain 07/14/2019: 1.2 x 1.3 x 1.3 cm midline posterior fossa meningioma inferior to the cerebellar vertex; 7 x 3 mm dural based meningioma along frontal convexity   NSTEMI (non-ST elevated myocardial infarction) (HCC) 08/2009   Presence of permanent cardiac pacemaker    Pulmonary HTN (HCC) 05/08/2016   a.) R/LHC 05/08/2016: EF >55, LVEDP 15, mean PA 38, AO sat 97, RVSP 50, CO 3 L/min   S/P ablation of atrial fibrillation    Shingles  SSS (sick sinus syndrome) (HCC)    a.) CRT-P in place   Valvular heart disease 02/08/2012   a.) cMRI 02/08/2012: EF 50-55, mild-mod AR, mod MR; b.) TEE 02/26/2012: EF >55, mild MR/AR/TR; c.) R/LHC 05/08/2016: sev MR; d.) TEE 06/14/2016: EF >55, sev MR, mod AR/TR; e.) TTE 10/05/2016: EF >55, triv PR, mod AR/MR/TR; f.) TTE 04/22/2018: EF >55, mild MR/TR/PR, mod AR; g.) TTE 11/27/2019: EF 50, mild PR, mod AR/MR/TR; h.) TTE 06/21/2020: EF >55, triv PR, mod AR/MR/TR   Wears hearing aid in both ears    Past Surgical History:  Procedure Laterality  Date   ANTERIOR VITRECTOMY Left 03/20/2017   Procedure: ANTERIOR VITRECTOMY;  Surgeon: Mittie Gaskin, MD;  Location: [facility];  Service: Ophthalmology;  Laterality: Left;  IVA TOPICAL LEFT   ARTERY BIOPSY Right 04/08/2019   Procedure: BIOPSY TEMPORAL ARTERY;  Surgeon: Marea Selinda RAMAN, MD;  Location: [facility];  Service: Vascular;  Laterality: Right;   ATRIAL FIBRILLATION ABLATION N/A 03/24/2012   Procedure: ATRIAL FIBRILLATION ABLATION (PVI); Location: [facility]   ATRIAL FIBRILLATION ABLATION N/A 01/02/2013   Procedure: ATRIAL FIBRILLATION ABLATION (PVI, roof line, coronary sinus siolation, CFAE ablation); Location: [facility]   AV NODE ABLATION N/A 05/26/2014   Procedure: AV NODE ABLATION; Location: [facility]   BIV PACEMAKER INSERTION CRT-P N/A 07/13/2016   Procedure: CRT-P BiV PACEMAKER UPGRADE; Location: [facility]   BREAST BIOPSY Right 2015   + for DCIS    BREAST SURGERY     CARDIAC CATHETERIZATION Bilateral 05/08/2016   Procedure: Right/Left Heart Cath and Coronary Angiography;  Surgeon: Marsa Dooms, MD;  Location: [facility];  Service: Cardiovascular;  Laterality: Bilateral;   CARDIOVERSION N/A 04/02/2012   Procedure: CARDIOVERSION; Location: [facility]   CARDIOVERSION N/A 01/15/2013   Procedure: CARDIOVERSION; Location: [facility]   CATARACT EXTRACTION W/PHACO Left 03/20/2017   Procedure: IOL EXCHANGE;  Surgeon: Mittie Gaskin, MD;  Location: [facility];  Service: Ophthalmology;  Laterality: Left;   CHOLECYSTECTOMY     COLONOSCOPY     patient reports several   COLONOSCOPY N/A 09/11/2023   Procedure: COLONOSCOPY;  Surgeon: Jinny Carmine, MD;  Location: [facility];  Service: Endoscopy;  Laterality: N/A;   DILATION AND CURETTAGE OF UTERUS     ESOPHAGOGASTRODUODENOSCOPY     ESOPHAGOGASTRODUODENOSCOPY (EGD) WITH PROPOFOL  N/A 12/13/2020   Procedure: ESOPHAGOGASTRODUODENOSCOPY (EGD) WITH PROPOFOL ;  Surgeon: Maryruth Ole DASEN, MD;  Location: [facility];  Service:  Endoscopy;  Laterality: N/A;   KNEE ARTHROPLASTY Right 12/25/2021   Procedure: COMPUTER ASSISTED TOTAL KNEE ARTHROPLASTY;  Surgeon: Mardee Lynwood SQUIBB, MD;  Location: [facility];  Service: Orthopedics;  Laterality: Right;   PACEMAKER INSERTION N/A 05/28/2014   Procedure: PACEMAKER INSERTION (dual chamber); Location: [facility]   PERIPHERAL IRIDOTOMY Left 03/20/2017   Procedure: PERIPHERAL IRIDECTOMY;  Surgeon: Mittie Gaskin, MD;  Location: [facility];  Service: Ophthalmology;  Laterality: Left;   ROTATOR CUFF REPAIR Left    TEE WITHOUT CARDIOVERSION N/A 05/16/2016   Procedure: TRANSESOPHAGEAL ECHOCARDIOGRAM (TEE);  Surgeon: Vinie DELENA Jude, MD;  Location: [facility];  Service: Cardiovascular;  Laterality: N/A;   TONSILLECTOMY     uterine polyp     Social History:  reports that she has never smoked. She has never used smokeless tobacco. She reports current alcohol  use of about 7.0 standard drinks of alcohol  per week. She reports that she does not use drugs.  Allergies  Allergen Reactions   Amiodarone Other (See Comments)    Bradycardia and sinus pauses   Baclofen Other (  See Comments)    Double vision  Vision disturbances  Double vision    Vision disturbances   Metoprolol  Rash   Pantoprazole  Rash   Silicone Rash    Tape causes a skin rash.  ECG electrodes cause skin irritation when left on for prolonged period of time.   Tape Rash    Family History  Problem Relation Age of Onset   Breast cancer Mother    Cancer Sister    Cancer Brother    Cancer Daughter    Breast cancer Daughter    Cancer Son     Prior to Admission medications   Medication Sig Start Date End Date Taking? Authorizing Provider  acetaminophen  (TYLENOL ) 500 MG tablet Take 1,000 mg by mouth every 6 (six) hours as needed for pain or fever.   Yes [provider]  albuterol  (VENTOLIN  HFA) 108 (90 Base) MCG/ACT inhaler Inhale 2 puffs into the lungs every 4 (four) hours as needed. 07/02/23  Yes Bernardino Ditch, NP  alendronate (FOSAMAX) 70 MG tablet Take 70 mg by mouth once a week. 05/14/23 05/13/24 Yes [provider]  ELIQUIS  2.5 MG TABS tablet SMARTSIG:1 Tablet(s) By Mouth Every 12 Hours   Yes [provider]  esomeprazole (NEXIUM) 40 MG capsule Take 40 mg by mouth daily.   Yes [provider]  furosemide  (LASIX ) 40 MG tablet Take 40 mg by mouth 2 (two) times daily.  03/30/16  Yes [provider]  gabapentin  (NEURONTIN ) 300 MG capsule Take 300 mg by mouth 3 (three) times daily.   Yes [provider]  isosorbide  mononitrate (IMDUR ) 30 MG 24 hr tablet Take 30 mg by mouth daily. 04/24/23 04/23/24 Yes [provider]  KLOR-CON  M10 10 MEQ tablet Take 2 tablets by mouth 2 (two) times daily. 01/14/23  Yes [provider]  metolazone  (ZAROXOLYN ) 5 MG tablet Take 5 mg by mouth daily. Takes on Friday   Yes [provider]  Multiple Vitamins-Minerals (PRESERVISION AREDS 2) CAPS Take 1 capsule by mouth 2 (two) times daily.   Yes [provider]  nitroGLYCERIN  (NITROSTAT ) 0.4 MG SL tablet Place 0.4 mg under the tongue every 5 (five) minutes x 2 doses as needed for chest pain.  04/01/13  Yes [provider]  Polyvinyl Alcohol -Povidone (REFRESH OP) Apply 1 drop to eye daily as needed (dry eyes).   Yes [provider]  predniSONE  (DELTASONE ) 5 MG tablet Take 5 mg by mouth daily with breakfast.   Yes [provider]  rosuvastatin  (CRESTOR ) 10 MG tablet Take 10 mg by mouth daily.   Yes [provider]  senna (SENOKOT) 8.6 MG TABS tablet Take 1 tablet by mouth daily as needed for mild constipation.   Yes [provider]  spironolactone  (ALDACTONE ) 25 MG tablet Take 25 mg by mouth 2 (two) times daily.   Yes [provider]  traMADol  (ULTRAM ) 50 MG tablet Take 1 tablet (50 mg total) by mouth every 12 (twelve) hours as needed for severe pain (pain score 7-10). 02/24/24 05/24/24 Yes Lateef,  Bilal, MD  triamcinolone ointment (KENALOG) 0.1 % Apply 1 application  topically as needed.   Yes [provider]  ipratropium (ATROVENT ) 0.03 % nasal spray Place into the nose. 07/02/23 02/11/24  [provider]  Spacer/Aero-Holding Chambers (AEROCHAMBER MV) inhaler Use as instructed 07/02/23   Bernardino Ditch, NP    Physical Exam: Vitals:   03/05/24 1527 03/05/24 1558 03/05/24 1740 03/05/24 2035  BP:  135/71 (!) 170/71 ROLLEN)  169/83  Pulse:  73 72 73  Resp:  16 16 19   Temp: 98.5 F (36.9 C) 98 F (36.7 C) 97.6 F (36.4 C) 98.2 F (36.8 C)  TempSrc: Oral Axillary  Oral  SpO2:  96% 100% 100%  Weight:      Height:       Exam:  Constitutional:  The patient is awake, alert, and oriented x 3. No acute distress. Eyes:  pupils and irises appear normal Normal lids and conjunctivae ENMT:  grossly normal hearing  Lips appear normal external ears, nose appear normal Oropharynx: mucosa, tongue,posterior pharynx appear normal Neck:  neck appears normal, no masses, normal ROM, supple no thyromegaly Respiratory:  No increased work of breathing. No wheezes, rales, or rhonchi No tactile fremitus Cardiovascular:  Regular rate and rhythm No murmurs, ectopy, or gallups. No lateral PMI. No thrills. Abdomen:  Abdomen is soft, but slightly distended. Positive for tenderness in the left lower quadrant that is worse with palpation. No hernias, masses, or organomegaly Hypooactive bowel sounds.  Musculoskeletal:  No cyanosis, clubbing, or edema Skin:  No rashes, lesions, ulcers palpation of skin: no induration or nodules Neurologic:  CN 2-12 intact Sensation all 4 extremities intact Psychiatric:  Mental status Mood, affect appropriate Orientation to person, place, time  judgment and insight appear intact  Data Reviewed:  CBC BMP CT abdomen and pelvis  Assessment and Plan: Atrial fibrillation (HCC) Rate is controlled without rate limiting medication. She is  receiving eliquis  at 2.5 mg bid for stroke prophylaxis. Monitor on telemetry.  Biventricular cardiac pacemaker in situ For complete heart block.  CKD (chronic kidney disease) stage 3, GFR 30-59 ml/min (HCC) Creatinine is at baseline at 1.08. Monitor creatinine, volume status, and electrolytes.  Diverticulitis large intestine Acute uncomplicated diverticulitis on CT abdomen and pelvis. Blood cultures x 2 have been drawn. The patient is receiving Ceftriaxone  and Flagyl . She is on a clear liquid diet.  Dyslipidemia Continue rosuvastatin  as at home.  GCA (giant cell arteritis) (HCC) Noted. Will continue prednisone  at 5 mg daily.  Long term current use of systemic steroids Patient's steroids have been able to be tapered down to 5 mg of prednisone  daily.  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 Due to complete heart block.  S/P ablation of atrial fibrillation Pt's heart rate is controlled without rate controlling medications. She is continued on eliquis  at 2.5 mg bid for stroke prophylaxis.   Advance Care Planning: Full Code  Consults: None  Family Communication: None available  Severity of Illness: The appropriate patient status for this patient is INPATIENT. Inpatient status is judged to be reasonable and necessary in order to provide the required intensity of service to ensure the patient's safety. The patient's presenting symptoms, physical exam findings, and initial radiographic and laboratory data in the context of their chronic comorbidities is felt to place them at high risk for further clinical deterioration. Furthermore, it is not anticipated that the patient will be medically stable for discharge from the hospital within 2 midnights of admission.   * I certify that at the point of admission it is my clinical judgment that the patient will require inpatient hospital care spanning beyond 2 midnights from the point of admission due to high  intensity of service, high risk for further deterioration and high frequency of surveillance required.*  Author: Tyresa Prindiville, DO 03/05/2024 8:43 PM  For on call review [email or web address].

## 2024-03-05 NOTE — Assessment & Plan Note (Signed)
 Patient's steroids have been able to be tapered down to 5 mg of prednisone  daily.

## 2024-03-05 NOTE — ED Triage Notes (Signed)
 First Nurse Note:  Pt via [facility] from home. Pt c/o lower side pain that started at 3:00am. Pt is A&Ox4 and NAD 98.5 oral  72 HR 128/56 BP  95% on RA 34 ETCO2

## 2024-03-05 NOTE — ED Provider Notes (Signed)
 [facility] Provider Note    Event Date/Time   First MD Initiated Contact with Patient 03/05/24 1155     (approximate)   History   Abdominal Pain   HPI  Charlotte Glover is a 89 year old female with history of CHF, A-fib, HTN presenting to the emergency department for evaluation of abdominal pain.  Around 3 AM this morning, patient had onset of lower abdominal pain with associated episodes of vomiting.  Did have a bowel movement earlier today.  Pain is primarily in her left lower quadrant.  Does report a history of diverticulitis and says this feels somewhat similar.  History of cholecystectomy.  Reviewed discharge summary from 09/12/2023.  At that time she presented to the ER with rectal bleeding requiring transfusion of 2 units PRBCs.  Suspected recurrent diverticular bleed at that time.     Physical Exam   Triage Vital Signs: ED Triage Vitals  Encounter Vitals Group     BP 03/05/24 1116 (!) 131/49     Girls Systolic BP Percentile --      Girls Diastolic BP Percentile --      Boys Systolic BP Percentile --      Boys Diastolic BP Percentile --      Pulse Rate 03/05/24 1116 71     Resp 03/05/24 1116 18     Temp 03/05/24 1116 99 F (37.2 C)     Temp Source 03/05/24 1116 Oral     SpO2 03/05/24 1116 99 %     Weight 03/05/24 1117 130 lb (59 kg)     Height 03/05/24 1117 5' 9 (1.753 m)     Head Circumference --      Peak Flow --      Pain Score 03/05/24 1117 6     Pain Loc --      Pain Education --      Exclude from Growth Chart --     Most recent vital signs: Vitals:   03/05/24 1558 03/05/24 1740  BP: 135/71 (!) 170/71  Pulse: 73 72  Resp: 16 16  Temp: 98 F (36.7 C) 97.6 F (36.4 C)  SpO2: 96% 100%     General: Awake, interactive  CV:  Regular rate, good peripheral perfusion.  Resp:  Unlabored respirations.  Abd:  Nondistended, soft, focal tenderness to palpation in the left lower quadrant  Neuro:  Symmetric facial movement,  fluid speech   ED Results / Procedures / Treatments   Labs (all labs ordered are listed, but only abnormal results are displayed) Labs Reviewed  COMPREHENSIVE METABOLIC PANEL WITH GFR - Abnormal; Notable for the following components:      Result Value   Sodium 134 (*)    Chloride 95 (*)    Glucose, Bld 106 (*)    Creatinine, Ser 1.08 (*)    Total Protein 5.6 (*)    Alkaline Phosphatase 35 (*)    GFR, Estimated 48 (*)    All other components within normal limits  CBC - Abnormal; Notable for the following components:   WBC 21.4 (*)    Hemoglobin 9.9 (*)    HCT 33.7 (*)    MCV 77.1 (*)    MCH 22.7 (*)    MCHC 29.4 (*)    RDW 17.5 (*)    All other components within normal limits  CULTURE, BLOOD (ROUTINE X 2)  CULTURE, BLOOD (ROUTINE X 2)  LIPASE, BLOOD  URINALYSIS, ROUTINE W REFLEX MICROSCOPIC     EKG EKG  independently reviewed and interpreted by myself demonstrates:    RADIOLOGY Imaging independently reviewed and interpreted by myself demonstrates:   Formal Radiology Read:  CT ABDOMEN PELVIS W CONTRAST Result Date: 03/05/2024 CLINICAL DATA:  LLQ abdominal pain. EXAM: CT ABDOMEN AND PELVIS WITH CONTRAST TECHNIQUE: Multidetector CT imaging of the abdomen and pelvis was performed using the standard protocol following bolus administration of intravenous contrast. RADIATION DOSE REDUCTION: This exam was performed according to the departmental dose-optimization program which includes automated exposure control, adjustment of the mA and/or kV according to patient size and/or use of iterative reconstruction technique. CONTRAST:  OMNIPAQUE  IOHEXOL  300 MG/ML  SOLN COMPARISON:  Nuclear medicine PET scan from 01/14/2018. FINDINGS: Lower chest: There are dependent atelectatic changes in the visualized lung bases. No overt consolidation. No pleural effusion. The heart is normal in size. No pericardial effusion. There are coronary artery atherosclerotic calcifications, in keeping with  coronary artery disease. Partially seen pacemaker wires. Hepatobiliary: The liver is normal in size. Non-cirrhotic configuration. No suspicious mass. These is mild diffuse hepatic steatosis. No intrahepatic or extrahepatic bile duct dilation. Gallbladder is surgically absent. Pancreas: Unremarkable. No pancreatic ductal dilatation or surrounding inflammatory changes. Spleen: Normal in size. There are subcentimeter calcified granulomas in the spleen. No other focal lesion. Adrenals/Urinary Tract: Adrenal glands are unremarkable. No suspicious renal lesion. There is a 1.9 x 2.8 cm sinus cyst in the right kidney lower pole. Nephroureterolithiasis or obstructive uropathy on either side. Unremarkable urinary bladder. Stomach/Bowel: There is a small sliding hiatal hernia. There is short segment of colon (approximately 5 cm), at the level of junction of descending colon and proximal sigmoid colon, exhibiting mild irregular wall thickening and pericolonic fat stranding on the background of several diverticula, compatible with acute uncomplicated diverticulitis. No walled off abscess or loculated collection. No pneumoperitoneum. No disproportionate dilation of small or large bowel loops to suggest bowel obstruction. The appendix is unremarkable. Vascular/Lymphatic: No ascites or pneumoperitoneum. No abdominal or pelvic lymphadenopathy, by size criteria. No aneurysmal dilation of the major abdominal arteries. There are mild peripheral atherosclerotic vascular calcifications of the aorta and its major branches. Reproductive: The uterus is unremarkable. No large adnexal mass. Other: There is a small fat containing left inguinal hernia. There is also small right femoral hernia containing ascitic fluid. The soft tissues and abdominal wall are otherwise unremarkable. Musculoskeletal: No suspicious osseous lesions. There are mild - moderate multilevel degenerative changes in the visualized spine. IMPRESSION: 1. Findings compatible  with acute uncomplicated diverticulitis of the junction of descending colon and proximal sigmoid colon. No walled off abscess or loculated collection. No pneumoperitoneum. 2. Multiple other nonacute observations, as described above. Aortic Atherosclerosis (ICD10-I70.0). Electronically Signed   By: Ree Molt M.D.   On: 03/05/2024 15:02    PROCEDURES:  Critical Care performed: No  Procedures   MEDICATIONS ORDERED IN ED: Medications  furosemide  (LASIX ) tablet 40 mg (has no administration in time range)  isosorbide  mononitrate (IMDUR ) 24 hr tablet 30 mg (has no administration in time range)  metolazone  (ZAROXOLYN ) tablet 5 mg (has no administration in time range)  nitroGLYCERIN  (NITROSTAT ) SL tablet 0.4 mg (has no administration in time range)  rosuvastatin  (CRESTOR ) tablet 10 mg (has no administration in time range)  spironolactone  (ALDACTONE ) tablet 25 mg (has no administration in time range)  predniSONE  (DELTASONE ) tablet 10 mg (has no administration in time range)  omeprazole  disintegrating tablet 40 mg (has no administration in time range)  apixaban  (ELIQUIS ) tablet 2.5 mg (has no administration in time  range)  gabapentin  (NEURONTIN ) capsule 300 mg (has no administration in time range)  potassium chloride  SA (KLOR-CON  M) CR tablet 20 mEq (has no administration in time range)  multivitamin (PROSIGHT) tablet 1 tablet (has no administration in time range)  albuterol  (PROVENTIL ) (2.5 MG/3ML) 0.083% nebulizer solution 3 mL (has no administration in time range)  ipratropium (ATROVENT ) 0.03 % nasal spray 2 spray (has no administration in time range)  artificial tears ophthalmic solution 1 drop (has no administration in time range)  sodium chloride  0.9 % bolus 500 mL (0 mLs Intravenous Stopped 03/05/24 1446)  ondansetron  (ZOFRAN ) injection 4 mg (4 mg Intravenous Given 03/05/24 1215)  morphine  (PF) 4 MG/ML injection 4 mg (4 mg Intravenous Given 03/05/24 1215)  iohexol  (OMNIPAQUE ) 300 MG/ML  solution 100 mL (100 mLs Intravenous Contrast Given 03/05/24 1444)  morphine  (PF) 4 MG/ML injection 4 mg (4 mg Intravenous Given 03/05/24 1524)  cefTRIAXone  (ROCEPHIN ) 2 g in sodium chloride  0.9 % 100 mL IVPB (2 g Intravenous New Bag/Given 03/05/24 1533)  metroNIDAZOLE  (FLAGYL ) IVPB 500 mg (500 mg Intravenous New Bag/Given 03/05/24 1530)     IMPRESSION / MDM / ASSESSMENT AND PLAN / ED COURSE  I reviewed the triage vital signs and the nursing notes.  Differential diagnosis includes, but is not limited to, diverticulitis with consideration for complications including abscess, perforation, colitis, bowel obstruction, other acute intra-abdominal process  Patient's presentation is most consistent with acute presentation with potential threat to life or bodily function.  89 year old female presenting to the emergency department for evaluation of abdominal pain.  Stable vitals on presentation.  Labs significant leukocytosis WC of 21.4, stable anemia.  CMP with stable renal impairment.  Concerning physical exam, will obtain CT to further evaluate.  Ordered for IV fluids, morphine , Zofran  for symptomatic relief.  Clinical Course as of 03/05/24 1913  Thu Mar 05, 2024  1513 CT demonstrated findings of uncomplicated diverticulitis without associated abscess or fluid collection.  Patient reassessed.  Reports some improvement with morphine  but some persistent pain.  Given her age, refractory pain and degree of leukocytosis, do think admission for IV antibiotics and symptomatic management is reasonable.  Will reach out to hospitalist team. [NR]  1525 Case discussed with hospitalist team. They will evaluate for anticipated admission.  [NR]    Clinical Course User Index [NR] Levander Slate, MD     FINAL CLINICAL IMPRESSION(S) / ED DIAGNOSES   Final diagnoses:  Diverticulitis     Rx / DC Orders   ED Discharge Orders     None        Note:  This document was prepared using Dragon voice recognition  software and may include unintentional dictation errors.   Levander Slate, MD 03/05/24 [phone]

## 2024-03-05 NOTE — ED Triage Notes (Signed)
 Patient to ED via [facility] form home for LLQ abd pain. Started around 0300 with some vomiting. NAD noted. Pain worse with movement.

## 2024-03-05 NOTE — Assessment & Plan Note (Signed)
 Pt's heart rate is controlled without rate controlling medications. She is continued on eliquis  at 2.5 mg bid for stroke prophylaxis.

## 2024-03-05 NOTE — Assessment & Plan Note (Signed)
 Due to complete heart block.

## 2024-03-05 NOTE — Assessment & Plan Note (Signed)
 Continue rosuvastatin  as at home.

## 2024-03-05 NOTE — Assessment & Plan Note (Signed)
 Rate is controlled without rate limiting medication. She is receiving eliquis  at 2.5 mg bid for stroke prophylaxis. Monitor on telemetry.

## 2024-03-05 NOTE — Assessment & Plan Note (Signed)
 Creatinine is at baseline at 1.08. Monitor creatinine, volume status, and electrolytes.

## 2024-03-05 NOTE — Assessment & Plan Note (Signed)
 For complete heart block.

## 2024-03-05 NOTE — Assessment & Plan Note (Signed)
 Acute uncomplicated diverticulitis on CT abdomen and pelvis. Blood cultures x 2 have been drawn. The patient is receiving Ceftriaxone  and Flagyl . She is on a clear liquid diet.

## 2024-03-05 NOTE — Assessment & Plan Note (Signed)
 Noted. Will continue prednisone  at 5 mg daily.

## 2024-03-06 DIAGNOSIS — E440 Moderate protein-calorie malnutrition: Secondary | ICD-10-CM | POA: Insufficient documentation

## 2024-03-06 DIAGNOSIS — K5732 Diverticulitis of large intestine without perforation or abscess without bleeding: Secondary | ICD-10-CM | POA: Diagnosis not present

## 2024-03-06 MED ORDER — TRAMADOL HCL 50 MG PO TABS: 50.0000 mg | ORAL_TABLET | Freq: Two times a day (BID) | ORAL | Status: DC | PRN

## 2024-03-06 MED ORDER — ACETAMINOPHEN 500 MG PO TABS: 1000.0000 mg | ORAL_TABLET | Freq: Three times a day (TID) | ORAL | Status: DC | PRN

## 2024-03-06 MED ORDER — SENNA 8.6 MG PO TABS
1.0000 | ORAL_TABLET | Freq: Two times a day (BID) | ORAL | Status: DC
  Administered 2024-03-06 – 2024-03-07 (×2): 8.6 mg via ORAL
  Filled 2024-03-06 (×5): qty 1

## 2024-03-06 MED ORDER — DOCUSATE SODIUM 100 MG PO CAPS
100.0000 mg | ORAL_CAPSULE | Freq: Two times a day (BID) | ORAL | Status: DC
Start: 2024-03-06 — End: 2024-03-09
  Administered 2024-03-06 – 2024-03-07 (×2): 100 mg via ORAL
  Filled 2024-03-06 (×5): qty 1

## 2024-03-06 MED ORDER — ONDANSETRON 4 MG PO TBDP: 4.0000 mg | ORAL_TABLET | Freq: Three times a day (TID) | ORAL | Status: DC | PRN

## 2024-03-06 MED ORDER — ENSURE PLUS HIGH PROTEIN PO LIQD
237.0000 mL | Freq: Two times a day (BID) | ORAL | Status: DC
  Administered 2024-03-06 – 2024-03-08 (×4): 237 mL via ORAL

## 2024-03-06 MED ORDER — ONDANSETRON HCL 4 MG/2ML IJ SOLN: 4.0000 mg | Freq: Four times a day (QID) | INTRAMUSCULAR | Status: DC | PRN

## 2024-03-06 MED ORDER — POLYETHYLENE GLYCOL 3350 17 G PO PACK
17.0000 g | PACK | Freq: Two times a day (BID) | ORAL | Status: DC
  Administered 2024-03-06: 17 g via ORAL
  Filled 2024-03-06 (×4): qty 1

## 2024-03-06 NOTE — Progress Notes (Addendum)
 Initial Nutrition Assessment  DOCUMENTATION CODES:   Non-severe (moderate) malnutrition in context of chronic illness  INTERVENTION:   -Advance to soft diet per discussion with MD -D/c Boost Breeze po TID, each supplement provides 250 kcal and 9 grams of protein  -Ensure Plus High Protein po BID, each supplement provides 350 kcal and 20 grams of protein   NUTRITION DIAGNOSIS:   Moderate Malnutrition related to chronic illness as evidenced by mild fat depletion, moderate fat depletion, mild muscle depletion, moderate muscle depletion, percent weight loss.  GOAL:   Patient will meet greater than or equal to 90% of their needs  MONITOR:   PO intake, Supplement acceptance, Diet advancement  REASON FOR ASSESSMENT:   Malnutrition Screening Tool    ASSESSMENT:   Pt with medical history significant for hypertension, complete heart block (pacemaker), paroxysmal atrial fibrillation, CKD 3b and Giant Cell arteritis.  Pt admitted with a-fib and diverticulitis.   Reviewed I/O's: +120 ml x 24 hours  Per H&P, pt with acute uncomplicated diverticulitis on CT with abdomen and pelvis.   Spoke with pt at bedside, who was pleasant and in good spirits today. Pt with complaints, other than feeling cold; RD adjusted thermostat per her request.   Pt endorses mild abdominal pain (which has improved), but mainly complaints of limitations of clear liquid diet and having to pee from drinking so much fluid. Pt shares that she had a good appetite PTA, usually consuming 2 meals per day. Pt reports she was able to tolerate solid foods PTA and thinks she would be able to consume something more filling than clear liquids.   Pt denies any weight loss, however, pt has experienced a 13.3% wt loss over the past 6 months, which is significant for time frame.   Discussed importance of good meal and supplement intake to promote healing. Pt amenable to supplements. RD reached out to MD regarding possibility for  diet advancement.   Medications reviewed and include lasix , neurontin , potassium chloride , prednisone , and aldactone .   Labs reviewed: Na: 134.    NUTRITION - FOCUSED PHYSICAL EXAM:  Flowsheet Row Most Recent Value  Orbital Region Mild depletion  Upper Arm Region Moderate depletion  Thoracic and Lumbar Region No depletion  Buccal Region Mild depletion  Temple Region Moderate depletion  Clavicle Bone Region Mild depletion  Clavicle and Acromion Bone Region Mild depletion  Scapular Bone Region Mild depletion  Dorsal Hand Moderate depletion  Patellar Region Moderate depletion  Anterior Thigh Region Moderate depletion  Posterior Calf Region Moderate depletion  Edema (RD Assessment) None  Hair Reviewed  Eyes Reviewed  Mouth Reviewed  Skin Reviewed  Nails Reviewed    Diet Order:   Diet Order             Diet clear liquid Room service appropriate? Yes; Fluid consistency: Thin  Diet effective now                   EDUCATION NEEDS:   Education needs have been addressed  Skin:  Skin Assessment: Skin Integrity Issues: Skin Integrity Issues:: Other (Comment) Other: vascular ulcers to bilateral pretibial  Last BM:  03/05/24  Height:   Ht Readings from Last 1 Encounters:  03/05/24 5' 9 (1.753 m)    Weight:   Wt Readings from Last 1 Encounters:  03/05/24 59 kg    Ideal Body Weight:  65.9 kg  BMI:  Body mass index is 19.2 kg/m.  Estimated Nutritional Needs:   Kcal:  1700-1900  Protein:  90-105 grams  Fluid:  1.7-1.9 L    Margery ORN, RD, LDN, CDCES Registered Dietitian III Certified Diabetes Care and Education Specialist If unable to reach this RD, please use RD Inpatient group chat on secure chat between hours of 8am-4 pm daily

## 2024-03-06 NOTE — Progress Notes (Signed)
  PROGRESS NOTE    Charlotte Glover  FMW:[id number] DOB: 1927-11-02 DOA: 03/05/2024 PCP: Charlotte Ophelia JINNY DOUGLAS, MD  154A/[id number]-AA  LOS: 1 day   Brief hospital course:   Assessment & Plan: Charlotte Glover is a 89 y.o. female with medical history significant for hypertension, complete heart block (pacemaker), paroxysmal atrial fibrillation, CKD 3b and Giant Cell arteritis. The patient states that she awoke at 0300 with abdominal cramping that was located in her left flank.    Acute uncomplicated diverticulitis  --cont ceftriaxone  and Flagyl  --advance to regular diet  Atrial fibrillation (HCC) S/P ablation of atrial fibrillation Rate is controlled without rate limiting medication.  --cont Eliquis    Biventricular cardiac pacemaker in situ For complete heart block.   CKD (chronic kidney disease) stage 3a, GFR 30-59 ml/min (HCC) Creatinine is at baseline at 1.08.    Dyslipidemia --cont statin   Long term current use of systemic steroids GCA (giant cell arteritis) (HCC) Patient's steroids have been able to be tapered down to 5 mg of prednisone  daily. --cont home prendisone   Chronic pain on chronic opioids --cont home tramadol   Chronic constipation --cont home bowel regimen   DVT prophylaxis: On:Eliquis  Code Status: Full code  Family Communication: son updated at bedside today Level of care: Med-Surg Dispo:   The patient is from: home Anticipated d/c is to: home Anticipated d/c date is: tomorrow   Subjective and Interval History:  Pt reported LLQ pain much improved.  Tolerating regular diet.   Objective: Vitals:   03/05/24 2035 03/06/24 0814 03/06/24 1624 03/06/24 1954  BP: (!) 169/83 (!) 153/67 (!) 113/92 136/77  Pulse: 73 71 71 70  Resp: 19 17 18 16   Temp: 98.2 F (36.8 C) 97.8 F (36.6 C) 97.6 F (36.4 C) 98.2 F (36.8 C)  TempSrc: Oral Oral    SpO2: 100% 98% 100% 100%  Weight:      Height:        Intake/Output Summary (Last 24 hours) at  03/06/2024 2007 Last data filed at 03/06/2024 1900 Gross per 24 hour  Intake 600 ml  Output --  Net 600 ml   Filed Weights   03/05/24 1117  Weight: 59 kg    Examination:   Constitutional: NAD, AAOx3 HEENT: conjunctivae and lids normal, EOMI CV: No cyanosis.   RESP: normal respiratory effort, on RA Neuro: II - XII grossly intact.   Psych: Normal mood and affect.  Appropriate judgement and reason   Data Reviewed: I have personally reviewed labs and imaging studies  Time spent: 50 minutes  Charlotte Haber, MD [facility] If 7PM-7AM, please contact night-coverage 03/06/2024, 8:07 PM

## 2024-03-07 DIAGNOSIS — K5732 Diverticulitis of large intestine without perforation or abscess without bleeding: Secondary | ICD-10-CM | POA: Diagnosis not present

## 2024-03-07 LAB — MAGNESIUM: Magnesium: 2.1 mg/dL (ref 1.7–2.4)

## 2024-03-07 LAB — CBC
HCT: 27.9 % — ABNORMAL LOW (ref 36.0–46.0)
Hemoglobin: 8.3 g/dL — ABNORMAL LOW (ref 12.0–15.0)
MCH: 22.6 pg — ABNORMAL LOW (ref 26.0–34.0)
MCHC: 29.7 g/dL — ABNORMAL LOW (ref 30.0–36.0)
MCV: 75.8 fL — ABNORMAL LOW (ref 80.0–100.0)
Platelets: 257 K/uL (ref 150–400)
RBC: 3.68 MIL/uL — ABNORMAL LOW (ref 3.87–5.11)
RDW: 17.4 % — ABNORMAL HIGH (ref 11.5–15.5)
WBC: 20.8 K/uL — ABNORMAL HIGH (ref 4.0–10.5)
nRBC: 0 % (ref 0.0–0.2)

## 2024-03-07 LAB — BASIC METABOLIC PANEL WITH GFR
Anion gap: 8 (ref 5–15)
BUN: 26 mg/dL — ABNORMAL HIGH (ref 8–23)
CO2: 25 mmol/L (ref 22–32)
Calcium: 8.8 mg/dL — ABNORMAL LOW (ref 8.9–10.3)
Chloride: 98 mmol/L (ref 98–111)
Creatinine, Ser: 1.68 mg/dL — ABNORMAL HIGH (ref 0.44–1.00)
GFR, Estimated: 28 mL/min — ABNORMAL LOW (ref >60)
Glucose, Bld: 102 mg/dL — ABNORMAL HIGH (ref 70–99)
Potassium: 4.3 mmol/L (ref 3.5–5.1)
Sodium: 131 mmol/L — ABNORMAL LOW (ref 135–145)

## 2024-03-07 MED ORDER — HYDRALAZINE HCL 50 MG PO TABS
50.0000 mg | ORAL_TABLET | Freq: Three times a day (TID) | ORAL | Status: DC
  Administered 2024-03-07: 50 mg via ORAL
  Filled 2024-03-07: qty 1

## 2024-03-07 MED ORDER — SODIUM CHLORIDE 0.9 % IV SOLN: INTRAVENOUS | Status: AC

## 2024-03-07 NOTE — TOC Initial Note (Signed)
 Transition of Care [facility]) - Initial/Assessment Note    Patient Details  Name: Charlotte Glover MRN: [id number] Date of Birth: 05-03-1928  Transition of [facility]) CM/SW Contact:    Victory Jackquline RAMAN, RN Phone Number: 03/07/2024, 1:13 PM  Clinical Narrative:         Readmission prevention screen complete. RNCM met with patient, granddaughter at bedside. RNCM introduced role and explained that discharge planning would be discussed. PCP is Adeline Sage, MD. Patient's son, or daughter-in -law drives her to her appointments. [facility] in [place]. No issues obtaining medication. Patient lives home with her husband. No home health prior to admission. She has a walker, cane and shower chair at home. Her son will be picking her up at the tim of discharge. RNCM will continue to follow discharge planning/care coordination and update as applicable.    Expected Discharge Plan: Home/Self Care Barriers to Discharge: No Barriers Identified   Patient Goals and CMS Choice     Choice offered to / list presented to : NA      Expected Discharge Plan and Services                                              Prior Living Arrangements/Services   Lives with:: Spouse Patient language and need for interpreter reviewed:: Yes Do you feel safe going back to the place where you live?: Yes      Need for Family Participation in Patient Care: Yes (Comment) Care giver support system in place?: Yes (comment) Current home services: DME Criminal Activity/Legal Involvement Pertinent to Current Situation/Hospitalization: No - Comment as needed  Activities of Daily Living   ADL Screening (condition at time of admission) Independently performs ADLs?: Yes (appropriate for developmental age) Is the patient deaf or have difficulty hearing?: Yes Does the patient have difficulty seeing, even when wearing glasses/contacts?: No Does the patient have difficulty concentrating, remembering, or  making decisions?: No  Permission Sought/Granted                  Emotional Assessment Appearance:: Well-Groomed, Appears stated age, Developmentally appropriate Attitude/Demeanor/Rapport: Gracious, Self-Confident, Engaged Affect (typically observed): Accepting, Appropriate, Calm, Happy, Pleasant, Quiet, Stable Orientation: : Oriented to Self, Oriented to Place, Oriented to  Time, Oriented to Situation Alcohol  / Substance Use: Not Applicable Psych Involvement: No (comment)  Admission diagnosis:  Diverticulitis large intestine [K57.32] Patient Active Problem List   Diagnosis Date Noted   Malnutrition of moderate degree 03/06/2024   Diverticulitis large intestine 03/05/2024   Degeneration of intervertebral disc of lumbar region with discogenic back pain 12/31/2023   Chronic pain syndrome 12/31/2023   Lower GI bleed 09/16/2023   Lower GI bleeding 09/16/2023   GI bleeding 09/15/2023   Paroxysmal atrial fibrillation (HCC) 09/15/2023   Peripheral neuropathy 09/15/2023   Dyslipidemia 09/15/2023   Hypokalemia 09/15/2023   Hyponatremia 09/15/2023   Stage 3b chronic kidney disease (CKD) (HCC) 09/15/2023   ABLA (acute blood loss anemia) 09/15/2023   Rectal bleeding 09/11/2023   Hematochezia 09/10/2023   Bruising 05/28/2023   Right arm pain 05/28/2023   Noninfected skin tear of right leg 05/28/2023   GCA (giant cell arteritis) (HCC) 02/15/2023   Total knee replacement status 12/25/2021   Prediabetes 12/14/2021   Acquired thrombophilia (HCC) 08/07/2021   Primary osteoarthritis of right knee 04/09/2021   Swelling of limb 11/01/2020  Enlarged pulmonary artery (HCC) 05/23/2020   Moderate aortic insufficiency 03/07/2020   Intracranial carotid stenosis, right 05/29/2019   Chronic headaches 05/29/2019   Long term current use of systemic steroids 05/04/2019   Screening for osteoporosis 05/04/2019   Temporal arteritis (HCC) 04/07/2019   Meningioma (HCC) 12/29/2018   Chronic daily  headache 11/20/2018   Elevated erythrocyte sedimentation rate 11/05/2018   CKD (chronic kidney disease) stage 3, GFR 30-59 ml/min (HCC) 12/27/2017   Shingles 05/06/2017   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 04/12/2017   Primary osteoarthritis of right shoulder 12/07/2016   Rotator cuff tear, right 12/07/2016   Rotator cuff tendinitis, right 12/07/2016   CHB (complete heart block) (HCC) 06/30/2016   Severe mitral regurgitation 05/29/2016   Pedal edema 04/23/2016   Lung nodule, multiple 12/28/2015   Amnesia, global, transient 04/05/2015   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 06/30/2014   Biventricular cardiac pacemaker in situ 05/18/2014   Sleep disorder 05/06/2014   Diffuse pain 04/06/2014   Fatigue 03/16/2014   Headache 03/16/2014   History of melanoma 11/24/2013   Melanoma (HCC) 11/24/2013   DCIS (ductal carcinoma in situ) 08/11/2013   History of ductal carcinoma in situ (DCIS) of breast 08/11/2013   H/O non-ST elevation myocardial infarction (NSTEMI) 01/01/2013   Hearing impairment 01/01/2013   S/P ablation of atrial fibrillation 07/11/2012   Edema 01/23/2012   Hypertension 01/23/2012   Atrial fibrillation (HCC) 11/27/2011   Coronary artery disease 11/27/2011   PCP:  Fernande Ophelia JINNY DOUGLAS, MD Pharmacy:   [facility] [address] [place] - [address] [place] [address] Phone: [phone] Fax: [phone]  [facility] [address] [place] [address] Phone: [phone] Fax: [phone]     Social Drivers of Health (SDOH) Social History: SDOH Screenings   Food Insecurity: No Food Insecurity (03/05/2024)  Housing: Low Risk  (03/05/2024)  Transportation Needs: No Transportation Needs (03/05/2024)  Utilities: Not At Risk (03/05/2024)  Depression (PHQ2-9): Low Risk  (02/11/2024)  Financial Resource Strain: Low Risk  (02/18/2024)   Received from [facility]  Social Connections: Moderately Integrated (03/05/2024)  Tobacco Use: Low Risk  (03/05/2024)   SDOH Interventions:     Readmission Risk Interventions    03/07/2024    1:05 PM 09/16/2023    3:09 PM  Readmission Risk Prevention Plan  Transportation Screening Complete Complete  Medication Review Oceanographer) Complete Complete  PCP or Specialist appointment within 3-5 days of discharge Complete   HRI or Home Care Consult Complete Complete  SW Recovery Care/Counseling Consult Complete Complete  Palliative Care Screening Not Applicable Not Applicable  Skilled Nursing Facility Not Applicable Not Applicable

## 2024-03-07 NOTE — Plan of Care (Signed)

## 2024-03-07 NOTE — Progress Notes (Signed)
  PROGRESS NOTE    Charlotte Glover  FMW:[id number] DOB: 1927/11/02 DOA: 03/05/2024 PCP: Fernande Ophelia JINNY DOUGLAS, MD  [id number]  LOS: 2 days   Brief hospital course:   Assessment & Plan: Charlotte Glover is a 89 y.o. female with medical history significant for hypertension, complete heart block (pacemaker), paroxysmal atrial fibrillation, CKD 3b and Giant Cell arteritis. The patient states that she awoke at 0300 with abdominal cramping that was located in her left flank.    Acute uncomplicated diverticulitis  --WBC remained elevated around 20's --cont ceftriaxone  and Flagyl   AKI on CKD 3a --Cr increased from 1.08 to 1.68 this morning.  Pt reported adequate hydration, so AKI may be due to infection. --start MIVF --hold home lasix  and aldactone   Atrial fibrillation (HCC) S/P ablation of atrial fibrillation Rate is controlled without rate limiting medication.  --cont Eliquis    Biventricular cardiac pacemaker in situ For complete heart block.   CKD (chronic kidney disease) stage 3a, GFR 30-59 ml/min (HCC) Creatinine is at baseline at 1.08.    Dyslipidemia --cont statin   Long term current use of systemic steroids GCA (giant cell arteritis) (HCC) Patient's steroids have been able to be tapered down to 5 mg of prednisone  daily. --cont home prendisone   Chronic pain on chronic opioids --cont home tramadol   Chronic constipation --cont home bowel regimen  HTN --cont home Imdur  --hold home lasix  and aldactone  due to AKI --start oral hydralazine    DVT prophylaxis: On:Eliquis  Code Status: Full code  Family Communication: son updated on the phone today Level of care: Med-Surg Dispo:   The patient is from: home Anticipated d/c is to: home Anticipated d/c date is: 1-2 days   Subjective and Interval History:  Pt reported feeling well, ready to go home, however, WBC did not improve and Cr worsened.   Objective: Vitals:   03/07/24 0323 03/07/24 0830 03/07/24  1024 03/07/24 1632  BP: (!) 145/56 (!) 198/90 (!) 140/79 (!) 170/65  Pulse: 71 70 70 71  Resp: 14 17 17 17   Temp: 98.2 F (36.8 C) 97.7 F (36.5 C) 97.8 F (36.6 C) 97.7 F (36.5 C)  TempSrc:   Oral   SpO2: 98% 100% 100% 100%  Weight:      Height:        Intake/Output Summary (Last 24 hours) at 03/07/2024 1748 Last data filed at 03/07/2024 1300 Gross per 24 hour  Intake 988.21 ml  Output --  Net 988.21 ml   Filed Weights   03/05/24 1117  Weight: 59 kg    Examination:   Constitutional: NAD, AAOx3 HEENT: conjunctivae and lids normal, EOMI CV: No cyanosis.   RESP: normal respiratory effort, on RA Neuro: II - XII grossly intact.   Psych: Normal mood and affect.  Appropriate judgement and reason   Data Reviewed: I have personally reviewed labs and imaging studies  Time spent: 50 minutes  Ellouise Haber, MD [facility] If 7PM-7AM, please contact night-coverage 03/07/2024, 5:48 PM

## 2024-03-08 ENCOUNTER — Inpatient Hospital Stay

## 2024-03-08 DIAGNOSIS — K5732 Diverticulitis of large intestine without perforation or abscess without bleeding: Secondary | ICD-10-CM | POA: Diagnosis not present

## 2024-03-08 LAB — BASIC METABOLIC PANEL WITH GFR
Anion gap: 9 (ref 5–15)
BUN: 36 mg/dL — ABNORMAL HIGH (ref 8–23)
CO2: 21 mmol/L — ABNORMAL LOW (ref 22–32)
Calcium: 8.5 mg/dL — ABNORMAL LOW (ref 8.9–10.3)
Chloride: 101 mmol/L (ref 98–111)
Creatinine, Ser: 2.18 mg/dL — ABNORMAL HIGH (ref 0.44–1.00)
GFR, Estimated: 21 mL/min — ABNORMAL LOW (ref >60)
Glucose, Bld: 102 mg/dL — ABNORMAL HIGH (ref 70–99)
Potassium: 3.7 mmol/L (ref 3.5–5.1)
Sodium: 131 mmol/L — ABNORMAL LOW (ref 135–145)

## 2024-03-08 LAB — CBC
HCT: 28.1 % — ABNORMAL LOW (ref 36.0–46.0)
Hemoglobin: 8.3 g/dL — ABNORMAL LOW (ref 12.0–15.0)
MCH: 22.6 pg — ABNORMAL LOW (ref 26.0–34.0)
MCHC: 29.5 g/dL — ABNORMAL LOW (ref 30.0–36.0)
MCV: 76.6 fL — ABNORMAL LOW (ref 80.0–100.0)
Platelets: 258 K/uL (ref 150–400)
RBC: 3.67 MIL/uL — ABNORMAL LOW (ref 3.87–5.11)
RDW: 17.5 % — ABNORMAL HIGH (ref 11.5–15.5)
WBC: 14.2 K/uL — ABNORMAL HIGH (ref 4.0–10.5)
nRBC: 0 % (ref 0.0–0.2)

## 2024-03-08 MED ORDER — METRONIDAZOLE 500 MG/100ML IV SOLN
500.0000 mg | Freq: Three times a day (TID) | INTRAVENOUS | Status: DC
Start: 2024-03-08 — End: 2024-03-09
  Administered 2024-03-08 – 2024-03-09 (×3): 500 mg via INTRAVENOUS
  Filled 2024-03-08 (×4): qty 100

## 2024-03-08 MED ORDER — CHLORHEXIDINE GLUCONATE CLOTH 2 % EX PADS
6.0000 | MEDICATED_PAD | Freq: Every day | CUTANEOUS | Status: DC
  Administered 2024-03-08 – 2024-03-09 (×2): 6 via TOPICAL

## 2024-03-08 NOTE — Progress Notes (Incomplete Revision)
  PROGRESS NOTE    Charlotte Glover  FMW:[id number] DOB: January 18, 1928 DOA: 03/05/2024 PCP: Charlotte Ophelia JINNY DOUGLAS, MD  [id number]  LOS: 3 days   Brief hospital course:   Assessment & Plan: Charlotte Glover is a 89 y.o. female with medical history significant for hypertension, complete heart block (pacemaker), paroxysmal atrial fibrillation, CKD 3b and Giant Cell arteritis. The patient states that she awoke at 0300 with abdominal cramping that was located in her left flank.    Acute uncomplicated diverticulitis  --WBC started to trend down --cont ceftriaxone  and flagyl   AKI on CKD 3a --Cr increased from 1.08 to 1.68, and then 2.18 this morning.  Pt reported adequate hydration, so AKI may be due to infection or contrast-induced. --cont MIVF --hold home lasix  and aldactone   Atrial fibrillation (HCC) S/P ablation of atrial fibrillation Rate is controlled without rate limiting medication.  --cont Eliquis    Biventricular cardiac pacemaker in situ For complete heart block.   Dyslipidemia --cont statin   Long term current use of systemic steroids GCA (giant cell arteritis) (HCC) Patient's steroids have been able to be tapered down to 5 mg of prednisone  daily. --cont home prendisone   Chronic pain on chronic opioids --cont home tramadol   Chronic constipation --cont home bowel regimen  HTN --cont home Imdur  --hold home lasix  and aldactone  due to AKI --cont hydralazine  (new)  Acute urinary retention --bladder scan ~400 ml, Foley inserted today with >400 ml return. --cont Foley  Moderate malnutrition  --supplement per dietician   DVT prophylaxis: On:Eliquis  Code Status: Full code  Family Communication: son and family updated at the bedside today Level of care: Med-Surg Dispo:   The patient is from: home Anticipated d/c is to: home Anticipated d/c date is: 1-2 days   Subjective and Interval History:  Pt had urinary retention today, due to difficult with  I/O, foley inserted.  Cr continued to worsen.   Objective: Vitals:   03/07/24 2055 03/08/24 0400 03/08/24 0721 03/08/24 1614  BP: (!) 140/78 (!) 150/68 107/72 (!) 142/82  Pulse: 70 70 72 70  Resp: 18 18 17 17   Temp: (!) 97.5 F (36.4 C) 97.7 F (36.5 C) 97.9 F (36.6 C) 97.8 F (36.6 C)  TempSrc: Oral Oral    SpO2: 100% 100% 99% 100%  Weight:      Height:        Intake/Output Summary (Last 24 hours) at 03/08/2024 1739 Last data filed at 03/08/2024 1732 Gross per 24 hour  Intake 2023 ml  Output 550 ml  Net 1473 ml   Filed Weights   03/05/24 1117  Weight: 59 kg    Examination:   Constitutional: NAD, AAOx3 HEENT: conjunctivae and lids normal, EOMI CV: No cyanosis.   RESP: normal respiratory effort, on RA Neuro: II - XII grossly intact.   Psych: Normal mood and affect.  Appropriate judgement and reason Foley present   Data Reviewed: I have personally reviewed labs and imaging studies  Time spent: 50 minutes  Charlotte Haber, MD [facility] If 7PM-7AM, please contact night-coverage 03/08/2024, 5:39 PM

## 2024-03-08 NOTE — Plan of Care (Signed)
  Problem: Education: Goal: Knowledge of General Education information will improve Description: Including pain rating scale, medication(s)/side effects and non-pharmacologic comfort measures Outcome: Progressing   Problem: Clinical Measurements: Goal: Will remain free from infection Outcome: Progressing   Problem: Activity: Goal: Risk for activity intolerance will decrease Outcome: Progressing   Problem: Coping: Goal: Level of anxiety will decrease Outcome: Progressing   Problem: Elimination: Goal: Will not experience complications related to bowel motility Outcome: Progressing Goal: Will not experience complications related to urinary retention Outcome: Progressing   Problem: Pain Managment: Goal: General experience of comfort will improve and/or be controlled Outcome: Progressing   Problem: Safety: Goal: Ability to remain free from injury will improve Outcome: Progressing   Problem: Skin Integrity: Goal: Risk for impaired skin integrity will decrease Outcome: Progressing

## 2024-03-08 NOTE — Progress Notes (Addendum)
 0830 pt c/o fullness and discomfort in bladder and inability to void. Bladder scan revealed fluid retained. MD notified, ordered I/O cath. RNs attempted 4 times to place a straight cath; were unsuccessful. MD notified, order updated to indwelling catheter for urinary retention. ICU charge nurse called to place catheter, caude catheter 38fr indwelling placed. initial output. Pt stated feeling relief. Rosaline DELENA Range, RN

## 2024-03-08 NOTE — Plan of Care (Signed)

## 2024-03-08 NOTE — Progress Notes (Addendum)
  PROGRESS NOTE    Charlotte Glover  FMW:[id number] DOB: January 18, 1928 DOA: 03/05/2024 PCP: Fernande Ophelia JINNY DOUGLAS, MD  154A/[id number]-AA  LOS: 3 days   Brief hospital course:   Assessment & Plan: Charlotte Glover is a 89 y.o. female with medical history significant for hypertension, complete heart block (pacemaker), paroxysmal atrial fibrillation, CKD 3b and Giant Cell arteritis. The patient states that she awoke at 0300 with abdominal cramping that was located in her left flank.    Acute uncomplicated diverticulitis  --WBC started to trend down --cont ceftriaxone  and flagyl   AKI on CKD 3a --Cr increased from 1.08 to 1.68, and then 2.18 this morning.  Pt reported adequate hydration, so AKI may be due to infection or contrast-induced. --cont MIVF --hold home lasix  and aldactone   Atrial fibrillation (HCC) S/P ablation of atrial fibrillation Rate is controlled without rate limiting medication.  --cont Eliquis    Biventricular cardiac pacemaker in situ For complete heart block.   Dyslipidemia --cont statin   Long term current use of systemic steroids GCA (giant cell arteritis) (HCC) Patient's steroids have been able to be tapered down to 5 mg of prednisone  daily. --cont home prendisone   Chronic pain on chronic opioids --cont home tramadol   Chronic constipation --cont home bowel regimen  HTN --cont home Imdur  --hold home lasix  and aldactone  due to AKI --cont hydralazine  (new)  Acute urinary retention --bladder scan ~400 ml, Foley inserted today with >400 ml return. --cont Foley  Moderate malnutrition  --supplement per dietician   DVT prophylaxis: On:Eliquis  Code Status: Full code  Family Communication: son and family updated at the bedside today Level of care: Med-Surg Dispo:   The patient is from: home Anticipated d/c is to: home Anticipated d/c date is: 1-2 days   Subjective and Interval History:  Pt had urinary retention today, due to difficult with  I/O, foley inserted.  Cr continued to worsen.   Objective: Vitals:   03/07/24 2055 03/08/24 0400 03/08/24 0721 03/08/24 1614  BP: (!) 140/78 (!) 150/68 107/72 (!) 142/82  Pulse: 70 70 72 70  Resp: 18 18 17 17   Temp: (!) 97.5 F (36.4 C) 97.7 F (36.5 C) 97.9 F (36.6 C) 97.8 F (36.6 C)  TempSrc: Oral Oral    SpO2: 100% 100% 99% 100%  Weight:      Height:        Intake/Output Summary (Last 24 hours) at 03/08/2024 1739 Last data filed at 03/08/2024 1732 Gross per 24 hour  Intake 2023 ml  Output 550 ml  Net 1473 ml   Filed Weights   03/05/24 1117  Weight: 59 kg    Examination:   Constitutional: NAD, AAOx3 HEENT: conjunctivae and lids normal, EOMI CV: No cyanosis.   RESP: normal respiratory effort, on RA Neuro: II - XII grossly intact.   Psych: Normal mood and affect.  Appropriate judgement and reason Foley present   Data Reviewed: I have personally reviewed labs and imaging studies  Time spent: 50 minutes  Ellouise Haber, MD [facility] If 7PM-7AM, please contact night-coverage 03/08/2024, 5:39 PM

## 2024-03-09 ENCOUNTER — Other Ambulatory Visit: Payer: Self-pay

## 2024-03-09 LAB — CBC
HCT: 27.2 % — ABNORMAL LOW (ref 36.0–46.0)
Hemoglobin: 8.2 g/dL — ABNORMAL LOW (ref 12.0–15.0)
MCH: 22.8 pg — ABNORMAL LOW (ref 26.0–34.0)
MCHC: 30.1 g/dL (ref 30.0–36.0)
MCV: 75.8 fL — ABNORMAL LOW (ref 80.0–100.0)
Platelets: 272 K/uL (ref 150–400)
RBC: 3.59 MIL/uL — ABNORMAL LOW (ref 3.87–5.11)
RDW: 17.6 % — ABNORMAL HIGH (ref 11.5–15.5)
WBC: 9.7 K/uL (ref 4.0–10.5)
nRBC: 0 % (ref 0.0–0.2)

## 2024-03-09 LAB — BASIC METABOLIC PANEL WITH GFR
Anion gap: 7 (ref 5–15)
BUN: 33 mg/dL — ABNORMAL HIGH (ref 8–23)
CO2: 21 mmol/L — ABNORMAL LOW (ref 22–32)
Calcium: 8.5 mg/dL — ABNORMAL LOW (ref 8.9–10.3)
Chloride: 110 mmol/L (ref 98–111)
Creatinine, Ser: 1.79 mg/dL — ABNORMAL HIGH (ref 0.44–1.00)
GFR, Estimated: 26 mL/min — ABNORMAL LOW (ref >60)
Glucose, Bld: 98 mg/dL (ref 70–99)
Potassium: 3.8 mmol/L (ref 3.5–5.1)
Sodium: 138 mmol/L (ref 135–145)

## 2024-03-09 MED ORDER — SODIUM CHLORIDE 0.9 % IV SOLN
2.0000 g | Freq: Once | INTRAVENOUS | Status: AC
  Administered 2024-03-09: 2 g via INTRAVENOUS
  Filled 2024-03-09: qty 20

## 2024-03-09 MED ORDER — ENSURE PLUS HIGH PROTEIN PO LIQD: 237.0000 mL | Freq: Two times a day (BID) | ORAL | Status: AC

## 2024-03-09 MED ORDER — SODIUM CHLORIDE 0.9 % IV SOLN
INTRAVENOUS | Status: DC
Start: 2024-03-09 — End: 2024-03-09

## 2024-03-09 MED ORDER — METRONIDAZOLE 500 MG PO TABS
500.0000 mg | ORAL_TABLET | Freq: Two times a day (BID) | ORAL | 0 refills | Status: AC
  Filled 2024-03-09: qty 5, 3d supply, fill #0

## 2024-03-09 MED ORDER — CIPROFLOXACIN HCL 250 MG PO TABS
250.0000 mg | ORAL_TABLET | Freq: Two times a day (BID) | ORAL | 0 refills | Status: AC
Start: 2024-03-10 — End: 2024-03-12
  Filled 2024-03-09: qty 4, 2d supply, fill #0

## 2024-03-09 NOTE — Plan of Care (Signed)
  Problem: Education: Goal: Knowledge of General Education information will improve Description: Including pain rating scale, medication(s)/side effects and non-pharmacologic comfort measures Outcome: Adequate for Discharge   Problem: Health Behavior/Discharge Planning: Goal: Ability to manage health-related needs will improve Outcome: Adequate for Discharge   Problem: Clinical Measurements: Goal: Ability to maintain clinical measurements within normal limits will improve Outcome: Adequate for Discharge   Problem: Activity: Goal: Risk for activity intolerance will decrease Outcome: Adequate for Discharge   Problem: Nutrition: Goal: Adequate nutrition will be maintained Outcome: Adequate for Discharge   Problem: Coping: Goal: Level of anxiety will decrease Outcome: Adequate for Discharge   

## 2024-03-09 NOTE — Discharge Summary (Signed)
 Physician Discharge Summary   Charlotte Glover  female DOB: Oct 30, 1927  FMW:[id number]  PCP: Fernande Ophelia JINNY DOUGLAS, MD  Admit date: 03/05/2024 Discharge date: 03/09/2024  Admitted From: home Disposition:  home Son updated at bedside prior to discharge. CODE STATUS: Full code  Discharge Instructions     Discharge instructions   Complete by: As directed    For your diverticulitis, you have received 5 days of IV antibiotics.  Please take oral Flagyl  tonight, and take Cipro  and flagyl  twice a day on 9/16 and 9/17, and then you are done.  Because of your acute kidney injury, please hold Lasix , Metolazone , aldactone  and KLOR-CON  until follow up with your primary care doctor. - -   No wound care   Complete by: As directed       Hospital Course:  For full details, please see H&P, progress notes, consult notes and ancillary notes.  Briefly,  Charlotte Glover is a 89 y.o. female with medical history significant for hypertension, complete heart block (pacemaker), paroxysmal atrial fibrillation, CKD 3b and Giant Cell arteritis.   The patient stated that she awoke at 0300 with abdominal cramping that was located in her left flank.    Acute uncomplicated diverticulitis  --Pt received 5 days of ceftriaxone  and flagyl  with resolution of symptoms and leukocytosis.  Pt was discharged on Cipro  and Flagyl  for 2 more days.   AKI on CKD 3a --Cr increased from 1.08 to 1.68, and then 2.18.  Pt reported adequate hydration, so AKI may be due to infection or CT contrast-induced. --received MIVF, with Cr improved to 1.79.  Pt did not want to stay inpatient longer, so was discharged with recommendation for plenty of oral hydration and outpatient f/u to check Cr. --hold home lasix , metolazone  and aldactone  pending outpatient f/u.   Atrial fibrillation (HCC) S/P ablation of atrial fibrillation Rate is controlled without rate limiting medication.  --cont Eliquis    Biventricular cardiac  pacemaker in situ For complete heart block.   Dyslipidemia --cont statin   Long term current use of systemic steroids GCA (giant cell arteritis) (HCC) Patient's steroids have been able to be tapered down to 5 mg of prednisone  daily. --cont home prendisone   Chronic pain on chronic opioids --cont home tramadol    Chronic constipation --cont home bowel regimen   HTN --cont home Imdur  --hold home lasix , metolazone  and aldactone  pending outpatient f/u due to AKI.   Acute urinary retention --bladder scan ~400 ml, Foley inserted with >400 ml return. --voiding trial performed prior to discharge, and pt was able to void, so discharged withOUT Foley.   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 The score is based on dignosis, age, lab data,  medications, orders, and past utilization.   Low:  0-14.9   Medium: 15-21.9   High: 22-29.9   Extreme: 30 and above         Discharge Instructions:  Allergies as of 03/09/2024       Reactions   Amiodarone Other (See Comments)   Bradycardia and sinus pauses   Baclofen Other (See Comments)   Double vision Vision disturbances Double vision     Vision disturbances   Metoprolol  Rash   Pantoprazole  Rash   Silicone Rash   Tape causes a skin rash.  ECG electrodes cause skin irritation when left on for prolonged period of time.   Tape Rash        Medication List     PAUSE taking these medications    furosemide  40 MG tablet Wait to take this until your doctor or other care provider tells you to start again. Until follow up with your PCP, due to acute kidney injury. Commonly known as: LASIX  Take 40 mg by mouth 2 (two) times daily.   Klor-Con  M10 10 MEQ tablet Wait to take this until your doctor or other care provider tells you to start again. Hold if not taking fluid pills. Generic drug: potassium chloride  Take 2 tablets by mouth 2 (two) times daily.   metolazone  5 MG tablet Wait to take this until your doctor or other care provider tells you to start again. Until follow up with your PCP, due to acute kidney injury. Commonly known as: ZAROXOLYN  Take 5 mg by mouth daily. Takes on Friday   spironolactone  25 MG tablet Wait to take this until your doctor or other care provider tells you to start again. Until follow up with your PCP, due to acute kidney injury. Commonly known as: ALDACTONE  Take 25 mg by mouth 2 (two) times daily.       TAKE these medications    acetaminophen  500 MG tablet Commonly known as: TYLENOL  Take 1,000 mg by mouth every 6 (six) hours as needed for pain or fever.   AeroChamber MV inhaler Use as instructed   albuterol  108 (90 Base) MCG/ACT inhaler Commonly known as: VENTOLIN  HFA Inhale 2 puffs into the lungs every 4 (four) hours as needed.   alendronate 70 MG tablet Commonly known as: FOSAMAX Take 70 mg by mouth once a week.   ciprofloxacin  250 MG tablet Commonly known as: CIPRO  Take 1 tablet (250 mg total) by mouth 2 (two) times daily for 2 days. Start taking on: March 10, 2024   Eliquis  2.5 MG Tabs tablet Generic drug: apixaban  SMARTSIG:1 Tablet(s) By Mouth Every 12 Hours    esomeprazole 40 MG capsule Commonly known as: NEXIUM Take 40 mg by mouth daily.   feeding supplement Liqd Take 237 mLs by mouth 2 (two) times daily between meals.   gabapentin  300 MG capsule Commonly known as: NEURONTIN  Take 300 mg by mouth 3 (three) times daily.   ipratropium 0.03 % nasal spray Commonly known as: ATROVENT  Place into the nose.   isosorbide  mononitrate 30 MG 24 hr tablet Commonly known as: IMDUR  Take 30 mg by mouth daily.   metroNIDAZOLE  500 MG tablet Commonly known as: FLAGYL  Take 1 tablet (500 mg total) by mouth 2 (two) times daily for 5 doses.   nitroGLYCERIN  0.4 MG SL tablet Commonly known as: NITROSTAT  Place 0.4 mg under the tongue every 5 (five) minutes x 2 doses as needed for chest pain.   predniSONE  5 MG tablet Commonly known as: DELTASONE  Take 5 mg  by mouth daily with breakfast.   PreserVision AREDS 2 Caps Take 1 capsule by mouth 2 (two) times daily.   REFRESH OP Apply 1 drop to eye daily as needed (dry eyes).   rosuvastatin  10 MG tablet Commonly known as: CRESTOR  Take 10 mg by mouth daily.   senna 8.6 MG Tabs tablet Commonly known as: SENOKOT Take 1 tablet by mouth daily as needed for mild constipation.   traMADol  50 MG tablet Commonly known as: ULTRAM  Take 1 tablet (50 mg total) by mouth every 12 (twelve) hours as needed for severe pain (pain score 7-10).   triamcinolone ointment 0.1 % Commonly known as: KENALOG Apply 1 application  topically as needed.         Follow-up Information     Fernande Ophelia PARAS III, MD Follow up in 1 week(s).   Specialty: Internal Medicine Contact information: [address] [facility] [place] [address] [phone]                 Allergies  Allergen Reactions   Amiodarone Other (See Comments)    Bradycardia and sinus pauses   Baclofen Other (See Comments)    Double vision  Vision disturbances  Double vision    Vision disturbances   Metoprolol  Rash    Pantoprazole  Rash   Silicone Rash    Tape causes a skin rash.  ECG electrodes cause skin irritation when left on for prolonged period of time.   Tape Rash     The results of significant diagnostics from this hospitalization (including imaging, microbiology, ancillary and laboratory) are listed below for reference.   Consultations:   Procedures/Studies: US  RENAL Result Date: 03/08/2024 EXAM: US  Retroperitoneum Complete, Renal. CLINICAL HISTORY: 409830 AKI (acute kidney injury) (HCC) 409830. AKI (acute kidney injury) (HCC) [id number]. TECHNIQUE: Real-time ultrasound of the retroperitoneum (complete) with image documentation. COMPARISON: CT 03/05/24 FINDINGS: RIGHT KIDNEY: Measures 9.9 x 4.7 x 4.3 cm (volume 111 cc). There is a 2.1 cm parapelvic simple cyst. LEFT KIDNEY: Measures 9.9 x 5.4 x 4.8 cm (volume  136 cc). BLADDER: Unremarkable as visualized. IMPRESSION: 1. No hydronephrosis. 2. 2.1 cm parapelvic simple cyst in the right kidney. Electronically signed by: Katheleen Faes MD 03/08/2024 02:38 PM EDT RP Workstation: [id number]   CT ABDOMEN PELVIS W CONTRAST Result Date: 03/05/2024 CLINICAL DATA:  LLQ abdominal pain. EXAM: CT ABDOMEN AND PELVIS WITH CONTRAST TECHNIQUE: Multidetector CT imaging of the abdomen and pelvis was performed using the standard protocol following bolus administration of intravenous contrast. RADIATION DOSE REDUCTION: This exam was performed according to the departmental dose-optimization program which includes automated exposure control, adjustment of the mA and/or kV according to patient size and/or use of iterative reconstruction technique. CONTRAST:  OMNIPAQUE  IOHEXOL  300 MG/ML  SOLN COMPARISON:  Nuclear medicine PET scan from 01/14/2018. FINDINGS: Lower chest: There are dependent atelectatic changes in the visualized lung bases. No overt consolidation. No pleural effusion. The heart is normal in size. No pericardial effusion. There are coronary artery atherosclerotic  calcifications, in keeping with coronary artery disease. Partially seen pacemaker wires. Hepatobiliary: The liver is normal in size. Non-cirrhotic configuration. No suspicious mass. These is mild diffuse hepatic steatosis. No intrahepatic or extrahepatic bile duct dilation. Gallbladder is surgically absent. Pancreas: Unremarkable. No pancreatic ductal dilatation or surrounding inflammatory changes. Spleen: Normal in size. There are subcentimeter calcified granulomas in the spleen. No other focal lesion. Adrenals/Urinary Tract: Adrenal glands are unremarkable. No suspicious renal lesion. There is a 1.9 x 2.8 cm sinus  cyst in the right kidney lower pole. Nephroureterolithiasis or obstructive uropathy on either side. Unremarkable urinary bladder. Stomach/Bowel: There is a small sliding hiatal hernia. There is short segment of colon (approximately 5 cm), at the level of junction of descending colon and proximal sigmoid colon, exhibiting mild irregular wall thickening and pericolonic fat stranding on the background of several diverticula, compatible with acute uncomplicated diverticulitis. No walled off abscess or loculated collection. No pneumoperitoneum. No disproportionate dilation of small or large bowel loops to suggest bowel obstruction. The appendix is unremarkable. Vascular/Lymphatic: No ascites or pneumoperitoneum. No abdominal or pelvic lymphadenopathy, by size criteria. No aneurysmal dilation of the major abdominal arteries. There are mild peripheral atherosclerotic vascular calcifications of the aorta and its major branches. Reproductive: The uterus is unremarkable. No large adnexal mass. Other: There is a small fat containing left inguinal hernia. There is also small right femoral hernia containing ascitic fluid. The soft tissues and abdominal wall are otherwise unremarkable. Musculoskeletal: No suspicious osseous lesions. There are mild - moderate multilevel degenerative changes in the visualized spine.  IMPRESSION: 1. Findings compatible with acute uncomplicated diverticulitis of the junction of descending colon and proximal sigmoid colon. No walled off abscess or loculated collection. No pneumoperitoneum. 2. Multiple other nonacute observations, as described above. Aortic Atherosclerosis (ICD10-I70.0). Electronically Signed   By: Ree Molt M.D.   On: 03/05/2024 15:02      Labs: BNP (last 3 results) No results for input(s): BNP in the last 8760 hours. Basic Metabolic Panel: Recent Labs  Lab 03/05/24 1119 03/07/24 0620 03/08/24 0350 03/09/24 0436  NA 134* 131* 131* 138  K 4.0 4.3 3.7 3.8  CL 95* 98 101 110  CO2 26 25 21* 21*  GLUCOSE 106* 102* 102* 98  BUN 19 26* 36* 33*  CREATININE 1.08* 1.68* 2.18* 1.79*  CALCIUM  9.5 8.8* 8.5* 8.5*  MG  --  2.1  --   --    Liver Function Tests: Recent Labs  Lab 03/05/24 1119  AST 22  ALT 14  ALKPHOS 35*  BILITOT 1.2  PROT 5.6*  ALBUMIN 3.6   Recent Labs  Lab 03/05/24 1119  LIPASE 39   No results for input(s): AMMONIA in the last 168 hours. CBC: Recent Labs  Lab 03/05/24 1119 03/07/24 0620 03/08/24 0350 03/09/24 0436  WBC 21.4* 20.8* 14.2* 9.7  HGB 9.9* 8.3* 8.3* 8.2*  HCT 33.7* 27.9* 28.1* 27.2*  MCV 77.1* 75.8* 76.6* 75.8*  PLT 323 257 258 272   Cardiac Enzymes: No results for input(s): CKTOTAL, CKMB, CKMBINDEX, TROPONINI in the last 168 hours. BNP: Invalid input(s): POCBNP CBG: No results for input(s): GLUCAP in the last 168 hours. D-Dimer No results for input(s): DDIMER in the last 72 hours. Hgb A1c No results for input(s): HGBA1C in the last 72 hours. Lipid Profile No results for input(s): CHOL, HDL, LDLCALC, TRIG, CHOLHDL, LDLDIRECT in the last 72 hours. Thyroid function studies No results for input(s): TSH, T4TOTAL, T3FREE, THYROIDAB in the last 72 hours.  Invalid input(s): FREET3 Anemia work up No results for input(s): VITAMINB12, FOLATE, FERRITIN,  TIBC, IRON, RETICCTPCT in the last 72 hours. Urinalysis    Component Value Date/Time   COLORURINE YELLOW (A) 12/13/2021 0932   APPEARANCEUR CLOUDY (A) 12/13/2021 0932   APPEARANCEUR Clear 04/17/2012 1241   LABSPEC 1.014 12/13/2021 0932   LABSPEC 1.004 04/17/2012 1241   PHURINE 5.0 12/13/2021 0932   GLUCOSEU NEGATIVE 12/13/2021 0932   GLUCOSEU Negative 04/17/2012 1241   HGBUR NEGATIVE 12/13/2021 0932  BILIRUBINUR NEGATIVE 12/13/2021 0932   BILIRUBINUR Negative 04/17/2012 1241   KETONESUR NEGATIVE 12/13/2021 0932   PROTEINUR NEGATIVE 12/13/2021 0932   NITRITE NEGATIVE 12/13/2021 0932   LEUKOCYTESUR SMALL (A) 12/13/2021 0932   LEUKOCYTESUR Negative 04/17/2012 1241   Sepsis Labs Recent Labs  Lab 03/05/24 1119 03/07/24 0620 03/08/24 0350 03/09/24 0436  WBC 21.4* 20.8* 14.2* 9.7   Microbiology Recent Results (from the past 240 hours)  Culture, blood (Routine X 2) w Reflex to ID Panel     Status: None (Preliminary result)   Collection Time: 03/05/24  6:37 PM   Specimen: BLOOD  Result Value Ref Range Status   Specimen Description BLOOD RIGHT ANTECUBITAL  Final   Special Requests BOTTLES DRAWN AEROBIC ONLY RIGHT ANTECUBITAL  Final   Culture   Final    NO GROWTH 4 DAYS Performed at [facility], [address] [place], [place] [address]    Report Status PENDING  Incomplete  Culture, blood (Routine X 2) w Reflex to ID Panel     Status: None (Preliminary result)   Collection Time: 03/05/24  6:42 PM   Specimen: BLOOD  Result Value Ref Range Status   Specimen Description BLOOD BLOOD LEFT HAND  Final   Special Requests   Final    BOTTLES DRAWN AEROBIC ONLY Blood Culture results may not be optimal due to an inadequate volume of blood received in culture bottles   Culture   Final    NO GROWTH 4 DAYS Performed at [facility], [address], [place], [place] [address]    Report Status PENDING  Incomplete     Total time spend on discharging this  patient, including the last patient exam, discussing the hospital stay, instructions for ongoing care as it relates to all pertinent caregivers, as well as preparing the medical discharge records, prescriptions, and/or referrals as applicable, is 35 minutes.    Ellouise Haber, MD  [facility] 03/09/2024, 2:59 PM

## 2024-03-09 NOTE — Plan of Care (Signed)
 Pt had one loose incontinent of stool episode and one small continent BM overnight.    Problem: Education: Goal: Knowledge of General Education information will improve Description: Including pain rating scale, medication(s)/side effects and non-pharmacologic comfort measures Outcome: Progressing   Problem: Health Behavior/Discharge Planning: Goal: Ability to manage health-related needs will improve Outcome: Progressing   Problem: Clinical Measurements: Goal: Ability to maintain clinical measurements within normal limits will improve Outcome: Progressing Goal: Will remain free from infection Outcome: Progressing Goal: Diagnostic test results will improve Outcome: Progressing Goal: Respiratory complications will improve Outcome: Progressing Goal: Cardiovascular complication will be avoided Outcome: Progressing   Problem: Activity: Goal: Risk for activity intolerance will decrease Outcome: Progressing   Problem: Nutrition: Goal: Adequate nutrition will be maintained Outcome: Progressing   Problem: Coping: Goal: Level of anxiety will decrease Outcome: Progressing   Problem: Elimination: Goal: Will not experience complications related to bowel motility Outcome: Progressing Goal: Will not experience complications related to urinary retention Outcome: Progressing   Problem: Pain Managment: Goal: General experience of comfort will improve and/or be controlled Outcome: Progressing   Problem: Safety: Goal: Ability to remain free from injury will improve Outcome: Progressing   Problem: Skin Integrity: Goal: Risk for impaired skin integrity will decrease Outcome: Progressing

## 2024-03-09 NOTE — Progress Notes (Signed)
 All requirements for discharge met. Rosaline DELENA Range, RN

## 2024-03-09 NOTE — Care Management Important Message (Signed)
 Important Message  Patient Details  Name: Charlotte Glover MRN: [id number] Date of Birth: 04-01-1928   Important Message Given:  Yes - Medicare IM     Shemaiah Round W, CMA 03/09/2024, 1:22 PM

## 2024-03-10 ENCOUNTER — Ambulatory Visit: Admitting: Student in an Organized Health Care Education/Training Program

## 2024-03-10 LAB — CULTURE, BLOOD (ROUTINE X 2)
Culture: NO GROWTH
Culture: NO GROWTH

## 2024-03-11 ENCOUNTER — Ambulatory Visit: Admitting: Physician Assistant

## 2024-03-11 ENCOUNTER — Ambulatory Visit: Admitting: Nurse Practitioner

## 2024-03-16 ENCOUNTER — Ambulatory Visit: Admitting: Nurse Practitioner

## 2024-03-18 NOTE — Progress Notes (Signed)
 History of Present Illness Charlotte Glover is a 89 year old female who presents for follow-up of fluid retention and urinary issues.  She experiences persistent fluid retention, particularly in her legs, despite some weight reduction. Her legs have an unusual shape and she has a blood blister that she is hesitant to tape tightly due to fear of fluid accumulation.  She has reduced urine output and feels that the diuretic furosemide  is not working as effectively as before. She has been taking furosemide  every three days and then every other day, but on days without it, she notices a significant decrease in urine output. She drinks a lot of fluids, including water, juice, and orange soda, and is mindful of sodium intake.  She reports difficulty breathing, which she hopes will improve with fluid reduction. Her bowel movements have been better, and she occasionally takes tramadol  for pain, which she feels is manageable. She is concerned about a previous fluid pocket that took a long time to heal and is worried about similar issues recurring. No chest pain.    Current Outpatient Medications  Medication Sig Dispense Refill  . acetaminophen  (TYLENOL ) 500 mg capsule Take 1,000 mg by mouth nightly as needed.      SABRA alendronate (FOSAMAX) 70 MG tablet Take 1 tablet (70 mg total) by mouth every 7 (seven) days Take on an empty stomach with a full glass of water. Avoid mineral or well water. Do not eat or take other medications for at least 30 minutes after dose. Sit or stand for at least 30 minutes after dose. 4 tablet 11  . apixaban  (ELIQUIS ) 2.5 mg tablet Take 1 tablet (2.5 mg total) by mouth every 12 (twelve) hours 60 tablet 5  . carboxymethylcellulose (REFRESH TEARS) 0.5 % ophthalmic solution Place 1-2 drops into both eyes as needed for Dry Eyes    . esomeprazole (NEXIUM) 20 MG DR capsule Take 20 mg by mouth once daily    . ferrous sulfate  325 (65 FE) MG EC tablet Take 1 tablet (325 mg total) by mouth  daily with breakfast 30 tablet 3  . gabapentin  (NEURONTIN ) 300 MG capsule TAKE 1 CAPSULE (300 MG TOTAL) BY MOUTH 2- 4 (FOUR) TIMES DAILY 360 capsule 3  . isosorbide  mononitrate (IMDUR ) 30 MG ER tablet Take 1 tablet (30 mg total) by mouth 2 (two) times daily 180 tablet 1  . nitroGLYcerin  (NITROSTAT ) 0.4 MG SL tablet Place 1 tablet (0.4 mg total) under the tongue every 5 (five) minutes as needed for Chest pain May take up to 3 doses. 25 tablet 2  . potassium chloride  (KLOR-CON  M10) 10 mEq ER tablet Take 2 tablets (20 mEq total) by mouth once daily as needed (with furosemide )    . predniSONE  (DELTASONE ) 5 MG tablet 2.5 mg with the 10 mg tabs to equal 12.5 mg for 4 weeks then stay on 10 mg/day 90 tablet 1  . rosuvastatin  (CRESTOR ) 10 MG tablet TAKE 1 TABLET BY MOUTH EVERY DAY 90 tablet 3  . silver sulfADIAZINE (SSD) 1 % cream Apply topically once daily 50 g 5  . traMADoL  (ULTRAM ) 50 mg tablet Take 1 tablet (50 mg total) by mouth 2 (two) times daily as needed 1 po bid prn 20 tablet 0  . FUROsemide  (LASIX ) 80 MG tablet Take 1 tablet (80 mg total) by mouth once daily as needed for Edema 90 tablet 3   No current facility-administered medications for this visit.    Allergies as of 03/19/2024 - Reviewed 03/19/2024  Allergen Reaction Noted  . Amiodarone Other (See Comments) 07/11/2012  . Adhesive tape-silicones Rash 12/04/2013  . Baclofen Other (See Comments) 04/06/2019  . Metoprolol  Rash 11/28/2012  . Pantoprazole  Rash 01/01/2013    Past Medical History:  Diagnosis Date  . A-fib (CMS/HHS-HCC)   . Amaurosis fugax of left eye   . Arthritis   . Biventricular cardiac pacemaker in situ 01/11/2020   Medtronic CRT-P.   . Breast cancer (CMS/HHS-HCC)   . CHB (complete heart block) (CMS/HHS-HCC) 06/30/2016  . Chickenpox   . CKD (chronic kidney disease) stage 3, GFR 30-59 ml/min (CMS/HHS-HCC) 12/27/2017  . Coronary atherosclerosis 11/27/2011  . Dysrhythmias 03/24/2012  . Edema 01/23/2012  . Elective  replacement indicated for cardiac pacemaker battery at end of lifespan 01/11/2020   Battery reached ERI on 01/04/20  . Encounter for care of pacemaker 08/15/2020  . Encounter for monitoring sotalol therapy 01/13/2013   Restarted after second RFA 12/2012  . GERD (gastroesophageal reflux disease)   . H/O cardiac arrest 01/01/2013  . H/O non-ST elevation myocardial infarction (NSTEMI) 01/01/2013  . Hearing impairment 01/01/2013  . Heart disease   . Heart disease   . History of MI (myocardial infarction)   . Hyperlipemia 01/23/2012  . Hyperlipidemia   . Hypertension   . Left arm pain 01/13/2013   After Arterial line placement during AF procedure 01/02/13  . Melanoma of lower leg (CMS/HHS-HCC) 2015   Left   . 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 Persistent atrial fibrillation (CMS/HHS-HCC) 11/27/2011   Last Assessment & Plan:  Asma presents with rapid atrial fibrillation. She has a long history of atrial ablation controlled with sotalol in the past but she is not as well controlled. She's tried taking extra doses of sotalol but did not convert. She was seen in the [facility] emergency room this past weekend.  She's not very symptomatically. I would favor anticoagulation with rate control. She's nev  . S/P ablation of atrial fibrillation 07/11/2012  . Severe mitral regurgitation 05/29/2016  . Shingles   . Shortness of breath     Past Surgical History:  Procedure Laterality Date  . Uterine polyps removed  2008  . Left RCT Left 2009  . CHOLECYSTECTOMY  2010  . DILATION AND CURETTAGE OF UTERUS  2010  . CHOLECYSTECTOMY  2011  . ABLATION ARRYTHMIA FOCUS  2013  . EGD  07/28/2012   No repeat needed  . OTHER SURGERY Left 07/2013   Melanoma Removed from Left Lower Leg  . EGD  12/13/2020   Normal EGD biopsies/Repeat as needed/CTL  . Right total knee arthroplasty using computer-assisted navigation  12/25/2021   Dr Mardee  . BREAST SURGERY Right 1990s  .  INSERT / REPLACE / REMOVE PACEMAKER    . TONSILLECTOMY     childhood     Family History  Problem Relation Name Age of Onset  . Diabetes type II Father    . Diabetes Father    . Alcohol  abuse Father    . Liver disease Father    . Breast cancer Mother  52       died 69 not cancer related  . Asthma Mother    . Arthritis Mother    . Breast cancer Daughter  30       died breast cancer at 32 yrs.   . Colon cancer Sister    . Multiple myeloma Sister    . Acute myelogenous leukemia Brother    .  Heart disease Brother    . Melanoma Brother    . Arthritis Brother    . Breast cancer Other niece   . Acute myelogenous leukemia Son    . Arthritis Brother    . Anesthesia problems Neg Hx    . Malignant hyperthermia Neg Hx      Social History   Socioeconomic History  . Marital status: Married    Spouse name: Budd  Occupational History  . Occupation: Retired  Tobacco Use  . Smoking status: Never    Passive exposure: Never  . Smokeless tobacco: Never  Vaping Use  . Vaping status: Never Used  Substance and Sexual Activity  . Alcohol  use: Yes    Alcohol /week: 7.0 standard drinks of alcohol     Types: 7 Shots of liquor per week    Comment: Patient typically consumes 1 drink/day.  . Drug use: No  . Sexual activity: Not Currently    Partners: Male    Birth control/protection: None   Social Drivers of Health   Financial Resource Strain: Low Risk  (02/18/2024)   Overall Financial Resource Strain (CARDIA)   . Difficulty of Paying Living Expenses: Not hard at all  Food Insecurity: No Food Insecurity (03/05/2024)   Received from [facility]   Hunger Vital Sign   . Within the past 12 months, you worried that your food would run out before you got the money to buy more.: Never true   . Within the past 12 months, the food you bought just didn't last and you didn't have money to get more.: Never true  Transportation Needs: No Transportation Needs (03/05/2024)   Received from [facility] - Transportation   . In the past 12 months, has lack of transportation kept you from medical appointments or from getting medications?: No   . In the past 12 months, has lack of transportation kept you from meetings, work, or from getting things needed for daily living?: No     Goals Addressed             This Visit's Progress   . Maintain health/healthy lifestyle   On track          BP 124/86   Pulse 72   Ht 175.3 cm (5' 9)   Wt 65.5 kg (144 lb 6.4 oz)   SpO2 95%   BMI 21.32 kg/m   General.  Thin elderly patient, in no distress HEENT: Mild temporal wasting is again noted Neck. trachea midline.  No thyromegaly Chest: Pacemaker left chest wall.   Lungs.  Few scattered basilar crackles without wheeze or retractions. Cardiovascular.Regular rate and rhythm without gallops, or rubs.  2/6 systolic murmur right lower sternal border, unchanged.   Abdomen. Soft; non tender; non distended; no masses or organomegaly.   Extremities.No clubbing, cyanosis.   Bilateral venous stasis changes bilaterally.  3-4+ edema about two thirds of the way up the lower legs bilaterally.  senile purpura/ecchymoses of the arms and legs.  Healing skin ulcerations on the lateral aspects of both lower extremities.  On the right lateral lower leg is approximately 5 x 4 cm tense blood-filled bulla, nontender  Paroxysmal atrial fibrillation (CMS/HHS-HCC)  (primary encounter diagnosis) CHB (complete heart block) (CMS/HHS-HCC) Meningioma (CMS/HHS-HCC) Primary hypertension Enlarged pulmonary artery (CMS/HHS-HCC) Atherosclerosis of native coronary artery of native heart without angina pectoris Secondary hyperparathyroidism (CMS/HHS-HCC) Prediabetes Acquired thrombophilia (CMS/HHS-HCC) Mild protein-calorie malnutrition (CMS/HHS-HCC) Stage 3b chronic kidney disease (CMS/HHS-HCC) GCA (giant cell arteritis) (CMS/HHS-HCC) History of melanoma  Need for vaccination  Assessment & Plan Edema with lower  extremity blister/fluid overload Persistent lower extremity edema with a large, non-infected blister. Edema worse despite weight reduction. Fluid retention likely contributes to breathing difficulties. Inadequate urine output with current furosemide  dosing suggests possible tolerance. - Increase furosemide  to 80 mg daily for three days, then every other day. If using 40 mg tablets, take two together. - After verbal informed consent was given, the bottom of the blister on the right leg was cleaned sterile solution and minimal local anesthesia was given without chloride approximately 1 cm incision was made in the bottom of the blister with copious amounts of serosanguineous fluid removed this, without fluctuance.  Patient tolerated procedure well with excellent decompression of the blister.  Dressing was applied - Advise leg elevation and consider compression socks if tolerable.  Stage 3b chronic kidney disease with secondary hyperparathyroidism Stage 3b chronic kidney disease with secondary hyperparathyroidism. Current management includes monitoring kidney function and balancing fluid management to avoid further kidney damage. Advised to limit sodium intake to help manage fluid retention. - Monitor kidney function with lab tests today. - Adjust furosemide  dosing to avoid excessive dehydration and potential kidney damage. - Advise on reducing sodium intake.  Rechecking renal function today.  Paroxysmal atrial fibrillation No new symptoms are reported.  On anticoagulation and is aware of risk of recurrent bleeding.  Has not had any further.  Follow electrolytes, blood counts, thyroid function  Atherosclerotic heart disease of native coronary artery/dyslipidemia.  No new cardiovascular symptoms are reported.  Cholesterol being followed.  Not greatly restricting calorie intake secondary to her protein/calorie malnutrition  Essential hypertension.  This remains reasonably controlled; watching for  hypotension is more aggressive diuresis is attempted.  Mild to moderate protein-calorie malnutrition.  Her weight is down slightly but she had significant fluid overload so difficult to determine whether her oral intake is not adequate or not.  Continue with electrolytes and protein level.  She will try to follow her weight at home  Giant cell arteritis.  No new symptoms.  Follow inflammatory markers periodically.  Follow-up with rheumatology as scheduled or needed  Follow-up in the next 2 weeks to reassess, return sooner if needed   BERT JACK KLEIN III, MD  Portions of this note were created using dictation software and may contain typographical errors.    This note has been created using automated tools and reviewed for accuracy by BERT JACK KLEIN III.  Use of artificial intelligence tool (ABRIDGE) to help with documentation was discussed with patient.

## 2024-03-21 ENCOUNTER — Emergency Department
Admission: EM | Admit: 2024-03-21 | Discharge: 2024-03-21 | Disposition: A | Discharge status: Home / Self Care | Attending: Emergency Medicine | Admitting: Emergency Medicine

## 2024-03-21 ENCOUNTER — Emergency Department

## 2024-03-21 DIAGNOSIS — I130 Hypertensive heart and chronic kidney disease with heart failure and stage 1 through stage 4 chronic kidney disease, or unspecified chronic kidney disease: Secondary | ICD-10-CM | POA: Diagnosis not present

## 2024-03-21 DIAGNOSIS — R339 Retention of urine, unspecified: Secondary | ICD-10-CM | POA: Diagnosis present

## 2024-03-21 DIAGNOSIS — I509 Heart failure, unspecified: Secondary | ICD-10-CM | POA: Insufficient documentation

## 2024-03-21 DIAGNOSIS — R600 Localized edema: Secondary | ICD-10-CM | POA: Insufficient documentation

## 2024-03-21 DIAGNOSIS — R338 Other retention of urine: Secondary | ICD-10-CM

## 2024-03-21 DIAGNOSIS — N189 Chronic kidney disease, unspecified: Secondary | ICD-10-CM | POA: Diagnosis not present

## 2024-03-21 DIAGNOSIS — I2510 Atherosclerotic heart disease of native coronary artery without angina pectoris: Secondary | ICD-10-CM | POA: Diagnosis not present

## 2024-03-21 LAB — BASIC METABOLIC PANEL WITH GFR
Anion gap: 11 (ref 5–15)
BUN: 21 mg/dL (ref 8–23)
CO2: 29 mmol/L (ref 22–32)
Calcium: 9.2 mg/dL (ref 8.9–10.3)
Chloride: 99 mmol/L (ref 98–111)
Creatinine, Ser: 1.18 mg/dL — ABNORMAL HIGH (ref 0.44–1.00)
GFR, Estimated: 43 mL/min — ABNORMAL LOW (ref >60)
Glucose, Bld: 141 mg/dL — ABNORMAL HIGH (ref 70–99)
Potassium: 4.2 mmol/L (ref 3.5–5.1)
Sodium: 139 mmol/L (ref 135–145)

## 2024-03-21 LAB — URINALYSIS, ROUTINE W REFLEX MICROSCOPIC
Bilirubin Urine: NEGATIVE
Glucose, UA: 50 mg/dL — AB
Hgb urine dipstick: NEGATIVE
Ketones, ur: NEGATIVE mg/dL
Leukocytes,Ua: NEGATIVE
Nitrite: NEGATIVE
Protein, ur: NEGATIVE mg/dL
Specific Gravity, Urine: 1.008 (ref 1.005–1.030)
pH: 5 (ref 5.0–8.0)

## 2024-03-21 LAB — CBC
HCT: 30.9 % — ABNORMAL LOW (ref 36.0–46.0)
Hemoglobin: 9.2 g/dL — ABNORMAL LOW (ref 12.0–15.0)
MCH: 23.1 pg — ABNORMAL LOW (ref 26.0–34.0)
MCHC: 29.8 g/dL — ABNORMAL LOW (ref 30.0–36.0)
MCV: 77.4 fL — ABNORMAL LOW (ref 80.0–100.0)
Platelets: 247 K/uL (ref 150–400)
RBC: 3.99 MIL/uL (ref 3.87–5.11)
RDW: 19.3 % — ABNORMAL HIGH (ref 11.5–15.5)
WBC: 10.8 K/uL — ABNORMAL HIGH (ref 4.0–10.5)
nRBC: 0 % (ref 0.0–0.2)

## 2024-03-21 LAB — BRAIN NATRIURETIC PEPTIDE: B Natriuretic Peptide: 130.7 pg/mL — ABNORMAL HIGH (ref 0.0–100.0)

## 2024-03-21 MED ORDER — BARD URETHRAL CATHETER TRAY KIT: 1.0000 [IU] | PACK | Freq: Once | 0 refills | Status: AC

## 2024-03-21 NOTE — ED Triage Notes (Signed)
 Pt c/o urinary retention x2 days and generalized edema since being discharged from the hospital.  Pt reports taking 80mg  Lasix  each day the past 2 days.

## 2024-03-21 NOTE — ED Notes (Signed)
 First Nurse Note: Pt to ED via [facility] for urinary retention. Per [facility] staff pt took 80 mg of Lasix  yesterday and today and has not urinated. Pt is in NAD.

## 2024-03-21 NOTE — ED Provider Notes (Signed)
 [facility] Provider Note   Event Date/Time   First MD Initiated Contact with Patient 03/21/24 1459     (approximate) History  Urinary Retention and Extremity Swelling  HPI Charlotte Glover is a 89 y.o. female with a past medical history of atrial fibrillation, CAD, heart failure, CKD, hypertension, and hyperlipidemia who presents complaining of persistent edema to bilateral lower extremities as well as the inability to urinate.  Patient states that she feels as though she needs to go but is unable to initiate a stream.  Patient states that she had similar symptoms when she was recently admitted to the hospital for an diverticulitis with an acute kidney injury.  Patient received heavy IV fluids in the hospital per family and noted improvement of her creatinine.  Patient states that she has had swelling in bilateral lower extremities since this fluid and was told by her primary care physician, Dr. Fernande, to take her 80 mg Lasix  both in the morning instead of twice daily.  Patient states that she has done this yesterday and today with no improvement of her bilateral lower extremity edema.  Patient also endorses mild shortness of breath and dyspnea on exertion ROS: Patient currently denies any vision changes, tinnitus, difficulty speaking, facial droop, sore throat, chest pain, abdominal pain, nausea/vomiting/diarrhea, dysuria, or weakness/numbness/paresthesias in any extremity   Physical Exam  Triage Vital Signs: ED Triage Vitals [03/21/24 1203]  Encounter Vitals Group     BP (!) 172/96     Girls Systolic BP Percentile      Girls Diastolic BP Percentile      Boys Systolic BP Percentile      Boys Diastolic BP Percentile      Pulse Rate 72     Resp 18     Temp 98.1 F (36.7 C)     Temp Source Oral     SpO2 94 %     Weight 145 lb (65.8 kg)     Height 5' 9 (1.753 m)     Head Circumference      Peak Flow      Pain Score 0     Pain Loc      Pain Education       Exclude from Growth Chart    Most recent vital signs: Vitals:   03/21/24 1500 03/21/24 1542  BP: (!) 153/65 127/73  Pulse: 70 70  Resp:  19  Temp:    SpO2: 97% 99%   General: Awake, oriented x4. CV:  Good peripheral perfusion. Resp:  Normal effort.  CTAB Abd:  No distention. Other:  Elderly well-developed, well-nourished Caucasian female resting comfortably in no acute distress.  Bilateral lower extremity edema ED Results / Procedures / Treatments  Labs (all labs ordered are listed, but only abnormal results are displayed) Labs Reviewed  URINALYSIS, ROUTINE W REFLEX MICROSCOPIC - Abnormal; Notable for the following components:      Result Value   Color, Urine YELLOW (*)    APPearance CLEAR (*)    Glucose, UA 50 (*)    All other components within normal limits  BASIC METABOLIC PANEL WITH GFR - Abnormal; Notable for the following components:   Glucose, Bld 141 (*)    Creatinine, Ser 1.18 (*)    GFR, Estimated 43 (*)    All other components within normal limits  CBC - Abnormal; Notable for the following components:   WBC 10.8 (*)    Hemoglobin 9.2 (*)    HCT 30.9 (*)  MCV 77.4 (*)    MCH 23.1 (*)    MCHC 29.8 (*)    RDW 19.3 (*)    All other components within normal limits  BRAIN NATRIURETIC PEPTIDE - Abnormal; Notable for the following components:   B Natriuretic Peptide 130.7 (*)    All other components within normal limits   EKG ED ECG REPORT I, Artist MARLA Kerns, the attending physician, personally viewed and interpreted this ECG. Date: 03/21/2024 EKG Time: 1122 Rate: 70 Rhythm: Atrial flutter QRS Axis: normal Intervals: normal ST/T Wave abnormalities: normal Narrative Interpretation: Atrial flutter.  No evidence of acute ischemia RADIOLOGY ED MD interpretation: Syncope chest x-ray shows chronic appearing increased interstitial lung markings with superimposed mild to moderate severity bilateral upper lobe and bibasilar atelectasis and/or infiltrate with a  small left pleural effusion - All radiology independently interpreted and agree with radiology assessment Official radiology report(s): DG Chest Port 1 View Result Date: 03/21/2024 CLINICAL DATA:  Urinary retention. EXAM: PORTABLE CHEST 1 VIEW COMPARISON:  July 02, 2023 FINDINGS: There is stable multi lead AICD positioning. The cardiac silhouette is mildly enlarged and unchanged in size. Mild, diffusely increased interstitial lung markings are seen which is mildly increased in severity when compared to the prior study. Mild to moderate severity superimposed areas of atelectasis and/or infiltrate are seen within the bilateral upper lung fields and bilateral lung bases, left greater than right. There is a small left pleural effusion. No pneumothorax is identified. Degenerative changes are seen involving both shoulders and throughout the thoracic spine. IMPRESSION: 1. Chronic appearing increased interstitial lung markings with superimposed mild to moderate severity bilateral upper lobe and bibasilar atelectasis and/or infiltrate. 2. Small left pleural effusion. Electronically Signed   By: Suzen Dials M.D.   On: 03/21/2024 16:51   PROCEDURES: Critical Care performed: No Procedures MEDICATIONS ORDERED IN ED: Medications - No data to display IMPRESSION / MDM / ASSESSMENT AND PLAN / ED COURSE  I reviewed the triage vital signs and the nursing notes.                             The patient is on the cardiac monitor to evaluate for evidence of arrhythmia and/or significant heart rate changes. Patient's presentation is most consistent with acute presentation with potential threat to life or bodily function. Patient is a 89 year old female with above-stated past medical history who presents for persistent bilateral lower extremity edema since leaving the hospital.  Patient has not increased her Lasix  dosing and has only changed how she is taking it. DDx: AKI/ARF, ACS, CHF exacerbation,  glomerulonephritis Plan: CBC, CMP, UA, chest x-ray In-N-Out catheterization for urine which was 755 cc  I spoke with patient at length regarding further care of her urinary retention.  I offered patient Foley catheter however she refused and request to be discharged.  I did offer patient a prescription for In-N-Out catheters at home and patient states that she would use them as needed.  I also instructed patient to restart her metolazone  tomorrow morning in addition to continuing her Lasix .  Patient expresses understanding and agrees with plan for discharge at this time and follow-up with Dr. Fernande in the next 1-3 days for further evaluation and management of her diuretics.  Dispo: Discharge home with PCP follow-up   FINAL CLINICAL IMPRESSION(S) / ED DIAGNOSES   Final diagnoses:  Acute urinary retention  Bilateral lower extremity edema   Rx / DC Orders   ED  Discharge Orders          Ordered    Catheters (BARD URETHRAL CATHETER TRAY) KIT   Once        03/21/24 1711           Note:  This document was prepared using Dragon voice recognition software and may include unintentional dictation errors.   Jossie Artist POUR, MD 03/21/24 [id number]

## 2024-03-21 NOTE — Discharge Instructions (Addendum)
 Please resume taking your metolazone .  Please take the first dose tomorrow morning.

## 2024-03-25 ENCOUNTER — Ambulatory Visit: Admitting: Physician Assistant

## 2024-04-09 ENCOUNTER — Encounter: Admitting: Student in an Organized Health Care Education/Training Program

## 2024-04-16 ENCOUNTER — Encounter: Admitting: Student in an Organized Health Care Education/Training Program

## 2024-04-21 ENCOUNTER — Telehealth: Payer: Self-pay | Admitting: Student in an Organized Health Care Education/Training Program

## 2024-04-21 NOTE — Telephone Encounter (Signed)
 PT son called stating she has an upcoming appt on the 05/12/2024. PT needs meds if available that she could take now that is fast acting. PT son stated that the tramadol  takes up to 3 hours to start working.

## 2024-04-21 NOTE — Telephone Encounter (Signed)
 Message left with Charlena, informing him that no medications are prescribed outside of an appt.  I suggested he call the office to ask if there are any sooner appointments.

## 2024-04-30 ENCOUNTER — Ambulatory Visit
Attending: Student in an Organized Health Care Education/Training Program | Admitting: Student in an Organized Health Care Education/Training Program

## 2024-04-30 ENCOUNTER — Encounter: Payer: Self-pay | Admitting: Student in an Organized Health Care Education/Training Program

## 2024-04-30 VITALS — BP 118/44 | HR 70 | Temp 97.3°F | Ht 69.0 in | Wt 130.0 lb

## 2024-04-30 DIAGNOSIS — G894 Chronic pain syndrome: Secondary | ICD-10-CM | POA: Diagnosis present

## 2024-04-30 DIAGNOSIS — M47816 Spondylosis without myelopathy or radiculopathy, lumbar region: Secondary | ICD-10-CM | POA: Insufficient documentation

## 2024-04-30 DIAGNOSIS — M5136 Other intervertebral disc degeneration, lumbar region with discogenic back pain only: Secondary | ICD-10-CM | POA: Diagnosis present

## 2024-04-30 MED ORDER — HYDROCODONE-ACETAMINOPHEN 5-325 MG PO TABS: 1.0000 | ORAL_TABLET | Freq: Two times a day (BID) | ORAL | 0 refills | Status: AC | PRN

## 2024-04-30 NOTE — Progress Notes (Signed)
 PROVIDER NOTE: Interpretation of information contained herein should be left to medically-trained personnel. Specific patient instructions are provided elsewhere under Patient Instructions section of medical record. This document was created in part using AI and STT-dictation technology, any transcriptional errors that may result from this process are unintentional.  Patient: Charlotte Glover  Service: E/M   PCP: Fernande Ophelia JINNY DOUGLAS, MD  DOB: 1928-01-11  DOS: 04/30/2024  Provider: Wallie Sherry, MD  MRN: [id number]  Delivery: Face-to-face  Specialty: Interventional Pain Management  Type: Established Patient  Setting: Ambulatory outpatient facility  Specialty designation: 09  Referring Prov.: Fernande Ophelia JINNY DOUGLAS, MD  Location: Outpatient office facility       History of present illness (HPI) Ms. Charlotte Glover, a 89 y.o. year old female, is here today because of her Lumbar facet arthropathy [M47.816]. Ms. Charlotte Glover primary complain today is Back Pain (lower)  Pain Assessment: Severity of   is reported as a 3/10. Location:    / . Onset:  . Quality:  . Timing:  . Modifying factor(s):  SABRA Vitals:  height is 5' 9 (1.753 m) and weight is 130 lb (59 kg). Her temperature is 97.3 F (36.3 C) (abnormal). Her blood pressure is 118/44 (abnormal) and her pulse is 70. Her oxygen saturation is 100%.  BMI: Estimated body mass index is 19.2 kg/m as calculated from the following:   Height as of this encounter: 5' 9 (1.753 m).   Weight as of this encounter: 130 lb (59 kg).  Last encounter: 02/11/2024. Last procedure: 01/22/2024.  Reason for encounter: medication management.   Discussed the use of AI scribe software for clinical note transcription with the patient, who gave verbal consent to proceed.  History of Present Illness   Charlotte Glover is a 89 year old female who presents with chronic pain management issues.  She has been experiencing chronic pain for several months,  characterized by waves of pain without a clear correlation to external factors such as weather or activity. Her pain levels have improved over the last couple of days, allowing her to go without tramadol , which she normally takes daily. Previously, tramadol  would take four to five hours to provide relief during months of pain.  She has experienced constipation, which has improved since she stopped taking tramadol  for a couple of days.   She is requesting an analgesic with quicker onset of action when she does have severe pain.  We discussed transitioning to hydrocodone .      Transitioning from tramadol  to hydrocodone  Monitoring: [place] PMP: PDMP reviewed during this encounter.       Pharmacotherapy: No side-effects or adverse reactions reported. Compliance: No problems identified. Effectiveness: Clinically acceptable.  Charlotte Dorothe LABOR, RN  04/30/2024 10:19 AM  Sign when Signing Visit Nursing Pain Medication Assessment:  Safety precautions to be maintained throughout the outpatient stay will include: orient to surroundings, keep bed in low position, maintain call bell within reach at all times, provide assistance with transfer out of bed and ambulation.  Medication Inspection Compliance: Pill count conducted under aseptic conditions, in front of the patient. Neither the pills nor the bottle was removed from the patient's sight at any time. Once count was completed pills were immediately returned to the patient in their original bottle.  Medication: Tramadol  (Ultram ) Pill/Patch Count: 41 of 60 pills/patches remain Pill/Patch Appearance: Markings consistent with prescribed medication Bottle Appearance: Standard pharmacy container. Clearly labeled. Filled Date: 8 / 6 / 2025 Last Medication intake:  3 days  ago Safety precautions to be maintained throughout the outpatient stay will include: orient to surroundings, keep bed in low position, maintain call bell within reach at all times, provide  assistance with transfer out of bed and ambulation.   UDS:  Summary  Date Value Ref Range Status  12/31/2023 FINAL  Final    Comment:    ==================================================================== Compliance Drug Analysis, Ur ==================================================================== Test                             Result       Flag       Units  Drug Present and Declared for Prescription Verification   Tramadol                        >7463        EXPECTED   ng/mg creat   O-Desmethyltramadol            >7463        EXPECTED   ng/mg creat   N-Desmethyltramadol            2158         EXPECTED   ng/mg creat    Source of tramadol  is a prescription medication. O-desmethyltramadol    and N-desmethyltramadol are expected metabolites of tramadol .    Gabapentin                      PRESENT      EXPECTED   Acetaminophen                   PRESENT      EXPECTED  Drug Present not Declared for Prescription Verification   Carboxy-THC                    39           UNEXPECTED ng/mg creat    Carboxy-THC is a metabolite of tetrahydrocannabinol (THC). Source of    THC is most commonly herbal marijuana or marijuana-based products,    but THC is also present in a scheduled prescription medication.    Trace amounts of THC can be present in hemp and cannabidiol (CBD)    products. This test is not intended to distinguish between delta-9-    tetrahydrocannabinol, the predominant form of THC in most herbal or    marijuana-based products, and delta-8-tetrahydrocannabinol.    Diphenhydramine                 PRESENT      UNEXPECTED ==================================================================== Test                      Result    Flag   Units      Ref Range   Creatinine              67               mg/dL      >=79 ==================================================================== Declared Medications:  The flagging and interpretation on this report are based on the  following  declared medications.  Unexpected results may arise from  inaccuracies in the declared medications.   **Note: The testing scope of this panel includes these medications:   Gabapentin   Tramadol  (Ultram )   **Note: The testing scope of this panel does not include small to  moderate amounts of these reported medications:   Acetaminophen  (Tylenol )   **Note:  The testing scope of this panel does not include the  following reported medications:   Albuterol  (Ventolin  HFA)  Alendronate (Fosamax)  Esomeprazole (Nexium)  Eye Drops  Furosemide  (Lasix )  Ipratropium (Atrovent )  Isosorbide  (Imdur )  Metolazone  (Zaroxolyn )  Nitroglycerin  (Nitrostat )  Potassium Chloride   Prednisone  (Deltasone )  Rosuvastatin  (Crestor )  Sennosides (Senokot)  Spironolactone  (Aldactone )  Supplement  Triamcinolone (Kenalog) ==================================================================== For clinical consultation, please call [phone]. ====================================================================     No results found for: CBDTHCR No results found for: D8THCCBX No results found for: D9THCCBX  ROS  Constitutional: Denies any fever or chills Gastrointestinal: No reported hemesis, hematochezia, vomiting, or acute GI distress Musculoskeletal: Low back pain Neurological: No reported episodes of acute onset apraxia, aphasia, dysarthria, agnosia, amnesia, paralysis, loss of coordination, or loss of consciousness  Medication Review  AeroChamber MV, HYDROcodone -acetaminophen , Polyvinyl Alcohol -Povidone, PreserVision AREDS 2, acetaminophen , albuterol , alendronate, apixaban , esomeprazole, feeding supplement, furosemide , gabapentin , ipratropium, isosorbide  mononitrate, metolazone , nitroGLYCERIN , potassium chloride , predniSONE , rosuvastatin , senna, spironolactone , traMADol , and triamcinolone ointment  History Review  Allergy: Ms. Rhinehart is allergic to amiodarone, baclofen, metoprolol ,  pantoprazole , silicone, and tape. Drug: Ms. Tomey  reports no history of drug use. Alcohol :  reports current alcohol  use of about 7.0 standard drinks of alcohol  per week. Tobacco:  reports that she has never smoked. She has never used smokeless tobacco. Social: Ms. Creed  reports that she has never smoked. She has never used smokeless tobacco. She reports current alcohol  use of about 7.0 standard drinks of alcohol  per week. She reports that she does not use drugs. Medical:  has a past medical history of (HFpEF) heart failure with preserved ejection fraction (HCC) (06/21/2020), Amaurosis fugax of left eye, Arthritis, Atrial fibrillation (HCC), Cardiac arrest (HCC) (10/2009), Cardiac murmur, CHB (complete heart block) (HCC), Chickenpox, CKD (chronic kidney disease), stage III (HCC), Coronary artery disease, Ductal carcinoma in situ (DCIS) of right breast (07/13/2013), Dyslipidemia (09/15/2023), Dyspnea, Edema, GERD (gastroesophageal reflux disease), Granulomatous disease, Hiatal hernia, HLD (hyperlipidemia), HOH (hard of hearing), Hypertension (01/23/2012), Jackhammer esophagus, Long term current use of antithrombotics/antiplatelets, Melanoma of lower leg, left (HCC), Meningioma, multiple (HCC) (07/14/2019), NSTEMI (non-ST elevated myocardial infarction) (HCC) (08/2009), Presence of permanent cardiac pacemaker, Pulmonary HTN (HCC) (05/08/2016), S/P ablation of atrial fibrillation, Shingles, SSS (sick sinus syndrome) (HCC), Valvular heart disease (02/08/2012), and Wears hearing aid in both ears. Surgical: Ms. Moors  has a past surgical history that includes Rotator cuff repair (Left); Cholecystectomy; Pacemaker insertion (N/A, 05/28/2014); Tonsillectomy; Breast surgery; Dilation and curettage of uterus; uterine polyp; Esophagogastroduodenoscopy; Cardiac catheterization (Bilateral, 05/08/2016); TEE without cardioversion (N/A, 05/16/2016); Anterior vitrectomy (Left, 03/20/2017); Cataract extraction w/PHACO (Left,  03/20/2017); Peripheral iridotomy (Left, 03/20/2017); Breast biopsy (Right, 2015); Artery Biopsy (Right, 04/08/2019); Esophagogastroduodenoscopy (egd) with propofol  (N/A, 12/13/2020); Colonoscopy; Atrial fibrillation ablation (N/A, 03/24/2012); Cardioversion (N/A, 04/02/2012); Atrial fibrillation ablation (N/A, 01/02/2013); Cardioversion (N/A, 01/15/2013); AV node ablation (N/A, 05/26/2014); BIV PACEMAKER INSERTION CRT-P (N/A, 07/13/2016); Knee Arthroplasty (Right, 12/25/2021); and Colonoscopy (N/A, 09/11/2023). Family: family history includes Breast cancer in her daughter and mother; Cancer in her brother, daughter, sister, and son.  Laboratory Chemistry Profile   Renal Lab Results  Component Value Date   BUN 21 03/21/2024   CREATININE 1.18 (H) 03/21/2024   GFRAA 50 (L) 04/08/2019   GFRNONAA 43 (L) 03/21/2024    Hepatic Lab Results  Component Value Date   AST 22 03/05/2024   ALT 14 03/05/2024   ALBUMIN 3.6 03/05/2024   ALKPHOS 35 (L) 03/05/2024   LIPASE 39 03/05/2024    Electrolytes Lab Results  Component Value Date   NA 139 03/21/2024   K 4.2 03/21/2024   CL 99 03/21/2024   CALCIUM  9.2 03/21/2024   MG 2.1 03/07/2024    Bone No results found for: VD25OH, [id number], CI6874NY7, CI7874NY7, 25OHVITD1, 25OHVITD2, 25OHVITD3, TESTOFREE, TESTOSTERONE  Inflammation (CRP: Acute Phase) (ESR: Chronic Phase) Lab Results  Component Value Date   CRP 1.9 (H) 12/13/2021   ESRSEDRATE 46 (H) 12/13/2021         Note: Above Lab results reviewed.  Recent Imaging Review  DG Chest Port 1 View CLINICAL DATA:  Urinary retention.  EXAM: PORTABLE CHEST 1 VIEW  COMPARISON:  July 02, 2023  FINDINGS: There is stable multi lead AICD positioning. The cardiac silhouette is mildly enlarged and unchanged in size. Mild, diffusely increased interstitial lung markings are seen which is mildly increased in severity when compared to the prior study. Mild to moderate  severity superimposed areas of atelectasis and/or infiltrate are seen within the bilateral upper lung fields and bilateral lung bases, left greater than right. There is a small left pleural effusion. No pneumothorax is identified. Degenerative changes are seen involving both shoulders and throughout the thoracic spine.  IMPRESSION: 1. Chronic appearing increased interstitial lung markings with superimposed mild to moderate severity bilateral upper lobe and bibasilar atelectasis and/or infiltrate. 2. Small left pleural effusion.  Electronically Signed   By: Suzen Dials M.D.   On: 03/21/2024 16:51 Note: Reviewed        Physical Exam  Vitals: BP (!) 118/44   Pulse 70   Temp (!) 97.3 F (36.3 C)   Ht 5' 9 (1.753 m)   Wt 130 lb (59 kg)   SpO2 100%   BMI 19.20 kg/m  BMI: Estimated body mass index is 19.2 kg/m as calculated from the following:   Height as of this encounter: 5' 9 (1.753 m).   Weight as of this encounter: 130 lb (59 kg). Ideal: Ideal body weight: 66.2 kg (145 lb 15.1 oz) General appearance: Well nourished, well developed, and well hydrated. In no apparent acute distress Mental status: Alert, oriented x 3 (person, place, & time)       Respiratory: No evidence of acute respiratory distress Eyes: PERLA   Assessment   Diagnosis Status  1. Lumbar facet arthropathy   2. Lumbar spondylosis   3. Degeneration of intervertebral disc of lumbar region with discogenic back pain   4. Chronic pain syndrome    Controlled Controlled Controlled   Updated Problems: No problems updated.  Plan of Care  Problem-specific:  Assessment and Plan    Chronic lumbar spondylosis and lumbar intervertebral disc degeneration with discogenic pain   Chronic lumbar spondylosis and intervertebral disc degeneration cause intermittent discogenic pain, with periods of relief and exacerbation. Tramadol  has become less effective for pain management. Hydrocodone  is considered for more  immediate relief, with a discussion on its potential to increase constipation. Prescribe hydrocodone  5 mg tablets, to be taken up to twice daily as needed. Advise taking Miralax  or a stool softener preemptively if hydrocodone  is used for severe pain. Continue the current bowel regimen to manage constipation. Send prescription to the pharmacy on [address] for a two-month supply.  Chronic pain syndrome   Chronic pain syndrome is managed with tramadol . Tramadol 's effectiveness has decreased, and CBD gummies have not provided sufficient relief. Hydrocodone  is considered for more effective pain management, with a discussion on its potential to be more constipating than tramadol . Prescribe hydrocodone  as outlined for chronic pain management. Avoid  CBD gummies while on Hydrocodone .      Ms. Charlotte Glover has a current medication list which includes the following long-term medication(s): eliquis , gabapentin , isosorbide  mononitrate, metolazone , nitroglycerin , rosuvastatin , spironolactone , albuterol , and ipratropium.  Pharmacotherapy (Medications Ordered): Meds ordered this encounter  Medications   HYDROcodone -acetaminophen  (NORCO/VICODIN) 5-325 MG tablet    Sig: Take 1 tablet by mouth every 12 (twelve) hours as needed for severe pain (pain score 7-10). Must last 30 days    Dispense:  60 tablet    Refill:  0    Chronic Pain: STOP Act (Not applicable) Fill 1 day early if closed on refill date. Avoid benzodiazepines within 8 hours of opioids   HYDROcodone -acetaminophen  (NORCO/VICODIN) 5-325 MG tablet    Sig: Take 1 tablet by mouth every 12 (twelve) hours as needed for severe pain (pain score 7-10). Must last 30 days    Dispense:  60 tablet    Refill:  0    Chronic Pain: STOP Act (Not applicable) Fill 1 day early if closed on refill date. Avoid benzodiazepines within 8 hours of opioids   Orders:  No orders of the defined types were placed in this encounter.    Bilateral L3, L4, L5 medial  branch nerve blocks 01/22/2024    Return in about 8 weeks (around 06/25/2024) for Seema, MM, F2F.    Recent Visits Date Type Provider Dept  02/11/24 Office Visit Marcelino Nurse, MD [facility]  Showing recent visits within past 90 days and meeting all other requirements Today's Visits Date Type Provider Dept  04/30/24 Office Visit Marcelino Nurse, MD [facility]  Showing today's visits and meeting all other requirements Future Appointments Date Type Provider Dept  06/29/24 Appointment Patel, Seema K, NP [facility]  Showing future appointments within next 90 days and meeting all other requirements  I discussed the assessment and treatment plan with the patient. The patient was provided an opportunity to ask questions and all were answered. The patient agreed with the plan and demonstrated an understanding of the instructions.  Patient advised to call back or seek an in-person evaluation if the symptoms or condition worsens.  I personally spent a total of 30 minutes in the care of the patient today including preparing to see the patient, getting/reviewing separately obtained history, performing a medically appropriate exam/evaluation, counseling and educating, placing orders, and documenting clinical information in the EHR.   Note by: Nurse Marcelino, MD (TTS and AI technology used. I apologize for any typographical errors that were not detected and corrected.) Date: 04/30/2024; Time: 11:12 AM

## 2024-04-30 NOTE — Progress Notes (Signed)
 Nursing Pain Medication Assessment:  Safety precautions to be maintained throughout the outpatient stay will include: orient to surroundings, keep bed in low position, maintain call bell within reach at all times, provide assistance with transfer out of bed and ambulation.  Medication Inspection Compliance: Pill count conducted under aseptic conditions, in front of the patient. Neither the pills nor the bottle was removed from the patient's sight at any time. Once count was completed pills were immediately returned to the patient in their original bottle.  Medication: Tramadol  (Ultram ) Pill/Patch Count: 41 of 60 pills/patches remain Pill/Patch Appearance: Markings consistent with prescribed medication Bottle Appearance: Standard pharmacy container. Clearly labeled. Filled Date: 8 / 6 / 2025 Last Medication intake:  3 days ago Safety precautions to be maintained throughout the outpatient stay will include: orient to surroundings, keep bed in low position, maintain call bell within reach at all times, provide assistance with transfer out of bed and ambulation.

## 2024-05-04 ENCOUNTER — Encounter: Payer: Self-pay | Admitting: Student in an Organized Health Care Education/Training Program

## 2024-05-12 ENCOUNTER — Encounter: Admitting: Student in an Organized Health Care Education/Training Program

## 2024-06-03 ENCOUNTER — Telehealth: Payer: Self-pay | Admitting: Nurse Practitioner

## 2024-06-03 NOTE — Telephone Encounter (Signed)
 Called pharmacy and patient does not need PA to pickup medication

## 2024-06-03 NOTE — Telephone Encounter (Signed)
 Pharmacy told patient she needs PA for Hydrocodone . Please verity and let patient know. Thanks.

## 2024-06-26 ENCOUNTER — Encounter: Payer: Self-pay | Admitting: Student in an Organized Health Care Education/Training Program

## 2024-06-29 ENCOUNTER — Encounter: Admitting: Nurse Practitioner
